# Patient Record
Sex: Female | Born: 1935 | Race: White | Hispanic: No
Health system: Southern US, Community
[De-identification: ages and names within clinical notes are randomized; demographics above are authoritative.]

## PROBLEM LIST (undated history)

## (undated) DIAGNOSIS — I4891 Unspecified atrial fibrillation: Secondary | ICD-10-CM

## (undated) DIAGNOSIS — R7309 Other abnormal glucose: Secondary | ICD-10-CM

## (undated) DIAGNOSIS — Q249 Congenital malformation of heart, unspecified: Secondary | ICD-10-CM

## (undated) DIAGNOSIS — I10 Essential (primary) hypertension: Secondary | ICD-10-CM

## (undated) DIAGNOSIS — Z9289 Personal history of other medical treatment: Secondary | ICD-10-CM

## (undated) DIAGNOSIS — C801 Malignant (primary) neoplasm, unspecified: Secondary | ICD-10-CM

## (undated) DIAGNOSIS — E871 Hypo-osmolality and hyponatremia: Secondary | ICD-10-CM

## (undated) DIAGNOSIS — M1990 Unspecified osteoarthritis, unspecified site: Secondary | ICD-10-CM

## (undated) DIAGNOSIS — D649 Anemia, unspecified: Secondary | ICD-10-CM

## (undated) DIAGNOSIS — H3530 Unspecified macular degeneration: Secondary | ICD-10-CM

## (undated) HISTORY — DX: Hypo-osmolality and hyponatremia: E87.1

## (undated) HISTORY — PX: FRACTURE SURGERY: SHX138

## (undated) HISTORY — DX: Malignant (primary) neoplasm, unspecified: C80.1

## (undated) HISTORY — DX: Other abnormal glucose: R73.09

## (undated) HISTORY — DX: Unspecified osteoarthritis, unspecified site: M19.90

## (undated) HISTORY — PX: APPENDECTOMY: SHX54

## (undated) HISTORY — PX: BLADDER TUMOR EXCISION: SHX238

## (undated) HISTORY — DX: Unspecified atrial fibrillation: I48.91

## (undated) HISTORY — DX: Essential (primary) hypertension: I10

## (undated) HISTORY — PX: TONSILLECTOMY: SUR1361

## (undated) HISTORY — PX: DILATION AND CURETTAGE OF UTERUS: SHX78

## (undated) HISTORY — PX: ABDOMINAL HYSTERECTOMY: SHX81

## (undated) HISTORY — PX: COLONOSCOPY: SHX174

## (undated) HISTORY — PX: CYSTOSCOPY: SUR368

## (undated) HISTORY — DX: Congenital malformation of heart, unspecified: Q24.9

---

## 1999-05-12 ENCOUNTER — Ambulatory Visit (HOSPITAL_COMMUNITY): Admission: RE | Admit: 1999-05-12 | Discharge: 1999-05-12 | Payer: Self-pay | Admitting: Internal Medicine

## 1999-05-12 ENCOUNTER — Encounter: Payer: Self-pay | Admitting: Internal Medicine

## 1999-05-19 ENCOUNTER — Encounter: Payer: Self-pay | Admitting: Internal Medicine

## 1999-05-19 ENCOUNTER — Ambulatory Visit (HOSPITAL_COMMUNITY): Admission: RE | Admit: 1999-05-19 | Discharge: 1999-05-19 | Payer: Self-pay | Admitting: Internal Medicine

## 2000-02-11 ENCOUNTER — Ambulatory Visit (HOSPITAL_COMMUNITY): Admission: RE | Admit: 2000-02-11 | Discharge: 2000-02-11 | Payer: Self-pay | Admitting: Orthopedic Surgery

## 2000-02-11 ENCOUNTER — Encounter: Payer: Self-pay | Admitting: Orthopedic Surgery

## 2002-11-21 ENCOUNTER — Encounter: Payer: Self-pay | Admitting: Internal Medicine

## 2002-11-21 ENCOUNTER — Encounter: Admission: RE | Admit: 2002-11-21 | Discharge: 2002-11-21 | Payer: Self-pay | Admitting: Internal Medicine

## 2004-09-03 ENCOUNTER — Ambulatory Visit: Payer: Self-pay | Admitting: Internal Medicine

## 2004-11-22 ENCOUNTER — Encounter: Admission: RE | Admit: 2004-11-22 | Discharge: 2004-11-22 | Payer: Self-pay | Admitting: Internal Medicine

## 2005-11-28 ENCOUNTER — Ambulatory Visit: Payer: Self-pay | Admitting: Internal Medicine

## 2006-02-15 ENCOUNTER — Ambulatory Visit: Payer: Self-pay | Admitting: Internal Medicine

## 2006-03-10 ENCOUNTER — Ambulatory Visit: Payer: Self-pay | Admitting: Internal Medicine

## 2006-07-04 ENCOUNTER — Ambulatory Visit: Payer: Self-pay | Admitting: Internal Medicine

## 2006-07-04 LAB — CONVERTED CEMR LAB
ALT: 19 units/L (ref 0–40)
Alkaline Phosphatase: 69 units/L (ref 39–117)
Microalb, Ur: 2.4 mg/dL — ABNORMAL HIGH (ref 0.0–1.9)
Total Bilirubin: 0.8 mg/dL (ref 0.3–1.2)
Total Protein: 6.6 g/dL (ref 6.0–8.3)

## 2007-01-26 ENCOUNTER — Ambulatory Visit: Payer: Self-pay | Admitting: Internal Medicine

## 2007-01-26 ENCOUNTER — Telehealth: Payer: Self-pay | Admitting: Internal Medicine

## 2007-01-29 ENCOUNTER — Encounter (INDEPENDENT_AMBULATORY_CARE_PROVIDER_SITE_OTHER): Payer: Self-pay | Admitting: *Deleted

## 2007-01-31 ENCOUNTER — Encounter: Admission: RE | Admit: 2007-01-31 | Discharge: 2007-01-31 | Payer: Self-pay | Admitting: Internal Medicine

## 2007-02-01 ENCOUNTER — Encounter (INDEPENDENT_AMBULATORY_CARE_PROVIDER_SITE_OTHER): Payer: Self-pay | Admitting: *Deleted

## 2007-02-08 ENCOUNTER — Ambulatory Visit: Payer: Self-pay | Admitting: Internal Medicine

## 2007-02-14 ENCOUNTER — Encounter (INDEPENDENT_AMBULATORY_CARE_PROVIDER_SITE_OTHER): Payer: Self-pay | Admitting: *Deleted

## 2007-02-15 ENCOUNTER — Ambulatory Visit: Payer: Self-pay | Admitting: Internal Medicine

## 2007-08-09 ENCOUNTER — Ambulatory Visit: Payer: Self-pay | Admitting: Internal Medicine

## 2007-08-09 ENCOUNTER — Inpatient Hospital Stay (HOSPITAL_COMMUNITY): Admission: EM | Admit: 2007-08-09 | Discharge: 2007-08-10 | Payer: Self-pay | Admitting: Emergency Medicine

## 2007-08-09 ENCOUNTER — Ambulatory Visit: Payer: Self-pay | Admitting: Cardiovascular Disease

## 2007-08-09 DIAGNOSIS — I472 Ventricular tachycardia, unspecified: Secondary | ICD-10-CM | POA: Insufficient documentation

## 2007-08-09 DIAGNOSIS — I10 Essential (primary) hypertension: Secondary | ICD-10-CM | POA: Insufficient documentation

## 2007-08-09 DIAGNOSIS — I480 Paroxysmal atrial fibrillation: Secondary | ICD-10-CM | POA: Insufficient documentation

## 2007-08-09 DIAGNOSIS — I4729 Other ventricular tachycardia: Secondary | ICD-10-CM | POA: Insufficient documentation

## 2007-08-28 ENCOUNTER — Ambulatory Visit: Payer: Self-pay | Admitting: Internal Medicine

## 2007-09-26 ENCOUNTER — Ambulatory Visit: Payer: Self-pay | Admitting: Cardiovascular Disease

## 2007-09-26 ENCOUNTER — Encounter: Payer: Self-pay | Admitting: Cardiovascular Disease

## 2007-09-26 ENCOUNTER — Ambulatory Visit: Payer: Self-pay

## 2007-11-05 ENCOUNTER — Encounter: Payer: Self-pay | Admitting: Internal Medicine

## 2008-01-21 ENCOUNTER — Ambulatory Visit: Payer: Self-pay | Admitting: Cardiovascular Disease

## 2008-05-27 ENCOUNTER — Encounter (INDEPENDENT_AMBULATORY_CARE_PROVIDER_SITE_OTHER): Payer: Self-pay | Admitting: *Deleted

## 2008-05-27 ENCOUNTER — Ambulatory Visit: Payer: Self-pay | Admitting: Internal Medicine

## 2008-05-27 DIAGNOSIS — IMO0002 Reserved for concepts with insufficient information to code with codable children: Secondary | ICD-10-CM | POA: Insufficient documentation

## 2008-06-06 ENCOUNTER — Encounter (INDEPENDENT_AMBULATORY_CARE_PROVIDER_SITE_OTHER): Payer: Self-pay | Admitting: *Deleted

## 2008-06-06 LAB — CONVERTED CEMR LAB
ALT: 25 units/L (ref 0–35)
AST: 22 units/L (ref 0–37)
Basophils Absolute: 0 10*3/uL (ref 0.0–0.1)
Bilirubin, Direct: 0.1 mg/dL (ref 0.0–0.3)
CO2: 31 meq/L (ref 19–32)
Calcium: 9.4 mg/dL (ref 8.4–10.5)
Chloride: 103 meq/L (ref 96–112)
Cholesterol: 200 mg/dL (ref 0–200)
GFR calc non Af Amer: 74 mL/min
HDL: 61.1 mg/dL (ref >39.0)
LDL Cholesterol: 122 mg/dL — ABNORMAL HIGH (ref 0–99)
Lymphocytes Relative: 22.9 % (ref 12.0–46.0)
MCHC: 34.1 g/dL (ref 30.0–36.0)
Neutrophils Relative %: 67.8 % (ref 43.0–77.0)
RDW: 12.7 % (ref 11.5–14.6)
Sodium: 141 meq/L (ref 135–145)
Total Bilirubin: 1 mg/dL (ref 0.3–1.2)
Triglycerides: 83 mg/dL (ref 0–149)
VLDL: 17 mg/dL (ref 0–40)
Vit D, 1,25-Dihydroxy: 51 (ref 30–89)

## 2008-06-10 ENCOUNTER — Encounter (INDEPENDENT_AMBULATORY_CARE_PROVIDER_SITE_OTHER): Payer: Self-pay | Admitting: *Deleted

## 2008-06-10 ENCOUNTER — Ambulatory Visit: Payer: Self-pay | Admitting: Internal Medicine

## 2008-06-10 LAB — CONVERTED CEMR LAB
OCCULT 2: NEGATIVE
OCCULT 3: NEGATIVE

## 2008-06-26 ENCOUNTER — Ambulatory Visit: Payer: Self-pay | Admitting: Internal Medicine

## 2008-06-26 DIAGNOSIS — R7303 Prediabetes: Secondary | ICD-10-CM

## 2008-06-26 DIAGNOSIS — E119 Type 2 diabetes mellitus without complications: Secondary | ICD-10-CM | POA: Insufficient documentation

## 2008-06-28 LAB — CONVERTED CEMR LAB: Hgb A1c MFr Bld: 6 % (ref 4.6–6.0)

## 2008-06-30 ENCOUNTER — Encounter (INDEPENDENT_AMBULATORY_CARE_PROVIDER_SITE_OTHER): Payer: Self-pay | Admitting: *Deleted

## 2008-09-03 ENCOUNTER — Ambulatory Visit: Payer: Self-pay | Admitting: Internal Medicine

## 2008-09-03 DIAGNOSIS — I7300 Raynaud's syndrome without gangrene: Secondary | ICD-10-CM | POA: Insufficient documentation

## 2008-09-09 ENCOUNTER — Telehealth (INDEPENDENT_AMBULATORY_CARE_PROVIDER_SITE_OTHER): Payer: Self-pay | Admitting: *Deleted

## 2008-09-10 ENCOUNTER — Ambulatory Visit: Payer: Self-pay | Admitting: Internal Medicine

## 2008-09-10 ENCOUNTER — Encounter (INDEPENDENT_AMBULATORY_CARE_PROVIDER_SITE_OTHER): Payer: Self-pay | Admitting: *Deleted

## 2008-09-10 LAB — CONVERTED CEMR LAB
Bilirubin Urine: NEGATIVE
Specific Gravity, Urine: <1.005
Urobilinogen, UA: 0.2
WBC Urine, dipstick: NEGATIVE

## 2008-09-11 ENCOUNTER — Encounter: Payer: Self-pay | Admitting: Internal Medicine

## 2008-09-17 ENCOUNTER — Telehealth (INDEPENDENT_AMBULATORY_CARE_PROVIDER_SITE_OTHER): Payer: Self-pay | Admitting: *Deleted

## 2008-09-23 ENCOUNTER — Encounter: Payer: Self-pay | Admitting: Internal Medicine

## 2008-10-01 ENCOUNTER — Encounter: Payer: Self-pay | Admitting: Internal Medicine

## 2008-10-06 ENCOUNTER — Encounter (INDEPENDENT_AMBULATORY_CARE_PROVIDER_SITE_OTHER): Payer: Self-pay | Admitting: Urology

## 2008-10-06 ENCOUNTER — Ambulatory Visit (HOSPITAL_BASED_OUTPATIENT_CLINIC_OR_DEPARTMENT_OTHER): Admission: RE | Admit: 2008-10-06 | Discharge: 2008-10-06 | Payer: Self-pay | Admitting: Urology

## 2008-10-14 ENCOUNTER — Encounter: Payer: Self-pay | Admitting: Internal Medicine

## 2008-11-13 ENCOUNTER — Encounter: Payer: Self-pay | Admitting: Internal Medicine

## 2009-01-13 ENCOUNTER — Encounter: Payer: Self-pay | Admitting: Internal Medicine

## 2009-01-23 ENCOUNTER — Telehealth (INDEPENDENT_AMBULATORY_CARE_PROVIDER_SITE_OTHER): Payer: Self-pay | Admitting: *Deleted

## 2009-01-23 ENCOUNTER — Encounter: Payer: Self-pay | Admitting: Internal Medicine

## 2009-01-23 DIAGNOSIS — Z780 Asymptomatic menopausal state: Secondary | ICD-10-CM | POA: Insufficient documentation

## 2009-02-02 ENCOUNTER — Encounter: Admission: RE | Admit: 2009-02-02 | Discharge: 2009-02-02 | Payer: Self-pay | Admitting: Internal Medicine

## 2009-02-02 ENCOUNTER — Encounter: Payer: Self-pay | Admitting: Internal Medicine

## 2009-02-04 ENCOUNTER — Encounter (INDEPENDENT_AMBULATORY_CARE_PROVIDER_SITE_OTHER): Payer: Self-pay | Admitting: *Deleted

## 2009-03-03 ENCOUNTER — Ambulatory Visit: Payer: Self-pay | Admitting: Internal Medicine

## 2009-03-08 LAB — CONVERTED CEMR LAB: Vit D, 25-Hydroxy: 34 ng/mL (ref 30–89)

## 2009-03-09 ENCOUNTER — Encounter (INDEPENDENT_AMBULATORY_CARE_PROVIDER_SITE_OTHER): Payer: Self-pay | Admitting: *Deleted

## 2009-03-10 ENCOUNTER — Ambulatory Visit: Payer: Self-pay | Admitting: Internal Medicine

## 2009-03-10 DIAGNOSIS — E559 Vitamin D deficiency, unspecified: Secondary | ICD-10-CM | POA: Insufficient documentation

## 2009-03-10 DIAGNOSIS — M810 Age-related osteoporosis without current pathological fracture: Secondary | ICD-10-CM | POA: Insufficient documentation

## 2009-03-10 DIAGNOSIS — Z8551 Personal history of malignant neoplasm of bladder: Secondary | ICD-10-CM | POA: Insufficient documentation

## 2009-04-21 ENCOUNTER — Encounter: Payer: Self-pay | Admitting: Internal Medicine

## 2009-07-20 ENCOUNTER — Encounter: Payer: Self-pay | Admitting: Internal Medicine

## 2009-10-20 ENCOUNTER — Encounter: Payer: Self-pay | Admitting: Internal Medicine

## 2009-12-03 ENCOUNTER — Encounter: Payer: Self-pay | Admitting: Internal Medicine

## 2010-01-25 ENCOUNTER — Encounter: Payer: Self-pay | Admitting: Internal Medicine

## 2010-02-05 ENCOUNTER — Encounter: Payer: Self-pay | Admitting: Internal Medicine

## 2010-02-05 ENCOUNTER — Ambulatory Visit (HOSPITAL_BASED_OUTPATIENT_CLINIC_OR_DEPARTMENT_OTHER): Admission: RE | Admit: 2010-02-05 | Discharge: 2010-02-05 | Payer: Self-pay | Admitting: Urology

## 2010-02-11 ENCOUNTER — Encounter: Payer: Self-pay | Admitting: Internal Medicine

## 2010-04-29 ENCOUNTER — Encounter: Payer: Self-pay | Admitting: Internal Medicine

## 2010-05-17 ENCOUNTER — Encounter: Payer: Self-pay | Admitting: Internal Medicine

## 2010-06-14 ENCOUNTER — Ambulatory Visit: Payer: Self-pay | Admitting: Internal Medicine

## 2010-06-14 DIAGNOSIS — H3530 Unspecified macular degeneration: Secondary | ICD-10-CM | POA: Insufficient documentation

## 2010-06-16 LAB — CONVERTED CEMR LAB
ALT: 21 units/L (ref 0–35)
AST: 21 units/L (ref 0–37)
BUN: 21 mg/dL (ref 6–23)
Basophils Relative: 0.4 % (ref 0.0–3.0)
Chloride: 104 meq/L (ref 96–112)
Direct LDL: 122.9 mg/dL
Eosinophils Relative: 2 % (ref 0.0–5.0)
GFR calc non Af Amer: 90.38 mL/min (ref >60)
HCT: 38.1 % (ref 36.0–46.0)
HDL: 72.9 mg/dL (ref >39.00)
Hemoglobin: 12.8 g/dL (ref 12.0–15.0)
Lymphs Abs: 1.3 10*3/uL (ref 0.7–4.0)
MCV: 97 fL (ref 78.0–100.0)
Monocytes Absolute: 0.4 10*3/uL (ref 0.1–1.0)
Monocytes Relative: 9.3 % (ref 3.0–12.0)
Neutro Abs: 2.9 10*3/uL (ref 1.4–7.7)
Potassium: 4.2 meq/L (ref 3.5–5.1)
RBC: 3.93 M/uL (ref 3.87–5.11)
Sodium: 143 meq/L (ref 135–145)
TSH: 1.94 microintl units/mL (ref 0.35–5.50)
Total Bilirubin: 0.5 mg/dL (ref 0.3–1.2)
Total CHOL/HDL Ratio: 3
Total Protein: 6.7 g/dL (ref 6.0–8.3)
VLDL: 12.2 mg/dL (ref 0.0–40.0)
WBC: 4.8 10*3/uL (ref 4.5–10.5)

## 2010-07-16 ENCOUNTER — Telehealth (INDEPENDENT_AMBULATORY_CARE_PROVIDER_SITE_OTHER): Payer: Self-pay | Admitting: *Deleted

## 2010-08-16 ENCOUNTER — Encounter: Payer: Self-pay | Admitting: Internal Medicine

## 2010-08-17 NOTE — Letter (Signed)
Summary: [facility]  [facility]   Imported By: Lanelle Bal 06/08/2010 09:48:16  _____________________________________________________________________  External Attachment:    Type:   Image     Comment:   External Document

## 2010-08-17 NOTE — Letter (Signed)
Summary: [facility]  [facility]   Imported By: Lanelle Bal 07/27/2009 09:51:49  _____________________________________________________________________  External Attachment:    Type:   Image     Comment:   External Document

## 2010-08-17 NOTE — Letter (Signed)
Summary: [facility]  [facility]   Imported By: Lanelle Bal 02/24/2010 08:04:43  _____________________________________________________________________  External Attachment:    Type:   Image     Comment:   External Document

## 2010-08-17 NOTE — Letter (Signed)
Summary: [facility]  [facility]   Imported By: Lanelle Bal 11/04/2009 10:09:57  _____________________________________________________________________  External Attachment:    Type:   Image     Comment:   External Document

## 2010-08-17 NOTE — Letter (Signed)
Summary: [facility]   Imported By: Lanelle Bal 05/11/2010 10:28:44  _____________________________________________________________________  External Attachment:    Type:   Image     Comment:   External Document

## 2010-08-17 NOTE — Assessment & Plan Note (Signed)
Summary: yearly check/cbs   Vital Signs:  Patient profile:   77 year old female Height:      64.5 inches Weight:      106 pounds BMI:     17.98 Temp:     97.7 degrees F oral Pulse rate:   64 / minute Resp:     14 per minute BP sitting:   140 / 72  (left arm) Cuff size:   regular  Vitals Entered By: Shonna Chock CMA (June 14, 2010 8:30 AM) CC: CPX with fasting labs , last EKG 01/2010 (prior to surgery)  Comments Patient states Flu and Shingles vaccine(s) up to date Patient will check her records for last pneumonia vaccine    CC:  CPX with fasting labs  and last EKG 01/2010 (prior to surgery) .  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 Referral Coordination: none requested ; [facility] referral discussed 12. Care Plan: see Instructions 13. Cognitive Assessment: Oriented X 3; memory & recall  good  ; "WORLD" spelled backwards; mood & affect normal. Hypertension Follow-Up : The patient denies lightheadedness, urinary frequency, headaches, and fatigue.  The patient denies the following associated symptoms: chest pain, chest pressure, exercise intolerance, dyspnea, palpitations, syncope, leg edema, and pedal edema.  Compliance with medications (by patient report) has been near 100%.  The patient reports that dietary compliance has been good.   Adjunctive measures currently used by the patient include  modified salt restriction.  BP not monitored @ home.   Preventive Screening-Counseling & Management  Alcohol-Tobacco     Alcohol drinks/day: 0     Smoking Status: never  Caffeine-Diet-Exercise     Caffeine use/day: 3 cups ;1 cup caffeinated /day  Hep-HIV-STD-Contraception     Dental Visit-last 6 months yes     Sun Exposure-Excessive: no  Safety-Violence-Falls     Seat Belt Use: yes     Smoke Detectors: yes      Blood Transfusions:  no.        Travel History:  never.    Current Medications (verified): 1)  Calcium 1200 With Vit D .... 1 By Mouth Two Times A Day 2)  Magnesium .Marland KitchenMarland Kitchen. 1 Daily 3)  Icaps  Caps (Multiple Vitamins-Minerals) .Marland Kitchen.. 1 By Mouth Two Times A Day 4)  Fish Oil 1 By Mouth Twice A Day 5)  Multi-Vitamin 1 Daily 6)  Metoprolol Tartrate 50 Mg  Tabs (Metoprolol Tartrate) .Marland Kitchen.. 1 Bid 7)  Amlodipine Besy-Benazepril Hcl 5-20 Mg Caps (Amlodipine Besy-Benazepril Hcl) .Marland Kitchen.. 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; MacularDegeneration  bilateral, wet in OS,Dr Hecke & Dr Luciana Axe; Bladder Cancer, low grade papillary , Dr Vernie Ammons, recurrence 2011 Osteopenia ( T score : -2.0),S/P Fosamax > 5 years Vitamin D deficiency  Past Surgical History: Appendectomy Colonoscopy negative   2005;Oophorectomy 1970s :cyst Hysterectomy & Oophorectomy for incidental  cyst  in 1970s( no PMH of  abnormal PAP smears) Tonsillectomy S/P TUR-BT for Transitiona Cell Bladder Cancer 09/2008; repeat surgery 01/2010, Dr Vernie Ammons  Family History: Father: MI in 35s Mother: CVA in 37s Siblings: bro: CABG @ 50; bro: DM  Social History: Never Smoked Alcohol use-no Regular exercise-yes Caffeine use/day:  3 cups ;1 cup caffeinated /day Dental Care w/in 6 mos.:  yes Sun Exposure-Excessive:   no Seat Belt Use:  yes Blood Transfusions:  no  Review of Systems  The patient denies anorexia, fever, weight loss, weight gain, hoarseness, prolonged cough, hemoptysis, abdominal pain, melena, hematochezia, severe indigestion/heartburn, hematuria, suspicious skin lesions, unusual weight change, abnormal bleeding, enlarged lymph nodes, and angioedema.         She questions whether ACE-I is causing "dry mouth"; no cough or angioedema reported  Physical Exam  General:  Thin, appears younger than age; alert,appropriate and cooperative throughout examination Head:  Normocephalic and atraumatic without obvious abnormalities.  Eyes:  No corneal or conjunctival inflammation noted. Perrla. Funduscopic exam benign, without hemorrhages, exudates or papilledema.  Ears:  External ear exam shows no significant lesions or deformities.  Otoscopic examination reveals  some wax on R Nose:  External nasal examination shows no deformity or inflammation. Nasal mucosa are pink and moist without lesions or exudates. Mouth:  Oral mucosa and oropharynx without lesions or exudates.  Teeth in good repair. Neck:  No deformities, masses, or tenderness noted. Lungs:  Normal respiratory effort, chest expands symmetrically. Lungs are clear to auscultation, no crackles or wheezes. Heart:  Normal rate and regular rhythm. S1  increased; no  gallop, murmur, click, rub . Three beat run of premature beats Abdomen:  Bowel sounds positive,abdomen soft and non-tender without masses, organomegaly or hernias noted. Aorta palpable , w/o AAA Genitalia:  S/P TAH & BSO Msk:  Lordosis lower thoracic / upper LS spine. Scoliosis thoracic spine  Pulses:  R and L carotid,radial,dorsalis pedis  pulses are full and equal bilaterally. Decreased PTP Extremities:  No clubbing, cyanosis, edema. DIP OA changes  noted with normal full range of motion of all joints.  Large Osteoma R dorsal  foot Neurologic:  alert & oriented X3 and DTRs symmetrical  and normal.   Skin:  Intact without suspicious lesions or rashes Cervical Nodes:  No lymphadenopathy noted Axillary Nodes:  No palpable lymphadenopathy Psych:  memory intact for recent and remote, normally interactive, and good eye contact.     Impression & Recommendations:  Problem # 1:  PREVENTIVE HEALTH CARE (ICD-V70.0)  Orders: [facility] -Subsequent Annual Wellness Visit [phone])  Problem # 2:  HYPERTENSION (ICD-401.9) Dry mouth ? due to ACE-I Her updated medication list for this problem includes:    Metoprolol Tartrate 50 Mg Tabs (Metoprolol tartrate) .Marland Kitchen... 1 bid    Amlodipine Besy-benazepril Hcl 5-20 Mg Caps (Amlodipine besy-benazepril hcl) .Marland Kitchen... 1 once daily in place of lotensin(benazepril)  Orders: EKG w/ Interpretation (93000) Venipuncture (62952) TLB-Lipid Panel (80061-LIPID) TLB-BMP (Basic Metabolic Panel-BMET) (80048-METABOL) TLB-Hepatic/Liver Function Pnl (80076-HEPATIC)  Problem # 3:  OSTEOPENIA (ICD-733.90)  Orders: Venipuncture (84132) T-Vitamin D (25-Hydroxy) (44010-27253) Specimen Handling (66440) TLB-BMP (Basic Metabolic Panel-BMET) (80048-METABOL) TLB-TSH (Thyroid Stimulating Hormone) (84443-TSH)  Problem # 4:  UNSPECIFIED VITAMIN D DEFICIENCY (ICD-268.9)  Orders: Venipuncture (34742) T-Vitamin D (25-Hydroxy) (59563-87564)  Problem # 5:  NEOPLASM, MALIGNANT, BLADDER, HX OF (ICD-V10.51)  as per Dr Vernie Ammons  Orders: Specimen Handling (33295) TLB-CBC Platelet -  w/Differential (85025-CBCD)  Problem # 6:  PAROXYSMAL VENTRICULAR TACHYCARDIA (ICD-427.1) PMH of Her updated medication list for this problem includes:    Metoprolol Tartrate 50 Mg Tabs (Metoprolol tartrate) .Marland Kitchen... 1 bid  Orders: Specimen Handling (54098) TLB-BMP (Basic Metabolic Panel-BMET) (80048-METABOL) TLB-TSH (Thyroid Stimulating Hormone) (84443-TSH)  Complete Medication List: 1)  Calcium 1200 With Vit D  .... 1 by mouth two times a day 2)  Magnesium  .Marland Kitchen.. 1 daily 3)  Icaps Caps  (Multiple vitamins-minerals) .Marland Kitchen.. 1 by mouth two times a day 4)  Fish Oil 1 By Mouth Twice A Day  5)  Multi-vitamin 1 Daily  6)  Metoprolol Tartrate 50 Mg Tabs (Metoprolol tartrate) .Marland Kitchen.. 1 bid 7)  Amlodipine Besy-benazepril Hcl 5-20 Mg Caps (Amlodipine besy-benazepril hcl) .Marland Kitchen.. 1 once daily in place of lotensin(benazepril)  Patient Instructions: 1)  If mouth symptoms persist or progress, Benazeparil can be changed to generic Losartan. Report any lip or tongue swelling. Consider Audiology referral. 2)  Check your Blood Pressure regularly. Your goal = AVERAGE < 135/85. 3)  Monthly self breast exams; schedule your mammogram @ least evry 2 years. Prescriptions: AMLODIPINE BESY-BENAZEPRIL HCL 5-20 MG CAPS (AMLODIPINE BESY-BENAZEPRIL HCL) 1 once daily in place of Lotensin(Benazepril)  #90 Capsule x 3   Entered and Authorized by:   Marga Melnick MD   Signed by:   Marga Melnick MD on 06/14/2010   Method used:   Print then Give to Patient   RxID:   [id number]    Orders Added: 1)  [facility] -Subsequent Annual Wellness Visit [G0439] 2)  EKG w/ Interpretation [93000] 3)  Venipuncture [36415] 4)  T-Vitamin D (25-Hydroxy) [65784-69629] 5)  Est. Patient Level III [52841] 6)  Specimen Handling [99000] 7)  TLB-Lipid Panel [80061-LIPID] 8)  TLB-BMP (Basic Metabolic Panel-BMET) [80048-METABOL] 9)  TLB-CBC Platelet - w/Differential [85025-CBCD] 10)  TLB-Hepatic/Liver Function Pnl [80076-HEPATIC] 11)  TLB-TSH (Thyroid Stimulating Hormone) [32440-NUU]   Immunization History:  Tetanus/Td Immunization History:    Tetanus/Td:  historical (07/19/2003)   Immunization History:  Tetanus/Td Immunization History:    Tetanus/Td:  Historical (07/19/2003)

## 2010-08-17 NOTE — Letter (Signed)
Summary: [facility]  [facility]   Imported By: Lanelle Bal 02/09/2010 09:15:48  _____________________________________________________________________  External Attachment:    Type:   Image     Comment:   External Document

## 2010-08-19 NOTE — Progress Notes (Signed)
Summary: Update on Pneumonia Vaccine  Phone Note Call from Patient Call back at Home Phone [phone]   Caller: Patient Summary of Call: Message left on voicemail with the date of pneumonia vaccine 03/12/2005, patient would like this noted on her chart./[facility] CMA  July 16, 2010 2:40 PM       Immunization History:  Pneumovax Immunization History:    Pneumovax:  pneumovax (03/12/2005)

## 2010-09-02 NOTE — Letter (Signed)
Summary: [facility]  [facility]   Imported By: Maryln Gottron 08/24/2010 10:12:35  _____________________________________________________________________  External Attachment:    Type:   Image     Comment:   External Document

## 2010-10-02 LAB — POCT I-STAT 4, (NA,K, GLUC, HGB,HCT)
Glucose, Bld: 123 mg/dL — ABNORMAL HIGH (ref 70–99)
HCT: 43 % (ref 36.0–46.0)
Hemoglobin: 14.6 g/dL (ref 12.0–15.0)
Potassium: 4.5 mEq/L (ref 3.5–5.1)
Sodium: 139 mEq/L (ref 135–145)

## 2010-10-28 LAB — POCT I-STAT, CHEM 8
BUN: 19 mg/dL (ref 6–23)
Calcium, Ion: 1.21 mmol/L (ref 1.12–1.32)
Chloride: 105 mEq/L (ref 96–112)
Creatinine, Ser: 0.8 mg/dL (ref 0.4–1.2)
Glucose, Bld: 126 mg/dL — ABNORMAL HIGH (ref 70–99)
HCT: 42 % (ref 36.0–46.0)
Hemoglobin: 14.3 g/dL (ref 12.0–15.0)
Potassium: 3.8 mEq/L (ref 3.5–5.1)
Sodium: 142 mEq/L (ref 135–145)
TCO2: 29 mmol/L (ref 0–100)

## 2010-11-30 ENCOUNTER — Other Ambulatory Visit: Payer: Self-pay | Admitting: Internal Medicine

## 2010-11-30 NOTE — Discharge Summary (Signed)
NAMESAMAYA, [name]                ACCOUNT NO.:  [id number]   MEDICAL RECORD NO.:  [id number]          PATIENT TYPE:  INP   LOCATION:  6524                         FACILITY:  [facility]   PHYSICIAN:  Peter C. Eden Emms, MD, FACCDATE OF BIRTH:  04-Jan-1930   DATE OF ADMISSION:  08/09/2007  DATE OF DISCHARGE:  08/10/2007                         DISCHARGE SUMMARY - REFERRING   BRIEF HISTORY:  [name] is a 77 year old female who presented to Dr.  Frederik Pear office concerned about her blood pressure, that she had become  recently confused in regards to which medications she was supposed to be  taking.  However, in the office she was complaining of a 2-hour history  of palpitations but denied any associated symptoms such as chest  discomfort, shortness of breath or near syncope.   PAST MEDICAL HISTORY:  Notable for:  1. Hypertension.  2. Macular degeneration.   After being evaluated in the office she was admitted to [facility] with atrial  fibrillation.  Dr. Eden Moreno was asked to consult.  However, upon arrival  to [facility] she was in normal sinus rhythm.   LABORATORY:  Admission H&H was 13.9 and 41.5, normal indices, platelets  310, WBC 6.9.  Sodium 142, potassium 3.7, BUN 16, creatinine 0.68,  glucose 109.  CK-MB and troponin were within normal limits.  TSH 3.333,  free T4 1.26, free T3 2.8.   HOSPITAL COURSE:  [name] was admitted to [facility], unfortunately  she was admitted to [facility] service not [facility].  This was rectified on the morning of January 23.  However, after  consulting and evaluating [name] on January 23 she remained in  sinus rhythm.  Given her TSH was within normal limits Dr. Eden Moreno spoke  to her son who happens to be a physician assistant and felt that the  patient could be discharged home.  These findings were discussed with  Dr. Felicity Coyer, and it was felt that Dr. Eden Moreno could discharge her home.   DISCHARGE DIAGNOSES:  1. Paroxysmal  atrial fibrillation with a spontaneous conversion to      normal sinus rhythm prior to reaching the hospital.  2. Hypertension.  3. History of osteoporosis.  4. Macular degeneration.   DISPOSITION:  The patient is discharged home on August 10, 2007.   Activity, wound care are not restricted or applicable.   1. Her Toprol was increased to 50 mg daily.  2. She was asked to continue her Lotensin 10 mg daily.  3. Magnesium 400 mg daily.  4. Calcium 1200 mg b.i.d.  5. Multivitamin with iron daily.  6. Eye vitamins as previously.  7. Fish oil daily.  8. Aspirin 81 mg daily.   She was asked to continue to maintain her blood pressure and pulse diary  and to bring all medicines and her diary to all appointments.  She will  have a followup with echocardiogram and an appointment with Dr. Eden Moreno  in approximately 8 weeks.  This appointment will be made for her prior  to her discharge when the office opens.  She will follow up with Dr.  Hopper and Dr. Elmer Picker as scheduled.   DISCHARGE TIME:  Twenty-five minutes.      Joellyn Rued, PA-C      Janet Pick. Eden Emms, MD, [facility]  Electronically Signed    EW/MEDQ  D:  08/10/2007  T:  08/10/2007  Job:  [id number]   cc:   Peyton Najjar, MD  Janet Pick Eden Emms, MD, [facility]  Delon Sacramento, M.D.

## 2010-11-30 NOTE — Assessment & Plan Note (Signed)
[facility]                            CARDIOLOGY OFFICE NOTE   NAME:FARABEEJasimine, [name]                       MRN:          [id number]  DATE:09/26/2007                            DOB:          Jul 18, 1930    [name] is seen today in followup.  She was a consult we saw in the  hospital briefly for an episode of PAF.  She has hypertension.  She has  been doing well.  She has kept a good blood pressure log at home.  Her  blood pressure is running kind of high.  [name] had increased her  beta-blocker to metoprolol 50 b.i.d. and her benazepril to 20 a day.  Her blood pressure readings are much improved over the last 4 weeks.  However, she still is running systolics of 130-140.  I told her we would  probably call in a prescription on [name] to [facility] for  benazepril 40 a day.  She has been trying the watch the salt in her  diet.  She is not having significant chest pain or dyspnea.  She has not  had any evidence of recurrence of PAF.  She is currently not on  Coumadin.   REVIEW OF SYSTEMS:  Otherwise negative.  She seems to worry a lot.  She  works out at [employer] and is doing extremely well for a patient of her age.  She had a 2D echocardiogram, which I personally reviewed.  Interestingly, her septal thickness was only 8 mm.  Her EF was 55-60%  with no significant valvular disease and just trivial MR and little  aortic sclerosis.   Meds: Bensipril 40mg , ASA, calcium Lopresser 50 bid   PHYSICAL EXAMINATION:  Current exam is remarkable for a blood pressure  of 148/88, pulse is 62 and regular, afebrile, respiratory rate 14.  GENERAL:  Affect appropriate.  HEENT:  Unremarkable.  NECK:  Carotids are without bruit, no lymphadenopathy, thyromegaly, or  JVP elevation.  LUNGS:  Clear with diaphragmatic motion.  No wheezing.  S1, S2 with an  S4 gallop.  Soft systolic murmur.  PMI normal.  ABDOMEN:  Benign.  Bowel sounds positive.  No  hepatosplenomegaly or  hepatojugular reflux.  MUSCULOSKELETAL:  Pulse intact, no edema.  NEURO:  Nonfocal.  SKIN:  Warm and dry.  No muscular weakness.   EKG does show evidence of LVH despite not seeing it on echo.   IMPRESSION:  1. Hypertension.  Increase benazepril to 40 a day, low-salt diet.      Follow with [name].  2. Paroxysmal atrial fibrillation but currently maintaining sinus      rhythm.  Continue aspirin, no indication for Coumadin.  3. Osteoporosis.  Continue physical activity and calcium replacement.   The patient will follow with [name] for her general medical needs.  She will call us if she has any evidence of recurrence of her A fib.  I  will see her back in 6 months.     [name], MD, [facility]  Electronically Signed    PCN/MedQ  DD: 09/26/2007  DT: 09/27/2007  Job #: [id number]

## 2010-11-30 NOTE — Consult Note (Signed)
[name], DUE                ACCOUNT NO.:  [id number]   MEDICAL RECORD NO.:  [id number]          PATIENT TYPE:  EMS   LOCATION:  MAJO                         FACILITY:  [facility]   PHYSICIAN:  [name], ANP DATE OF BIRTH:  1929-08-29   DATE OF CONSULTATION:  08/09/2007  DATE OF DISCHARGE:                                 CONSULTATION   Primary cardiologist previously seeing Carole Binning, M.D. [facility], but  now seeing [name], MD, [facility]   PRIMARY CARE PHYSICIAN:  Titus Dubin. Alwyn Ren, MD,FACP,FCCP.   PATIENT PROFILE:  A 77 year old Caucasian female with a prior history of  palpitations and documented history of paroxysmal nonsustained  ventricular tachycardia who presents with atrial fibrillation.   PROBLEM LIST:  1. Atrial fibrillation with rapid ventricular response.  2. Hypertension.  3. Reported history of paroxysmal nonsustained ventricular      tachycardia.  4. Osteoporosis.  5. Macular degeneration.   HISTORY OF PRESENT ILLNESS:  With prior history of palpitations and  reported history of paroxysmal nonsustained ventricular tachycardia  previously seen by Dr. Gerri Spore in the past.  She is very active at  home exercising on a regular basis without any significant limitations.  She was in her usual state of health today when she had acute onset of  tachy palpitations at approximately 11 a.m. with mild lightheadedness  which she describes as a visual disturbance.  She denied any chest pain  or shortness of breath.  Approximately two hours after onset of  symptoms, she presented to Dr. Frederik Pear office for a 1:15 visit and ECG  showed atrial fibrillation with RVR at a rate of 144 beats per minute.  She was then transported to [facility] for further evaluation.  Following arrival to the ER prior to any initiation of AV nodal blocking  agents, the patient spontaneously converted from atrial fibrillation to  sinus rhythm.  She is currently  asymptomatic.   Of note, the patient had previously been taking Toprol therapy, however,  was recently initiated on Lotensin for poorly controlled hypertension  and she therefore stopped her Toprol not realizing she was supposed to  be taking both.   ALLERGIES:  No known drug allergies.   MEDICATIONS:  1. Lotensin 10 mg daily.  2. Boniva 150 mg q.month.  3. Magnesium one daily.  4. Toprol XL 25 mg daily (not currently taking).  5. Calcium 1200 mg b.i.d.  6. PreserVision eye vitamin daily.   FAMILY HISTORY:  Mother died of a CVA.  Father died of an MI.  He has a  brother who has a history of CABG and diabetes.   SOCIAL HISTORY:  She lives in [place] by herself.  She is widowed.  She is retired.  She has one son who is a Advice worker.  She is  very active and exercises regularly.  She adheres to a low fat diet.   REVIEW OF SYSTEMS:  Positive for tachypalpitations in the setting of  atrial fibrillation which is now resolved.  She also had some mild  lightheadedness.  Otherwise all systems are reviewed and  negative.   PHYSICAL EXAMINATION:  VITAL SIGNS:  She is afebrile, heart rate 81,  respirations 9, blood pressure 173/82, pulse oximetry 99% on room air.  GENERAL:  Pleasant white female in no acute distress.  Awake, alert and  oriented x3.  NECK:  No bruits or JVD.  LUNGS:  Respirations regular unlabored.  Clear to auscultation.  HEART:  Regular S1 and S2.  No S3 and S4 or murmurs.  ABDOMEN:  Round, soft, nontender, and nondistended.  Bowel sounds  present x4.  EXTREMITIES:  Warm, dry, and pink.  No cyanosis, clubbing, or edema.  Dorsalis pedis and posterior tibial pulses are 2+ and equal bilaterally.   EKG; when she was in atrial fibrillation she was going at 144 beats per  minute and she did have some ST depression in V5 and V6.  Currently she  is in sinus rhythm, rate at 87 beats per minute.  No acute ST or T  changes.  She does have poor R wave progression with  LVH.   ASSESSMENT:  1. Atrial fibrillation with rapid ventricular response.  The patient      spontaneously converted.  She had previously been on Toprol therapy      and we will go ahead and resume this at 50 mg daily.  We will add      aspirin 325 mg daily.  Her [place] score is 2.  If down the road she      has recurrent atrial fibrillation, we will have a low threshold to      initiate Coumadin therapy.  I will check BMET, magnesium, as well      as thyroid functions.  Provided that she has no recurrent atrial      fibrillation and lab work is within normal limits, she can be      discharged home in the a.m.  We will plan to set her up for two-      dimensional echocardiogram as an outpatient.  2. Hypertension, poorly controlled.  Continue Lotensin, add beta      blocker, and titrate both as necessary.      [name], ANP     CB/MEDQ  D:  08/09/2007  T:  08/09/2007  Job:  [id number]

## 2010-11-30 NOTE — Op Note (Signed)
[name], [name]                ACCOUNT NO.:  [id number]   MEDICAL RECORD NO.:  [id number]          PATIENT TYPE:  AMB   LOCATION:  [facility]                         FACILITY:  [facility]   PHYSICIAN:  Mark C. Vernie Ammons, M.D.  DATE OF BIRTH:  1929/12/16   DATE OF PROCEDURE:  10/06/2008  DATE OF DISCHARGE:                               OPERATIVE REPORT   PREOPERATIVE DIAGNOSIS:  Bladder tumor.   POSTOPERATIVE DIAGNOSIS:  Bladder tumor. (Clinical stage Ta)   PROCEDURE:  Transurethral resection of bladder tumor. (~49mm.)   SURGEON:  Mark C. Vernie Ammons, M.D.   ANESTHESIA:  General.   BLOOD LOSS:  Zero.   DRAINS:  None.   SPECIMENS:  Bladder tumor was sent to pathology.   COMPLICATIONS:  None.   INDICATIONS:  The patient is a 77 year old female who had 2 episodes of  gross painless hematuria.  She underwent full workup which revealed  normal upper tracts and a cytology that did reveal some atypia.  Her CT  scan did reveal what appeared to be a filling defect in the bladder and  this was confirmed cystoscopically to be a bladder tumor located on the  right wall posterior to the ureteral orifice on the right-hand side.  I  have discussed transurethral resection with her in detail and gone over  the risks and complications.  She understands and has elected to  proceed.   DESCRIPTION OF OPERATION:  After informed consent, the patient was  brought to the major OR and placed on the table administered, general  anesthesia then moved to the dorsal lithotomy position.  Her genitalia  was sterilely prepped and draped and an official time-out was then  performed.   Initially the urethra was dilated gently with female sounds from 20 to  64 [place].  I then passed the 26 French resectoscope sheath with  Timberlake obturator into the bladder without difficulty.  The obturator  was removed and the resectoscope element with 12 degrees lens was  inserted.  I did a systematic search of the bladder and  noted the  ureteral orifices were of normal configuration and position.  The  bladder tumor that I previously identified was again noted to be near  the posterior wall on the right-hand side well away from the right  ureteral orifice.  No other tumor, stones or inflammatory lesions were  identified within the bladder.   I then resected the bladder tumor without difficulty and then fulgurated  the surrounding mucosa as well as the base where the bladder tumor had  been removed.  I then removed the resected portions of the bladder tumor  and these were sent to pathology.  The bladder was then fully drained,  the resectoscope was removed and the patient was awakened and taken to  recovery room in stable and satisfactory condition.  She had received  Cipro I.V. preoperatively and postoperatively was administered a B & O  suppository.   She will be given a prescription for 18 Vicodin tablets, 200 mg Pyridium  #24 and 250 mg Cipro b.i.d. for 3 days.  Written instructions were  given  to her upon discharge and she will follow-up with me in the office on  October 14, 2008.      Mark C. Vernie Ammons, M.D.  Electronically Signed     MCO/MEDQ  D:  10/06/2008  T:  10/06/2008  Job:  [id number]

## 2010-11-30 NOTE — Assessment & Plan Note (Signed)
[facility]                            CARDIOLOGY OFFICE NOTE   NAME:[name], [name]                       MRN:          [id number]  DATE:01/21/2008                            DOB:          1929/10/03    HISTORY:  [name] returns today for followup.  She has had  hypertension and PAF.  She is on aspirin.  She has not had any had TIAs  or CVAs.  She has had no recurrent palpitations.  She prefers to take  her metoprolol and benazepril b.i.d. and her blood pressure seems to be  running reasonably well at home.  She has a bit of a [place]  Syndrome.   REVIEW OF SYSTEMS:  Otherwise negative.   CURRENT MEDICATIONS:  1. Multivitamins.  2. Baby aspirin.  3. Magnesium.  4. Benazepril 20 b.i.d.  5. Metoprolol 50 b.i.d.  6. Boniva.  7. Fish oil.  8. Calcium.   PHYSICAL EXAMINATION:  GENERAL:  Remarkable for somewhat anxious white  female, in no distress.  VITAL SIGNS:  Blood pressure is 130/80, pulse 70 regular, respiratory  rate 14, and afebrile.  HEENT:  Unremarkable.  Carotids are normal without bruit.  No  lymphadenopathy, no thyromegaly, or JVP elevation.  LUNGS:  Clear.  Good diaphragmatic motion.  No wheezing.  S1 and S2 with  no S4 gallop.  PMI normal.  ABDOMEN:  Benign.  Bowel sounds positive, no AAA, no bruit, no  tenderness, no hepatosplenomegaly, and no hepatojugular reflux.  EXTREMITIES:  Distal pulses intact.  No edema.  NEURO:  Nonfocal.  SKIN:  Warm and dry.  MUSCULOSKELETAL:  No muscular weakness.   Echo from March 2009 showed no septal hypertrophy with a septal  thickness of 7-8 mm.   IMPRESSION:  1. Hypertension, currently well controlled.  Continue metoprolol and      benazepril.  2. History of paroxysmal atrial fibrillation, maintaining sinus      rhythm.  No indication for Coumadin.  Continue baby aspirin.  3. Osteoporosis.  Continue calcium supplementation.   The patient does not need to be seen here, but on a  yearly basis she  will follow with [name] for her general medical needs.     [name]. Eden Emms, MD, [facility]  Electronically Signed    PCN/MedQ  DD: 01/21/2008  DT: 01/22/2008  Job #: [id number]

## 2010-12-31 ENCOUNTER — Encounter: Payer: Self-pay | Admitting: Internal Medicine

## 2011-04-08 LAB — CK TOTAL AND CKMB (NOT AT ARMC)
CK, MB: 2.1
Relative Index: INVALID
Total CK: 69

## 2011-04-08 LAB — BASIC METABOLIC PANEL WITH GFR
BUN: 16
CO2: 28
Calcium: 9.4
Chloride: 107
Creatinine, Ser: 0.68
GFR calc non Af Amer: >60
Glucose, Bld: 109 — ABNORMAL HIGH
Potassium: 3.7
Sodium: 142

## 2011-04-08 LAB — CBC
HCT: 41.5
Hemoglobin: 13.9
MCHC: 33.4
MCV: 94.7
Platelets: 310
RBC: 4.39
RDW: 13.2
WBC: 6.9

## 2011-04-08 LAB — T3, FREE: T3, Free: 2.8 (ref 2.3–4.2)

## 2011-04-08 LAB — TROPONIN I

## 2011-04-08 LAB — T4, FREE: Free T4: 1.26

## 2011-04-08 LAB — DIFFERENTIAL
Basophils Absolute: 0.1
Basophils Relative: 1
Lymphocytes Relative: 22
Neutro Abs: 4.8
Neutrophils Relative %: 70

## 2011-05-25 ENCOUNTER — Other Ambulatory Visit: Payer: Self-pay | Admitting: Internal Medicine

## 2011-05-25 MED ORDER — METOPROLOL TARTRATE 50 MG PO TABS: ORAL_TABLET | ORAL | Status: DC

## 2011-05-25 NOTE — Telephone Encounter (Signed)
CPX due  

## 2011-08-23 ENCOUNTER — Other Ambulatory Visit: Payer: Self-pay | Admitting: Internal Medicine

## 2011-08-23 NOTE — Telephone Encounter (Signed)
When patient calls for appointment she needs to scPlease schedule fasting Labs : free Testosterone,lipids, PSA. (Codes: Low serum testosterone level, elevated triglycerides & , 995.20, potential adverse drug effect) Please take these instructions to that Lab appt.   heudle a CPX

## 2011-11-21 ENCOUNTER — Other Ambulatory Visit: Payer: Self-pay | Admitting: Internal Medicine

## 2011-12-15 ENCOUNTER — Ambulatory Visit (INDEPENDENT_AMBULATORY_CARE_PROVIDER_SITE_OTHER): Payer: Medicare Other | Admitting: Internal Medicine

## 2011-12-15 ENCOUNTER — Encounter: Payer: Self-pay | Admitting: Internal Medicine

## 2011-12-15 VITALS — BP 120/68 | HR 66 | Temp 98.2°F | Resp 12 | Ht 65.0 in | Wt 103.8 lb

## 2011-12-15 DIAGNOSIS — M899 Disorder of bone, unspecified: Secondary | ICD-10-CM

## 2011-12-15 DIAGNOSIS — E559 Vitamin D deficiency, unspecified: Secondary | ICD-10-CM

## 2011-12-15 DIAGNOSIS — M949 Disorder of cartilage, unspecified: Secondary | ICD-10-CM

## 2011-12-15 DIAGNOSIS — Z8551 Personal history of malignant neoplasm of bladder: Secondary | ICD-10-CM

## 2011-12-15 DIAGNOSIS — Z0000 Encounter for general adult medical examination without abnormal findings: Secondary | ICD-10-CM

## 2011-12-15 DIAGNOSIS — R7309 Other abnormal glucose: Secondary | ICD-10-CM

## 2011-12-15 DIAGNOSIS — I4891 Unspecified atrial fibrillation: Secondary | ICD-10-CM

## 2011-12-15 DIAGNOSIS — I10 Essential (primary) hypertension: Secondary | ICD-10-CM

## 2011-12-15 DIAGNOSIS — M545 Low back pain, unspecified: Secondary | ICD-10-CM

## 2011-12-15 MED ORDER — TRAMADOL HCL 50 MG PO TABS: ORAL_TABLET | ORAL | Status: DC

## 2011-12-15 MED ORDER — AMLODIPINE BESYLATE 5 MG PO TABS: 5.0000 mg | ORAL_TABLET | Freq: Every day | ORAL | Status: DC

## 2011-12-15 MED ORDER — METOPROLOL TARTRATE 50 MG PO TABS: 50.0000 mg | ORAL_TABLET | Freq: Two times a day (BID) | ORAL | Status: DC

## 2011-12-15 MED ORDER — BENAZEPRIL HCL 20 MG PO TABS: 20.0000 mg | ORAL_TABLET | Freq: Every day | ORAL | Status: DC

## 2011-12-15 NOTE — Patient Instructions (Signed)
Preventive Health Care: Exercise  30-45  minutes a day, 3-4 days a week. Walking is especially valuable in preventing Osteoporosis. Eat a low-fat diet with lots of fruits and vegetables, up to 7-9 servings per day.   Blood Pressure Goal  Ideally is an AVERAGE < 135/85. This AVERAGE should be calculated from @ least 5-7 BP readings taken @ different times of day on different days of week. You should not respond to isolated BP readings , but rather the AVERAGE for that week   Please try to go on My Chart within the next 24 hours to allow me to release the results directly to you.

## 2011-12-15 NOTE — Progress Notes (Signed)
Subjective:    Patient ID: [name], female    DOB: 24-Apr-1930, 78 y.o.   MRN: [id number]  HPI Medicare Wellness Visit:  The following psychosocial & medical history were reviewed as required by Medicare.   Social history: caffeine: 1/2 cup coffee per day , alcohol:  no ,  tobacco use : no  & exercise : walking on treadmill 2 miles, stationary bike and machine weights.   Home & personal  safety / fall risk: no, activities of daily living: no limitations , seatbelt use : yes , and smoke alarm employment : yes .  Power of Attorney/Living Will status : yes  Vision ( as recorded per Nurse) & Hearing  evaluation :  denies. Orientation : yes , memory & recall : yes, spelling or math testing: yes,and mood & affect : normal . Depression / anxiety: no Travel history : none , immunization status : up to date , transfusion history:  none, and preventive health surveillance ( colonoscopies, BMD , etc as per protocol/ [facility]): , Dental care:  Seen yearly . Chart reviewed &  Updated. Active issues reviewed & addressed.       Review of Systems HYPERTENSION  Disease Monitoring: Blood pressure range- 84 ( X 1)-139/44-65 over past 2 weeks  Chest pain, palpitations- no       Dyspnea- no Medications: Compliance- yes  Lightheadedness,Syncope- no    Edema- no   FASTING HYPERGLYCEMIA, PMH of:  Disease Monitoring: Blood Sugar ranges- does not check   Polyuria/phagia/dipsia- no       Visual problems- no  Medications: Compliance- n/a  Hypoglycemic symptoms- n/a    Abd pain, bowel changes- no   Muscle aches-4/10 burning low back when bending over, Tylenol not helpful, relieved with rest . This is related to repetitive activity. She has no associated numbness or tingling, weakness in the legs, or stool or urinary incontinence          Objective:   Physical Exam Gen.: Thin but healthy and well-nourished in appearance. Alert, appropriate and cooperative throughout exam. Appears younger than  stated age  Head: Normocephalic without obvious abnormalities  Eyes: No corneal or conjunctival inflammation noted. Pupils equal round reactive to light and accommodation.  Extraocular motion intact. Vision grossly decreased.Arcus Ears: External  ear exam reveals no significant lesions or deformities. Canals clear .TMs normal. Hearing is grossly decreased bilaterally. Nose: External nasal exam reveals no deformity or inflammation. Nasal mucosa are pink and moist. No lesions or exudates noted.  Mouth: Oral mucosa and oropharynx reveal no lesions or exudates. Teeth in good repair. Neck: No deformities, masses, or tenderness noted. Range of motion & Thyroid normal. Lungs: Normal respiratory effort; chest expands symmetrically. Lungs are clear to auscultation without rales, wheezes, or increased work of breathing. Heart: Normal rate and rhythm. Normal S1 ;accentuated  S2. No gallop, click, or rub. S4 w/o  murmur. Abdomen: Bowel sounds normal; abdomen soft and nontender. No masses, organomegaly or hernias noted. Genitalia: S/P TAH & BSO                                                                   Musculoskeletal/extremities: No deformity or scoliosis noted of  the thoracic or lumbar spine;but there is some asymmetry of the  posterior thoracic musculature suggesting occult scoliosis. There is minor subluxation of the lower thoracic spine . Straight leg raising is negative bilaterally. No clubbing, cyanosis, edema noted. Range of motion  normal .Tone & strength  normal.Joints :minor OA DIP changes. Nail health  good.She is able to lie flat and sit up without help  Vascular: Carotid, radial artery, dorsalis pedis and  posterior tibial pulses are full and equal. No bruits present. Neurologic: Alert and oriented x3. Deep tendon reflexes symmetrical and normal.          Skin: Intact without suspicious lesions or rashes. Lymph: No cervical, axillary lymphadenopathy present. Psych: Mood and affect are normal.  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 Clinically there is no suggestion this is related to her previous bladder cancer.  #4 EKG suggests increased voltage which is related to her body habitus. She does have a Q wave in V1 and V2; this is stable. Artifact is present with a dropped QRS on 2 occasions. The computer interpreted this and correctly as representing a 2 :1 AV block  Plan: see Orders

## 2011-12-16 LAB — CBC WITH DIFFERENTIAL/PLATELET
Basophils Absolute: 0 10*3/uL (ref 0.0–0.1)
HCT: 40.6 % (ref 36.0–46.0)
Hemoglobin: 13.5 g/dL (ref 12.0–15.0)
Lymphs Abs: 1.8 10*3/uL (ref 0.7–4.0)
Monocytes Relative: 9.7 % (ref 3.0–12.0)
Neutro Abs: 3.7 10*3/uL (ref 1.4–7.7)
RDW: 13 % (ref 11.5–14.6)

## 2011-12-16 LAB — VITAMIN D 25 HYDROXY (VIT D DEFICIENCY, FRACTURES): Vit D, 25-Hydroxy: 59 ng/mL (ref 30–89)

## 2011-12-16 LAB — BASIC METABOLIC PANEL
CO2: 27 mEq/L (ref 19–32)
Chloride: 96 mEq/L (ref 96–112)
GFR: 76.68 mL/min (ref >60.00)
Glucose, Bld: 102 mg/dL — ABNORMAL HIGH (ref 70–99)
Potassium: 5 mEq/L (ref 3.5–5.1)
Sodium: 132 mEq/L — ABNORMAL LOW (ref 135–145)

## 2011-12-16 LAB — TSH: TSH: 1.86 u[IU]/mL (ref 0.35–5.50)

## 2011-12-16 LAB — LIPID PANEL: HDL: 75.4 mg/dL (ref >39.00)

## 2011-12-16 LAB — HEPATIC FUNCTION PANEL
AST: 23 U/L (ref 0–37)
Albumin: 4.2 g/dL (ref 3.5–5.2)
Alkaline Phosphatase: 61 U/L (ref 39–117)
Total Protein: 7 g/dL (ref 6.0–8.3)

## 2012-01-20 ENCOUNTER — Telehealth: Payer: Self-pay | Admitting: Internal Medicine

## 2012-01-20 DIAGNOSIS — R7989 Other specified abnormal findings of blood chemistry: Secondary | ICD-10-CM

## 2012-01-20 DIAGNOSIS — IMO0001 Reserved for inherently not codable concepts without codable children: Secondary | ICD-10-CM

## 2012-01-20 NOTE — Telephone Encounter (Signed)
need lab order for BMET-also need to know if fasting or not, please advise and I can call patient back to set up. Per last labs 5.30.13 see below copied from chart  TO FOLLOW UP LAB APPOINTMENT in 4- 6 weeks for repeat BMET.This will guarantee correct labs are drawn, eliminating need for repeat blood sampling ( needle sticks ! ). Diagnoses /Codes: hyponatremia, azotemia

## 2012-01-20 NOTE — Telephone Encounter (Signed)
Done appt 7.11.13 @ 8am

## 2012-01-20 NOTE — Telephone Encounter (Signed)
Future order placed, patient to fast (Only water or black coffee)

## 2012-01-26 ENCOUNTER — Other Ambulatory Visit (INDEPENDENT_AMBULATORY_CARE_PROVIDER_SITE_OTHER): Payer: Medicare Other

## 2012-01-26 ENCOUNTER — Encounter: Payer: Self-pay | Admitting: Internal Medicine

## 2012-01-26 DIAGNOSIS — IMO0001 Reserved for inherently not codable concepts without codable children: Secondary | ICD-10-CM

## 2012-01-26 DIAGNOSIS — E871 Hypo-osmolality and hyponatremia: Secondary | ICD-10-CM

## 2012-01-26 DIAGNOSIS — Z789 Other specified health status: Secondary | ICD-10-CM

## 2012-01-26 DIAGNOSIS — R7989 Other specified abnormal findings of blood chemistry: Secondary | ICD-10-CM

## 2012-01-26 HISTORY — DX: Hypo-osmolality and hyponatremia: E87.1

## 2012-01-26 LAB — BASIC METABOLIC PANEL
Chloride: 96 mEq/L (ref 96–112)
Potassium: 4.4 mEq/L (ref 3.5–5.1)
Sodium: 131 mEq/L — ABNORMAL LOW (ref 135–145)

## 2012-01-26 NOTE — Progress Notes (Signed)
Labs only

## 2012-02-13 ENCOUNTER — Ambulatory Visit (INDEPENDENT_AMBULATORY_CARE_PROVIDER_SITE_OTHER): Payer: Medicare Other | Admitting: Internal Medicine

## 2012-02-13 ENCOUNTER — Encounter: Payer: Self-pay | Admitting: Internal Medicine

## 2012-02-13 VITALS — BP 120/78 | HR 70 | Wt 104.2 lb

## 2012-02-13 DIAGNOSIS — I472 Ventricular tachycardia: Secondary | ICD-10-CM

## 2012-02-13 DIAGNOSIS — E871 Hypo-osmolality and hyponatremia: Secondary | ICD-10-CM | POA: Insufficient documentation

## 2012-02-13 DIAGNOSIS — I10 Essential (primary) hypertension: Secondary | ICD-10-CM

## 2012-02-13 DIAGNOSIS — I4729 Other ventricular tachycardia: Secondary | ICD-10-CM

## 2012-02-13 NOTE — Progress Notes (Signed)
  Subjective:    Patient ID: [name], female    DOB: Oct 31, 1929, 78 y.o.   MRN: [id number]  HPI  Because of the sensation of weakness she decreased her benazepril by one half pill daily approximately 2 weeks ago. The symptoms persisted and she discontinued the ACE inhibitor altogether. Her blood pressure diary reveals blood pressures ranging from 92/ [phone] with these changes in medication. Her sodium has ranged from a high of 143 on 06/14/10 to the present value of 131. She is on no diuretics such as HCTZ. Her BUN is mildly elevated at 25 creatinine 0.7. Potassium is normal    Review of Systems She denies chest pain, palpitations, claudications, dyspnea, or edema. Although she feels weak she is able to increase her exercise without symptoms. She's had no associated abdominal pain, melena, rectal bleeding. On 12/15/11 CBC and differential were normal. TSH was therapeutic at 1.86. Fasting glucoses have ranged from a value of 102 on 12/15/11 to a high of 128 in November 2011. Her A1c was 5.8% 12/15/11. She denies polydipsia or polyphagia. 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     Objective:   Physical Exam Gen.: Thin but healthy and well-nourished in appearance. Alert, appropriate and cooperative throughout exam.   Eyes: No corneal or conjunctival inflammation noted.  Neck: No deformities, masses, or tenderness noted.  Thyroid small. Lungs: Normal respiratory effort; chest expands symmetrically. Lungs are clear to auscultation without rales, wheezes, or increased work of breathing. Heart: Normal rate and rhythm. Normal S1 and S2. No gallop, click, or rub. S4 with slurring at  LSB . Abdomen: Bowel sounds normal; abdomen soft and nontender. No masses, organomegaly or hernias noted.                                                                                  Musculoskeletal/extremities: asymmetry noted of  the thoracic musculature. No clubbing, cyanosis,  or deformity noted. Minor DIP OA joint changes. Nail health  Good.Trace pedal edema Vascular: Carotid, radial artery, dorsalis pedis and  posterior tibial pulses are full and equal. No bruits present. Neurologic: Alert and oriented x3. Deep tendon reflexes symmetrical and normal.          Skin: Intact without suspicious lesions or rashes. Lymph: No cervical, axillary lymphadenopathy present. Psych: Mood and affect are normal. Normally interactive                                                                                         Assessment & Plan:

## 2012-02-13 NOTE — Assessment & Plan Note (Signed)
Blood pressure is well controlled off ACE inhibitor. Blood pressure monitor will continue with periodic BMET

## 2012-02-13 NOTE — Assessment & Plan Note (Signed)
Dr Vernie Ammons is now performing cystoscopy at 9 month intervals. Followup appointment November/13

## 2012-02-13 NOTE — Patient Instructions (Addendum)
Blood Pressure Goal  Ideally is an AVERAGE < 135/85. This AVERAGE should be calculated from @ least 5-7 BP readings taken @ different times of day on different days of week. You should not respond to isolated BP readings , but rather the AVERAGE for that week.  Please  schedule fasting Labs in 8 weeks : BMET.

## 2012-02-13 NOTE — Assessment & Plan Note (Signed)
She is asymptomatic with no palpitations or tachycardia. Metoprolol will not be changed because of  history of paroxysmal ventricular tachycardia

## 2012-04-09 ENCOUNTER — Other Ambulatory Visit (INDEPENDENT_AMBULATORY_CARE_PROVIDER_SITE_OTHER): Payer: 59

## 2012-04-09 DIAGNOSIS — IMO0001 Reserved for inherently not codable concepts without codable children: Secondary | ICD-10-CM

## 2012-04-09 DIAGNOSIS — R7989 Other specified abnormal findings of blood chemistry: Secondary | ICD-10-CM

## 2012-04-09 DIAGNOSIS — Z789 Other specified health status: Secondary | ICD-10-CM

## 2012-04-09 NOTE — Progress Notes (Signed)
Labs only

## 2012-04-10 LAB — BASIC METABOLIC PANEL
CO2: 30 mEq/L (ref 19–32)
Chloride: 100 mEq/L (ref 96–112)
Glucose, Bld: 97 mg/dL (ref 70–99)
Potassium: 4.3 mEq/L (ref 3.5–5.1)
Sodium: 136 mEq/L (ref 135–145)

## 2012-05-17 ENCOUNTER — Other Ambulatory Visit: Payer: Self-pay

## 2012-05-17 NOTE — Telephone Encounter (Signed)
This medication needs to be taken twice a day because of its half-life or duration of activity. She should take 50 mg one half twice a day dispense 90 refill x1

## 2012-05-17 NOTE — Telephone Encounter (Signed)
Called pt no answer.   MW

## 2012-05-17 NOTE — Telephone Encounter (Signed)
Pt requesting refill, Pt states not taking 2 per day she has decreased med only taking 1 metoprolol per day. Pt verified pharmacy. Plz advise   MW

## 2012-05-18 ENCOUNTER — Telehealth: Payer: Self-pay | Admitting: Internal Medicine

## 2012-05-18 MED ORDER — METOPROLOL TARTRATE 50 MG PO TABS: 50.0000 mg | ORAL_TABLET | Freq: Two times a day (BID) | ORAL | Status: DC

## 2012-05-18 NOTE — Telephone Encounter (Signed)
Caller: Micha/Patient; Patient Name: [name]; PCP: Marga Melnick; Best Callback Phone Number: [phone] Pt states she received a message from Hilda Lias concerning her medication Metoprolol. She is not sure what the question is about. No notes seen in EPIC. Pt states [name] does have her taking less of the medication since her blood pressure has been good. She takes 1/2 a pill in the morning and 1/2 pill in the evening  of the 50 mg tablets. Pt will be home for a few hours at [phone], please call. Can leave a detailed message if you don't reach her.

## 2012-05-18 NOTE — Telephone Encounter (Signed)
Called pt back to advise:  This medication needs to be taken twice a day because of its half-life or duration of activity. She should take 50 mg one half twice a day dispense 90 refill x1  Pt verified pharmacy and Rx sent pt aware. Pt advised when taking meds her BP was lower but she will take it as Rx. I advised pt to call us back and advise if any changes in health, such as low BP etc. Pt stated understanding.   MW

## 2012-05-18 NOTE — Telephone Encounter (Signed)
I called patient and left message on voicemail informing her that Hilda Lias was contacting her to clarify med: Metoprolol. I have updated patient's medication list to reflect what she is currently taking 1/2 bid.

## 2012-06-14 DIAGNOSIS — R7309 Other abnormal glucose: Secondary | ICD-10-CM

## 2012-06-14 HISTORY — DX: Other abnormal glucose: R73.09

## 2012-06-27 ENCOUNTER — Other Ambulatory Visit: Payer: Self-pay | Admitting: Internal Medicine

## 2012-06-27 NOTE — Telephone Encounter (Signed)
RX was sent in on 12/15/2011 for a year supply, refills should still be on file. [facility] called [facility] [address], patient still with pending refills, pharmacy will release rx to be filled. I called patient and informed her rx is already on file at pharmacy and they will get rx ready for her, patient verbalized understanding.

## 2012-06-27 NOTE — Telephone Encounter (Signed)
pt called stated she needs refill for Amlodipine, she stated her son is a PA and gave her the last bottle but he can no longer get anymore--[facility] [address]  Cb# [phone]  Last wrt as AmLODIPine Besylate (Tab) 5 MG Take 1 tablet (5 mg total) by mouth daily. Last wrt 5.30.13 #90 wt/ 3-refills  Last ov 7.29.13 f/u Hypotension, last lab 9.23.13

## 2012-07-18 HISTORY — PX: CATARACT EXTRACTION, BILATERAL: SHX1313

## 2012-10-09 ENCOUNTER — Encounter: Payer: Self-pay | Admitting: Internal Medicine

## 2012-10-09 ENCOUNTER — Ambulatory Visit (INDEPENDENT_AMBULATORY_CARE_PROVIDER_SITE_OTHER): Payer: 59 | Admitting: Internal Medicine

## 2012-10-09 VITALS — BP 124/80 | HR 54 | Temp 97.5°F | Resp 12 | Ht 65.0 in | Wt 107.0 lb

## 2012-10-09 DIAGNOSIS — H25012 Cortical age-related cataract, left eye: Secondary | ICD-10-CM

## 2012-10-09 DIAGNOSIS — R7309 Other abnormal glucose: Secondary | ICD-10-CM

## 2012-10-09 DIAGNOSIS — I4891 Unspecified atrial fibrillation: Secondary | ICD-10-CM

## 2012-10-09 DIAGNOSIS — H25019 Cortical age-related cataract, unspecified eye: Secondary | ICD-10-CM

## 2012-10-09 DIAGNOSIS — I10 Essential (primary) hypertension: Secondary | ICD-10-CM

## 2012-10-09 NOTE — Patient Instructions (Addendum)
Please see preop clearance note ; you are medically cleared for surgery.

## 2012-10-09 NOTE — Progress Notes (Signed)
Subjective:    Patient ID: [name], female    DOB: [birth year], 79 y.o.   MRN: [id number]  HPI   She is scheduled for cataract surgery on the left eye 10/16/12 by Dr. Elmer Picker. She also has macular degeneration for which she's followed by Dr. Luciana Axe.    Review of Systems HYPERTENSION: Disease Monitoring: Blood pressure variable , 120/50s -140/70 Compliant with present antihypertensive regimen   PMH of FASTING HYPERGLYCEMIA with normal A1c : Disease Monitoring: Blood Sugar: no monitor  Medication Compliance: no diabetic meds Diet: low salt, low  fat  Exercise :  4 -5 X / week as walking & Y  No FH premature CAD / CVA   Past medical history/family history/social history were all reviewed and updated. PMH of PAF.Low grade bladder lesion monitored by Dr Vernie Ammons.  No significant headaches No chest pain, palpitations , claudications, PNDyspnea    No exertional dyspnea No lightheadedness or syncope   No edema No polyuria/phagia/dipsia. No limb numbness & tingling or weakness poorly healing skin lesion RLE present ?      No blurred , double or loss of vision No abd pain, bowel changes No significant myalgias , but some LBP intermittently post lifting or yardwork                                                                      Objective:   Physical Exam Gen.: Thin but healthy and well-nourished in appearance. Alert, appropriate and cooperative throughout exam. Appears MUCH  younger than stated age  Head: Normocephalic without obvious abnormalities. Eyes: No corneal or conjunctival inflammation noted. Arcus senilis Nose: External nasal exam reveals no deformity or inflammation. Nasal mucosa are pink and moist. No lesions or exudates noted.   Mouth: Oral mucosa and oropharynx reveal no lesions or exudates. Teeth in good repair. Neck: No deformities, masses, or tenderness noted. Range of motion decreased. Thyroid small & slightly irregular w/o nodules. Lungs: Normal  respiratory effort; chest expands symmetrically. Lungs are clear to auscultation without rales, wheezes, or increased work of breathing. Heart: Normal rate and rhythm. Normal S1 ; accentuated  S2. No gallop, click, or rub. No murmur. Abdomen: Bowel sounds normal; abdomen soft and nontender. No masses, organomegaly or hernias noted. Aorta palpable ; no AAA                                 Musculoskeletal/extremities:There is some asymmetry of the posterior thoracic musculature suggesting occult scoliosis.  No clubbing, cyanosis, edema, or significant extremity  deformity noted. Range of motion normal .Tone & strength  normal.Joints  reveal minor  DJD DIP changes. Nail health good. Able to lie down & sit up w/o help. Negative SLR bilaterally Vascular: Carotid, radial artery, dorsalis pedis and  posterior tibial pulses are full and equal. No bruits present. Neurologic: Alert and oriented x3. Deep tendon reflexes symmetrical and normal.feet.   Skin: Intact without suspicious lesions or rashes. Lymph: No cervical, axillary lymphadenopathy present. Psych: Mood and affect are normal. Normally interactive             Assessment & Plan:  #1 cataract; no contraindication to the planned surgery  #2 hypertension well-controlled.  Chemistries were normal September 2013.  #3 elevated glucose with no evidence of diabetes  #4 past history of paroxysmal atrial fibrillation; normal rhythm at this time  Plan:Copy of record for her to share with Dr. Elmer Picker

## 2012-12-21 ENCOUNTER — Other Ambulatory Visit: Payer: Self-pay | Admitting: Internal Medicine

## 2013-01-07 ENCOUNTER — Telehealth: Payer: Self-pay | Admitting: Internal Medicine

## 2013-01-07 DIAGNOSIS — N649 Disorder of breast, unspecified: Secondary | ICD-10-CM

## 2013-01-07 NOTE — Telephone Encounter (Signed)
Patient states she is due for her mammogram in July, but she felt something in her breast and would like to have it done earlier. She has made appt for next Thursday @ [facility] and needs an order sent to them before then.

## 2013-01-07 NOTE — Telephone Encounter (Signed)
OK ; Dx: new breast lesion on self exam

## 2013-01-07 NOTE — Telephone Encounter (Signed)
Pt states that lump is located on Right breast on bottom of breast, marble sized. Discuss with patient Referral placed

## 2013-01-23 ENCOUNTER — Encounter: Payer: Self-pay | Admitting: Internal Medicine

## 2013-02-11 ENCOUNTER — Other Ambulatory Visit: Payer: Self-pay | Admitting: Internal Medicine

## 2013-04-18 ENCOUNTER — Ambulatory Visit (INDEPENDENT_AMBULATORY_CARE_PROVIDER_SITE_OTHER): Payer: Medicare Other

## 2013-04-18 DIAGNOSIS — Z23 Encounter for immunization: Secondary | ICD-10-CM

## 2013-06-16 ENCOUNTER — Other Ambulatory Visit: Payer: Self-pay | Admitting: Internal Medicine

## 2013-06-17 NOTE — Telephone Encounter (Signed)
Amlodipine refilled per protocol. OV due

## 2013-07-12 ENCOUNTER — Telehealth: Payer: Self-pay

## 2013-07-12 NOTE — Telephone Encounter (Signed)
No answer no vm

## 2013-07-15 NOTE — Telephone Encounter (Signed)
Medication and allergies:  Reviewed and updated  90 day supply/mail order: na Local pharmacy: [facility] [address]   Immunizations due: Shingles?   A/P:   No changes to FH or personal hx; bilateral cataract 2014 MMG--12/2012--benign findings--next due 2015 CCS--aged out Dexa--07/2010 Flu vaccine--04/2013 Tdap--07/2003 PNA--02/2005  To Discuss with Provider: Not at this time

## 2013-07-16 ENCOUNTER — Ambulatory Visit (INDEPENDENT_AMBULATORY_CARE_PROVIDER_SITE_OTHER): Payer: Medicare Other | Admitting: Internal Medicine

## 2013-07-16 ENCOUNTER — Encounter: Payer: Self-pay | Admitting: Internal Medicine

## 2013-07-16 VITALS — BP 170/70 | HR 63 | Temp 97.8°F | Ht 64.0 in | Wt 106.4 lb

## 2013-07-16 DIAGNOSIS — I4949 Other premature depolarization: Secondary | ICD-10-CM

## 2013-07-16 DIAGNOSIS — Z0000 Encounter for general adult medical examination without abnormal findings: Secondary | ICD-10-CM

## 2013-07-16 DIAGNOSIS — M899 Disorder of bone, unspecified: Secondary | ICD-10-CM

## 2013-07-16 DIAGNOSIS — Z8551 Personal history of malignant neoplasm of bladder: Secondary | ICD-10-CM

## 2013-07-16 DIAGNOSIS — I10 Essential (primary) hypertension: Secondary | ICD-10-CM

## 2013-07-16 DIAGNOSIS — E559 Vitamin D deficiency, unspecified: Secondary | ICD-10-CM

## 2013-07-16 LAB — CBC WITH DIFFERENTIAL/PLATELET
Basophils Relative: 0.5 % (ref 0.0–3.0)
Eosinophils Absolute: 0.1 10*3/uL (ref 0.0–0.7)
HCT: 38.4 % (ref 36.0–46.0)
Lymphs Abs: 1.3 10*3/uL (ref 0.7–4.0)
MCHC: 33.1 g/dL (ref 30.0–36.0)
MCV: 94.6 fl (ref 78.0–100.0)
Monocytes Absolute: 0.7 10*3/uL (ref 0.1–1.0)
Neutrophils Relative %: 74.7 % (ref 43.0–77.0)
Platelets: 385 10*3/uL (ref 150.0–400.0)
RBC: 4.06 Mil/uL (ref 3.87–5.11)

## 2013-07-16 LAB — BASIC METABOLIC PANEL
BUN: 13 mg/dL (ref 6–23)
CO2: 28 mEq/L (ref 19–32)
Chloride: 99 mEq/L (ref 96–112)
Creatinine, Ser: 0.7 mg/dL (ref 0.4–1.2)

## 2013-07-16 MED ORDER — AMLODIPINE BESYLATE 5 MG PO TABS: ORAL_TABLET | ORAL | Status: DC

## 2013-07-16 NOTE — Patient Instructions (Signed)
Your next office appointment will be determined based upon review of your pending labs & BMD. Those instructions will be transmitted to you  by mail . To prevent palpitations or premature beats, avoid stimulants such as decongestants, diet pills, nicotine, or caffeine (coffee, tea, cola, or chocolate) to excess. Minimal Blood Pressure Goal= AVERAGE < 140/90;  Ideal is an AVERAGE < 135/85. This AVERAGE should be calculated from @ least 5-7 BP readings taken @ different times of day on different days of week. You should not respond to isolated BP readings , but rather the AVERAGE for that week .Please bring your  blood pressure cuff to office visits to verify that it is reliable.It  can also be checked against the blood pressure device at the pharmacy. Finger or wrist cuffs are not dependable; an arm cuff is.

## 2013-07-16 NOTE — Progress Notes (Signed)
Pre visit review using our clinic review tool, if applicable. No additional management support is needed unless otherwise documented below in the visit note. 

## 2013-07-16 NOTE — Progress Notes (Signed)
Subjective:    Patient ID: [name], female    DOB: 1930-07-08, 79 y.o.   MRN: [id number]  HPI Medicare Wellness Visit: Psychosocial and medical history were reviewed as required by Medicare (history related to abuse, antisocial behavior , firearm risk). Social history: Caffeine: 1.5 cups caffeine daily , Alcohol:no  , Tobacco OVF:IEPPI Exercise:4-5 X/ week 60 minutes @[employer]  Personal safety/fall risk:no Limitations of activities of daily living:no Seatbelt/ smoke alarm use:yes Healthcare Power of Attorney/Living Will status: in place Ophthalmologic exam status:current Hearing evaluation status:not current Orientation: Oriented X3 Memory and recall: good Math testing: good Depression/anxiety assessment: no Foreign travel history:never Immunization status for influenza/pneumonia/ shingles /tetanus:UTD Transfusion history:no Preventive health care maintenance status: Colonoscopy/BMD/mammogram/Pap as per protocol/standard care: BMD needed Dental care:every 6 mos Chart reviewed and updated. Active issues reviewed and addressed as documented below.    Review of Systems Blood pressure range 125-131/60s  Compliant with anti hypertemsive medication. No lightheadedness or other adverse medication effect described.  Significant headaches, epistaxis, chest pain, palpitations, exertional dyspnea, claudication, paroxysmal nocturnal dyspnea, or edema absent.     Objective:   Physical Exam Gen.: Thin but healthy and well-nourished in appearance. Alert, appropriate and cooperative throughout exam.Appears much younger than stated age  Head: Normocephalic without obvious abnormalities  Eyes: No corneal or conjunctival inflammation noted. Pupils equal round reactive to light and accommodation. Extraocular motion intact.  Ears: External  ear exam reveals no significant lesions or deformities. Canals clear .TMs normal. Hearing is slightly decreased on R. Nose: External nasal exam reveals no  deformity or inflammation. Nasal mucosa are pink and moist. No lesions or exudates noted.   Mouth: Oral mucosa and oropharynx reveal no lesions or exudates. Teeth in good repair. Neck: No deformities, masses, or tenderness noted. Range of motion slightly decreased. Thyroid small. Lungs: Normal respiratory effort; chest expands symmetrically. Lungs are clear to auscultation without rales, wheezes, or increased work of breathing. Heart: Normal rate and rhythm. Split S1 accentuated S2. No gallop, click, or rub. Occasional premature beat.No murmur. Faint pulse @ 165 but loud @120 / 70 Abdomen: Bowel sounds normal; abdomen soft and nontender. No masses, organomegaly or hernias noted. Aorta palpable ; bruit present.No AAA Genitalia: S/P TAH & BSO                                  Musculoskeletal/extremities:   Accentuated curvature of lumbar spine. There is some asymmetry of the posterior thoracic musculature suggesting occult scoliosis. No clubbing, cyanosis, edema, or significant extremity  deformity noted. Range of motion normal .Tone & strength normal.Exotosis dorsum R foot. Hand joints  reveal minor flexion changes. Fingernail health good. Able to lie down & sit up w/o help. Negative SLR bilaterally Vascular: Carotid, radial artery, dorsalis pedis and  posterior tibial pulses are full and equal. Aortic bruit present. Neurologic: Alert and oriented x3. Deep tendon reflexes symmetrical and normal.        Skin: Intact without suspicious lesions or rashes. Lymph: No cervical, axillary lymphadenopathy present. Psych: Mood and affect are normal. Normally interactive  Assessment & Plan:  #1 Medicare Wellness Exam; criteria met ; data entered #2 Problem List/Diagnoses reviewed Plan:  Assessments made/ Orders entered  

## 2013-07-19 ENCOUNTER — Ambulatory Visit: Payer: Medicare Other

## 2013-07-19 ENCOUNTER — Encounter: Payer: Self-pay | Admitting: *Deleted

## 2013-07-19 DIAGNOSIS — R7309 Other abnormal glucose: Secondary | ICD-10-CM

## 2013-07-19 LAB — HEMOGLOBIN A1C: HEMOGLOBIN A1C: 6 % (ref 4.6–6.5)

## 2013-07-21 LAB — VITAMIN D 1,25 DIHYDROXY
Vitamin D 1, 25 (OH)2 Total: 69 pg/mL (ref 18–72)
Vitamin D2 1, 25 (OH)2: <8 pg/mL
Vitamin D3 1, 25 (OH)2: 69 pg/mL

## 2013-07-22 ENCOUNTER — Encounter: Payer: Self-pay | Admitting: *Deleted

## 2013-08-14 ENCOUNTER — Other Ambulatory Visit: Payer: Self-pay | Admitting: Internal Medicine

## 2013-08-15 ENCOUNTER — Encounter: Payer: Self-pay | Admitting: Internal Medicine

## 2013-08-15 ENCOUNTER — Ambulatory Visit
Admission: RE | Admit: 2013-08-15 | Discharge: 2013-08-15 | Disposition: A | Discharge status: Home / Self Care | Payer: Medicare Other | Source: Ambulatory Visit | Attending: Internal Medicine | Admitting: Internal Medicine

## 2013-08-15 DIAGNOSIS — M899 Disorder of bone, unspecified: Secondary | ICD-10-CM

## 2013-08-15 DIAGNOSIS — M949 Disorder of cartilage, unspecified: Principal | ICD-10-CM

## 2013-08-16 NOTE — Telephone Encounter (Signed)
Metoprolol refilled per protocol. JG//CMA 

## 2013-08-20 ENCOUNTER — Encounter: Payer: Self-pay | Admitting: *Deleted

## 2014-01-28 ENCOUNTER — Encounter: Payer: Self-pay | Admitting: Internal Medicine

## 2014-02-13 ENCOUNTER — Other Ambulatory Visit: Payer: Self-pay | Admitting: Internal Medicine

## 2014-02-14 ENCOUNTER — Encounter: Payer: Self-pay | Admitting: Internal Medicine

## 2014-03-27 ENCOUNTER — Encounter: Payer: Self-pay | Admitting: Internal Medicine

## 2014-03-27 ENCOUNTER — Ambulatory Visit (INDEPENDENT_AMBULATORY_CARE_PROVIDER_SITE_OTHER): Payer: Medicare Other | Admitting: Internal Medicine

## 2014-03-27 ENCOUNTER — Other Ambulatory Visit (INDEPENDENT_AMBULATORY_CARE_PROVIDER_SITE_OTHER): Payer: Medicare Other

## 2014-03-27 VITALS — BP 144/60 | HR 62 | Temp 97.8°F | Resp 14 | Ht 64.5 in | Wt 106.0 lb

## 2014-03-27 DIAGNOSIS — I10 Essential (primary) hypertension: Secondary | ICD-10-CM

## 2014-03-27 DIAGNOSIS — R7309 Other abnormal glucose: Secondary | ICD-10-CM

## 2014-03-27 DIAGNOSIS — I4891 Unspecified atrial fibrillation: Secondary | ICD-10-CM

## 2014-03-27 DIAGNOSIS — M810 Age-related osteoporosis without current pathological fracture: Secondary | ICD-10-CM

## 2014-03-27 DIAGNOSIS — I480 Paroxysmal atrial fibrillation: Secondary | ICD-10-CM

## 2014-03-27 DIAGNOSIS — Z8551 Personal history of malignant neoplasm of bladder: Secondary | ICD-10-CM

## 2014-03-27 DIAGNOSIS — Z23 Encounter for immunization: Secondary | ICD-10-CM

## 2014-03-27 DIAGNOSIS — Z0000 Encounter for general adult medical examination without abnormal findings: Secondary | ICD-10-CM

## 2014-03-27 DIAGNOSIS — E559 Vitamin D deficiency, unspecified: Secondary | ICD-10-CM

## 2014-03-27 LAB — CBC WITH DIFFERENTIAL/PLATELET
BASOS ABS: 0 10*3/uL (ref 0.0–0.1)
Basophils Relative: 0.5 % (ref 0.0–3.0)
EOS ABS: 0.1 10*3/uL (ref 0.0–0.7)
Eosinophils Relative: 2.1 % (ref 0.0–5.0)
HCT: 40.4 % (ref 36.0–46.0)
Hemoglobin: 13.4 g/dL (ref 12.0–15.0)
LYMPHS ABS: 1.3 10*3/uL (ref 0.7–4.0)
LYMPHS PCT: 24.5 % (ref 12.0–46.0)
MCHC: 33.3 g/dL (ref 30.0–36.0)
MCV: 95 fl (ref 78.0–100.0)
MONOS PCT: 8.4 % (ref 3.0–12.0)
Monocytes Absolute: 0.4 10*3/uL (ref 0.1–1.0)
NEUTROS ABS: 3.4 10*3/uL (ref 1.4–7.7)
NEUTROS PCT: 64.5 % (ref 43.0–77.0)
Platelets: 347 10*3/uL (ref 150.0–400.0)
RBC: 4.26 Mil/uL (ref 3.87–5.11)
RDW: 12.8 % (ref 11.5–15.5)
WBC: 5.2 10*3/uL (ref 4.0–10.5)

## 2014-03-27 LAB — BASIC METABOLIC PANEL
BUN: 18 mg/dL (ref 6–23)
CHLORIDE: 100 meq/L (ref 96–112)
CO2: 29 mEq/L (ref 19–32)
CREATININE: 0.8 mg/dL (ref 0.4–1.2)
Calcium: 9.2 mg/dL (ref 8.4–10.5)
GFR: 75.11 mL/min (ref >60.00)
Glucose, Bld: 116 mg/dL — ABNORMAL HIGH (ref 70–99)
Potassium: 4.9 mEq/L (ref 3.5–5.1)
Sodium: 135 mEq/L (ref 135–145)

## 2014-03-27 LAB — TSH: TSH: 2.15 u[IU]/mL (ref 0.35–4.50)

## 2014-03-27 LAB — HEMOGLOBIN A1C: HEMOGLOBIN A1C: 5.8 % (ref 4.6–6.5)

## 2014-03-27 NOTE — Progress Notes (Signed)
Subjective:    Patient ID: [name], female    DOB: 03-13-1930, 80 y.o.   MRN: [id number]  HPI  [employer]/Medicare Wellness Visit: Psychosocial and medical history were reviewed as required by Medicare (history related to abuse, antisocial behavior , firearm risk). Social history: Caffeine: 1-1.5 cups/ day & 1 glass tea , Alcohol: no , Tobacco XKG:YJEHU Exercise:4X/week as biking, weights, treadmill forup to 90 min Personal safety/fall risk:no Limitations of activities of daily living:no Seatbelt/ smoke alarm use:yes Healthcare Power of Attorney/Living Will status: UTD Ophthalmologic exam status:UTD Hearing evaluation status:not UTD Orientation: Oriented X3 Memory and recall: excellent Spelling  testing: good Depression/anxiety assessment: no Foreign travel history:never Immunization status for influenza/pneumonia/ shingles /tetanus: Flu today Transfusion history:no Preventive health care maintenance status: Colonoscopy/BMD/mammogram/Pap as per protocol/standard care:? Colonoscopy due (TBD) Dental care:every 6 mos Chart reviewed and updated. Active issues reviewed and addressed as documented below.  Blood pressure range / average : 99/47-133/65 Compliant with anti hypertemsive medication. No lightheadedness or other adverse medication effect described.  A heart healthy /low salt diet is followed.   Review of Systems   Significant headaches, epistaxis, chest pain, palpitations, exertional dyspnea, claudication, paroxysmal nocturnal dyspnea, or edema absent.       Objective:   Physical Exam  Gen.: Thin but healthy and well-nourished in appearance. Alert, appropriate and cooperative throughout exam. Appears younger than stated age  Head: Normocephalic without obvious abnormalities  Eyes: Arcus senilis.No corneal or conjunctival inflammation noted. Pupils slightly eccentrically located but equally round reactive to light . Extraocular motion intact.  Ears: External  ear exam  reveals no significant lesions or deformities. Wax bilaterally. Hearing is grossly decreased bilaterally. Nose: External nasal exam reveals no deformity or inflammation. Nasal mucosa are pink and moist. No lesions or exudates noted.   Mouth: Oral mucosa and oropharynx reveal no lesions or exudates. Teeth in good repair. Neck: No deformities, masses, or tenderness noted. Range of motion & Thyroid normal Lungs: Breath sounds slightly decreased.Normal respiratory effort; chest expands symmetrically. Lungs are clear to auscultation without rales, wheezes, or increased work of breathing. Heart: Slow rate and regular rhythm. Normal S1 and S2. No gallop, click, or rub. No murmur. Abdomen: Bowel sounds normal; abdomen soft and nontender. No masses, organomegaly or hernias noted.Aorta palpable ; no AAA Genitalia: as per Gyn                                  Musculoskeletal/extremities: There is some asymmetry of the posterior thoracic musculature suggesting occult scoliosis. No clubbing, cyanosis, edema, or significant extremity  deformity noted. Range of motion normal .Tone & strength normal. Hand joints normal except slight flexion 5th L finger..  Fingernail  health good. Able to lie down & sit up w/o help. Negative SLR bilaterally Vascular: Carotid, radial artery, dorsalis pedis and  posterior tibial pulses are equal. Slightly decreased PTP.No bruits present. Neurologic: Alert and oriented x3. Deep tendon reflexes symmetrical and normal.  Gait normal  including heel & toe walking . Rhomberg & finger to nose       Skin: Intact without suspicious lesions or rashes.Healing elliptical lacerations of shins. Lymph: No cervical, axillary lymphadenopathy present. Psych: Mood and affect are normal. Normally interactive  Assessment & Plan:  See Current Assessment & Plan in Problem List under specific DiagnosisThe labs will  be reviewed and risks and options assessed. Written recommendations will be provided by mail or directly through My Chart.Further evaluation or change in medical therapy will be directed by those results.

## 2014-03-27 NOTE — Assessment & Plan Note (Signed)
A1c

## 2014-03-27 NOTE — Assessment & Plan Note (Signed)
CBC

## 2014-03-27 NOTE — Patient Instructions (Signed)
Decrease Metoprolol to 50 mg 1/2 twice a day. Minimal Blood Pressure Goal= AVERAGE < 150/90;  Ideal is an AVERAGE < 135/85. This AVERAGE should be calculated from @ least 5-7 BP readings taken @ different times of day on different days of week. You should not respond to isolated BP readings , but rather the AVERAGE for that week .Please bring your  blood pressure cuff to office visits to verify that it is reliable.It  can also be checked against the blood pressure device at the pharmacy. Finger or wrist cuffs are not dependable; an arm cuff is.  Your next office appointment will be determined based upon review of your pending labs . Those instructions will be transmitted to you  by mail.

## 2014-03-27 NOTE — Assessment & Plan Note (Signed)
Blood pressure goals reviewed. BMET Decrease Beta blocker to 1/2 bid

## 2014-03-27 NOTE — Progress Notes (Signed)
Pre visit review using our clinic review tool, if applicable. No additional management support is needed unless otherwise documented below in the visit note. 

## 2014-05-02 ENCOUNTER — Encounter: Payer: Self-pay | Admitting: Internal Medicine

## 2014-07-16 ENCOUNTER — Encounter: Payer: Self-pay | Admitting: Internal Medicine

## 2014-07-16 ENCOUNTER — Other Ambulatory Visit: Payer: Self-pay

## 2014-07-16 ENCOUNTER — Ambulatory Visit (INDEPENDENT_AMBULATORY_CARE_PROVIDER_SITE_OTHER): Payer: Medicare Other | Admitting: Internal Medicine

## 2014-07-16 VITALS — BP 158/68 | HR 68 | Ht 64.0 in | Wt 108.1 lb

## 2014-07-16 DIAGNOSIS — Z1211 Encounter for screening for malignant neoplasm of colon: Secondary | ICD-10-CM

## 2014-07-16 MED ORDER — AMLODIPINE BESYLATE 5 MG PO TABS: ORAL_TABLET | ORAL | Status: DC

## 2014-07-16 NOTE — Patient Instructions (Signed)
Follow up with Dr Olevia Perches as needed.  CC: Dr Unice Cobble

## 2014-07-16 NOTE — Progress Notes (Signed)
[name] [birth year] [id number]  Note: This dictation was prepared with Dragon digital system. Any transcriptional errors that result from this procedure are unintentional.   History of Present Illness: This is an 80 year old white female here to discuss screening colonoscopy. She had a flexible sigmoidoscopy in 1998 and  colonoscopy in October 2005 which was a normal exam. She denies any abdominal pain, diarrhea or change in bowel habits. She works out 4 times a week and has a healthy diet. She is not interested in a recall colonoscopy. There is no family history of colon cancer.    Past Medical History  Diagnosis Date  . Hypertension   . Cancer     low grade papillary, most recent recurrence 2011,  Dr Karsten Ro  . Hyponatremia 01/26/12    Na+ 131  . Other abnormal glucose 06/14/12    Fasting blood glucose 128; A1c 5.8% on 12/15/11  . 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 Arthritis   . Atrial fibrillation     Past Surgical History  Procedure Laterality Date  . Appendectomy      1970s  . Abdominal hysterectomy      with oopherectomy for incidenctal cyst 1970s (no PMH of abnormal PAP)  . Cystoscopy       multiple since tumor excision;Dr Ottelin  . Bladder tumor excision  2011 & 2013    Dr Karsten Ro  . Cataract extraction, bilateral  2014    Dr Herbert Deaner  . Colonoscopy      negative; Dr Olevia Perches    No Known Allergies  Family history and social history have been reviewed.  Review of Systems: Negative for abdominal pain, weight loss or rectal bleeding  The remainder of the 10 point ROS is negative except as outlined in the H&P  Physical Exam: General Appearance thin, in no distress Eyes  Non icteric  HEENT  Non traumatic, normocephalic  Mouth No lesion, tongue papillated, no cheilosis Neck Supple without adenopathy, thyroid not enlarged, no carotid bruits, no JVD Lungs Clear to auscultation bilaterally COR Normal S1, normal S2, regular rhythm, no murmur,  quiet precordium Abdomen  Soft, scaphoid, nontender. Normoactive bowel sounds. No fullness. Liver edge at costal margin Rectal Brown Hemoccult negative stool Extremities  No pedal edema Skin No lesions Neurological Alert and oriented x 3 Psychological Normal mood and affect  Assessment and Plan:   Problem #41 An 80 year old white female is here today to discuss a recall colonoscopy. Her last exam was 10 years ago. Because of her age, she prefers not to continue colorectal screening. She is Hemoccult negative and has no specific risk factors. We will no longer plan recall colonoscopies. She will call us if there are any acute issues.    Delfin Edis 07/16/2014

## 2014-08-07 DIAGNOSIS — H3532 Exudative age-related macular degeneration: Secondary | ICD-10-CM | POA: Diagnosis not present

## 2014-08-07 DIAGNOSIS — H35051 Retinal neovascularization, unspecified, right eye: Secondary | ICD-10-CM | POA: Diagnosis not present

## 2014-09-17 ENCOUNTER — Other Ambulatory Visit: Payer: Self-pay | Admitting: Internal Medicine

## 2014-09-18 DIAGNOSIS — H3532 Exudative age-related macular degeneration: Secondary | ICD-10-CM | POA: Diagnosis not present

## 2014-09-18 DIAGNOSIS — H35052 Retinal neovascularization, unspecified, left eye: Secondary | ICD-10-CM | POA: Diagnosis not present

## 2014-10-02 DIAGNOSIS — H3532 Exudative age-related macular degeneration: Secondary | ICD-10-CM | POA: Diagnosis not present

## 2014-11-26 DIAGNOSIS — H3532 Exudative age-related macular degeneration: Secondary | ICD-10-CM | POA: Diagnosis not present

## 2014-11-26 DIAGNOSIS — H3531 Nonexudative age-related macular degeneration: Secondary | ICD-10-CM | POA: Diagnosis not present

## 2014-12-16 ENCOUNTER — Other Ambulatory Visit: Payer: Self-pay

## 2014-12-16 ENCOUNTER — Telehealth: Payer: Self-pay | Admitting: Internal Medicine

## 2014-12-16 MED ORDER — METOPROLOL TARTRATE 50 MG PO TABS: 50.0000 mg | ORAL_TABLET | Freq: Two times a day (BID) | ORAL | Status: DC

## 2014-12-16 NOTE — Telephone Encounter (Signed)
Metoprolol rx sent in with 30 tablets only---patient needs office visit before further refills

## 2014-12-16 NOTE — Telephone Encounter (Signed)
Patient is requesting a refill for metoprolol (LOPRESSOR) 25 MG tablet [[id number]. On her last appointment she was told to take half of her 50 mg tablets twice a day so she would only take 25mg  twice a day. Pharmacy is [facility] [address].

## 2014-12-29 DIAGNOSIS — Z8551 Personal history of malignant neoplasm of bladder: Secondary | ICD-10-CM | POA: Diagnosis not present

## 2015-01-13 ENCOUNTER — Other Ambulatory Visit (INDEPENDENT_AMBULATORY_CARE_PROVIDER_SITE_OTHER): Payer: Medicare Other

## 2015-01-13 ENCOUNTER — Encounter: Payer: Self-pay | Admitting: Internal Medicine

## 2015-01-13 ENCOUNTER — Ambulatory Visit (INDEPENDENT_AMBULATORY_CARE_PROVIDER_SITE_OTHER): Payer: Medicare Other | Admitting: Internal Medicine

## 2015-01-13 VITALS — BP 120/70 | HR 66 | Temp 98.0°F | Resp 16 | Wt 106.0 lb

## 2015-01-13 DIAGNOSIS — R739 Hyperglycemia, unspecified: Secondary | ICD-10-CM | POA: Diagnosis not present

## 2015-01-13 DIAGNOSIS — I480 Paroxysmal atrial fibrillation: Secondary | ICD-10-CM

## 2015-01-13 DIAGNOSIS — I10 Essential (primary) hypertension: Secondary | ICD-10-CM | POA: Diagnosis not present

## 2015-01-13 DIAGNOSIS — E559 Vitamin D deficiency, unspecified: Secondary | ICD-10-CM | POA: Diagnosis not present

## 2015-01-13 LAB — BASIC METABOLIC PANEL
BUN: 20 mg/dL (ref 6–23)
CALCIUM: 9.6 mg/dL (ref 8.4–10.5)
CO2: 29 mEq/L (ref 19–32)
CREATININE: 0.68 mg/dL (ref 0.40–1.20)
Chloride: 103 mEq/L (ref 96–112)
GFR: 87.83 mL/min (ref >60.00)
GLUCOSE: 120 mg/dL — AB (ref 70–99)
Potassium: 4.2 mEq/L (ref 3.5–5.1)
SODIUM: 138 meq/L (ref 135–145)

## 2015-01-13 LAB — HEMOGLOBIN A1C: Hgb A1c MFr Bld: 5.7 % (ref 4.6–6.5)

## 2015-01-13 LAB — TSH: TSH: 2.45 u[IU]/mL (ref 0.35–4.50)

## 2015-01-13 LAB — VITAMIN D 25 HYDROXY (VIT D DEFICIENCY, FRACTURES): VITD: 33.16 ng/mL (ref 30.00–100.00)

## 2015-01-13 MED ORDER — METOPROLOL TARTRATE 25 MG PO TABS: 25.0000 mg | ORAL_TABLET | Freq: Two times a day (BID) | ORAL | Status: DC

## 2015-01-13 NOTE — Assessment & Plan Note (Signed)
A1c

## 2015-01-13 NOTE — Assessment & Plan Note (Signed)
Vitamin D level 

## 2015-01-13 NOTE — Progress Notes (Signed)
   Subjective:    Patient ID: [name], female    DOB: 1930/04/17, 81 y.o.   MRN: [id number]  HPI The patient is here to assess status of active health conditions.  PMH, FH, & Social History reviewed & updated.   She has been compliant with her medications without adverse effects. She is on a heart healthy no added salt diet. She exercises 4 days a week as walking, biking, or using machines @ the gym. She has no associated cardio pulmonary symptoms.  Her son who is a PA monitors her blood pressure. It averages less than 130/60. She has been on metoprolol 50 mg one half twice a day rather than 50 mg twice a day.  She does have some pruritis over the upper back and was concerned about any  Abnormal "moles".  She has nocturia 1-2 times per night. She is followed annually by her Urologist because of her history of bladder neoplasm.    Review of Systems  Chest pain, palpitations, tachycardia, exertional dyspnea, paroxysmal nocturnal dyspnea, claudication or edema are absent. No unexplained weight loss, abdominal pain, significant dyspepsia, dysphagia, melena, rectal bleeding, or persistently small caliber stools. Dysuria, pyuria, hematuria, frequency, or polyuria are denied. Change in hair or nails denied. No bowel changes of constipation or diarrhea. No intolerance to heat or cold.     Objective:   Physical Exam Pertinent or positive findings include: Second heart sound is increased. She has excoriations over the upper back. There are no suspicious neoplasms. The aorta is palpable; no aneurysm is present. She has trace pedal edema. She has crepitus of the knees. There are minor DIP osteoarthritic changes.  General appearance : Thin but adequately nourished; in no distress.  Eyes: No conjunctival inflammation or scleral icterus is present.  Oral exam:  Lips and gums are healthy appearing.There is no oropharyngeal erythema or exudate noted. Dental hygiene is good.  Heart:  Normal  rate and regular rhythm. S1 is normal without gallop, murmur, click, rub or other extra sounds    Lungs:Chest clear to auscultation; no wheezes, rhonchi,rales ,or rubs present.No increased work of breathing.   Abdomen: bowel sounds normal, soft and non-tender without masses, organomegaly or hernias noted.  No guarding or rebound.   Vascular : all pulses equal ; no bruits present.  Skin:Warm & dry.  Intact without suspicious lesions or rashes ; no tenting or jaundice   Lymphatic: No lymphadenopathy is noted about the head, neck, axilla   Neuro: Strength, tone & DTRs normal.          Assessment & Plan:  See Current Assessment & Plan in Problem List under specific Diagnosis

## 2015-01-13 NOTE — Patient Instructions (Addendum)
Minimal Blood Pressure Goal= AVERAGE < 140/90;  Ideal is an AVERAGE < 135/85. This AVERAGE should be calculated from @ least 5-7 BP readings taken @ different times of day on different days of week. You should not respond to isolated BP readings , but rather the AVERAGE for that week .Please bring your  blood pressure cuff to office visits to verify that it is reliable.It  can also be checked against the blood pressure device at the pharmacy. Finger or wrist cuffs are not dependable; an arm cuff is. Your next office appointment will be determined based upon review of your pending labs.  Those written interpretation of the lab results and instructions will be transmitted to you by mail for your records.  Critical results will be called.   Followup as needed for any active or acute issue. Please report any significant change in your symptoms.  Use Eucerin or Aveeno Daily  Moisturizing Lotion  twice a day  for the dry skin. Bathe with moisturizing liquid soap , not bar soap.

## 2015-01-13 NOTE — Assessment & Plan Note (Signed)
Blood pressure goals reviewed. BMET 

## 2015-01-22 DIAGNOSIS — H3532 Exudative age-related macular degeneration: Secondary | ICD-10-CM | POA: Diagnosis not present

## 2015-01-22 DIAGNOSIS — H3531 Nonexudative age-related macular degeneration: Secondary | ICD-10-CM | POA: Diagnosis not present

## 2015-02-04 ENCOUNTER — Telehealth: Payer: Self-pay | Admitting: Internal Medicine

## 2015-02-04 DIAGNOSIS — N63 Unspecified lump in breast: Secondary | ICD-10-CM | POA: Diagnosis not present

## 2015-02-04 LAB — HM MAMMOGRAPHY

## 2015-02-04 NOTE — Telephone Encounter (Signed)
Tuesday from [facility] mammography stated she sent an order for a mammogram to be signed ,and haven't received it yet, patient has appt at 9:00 today [phone], please advise

## 2015-02-05 NOTE — Telephone Encounter (Signed)
Order was sent to [facility]

## 2015-02-06 ENCOUNTER — Encounter: Payer: Self-pay | Admitting: Internal Medicine

## 2015-02-12 ENCOUNTER — Encounter: Payer: Self-pay | Admitting: Internal Medicine

## 2015-02-20 DIAGNOSIS — L821 Other seborrheic keratosis: Secondary | ICD-10-CM | POA: Diagnosis not present

## 2015-02-20 DIAGNOSIS — S80862S Insect bite (nonvenomous), left lower leg, sequela: Secondary | ICD-10-CM | POA: Diagnosis not present

## 2015-03-05 DIAGNOSIS — H3532 Exudative age-related macular degeneration: Secondary | ICD-10-CM | POA: Diagnosis not present

## 2015-03-12 DIAGNOSIS — H3532 Exudative age-related macular degeneration: Secondary | ICD-10-CM | POA: Diagnosis not present

## 2015-04-03 ENCOUNTER — Telehealth: Payer: Self-pay | Admitting: *Deleted

## 2015-04-03 MED ORDER — AMLODIPINE BESYLATE 5 MG PO TABS: ORAL_TABLET | ORAL | Status: DC

## 2015-04-03 NOTE — Telephone Encounter (Signed)
Left msg on triage stating [facility] on her amlodipine for 3 days still haven't receive response. Called pt inform her per chart md hasn't receive electronic request. Pharmacy must send electronically for md to refill.  Will [facility]ill [facility] [address]...Johny Chess

## 2015-04-23 DIAGNOSIS — H353221 Exudative age-related macular degeneration, left eye, with active choroidal neovascularization: Secondary | ICD-10-CM | POA: Diagnosis not present

## 2015-04-28 ENCOUNTER — Ambulatory Visit (INDEPENDENT_AMBULATORY_CARE_PROVIDER_SITE_OTHER): Payer: Medicare Other

## 2015-04-28 DIAGNOSIS — Z23 Encounter for immunization: Secondary | ICD-10-CM

## 2015-06-16 DIAGNOSIS — H353221 Exudative age-related macular degeneration, left eye, with active choroidal neovascularization: Secondary | ICD-10-CM | POA: Diagnosis not present

## 2015-08-24 ENCOUNTER — Encounter: Payer: Self-pay | Admitting: Internal Medicine

## 2015-10-01 ENCOUNTER — Telehealth: Payer: Self-pay | Admitting: Internal Medicine

## 2015-10-01 MED ORDER — AMLODIPINE BESYLATE 5 MG PO TABS: ORAL_TABLET | ORAL | Status: DC

## 2015-10-01 NOTE — Telephone Encounter (Signed)
rx sent to pharmacy

## 2015-10-01 NOTE — Telephone Encounter (Signed)
Pt called request refill for amLODipine (NORVASC) 5 MG tablet to [facility]. Pt is out of this med and they are trying to contact us couple times. Please help, pt has an appt to see Dr. Quay Burow in May 2017

## 2015-10-02 NOTE — Telephone Encounter (Signed)
Notified pt MD [facility].../l;mb 

## 2015-12-16 ENCOUNTER — Encounter: Payer: Self-pay | Admitting: Internal Medicine

## 2015-12-16 ENCOUNTER — Other Ambulatory Visit (INDEPENDENT_AMBULATORY_CARE_PROVIDER_SITE_OTHER): Payer: Medicare Other

## 2015-12-16 ENCOUNTER — Ambulatory Visit (INDEPENDENT_AMBULATORY_CARE_PROVIDER_SITE_OTHER): Payer: Medicare Other | Admitting: Internal Medicine

## 2015-12-16 VITALS — BP 178/80 | HR 68 | Temp 97.8°F | Resp 16 | Wt 102.0 lb

## 2015-12-16 DIAGNOSIS — I4891 Unspecified atrial fibrillation: Secondary | ICD-10-CM

## 2015-12-16 DIAGNOSIS — I10 Essential (primary) hypertension: Secondary | ICD-10-CM

## 2015-12-16 DIAGNOSIS — Z0000 Encounter for general adult medical examination without abnormal findings: Secondary | ICD-10-CM

## 2015-12-16 DIAGNOSIS — Z8551 Personal history of malignant neoplasm of bladder: Secondary | ICD-10-CM | POA: Diagnosis not present

## 2015-12-16 DIAGNOSIS — M810 Age-related osteoporosis without current pathological fracture: Secondary | ICD-10-CM

## 2015-12-16 DIAGNOSIS — Z23 Encounter for immunization: Secondary | ICD-10-CM

## 2015-12-16 LAB — COMPREHENSIVE METABOLIC PANEL
ALK PHOS: 66 U/L (ref 39–117)
ALT: 15 U/L (ref 0–35)
AST: 18 U/L (ref 0–37)
Albumin: 4.1 g/dL (ref 3.5–5.2)
BILIRUBIN TOTAL: 0.4 mg/dL (ref 0.2–1.2)
BUN: 18 mg/dL (ref 6–23)
CALCIUM: 9.3 mg/dL (ref 8.4–10.5)
CO2: 30 meq/L (ref 19–32)
CREATININE: 0.65 mg/dL (ref 0.40–1.20)
Chloride: 102 mEq/L (ref 96–112)
GFR: 92.31 mL/min (ref >60.00)
Glucose, Bld: 112 mg/dL — ABNORMAL HIGH (ref 70–99)
Potassium: 4.1 mEq/L (ref 3.5–5.1)
Sodium: 136 mEq/L (ref 135–145)
TOTAL PROTEIN: 6.8 g/dL (ref 6.0–8.3)

## 2015-12-16 LAB — CBC WITH DIFFERENTIAL/PLATELET
BASOS ABS: 0 10*3/uL (ref 0.0–0.1)
Basophils Relative: 0.7 % (ref 0.0–3.0)
EOS ABS: 0.1 10*3/uL (ref 0.0–0.7)
Eosinophils Relative: 1.8 % (ref 0.0–5.0)
HEMATOCRIT: 40 % (ref 36.0–46.0)
Hemoglobin: 13.1 g/dL (ref 12.0–15.0)
LYMPHS ABS: 1.4 10*3/uL (ref 0.7–4.0)
LYMPHS PCT: 23.4 % (ref 12.0–46.0)
MCHC: 32.7 g/dL (ref 30.0–36.0)
MCV: 93.5 fl (ref 78.0–100.0)
MONOS PCT: 7.9 % (ref 3.0–12.0)
Monocytes Absolute: 0.5 10*3/uL (ref 0.1–1.0)
NEUTROS ABS: 4 10*3/uL (ref 1.4–7.7)
NEUTROS PCT: 66.2 % (ref 43.0–77.0)
PLATELETS: 330 10*3/uL (ref 150.0–400.0)
RBC: 4.28 Mil/uL (ref 3.87–5.11)
RDW: 13.8 % (ref 11.5–15.5)
WBC: 6 10*3/uL (ref 4.0–10.5)

## 2015-12-16 LAB — TSH: TSH: 2.12 u[IU]/mL (ref 0.35–4.50)

## 2015-12-16 MED ORDER — METOPROLOL TARTRATE 25 MG PO TABS: 25.0000 mg | ORAL_TABLET | Freq: Two times a day (BID) | ORAL | Status: DC

## 2015-12-16 MED ORDER — AMLODIPINE BESYLATE 5 MG PO TABS: ORAL_TABLET | ORAL | Status: DC

## 2015-12-16 NOTE — Patient Instructions (Addendum)
  [name] , Thank you for taking time to come for your Medicare Wellness Visit. I appreciate your ongoing commitment to your health goals. Please review the following plan we discussed and let me know if I can assist you in the future.   These are the goals we discussed: Goals    None      This is a list of the screening recommended for you and due dates:  Health Maintenance  Topic Date Due  . Pneumonia vaccines (2 of 2 - PCV13) today  . Tetanus Vaccine  07/18/2013  . Flu Shot  02/16/2016  . DEXA scan (bone density measurement)  Completed  . Shingles Vaccine  Completed    Test(s) ordered today. Your results will be released to [place] (or called to you) after review, usually within 72hours after test completion. If any changes need to be made, you will be notified at that same time.  All other Health Maintenance issues reviewed.   All recommended immunizations and age-appropriate screenings are up-to-date or discussed.  prevnar vaccine administered today.   Medications reviewed and updated.  No changes recommended at this time.  Your prescription(s) have been submitted to your pharmacy. Please take as directed and contact our office if you believe you are having problem(s) with the medication(s).  Please followup in one year

## 2015-12-16 NOTE — Assessment & Plan Note (Signed)
Follows with urology once a year No evidence of recurrence

## 2015-12-16 NOTE — Progress Notes (Signed)
Pre visit review using our clinic review tool, if applicable. No additional management support is needed unless otherwise documented below in the visit note. 

## 2015-12-16 NOTE — Assessment & Plan Note (Signed)
dexa up to date Calcium and vitamin d daily Continue regular exercise

## 2015-12-16 NOTE — Progress Notes (Signed)
Subjective:    Patient ID: [name], female    DOB: 03-29-1930, 82 y.o.   MRN: [id number]  HPI She is here to establish with a new pcp.  She is here for a physical and wellness exam.     I have personally reviewed and have noted 1.The patient's medical and social history 2.Their use of alcohol, tobacco or illicit drugs 3.Their current medications and supplements 4.The patient's functional ability including ADL's, fall risks, home safety risks and                 hearing or visual impairment. 5.Diet and physical activities 6.Evidence for depression or mood disorders 7.Care team reviewed and updated (available in snapshot)   Are there smokers in your home (other than you)? No  Risk Factors Exercise: [employer] - 4 days a week - treadmill, bike, weights Dietary issues discussed: healthy diet, sometimes cooks  Cardiac risk factors: advanced age, hypertension  Depression Screen  Have you felt down, depressed or hopeless? No  Have you felt little interest or pleasure in doing things?  No  Activities of Daily Living In your present state of health, do you have any difficulty performing the following activities?:  Driving? No Managing money?  No Feeding yourself? No Getting from bed to chair? No Climbing a flight of stairs? No Preparing food and eating?: No Bathing or showering? No Getting dressed: No Getting to/using the toilet? No Moving around from place to place: No In the past year have you fallen or had a near fall?: No   Are you sexually active?  No  Do you have more than one partner?  N/A  Hearing Difficulties: No Do you often ask people to speak up or repeat themselves? No Do you experience ringing or noises in your ears? yes Do you have difficulty understanding soft or whispered voices? yes Vision:              Any change in vision: no             Up to date with eye exam:  Up to date   Memory:  Do you feel that you have a problem with memory? No, recall only  Do you often misplace items? No  Do you feel safe at home?  Yes  Cognitive Testing  Alert, Orientated? Yes  Normal Appearance? Yes  Recall of three objects?  Yes  Can perform simple calculations? Yes  Displays appropriate judgment? Yes  Can read the correct time from a watch face? Yes   Advanced Directives have been discussed with the patient? Yes - in place  Medications and allergies reviewed with patient and updated if appropriate.  Patient Active Problem List   Diagnosis Date Noted  . Hyponatremia 02/13/2012  . MACULAR DEGENERATION, BILATERAL 06/14/2010  . Vitamin D deficiency 03/10/2009  . Osteoporosis, unspecified 03/10/2009  . NEOPLASM, MALIGNANT, BLADDER, HX OF 03/10/2009  . POSTMENOPAUSAL STATUS 01/23/2009  . 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  . Hyperglycemia 06/26/2008  . Essential hypertension 08/09/2007  . ATRIAL FIBRILLATION WITH RAPID VENTRICULAR RESPONSE 08/09/2007    Current Outpatient Prescriptions on File Prior to Visit  Medication Sig Dispense Refill  . amLODipine (NORVASC) 5 MG tablet TAKE 1 TABLET (5 MG TOTAL) BY MOUTH DAILY. 90 tablet 0  . aspirin 81 MG tablet Take 81 mg by mouth daily.    . Bilberry, Vaccinium myrtillus, (BILBERRY PO) Take by mouth daily.    . metoprolol tartrate (LOPRESSOR) 25 MG tablet Take 1  tablet (25 mg total) by mouth 2 (two) times daily. 180 tablet 3  . Multiple Vitamins-Minerals (VISION-VITE PRESERVE PO) Take 1 tablet by mouth daily.    . multivitamin-lutein (OCUVITE-LUTEIN) CAPS capsule Take 1 capsule by mouth daily.    . Omega-3 Fatty Acids (FISH OIL) 1000 MG CAPS Take 1 capsule by mouth daily.    Marland Kitchen CALCIUM CARBONATE PO Take 1,200 mg by mouth daily. Reported on 12/16/2015    . magnesium gluconate (MAGONATE) 500 MG tablet Take 500 mg by mouth daily. Reported on 12/16/2015     No current facility-administered medications on file prior to visit.    Past  Medical History  Diagnosis Date  . Hypertension   . Cancer     low grade papillary, most recent recurrence 2011,  Dr Karsten Ro  . Hyponatremia 01/26/12    Na+ 131  . Other abnormal glucose 06/14/12    Fasting blood glucose 128; A1c 5.8% on 12/15/11  . 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 Arthritis   . Atrial fibrillation     Past Surgical History  Procedure Laterality Date  . Appendectomy      1970s  . Abdominal hysterectomy      with oopherectomy for incidenctal cyst 1970s (no PMH of abnormal PAP)  . Cystoscopy       multiple since tumor excision;Dr Ottelin  . Bladder tumor excision  2011 & 2013    Dr Karsten Ro  . Cataract extraction, bilateral  2014    Dr Herbert Deaner  . Colonoscopy      negative; Dr Olevia Perches    Social History   Social History  . Marital Status: Widowed    Spouse Name: N/A  . Number of Children: 2  . Years of Education: N/A   Occupational History  . Retired    Social History Main Topics  . Smoking status: Never Smoker   . Smokeless tobacco: Never Used  . Alcohol Use: No  . Drug Use: No  . Sexual Activity: No   Other Topics Concern  . Not on file   Social History Narrative    Family History  Problem Relation Age of Onset  . Stroke Mother 37  . Heart attack Father 5  . Coronary artery disease Brother     S/P CBAG ? @ 60  . Diabetes Brother   . Kidney disease Neg Hx   . Cancer Neg Hx   . Colon cancer Neg Hx   . Colon polyps Neg Hx     Review of Systems  Constitutional: Negative for fever, appetite change, fatigue and unexpected weight change.  HENT: Positive for tinnitus. Negative for hearing loss.   Eyes: Negative for visual disturbance.  Respiratory: Negative for cough, shortness of breath and wheezing.   Cardiovascular: Negative for chest pain, palpitations and leg swelling.  Gastrointestinal: Negative for nausea, abdominal pain, diarrhea, constipation and blood in stool.       No gerd  Genitourinary: Negative for  dysuria and hematuria.  Musculoskeletal: Positive for back pain (occasion). Negative for myalgias.  Skin: Negative for color change and rash.  Neurological: Negative for dizziness, light-headedness and headaches.  Psychiatric/Behavioral: Negative for dysphoric mood. The patient is not nervous/anxious.        Objective:   Filed Vitals:   12/16/15 1020  BP: 178/80  Pulse: 68  Temp: 97.8 F (36.6 C)  Resp: 16   Filed Weights   12/16/15 1020  Weight: 102 lb (46.267 kg)   Body mass  index is 17.5 kg/(m^2).   Physical Exam Constitutional: She appears well-developed and well-nourished. No distress.  HENT:  Head: Normocephalic and atraumatic.  Right Ear: External ear normal. Normal ear canal and TM Left Ear: External ear normal.  Normal ear canal and TM Mouth/Throat: Oropharynx is clear and moist.  Eyes: Conjunctivae and EOM are normal.  Neck: Neck supple. No tracheal deviation present. No thyromegaly present.  No carotid bruit  Cardiovascular: Normal rate, regular rhythm and normal heart sounds.   No murmur heard.  No edema. Pulmonary/Chest: Effort normal and breath sounds normal. No respiratory distress. She has no wheezes. She has no rales.  Breast: deferred to Gyn Abdominal: Soft. She exhibits no distension. There is no tenderness.  Lymphadenopathy: She has no cervical adenopathy.  Skin: Skin is warm and dry. She is not diaphoretic.  Psychiatric: She has a normal mood and affect. Her behavior is normal.         Assessment & Plan:   Wellness Exam: Immunizations  prevnar today, will call insurance co re: tdap Colonoscopy - not needed at her age 11  Up to date  dexa   Up to date Eye exam  Up to date  Hearing loss - none Memory concerns/difficulties - recall only -- normal for age, no concerns Independent of ADLs -- fully independent and very active   Patient received copy of preventative screening tests/immunizations recommended for the next 5-10  years.   Physical exam: Screening blood work ordered Immunizations  prevnar today, will check into tetanus with insurance company Colonoscopy - not need  Mammogram  Up to date  Dexa Up to date  Eye exams  Up to date  [facility] [employer] [facility], does all of the house and yard work Weight - BMI on low side, but stabel Skin  - sees derm annually Substance abuse - no evidence of substance abuse  See Problem List for Assessment and Plan of chronic medical problems.   Follow up annually

## 2015-12-16 NOTE — Assessment & Plan Note (Signed)
Has white coat htn Monitors BP at home and it is well controlled Current regimen effective and well tolerated Continue current medications at current doses

## 2015-12-17 ENCOUNTER — Other Ambulatory Visit (INDEPENDENT_AMBULATORY_CARE_PROVIDER_SITE_OTHER): Payer: Medicare Other

## 2015-12-17 DIAGNOSIS — R739 Hyperglycemia, unspecified: Secondary | ICD-10-CM | POA: Diagnosis not present

## 2015-12-17 LAB — HEMOGLOBIN A1C: Hgb A1c MFr Bld: 5.6 % (ref 4.6–6.5)

## 2016-01-04 ENCOUNTER — Other Ambulatory Visit: Payer: Self-pay | Admitting: Emergency Medicine

## 2016-01-04 DIAGNOSIS — I10 Essential (primary) hypertension: Secondary | ICD-10-CM

## 2016-01-04 MED ORDER — METOPROLOL TARTRATE 25 MG PO TABS: 25.0000 mg | ORAL_TABLET | Freq: Two times a day (BID) | ORAL | Status: DC

## 2016-02-08 LAB — HM MAMMOGRAPHY

## 2016-02-16 ENCOUNTER — Encounter: Payer: Self-pay | Admitting: Internal Medicine

## 2016-04-27 ENCOUNTER — Ambulatory Visit (INDEPENDENT_AMBULATORY_CARE_PROVIDER_SITE_OTHER): Payer: Medicare Other

## 2016-04-27 DIAGNOSIS — Z23 Encounter for immunization: Secondary | ICD-10-CM | POA: Diagnosis not present

## 2016-06-02 DIAGNOSIS — H35363 Drusen (degenerative) of macula, bilateral: Secondary | ICD-10-CM | POA: Diagnosis not present

## 2016-06-02 DIAGNOSIS — H353212 Exudative age-related macular degeneration, right eye, with inactive choroidal neovascularization: Secondary | ICD-10-CM | POA: Diagnosis not present

## 2016-06-02 DIAGNOSIS — H353222 Exudative age-related macular degeneration, left eye, with inactive choroidal neovascularization: Secondary | ICD-10-CM | POA: Diagnosis not present

## 2016-06-02 DIAGNOSIS — H353133 Nonexudative age-related macular degeneration, bilateral, advanced atrophic without subfoveal involvement: Secondary | ICD-10-CM | POA: Diagnosis not present

## 2016-10-12 DIAGNOSIS — L821 Other seborrheic keratosis: Secondary | ICD-10-CM | POA: Diagnosis not present

## 2016-10-12 DIAGNOSIS — D692 Other nonthrombocytopenic purpura: Secondary | ICD-10-CM | POA: Diagnosis not present

## 2016-10-12 DIAGNOSIS — L570 Actinic keratosis: Secondary | ICD-10-CM | POA: Diagnosis not present

## 2016-10-12 DIAGNOSIS — D2271 Melanocytic nevi of right lower limb, including hip: Secondary | ICD-10-CM | POA: Diagnosis not present

## 2016-10-12 DIAGNOSIS — L308 Other specified dermatitis: Secondary | ICD-10-CM | POA: Diagnosis not present

## 2016-10-12 DIAGNOSIS — D2261 Melanocytic nevi of right upper limb, including shoulder: Secondary | ICD-10-CM | POA: Diagnosis not present

## 2016-10-13 DIAGNOSIS — H353212 Exudative age-related macular degeneration, right eye, with inactive choroidal neovascularization: Secondary | ICD-10-CM | POA: Diagnosis not present

## 2016-10-13 DIAGNOSIS — H353222 Exudative age-related macular degeneration, left eye, with inactive choroidal neovascularization: Secondary | ICD-10-CM | POA: Diagnosis not present

## 2016-10-13 DIAGNOSIS — H353133 Nonexudative age-related macular degeneration, bilateral, advanced atrophic without subfoveal involvement: Secondary | ICD-10-CM | POA: Diagnosis not present

## 2016-10-13 DIAGNOSIS — H35363 Drusen (degenerative) of macula, bilateral: Secondary | ICD-10-CM | POA: Diagnosis not present

## 2016-10-26 DIAGNOSIS — L570 Actinic keratosis: Secondary | ICD-10-CM | POA: Diagnosis not present

## 2016-12-20 ENCOUNTER — Other Ambulatory Visit: Payer: Self-pay | Admitting: Internal Medicine

## 2017-01-09 ENCOUNTER — Other Ambulatory Visit: Payer: Self-pay | Admitting: Internal Medicine

## 2017-01-09 DIAGNOSIS — I10 Essential (primary) hypertension: Secondary | ICD-10-CM

## 2017-01-10 DIAGNOSIS — C670 Malignant neoplasm of trigone of bladder: Secondary | ICD-10-CM | POA: Diagnosis not present

## 2017-01-23 ENCOUNTER — Encounter: Payer: Self-pay | Admitting: Internal Medicine

## 2017-01-23 NOTE — Progress Notes (Signed)
Subjective:    Patient ID: [name], female    DOB: 08-May-1930, 83 y.o.   MRN: [id number]  HPI Here for a subsequent medicare wellness exam and an annual physical exam  I have personally reviewed and have noted 1.The patient's medical and social history 2.Their use of alcohol, tobacco or illicit drugs 3.Their current medications and supplements 4.The patient's functional ability including ADL's, fall risks, home safety risks and hearing or visual impairment. 5.Diet and physical activities 6.Evidence for depression or mood disorders 7.Care team reviewed - derm - Dr Ubaldo Glassing,  Urology- Karsten Ro, eye - Dr Zadie Rhine   Are there smokers in your home (other than you)? No  Risk Factors Exercise:  [employer] 4-5 days a week - walks, machines Dietary issues discussed: well balanced  Cardiac risk factors: advanced age, hypertension  Depression Screen  Have you felt down, depressed or hopeless? No  Have you felt little interest or pleasure in doing things?  No  Activities of Daily Living In your present state of health, do you have any difficulty performing the following activities?:  Driving? No Managing money?  No Feeding yourself? No Getting from bed to chair? No Climbing a flight of stairs? No Preparing food and eating?: No Bathing or showering? No Getting dressed: No Getting to/using the toilet? No Moving around from place to place: No In the past year have you fallen or had a near fall?: No   Are you sexually active?  No     Hearing Difficulties:   Do you often ask people to speak up or repeat themselves? No Do you experience ringing or noises in your ears? No Do you have difficulty understanding soft or whispered voices? yes Vision:              Any change in vision:  no             Up to date with eye exam:  yes Memory:  Do you feel that you have a problem with memory? No  Do you often misplace items?  No  Do you feel safe at home?  Yes  Cognitive Testing  Alert, Orientated? Yes  Normal Appearance? Yes  Recall of three objects?  Yes  Can perform simple calculations? Yes  Displays appropriate judgment? Yes  Can read the correct time from a watch face? Yes   Advanced Directives have been discussed with the patient? Yes - in place   Medications and allergies reviewed with patient and updated if appropriate.  Patient Active Problem List   Diagnosis Date Noted  . Macular degeneration (senile) of retina 06/14/2010  . Vitamin D deficiency 03/10/2009  . Osteoporosis 03/10/2009  . NEOPLASM, MALIGNANT, BLADDER, HX OF 03/10/2009  . 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  . Hyperglycemia 06/26/2008  . Essential hypertension 08/09/2007  . ATRIAL FIBRILLATION WITH RAPID VENTRICULAR RESPONSE 08/09/2007    Current Outpatient Prescriptions on File Prior to Visit  Medication Sig Dispense Refill  . amLODipine (NORVASC) 5 MG tablet Take 1 tablet (5 mg total) by mouth daily. --- Office visit needed for further refills 90 tablet 0  . aspirin 81 MG tablet Take 81 mg by mouth daily.    . Bilberry, Vaccinium myrtillus, (BILBERRY PO) Take by mouth daily.    Marland Kitchen CALCIUM CARBONATE PO Take 1,200 mg by mouth daily. Reported on 12/16/2015    . magnesium gluconate (MAGONATE) 500 MG tablet Take 500 mg by mouth daily. Reported on 12/16/2015    . metoprolol tartrate (LOPRESSOR) 25 MG  tablet Take 1 tablet (25 mg total) by mouth 2 (two) times daily. --- Office visit needed for further refills 180 tablet 0  . Misc Natural Products (LUTEIN 20 PO) Take 20 mg by mouth daily.    . Multiple Vitamins-Minerals (VISION-VITE PRESERVE PO) Take 1 tablet by mouth daily.    . multivitamin-lutein (OCUVITE-LUTEIN) CAPS capsule Take 1 capsule by mouth daily.    . Omega-3 Fatty Acids (FISH OIL) 1000 MG CAPS Take 1 capsule by mouth daily.    Marland Kitchen tretinoin (RETIN-A) 0.05 % cream APPLY ON THE SKIN DAILY  2   No current facility-administered  medications on file prior to visit.     Past Medical History:  Diagnosis Date  . Arthritis   . Atrial fibrillation ([place])   . Cancer [facility])    low grade papillary, most recent recurrence 2011,  Dr Karsten Ro  . 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 Hypertension   . Hyponatremia 01/26/12   Na+ 131  . Other abnormal glucose 06/14/12   Fasting blood glucose 128; A1c 5.8% on 12/15/11    Past Surgical History:  Procedure Laterality Date  . 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 APPENDECTOMY     1970s  . BLADDER TUMOR EXCISION  2011 & 2013   Dr Karsten Ro  . CATARACT EXTRACTION, BILATERAL  2014   Dr Herbert Deaner  . COLONOSCOPY     negative; Dr Olevia Perches  . CYSTOSCOPY      multiple since tumor excision;Dr Ottelin    Social History   Social History  . Marital status: Widowed    Spouse name: N/A  . Number of children: 2  . Years of education: N/A   Occupational History  . Retired    Social History Main Topics  . Smoking status: Never Smoker  . Smokeless tobacco: Never Used  . Alcohol use No  . Drug use: No  . Sexual activity: No   Other Topics Concern  . Not on file   Social History Narrative  . No narrative on file    Family History  Problem Relation Age of Onset  . Stroke Mother 45  . Heart attack Father 39  . Coronary artery disease Brother        S/P CBAG ? @ 60  . Diabetes Brother   . Kidney disease Neg Hx   . Cancer Neg Hx   . Colon cancer Neg Hx   . Colon polyps Neg Hx     Review of Systems  Constitutional: Negative for appetite change, chills, fatigue and fever.  HENT: Positive for hearing loss (mild). Negative for tinnitus.   Eyes: Negative for visual disturbance.  Respiratory: Negative for cough, shortness of breath and wheezing.   Cardiovascular: Negative for chest pain, palpitations and leg swelling.  Gastrointestinal: Negative for abdominal pain, blood in stool, constipation,  diarrhea and nausea.       No gerd  Genitourinary: Negative for dysuria and hematuria.  Musculoskeletal: Positive for back pain (occasional).  Skin: Negative for rash.  Neurological: Negative for light-headedness and headaches.  Psychiatric/Behavioral: Negative for dysphoric mood. The patient is not nervous/anxious.        Objective:   Vitals:   01/24/17 0905  BP: (!) 156/74  Pulse: 60  Resp: 16  Temp: 97.8 F (36.6 C)   Filed Weights   01/24/17 0905  Weight: 100 lb (45.4 kg)   Body mass index  is 17.16 kg/m.  Wt Readings from Last 3 Encounters:  01/24/17 100 lb (45.4 kg)  12/16/15 102 lb (46.3 kg)  01/13/15 106 lb (48.1 kg)     Physical Exam Constitutional: She appears well-developed and well-nourished. No distress.  HENT:  Head: Normocephalic and atraumatic.  Right Ear: External ear normal. Excessive cerumen in ear canal, TM not visualized Left Ear: External ear normal.  Normal ear canal and TM Mouth/Throat: Oropharynx is clear and moist.  Eyes: Conjunctivae and EOM are normal.  Neck: Neck supple. No tracheal deviation present. No thyromegaly present.  No carotid bruit  Cardiovascular: Normal rate, regular rhythm and normal heart sounds.   No murmur heard.  No edema. Pulmonary/Chest: Effort normal and breath sounds normal. No respiratory distress. She has no wheezes. She has no rales.  Breast: deferred  Abdominal: Soft. She exhibits no distension. There is no tenderness.  Lymphadenopathy: She has no cervical adenopathy.  Skin: Skin is warm and dry. She is not diaphoretic.  Psychiatric: She has a normal mood and affect. Her behavior is normal.         Assessment & Plan:   Wellness Exam: Immunizations   Td due -  Given today, eligible for shingrix (zostavax 2012) Colonoscopy -  No longer needs due to age 41 - up to date, but no longer needs due to age Dexa -  Last done 2015 - has OP - due now Eye exam   Up to date  Hearing loss   Mild - does not  want audiology eval  Memory concerns/difficulties   none Independent of ADLs  fully Stressed the importance of regular exercise   Patient received copy of preventative screening tests/immunizations recommended for the next 5-10 years.   Physical exam: Screening blood work Immunizations   Td due - given today, eligible for shingrix (zostavax 2012) Colonoscopy -  No longer needs due to age 56 - up to date, but no longer needs due to age Dexa -  Last done 2015 - has OP - due now Eye exams   Up to date  EKG - last done 2013 - no concerning symptoms - will hold off on EKG today Exercise  - regular Weight  - on low side - advised increasing protein, consider ensure of boost as a supplement Skin   No concerns, sees derm Substance abuse   none  See Problem List for Assessment and Plan of chronic medical problems.   FU annually

## 2017-01-24 ENCOUNTER — Encounter: Payer: Self-pay | Admitting: Internal Medicine

## 2017-01-24 ENCOUNTER — Ambulatory Visit (INDEPENDENT_AMBULATORY_CARE_PROVIDER_SITE_OTHER): Payer: Medicare Other | Admitting: Internal Medicine

## 2017-01-24 ENCOUNTER — Other Ambulatory Visit (INDEPENDENT_AMBULATORY_CARE_PROVIDER_SITE_OTHER): Payer: Medicare Other

## 2017-01-24 VITALS — BP 156/74 | HR 60 | Temp 97.8°F | Resp 16 | Wt 100.0 lb

## 2017-01-24 DIAGNOSIS — Z8551 Personal history of malignant neoplasm of bladder: Secondary | ICD-10-CM

## 2017-01-24 DIAGNOSIS — I4891 Unspecified atrial fibrillation: Secondary | ICD-10-CM

## 2017-01-24 DIAGNOSIS — R739 Hyperglycemia, unspecified: Secondary | ICD-10-CM

## 2017-01-24 DIAGNOSIS — I10 Essential (primary) hypertension: Secondary | ICD-10-CM

## 2017-01-24 DIAGNOSIS — H3530 Unspecified macular degeneration: Secondary | ICD-10-CM | POA: Diagnosis not present

## 2017-01-24 DIAGNOSIS — Z23 Encounter for immunization: Secondary | ICD-10-CM

## 2017-01-24 DIAGNOSIS — M810 Age-related osteoporosis without current pathological fracture: Secondary | ICD-10-CM

## 2017-01-24 DIAGNOSIS — E559 Vitamin D deficiency, unspecified: Secondary | ICD-10-CM

## 2017-01-24 DIAGNOSIS — Z0000 Encounter for general adult medical examination without abnormal findings: Secondary | ICD-10-CM

## 2017-01-24 LAB — COMPREHENSIVE METABOLIC PANEL
ALBUMIN: 4.1 g/dL (ref 3.5–5.2)
ALK PHOS: 70 U/L (ref 39–117)
ALT: 14 U/L (ref 0–35)
AST: 18 U/L (ref 0–37)
BUN: 12 mg/dL (ref 6–23)
CO2: 30 mEq/L (ref 19–32)
Calcium: 9.9 mg/dL (ref 8.4–10.5)
Chloride: 96 mEq/L (ref 96–112)
Creatinine, Ser: 0.77 mg/dL (ref 0.40–1.20)
GFR: 75.72 mL/min (ref >60.00)
Glucose, Bld: 119 mg/dL — ABNORMAL HIGH (ref 70–99)
POTASSIUM: 4.9 meq/L (ref 3.5–5.1)
SODIUM: 134 meq/L — AB (ref 135–145)
TOTAL PROTEIN: 7.1 g/dL (ref 6.0–8.3)
Total Bilirubin: 0.6 mg/dL (ref 0.2–1.2)

## 2017-01-24 LAB — LIPID PANEL
CHOLESTEROL: 209 mg/dL — AB (ref 0–200)
HDL: 80 mg/dL (ref >39.00)
LDL Cholesterol: 115 mg/dL — ABNORMAL HIGH (ref 0–99)
NonHDL: 129.06
Total CHOL/HDL Ratio: 3
Triglycerides: 68 mg/dL (ref 0.0–149.0)
VLDL: 13.6 mg/dL (ref 0.0–40.0)

## 2017-01-24 LAB — CBC WITH DIFFERENTIAL/PLATELET
Basophils Absolute: 0 10*3/uL (ref 0.0–0.1)
Basophils Relative: 1 % (ref 0.0–3.0)
EOS PCT: 2.2 % (ref 0.0–5.0)
Eosinophils Absolute: 0.1 10*3/uL (ref 0.0–0.7)
HCT: 41.4 % (ref 36.0–46.0)
HEMOGLOBIN: 13.9 g/dL (ref 12.0–15.0)
Lymphocytes Relative: 23.6 % (ref 12.0–46.0)
Lymphs Abs: 1.1 10*3/uL (ref 0.7–4.0)
MCHC: 33.5 g/dL (ref 30.0–36.0)
MCV: 94.4 fl (ref 78.0–100.0)
MONOS PCT: 11.1 % (ref 3.0–12.0)
Monocytes Absolute: 0.5 10*3/uL (ref 0.1–1.0)
Neutro Abs: 2.8 10*3/uL (ref 1.4–7.7)
Neutrophils Relative %: 62.1 % (ref 43.0–77.0)
Platelets: 309 10*3/uL (ref 150.0–400.0)
RBC: 4.38 Mil/uL (ref 3.87–5.11)
RDW: 13 % (ref 11.5–15.5)
WBC: 4.5 10*3/uL (ref 4.0–10.5)

## 2017-01-24 LAB — HEMOGLOBIN A1C: HEMOGLOBIN A1C: 5.8 % (ref 4.6–6.5)

## 2017-01-24 LAB — VITAMIN D 25 HYDROXY (VIT D DEFICIENCY, FRACTURES): VITD: 31.95 ng/mL (ref 30.00–100.00)

## 2017-01-24 LAB — TSH: TSH: 2.54 u[IU]/mL (ref 0.35–4.50)

## 2017-01-24 MED ORDER — ZOSTER VAC RECOMB ADJUVANTED 50 MCG/0.5ML IM SUSR: 0.5000 mL | Freq: Once | INTRAMUSCULAR | 1 refills | Status: AC

## 2017-01-24 NOTE — Assessment & Plan Note (Signed)
Check a1c 

## 2017-01-24 NOTE — Patient Instructions (Addendum)
[name] , Thank you for taking time to come for your Medicare Wellness Visit. I appreciate your ongoing commitment to your health goals. Please review the following plan we discussed and let me know if I can assist you in the future.   These are the goals we discussed: Goals    None      This is a list of the screening recommended for you and due dates:  Health Maintenance  Topic Date Due  . Tetanus Vaccine  Given today  . DEXA scan (bone density measurement)  08/16/2015  . Flu Shot  02/15/2017  . Pneumonia vaccines  Completed     Test(s) ordered today. Your results will be released to [place] (or called to you) after review, usually within 72hours after test completion. If any changes need to be made, you will be notified at that same time.  All other Health Maintenance issues reviewed.   All recommended immunizations and age-appropriate screenings are up-to-date or discussed.  Tetanus immunization administered today.   Medications reviewed and updated.   No changes recommended at this time.   Please followup in one year, sooner if needed   Health Maintenance, Female Adopting a healthy lifestyle and getting preventive care can go a long way to promote health and wellness. Talk with your health care provider about what schedule of regular examinations is right for you. This is a good chance for you to check in with your provider about disease prevention and staying healthy. In between checkups, there are plenty of things you can do on your own. Experts have done a lot of research about which lifestyle changes and preventive measures are most likely to keep you healthy. Ask your health care provider for more information. Weight and diet Eat a healthy diet  Be sure to include plenty of vegetables, fruits, low-fat dairy products, and lean protein.  Do not eat a lot of foods high in solid fats, added sugars, or salt.  Get regular exercise. This is one of the most important  things you can do for your health. ? Most adults should exercise for at least 150 minutes each week. The exercise should increase your heart rate and make you sweat (moderate-intensity exercise). ? Most adults should also do strengthening exercises at least twice a week. This is in addition to the moderate-intensity exercise.  Maintain a healthy weight  Body mass index (BMI) is a measurement that can be used to identify possible weight problems. It estimates body fat based on height and weight. Your health care provider can help determine your BMI and help you achieve or maintain a healthy weight.  For females 78 years of age and older: ? A BMI below 18.5 is considered underweight. ? A BMI of 18.5 to 24.9 is normal. ? A BMI of 25 to 29.9 is considered overweight. ? A BMI of 30 and above is considered obese.  Watch levels of cholesterol and blood lipids  You should start having your blood tested for lipids and cholesterol at 81 years of age, then have this test every 5 years.  You may need to have your cholesterol levels checked more often if: ? Your lipid or cholesterol levels are high. ? You are older than 81 years of age. ? You are at high risk for heart disease.  Cancer screening Lung Cancer  Lung cancer screening is recommended for adults 36-35 years old who are at high risk for lung cancer because of a history of smoking.  A  yearly low-dose CT scan of the lungs is recommended for people who: ? Currently smoke. ? Have quit within the past 15 years. ? Have at least a 30-pack-year history of smoking. A pack year is smoking an average of one pack of cigarettes a day for 1 year.  Yearly screening should continue until it has been 15 years since you quit.  Yearly screening should stop if you develop a health problem that would prevent you from having lung cancer treatment.  Breast Cancer  Practice breast self-awareness. This means understanding how your breasts normally appear  and feel.  It also means doing regular breast self-exams. Let your health care provider know about any changes, no matter how small.  If you are in your 20s or 30s, you should have a clinical breast exam (CBE) by a health care provider every 1-3 years as part of a regular health exam.  If you are 70 or older, have a CBE every year. Also consider having a breast X-ray (mammogram) every year.  If you have a family history of breast cancer, talk to your health care provider about genetic screening.  If you are at high risk for breast cancer, talk to your health care provider about having an MRI and a mammogram every year.  Breast cancer gene (BRCA) assessment is recommended for women who have family members with BRCA-related cancers. BRCA-related cancers include: ? Breast. ? Ovarian. ? Tubal. ? Peritoneal cancers.  Results of the assessment will determine the need for genetic counseling and BRCA1 and BRCA2 testing.  Cervical Cancer Your health care provider may recommend that you be screened regularly for cancer of the pelvic organs (ovaries, uterus, and vagina). This screening involves a pelvic examination, including checking for microscopic changes to the surface of your cervix (Pap test). You may be encouraged to have this screening done every 3 years, beginning at age 39.  For women ages 80-65, health care providers may recommend pelvic exams and Pap testing every 3 years, or they may recommend the Pap and pelvic exam, combined with testing for human papilloma virus (HPV), every 5 years. Some types of HPV increase your risk of cervical cancer. Testing for HPV may also be done on women of any age with unclear Pap test results.  Other health care providers may not recommend any screening for nonpregnant women who are considered low risk for pelvic cancer and who do not have symptoms. Ask your health care provider if a screening pelvic exam is right for you.  If you have had past treatment  for cervical cancer or a condition that could lead to cancer, you need Pap tests and screening for cancer for at least 20 years after your treatment. If Pap tests have been discontinued, your risk factors (such as having a new sexual partner) need to be reassessed to determine if screening should resume. Some women have medical problems that increase the chance of getting cervical cancer. In these cases, your health care provider may recommend more frequent screening and Pap tests.  Colorectal Cancer  This type of cancer can be detected and often prevented.  Routine colorectal cancer screening usually begins at 81 years of age and continues through 81 years of age.  Your health care provider may recommend screening at an earlier age if you have risk factors for colon cancer.  Your health care provider may also recommend using home test kits to check for hidden blood in the stool.  A small camera at the end of  a tube can be used to examine your colon directly (sigmoidoscopy or colonoscopy). This is done to check for the earliest forms of colorectal cancer.  Routine screening usually begins at age 61.  Direct examination of the colon should be repeated every 5-10 years through 81 years of age. However, you may need to be screened more often if early forms of precancerous polyps or small growths are found.  Skin Cancer  Check your skin from head to toe regularly.  Tell your health care provider about any new moles or changes in moles, especially if there is a change in a mole's shape or color.  Also tell your health care provider if you have a mole that is larger than the size of a pencil eraser.  Always use sunscreen. Apply sunscreen liberally and repeatedly throughout the day.  Protect yourself by wearing long sleeves, pants, a wide-brimmed hat, and sunglasses whenever you are outside.  Heart disease, diabetes, and high blood pressure  High blood pressure causes heart disease and  increases the risk of stroke. High blood pressure is more likely to develop in: ? People who have blood pressure in the high end of the normal range (130-139/85-89 mm Hg). ? People who are overweight or obese. ? People who are African American.  If you are 41-60 years of age, have your blood pressure checked every 3-5 years. If you are 1 years of age or older, have your blood pressure checked every year. You should have your blood pressure measured twice-once when you are at a hospital or clinic, and once when you are not at a hospital or clinic. Record the average of the two measurements. To check your blood pressure when you are not at a hospital or clinic, you can use: ? An automated blood pressure machine at a pharmacy. ? A home blood pressure monitor.  If you are between 40 years and 24 years old, ask your health care provider if you should take aspirin to prevent strokes.  Have regular diabetes screenings. This involves taking a blood sample to check your fasting blood sugar level. ? If you are at a normal weight and have a low risk for diabetes, have this test once every three years after 81 years of age. ? If you are overweight and have a high risk for diabetes, consider being tested at a younger age or more often. Preventing infection Hepatitis B  If you have a higher risk for hepatitis B, you should be screened for this virus. You are considered at high risk for hepatitis B if: ? You were born in a country where hepatitis B is common. Ask your health care provider which countries are considered high risk. ? Your parents were born in a high-risk country, and you have not been immunized against hepatitis B (hepatitis B vaccine). ? You have HIV or AIDS. ? You use needles to inject street drugs. ? You live with someone who has hepatitis B. ? You have had sex with someone who has hepatitis B. ? You get hemodialysis treatment. ? You take certain medicines for conditions, including  cancer, organ transplantation, and autoimmune conditions.  Hepatitis C  Blood testing is recommended for: ? Everyone born from [birth year] through 1963. ? Anyone with known risk factors for hepatitis C.  Sexually transmitted infections (STIs)  You should be screened for sexually transmitted infections (STIs) including gonorrhea and chlamydia if: ? You are sexually active and are younger than 81 years of age. ? You are older  than 81 years of age and your health care provider tells you that you are at risk for this type of infection. ? Your sexual activity has changed since you were last screened and you are at an increased risk for chlamydia or gonorrhea. Ask your health care provider if you are at risk.  If you do not have HIV, but are at risk, it may be recommended that you take a prescription medicine daily to prevent HIV infection. This is called pre-exposure prophylaxis (PrEP). You are considered at risk if: ? You are sexually active and do not regularly use condoms or know the HIV status of your partner(s). ? You take drugs by injection. ? You are sexually active with a partner who has HIV.  Talk with your health care provider about whether you are at high risk of being infected with HIV. If you choose to begin PrEP, you should first be tested for HIV. You should then be tested every 3 months for as long as you are taking PrEP. Pregnancy  If you are premenopausal and you may become pregnant, ask your health care provider about preconception counseling.  If you may become pregnant, take 400 to 800 micrograms (mcg) of folic acid every day.  If you want to prevent pregnancy, talk to your health care provider about birth control (contraception). Osteoporosis and menopause  Osteoporosis is a disease in which the bones lose minerals and strength with aging. This can result in serious bone fractures. Your risk for osteoporosis can be identified using a bone density scan.  If you are 19 years  of age or older, or if you are at risk for osteoporosis and fractures, ask your health care provider if you should be screened.  Ask your health care provider whether you should take a calcium or vitamin D supplement to lower your risk for osteoporosis.  Menopause may have certain physical symptoms and risks.  Hormone replacement therapy may reduce some of these symptoms and risks. Talk to your health care provider about whether hormone replacement therapy is right for you. Follow these instructions at home:  Schedule regular health, dental, and eye exams.  Stay current with your immunizations.  Do not use any tobacco products including cigarettes, chewing tobacco, or electronic cigarettes.  If you are pregnant, do not drink alcohol.  If you are breastfeeding, limit how much and how often you drink alcohol.  Limit alcohol intake to no more than 1 drink per day for nonpregnant women. One drink equals 12 ounces of beer, 5 ounces of wine, or 1 ounces of hard liquor.  Do not use street drugs.  Do not share needles.  Ask your health care provider for help if you need support or information about quitting drugs.  Tell your health care provider if you often feel depressed.  Tell your health care provider if you have ever been abused or do not feel safe at home. This information is not intended to replace advice given to you by your health care provider. Make sure you discuss any questions you have with your health care provider. Document Released: 01/17/2011 Document Revised: 12/10/2015 Document Reviewed: 04/07/2015 Elsevier Interactive Patient Education  [employer].

## 2017-01-24 NOTE — Assessment & Plan Note (Signed)
Well controlled at home Mild white coat htn Current regimen effective and well tolerated Continue current medications at current doses

## 2017-01-24 NOTE — Assessment & Plan Note (Signed)
dexa ordered Continue vitamin d and calcium Check vitamin d level Continue regular exercise

## 2017-01-24 NOTE — Assessment & Plan Note (Signed)
Check vitamin d level given OP

## 2017-01-24 NOTE — Assessment & Plan Note (Signed)
Up to date with Dr Karsten Ro

## 2017-01-24 NOTE — Assessment & Plan Note (Signed)
Stable Up to date with eye exams

## 2017-01-24 NOTE — Assessment & Plan Note (Signed)
In sinus on exam

## 2017-02-09 DIAGNOSIS — Z1231 Encounter for screening mammogram for malignant neoplasm of breast: Secondary | ICD-10-CM | POA: Diagnosis not present

## 2017-02-09 LAB — HM MAMMOGRAPHY

## 2017-02-28 ENCOUNTER — Encounter: Payer: Self-pay | Admitting: Internal Medicine

## 2017-03-23 ENCOUNTER — Ambulatory Visit (INDEPENDENT_AMBULATORY_CARE_PROVIDER_SITE_OTHER)
Admission: RE | Admit: 2017-03-23 | Discharge: 2017-03-23 | Disposition: A | Discharge status: Home / Self Care | Payer: Medicare Other | Source: Ambulatory Visit | Attending: Internal Medicine | Admitting: Internal Medicine

## 2017-03-23 DIAGNOSIS — M810 Age-related osteoporosis without current pathological fracture: Secondary | ICD-10-CM

## 2017-03-27 ENCOUNTER — Other Ambulatory Visit: Payer: Self-pay | Admitting: Internal Medicine

## 2017-04-08 ENCOUNTER — Other Ambulatory Visit: Payer: Self-pay | Admitting: Internal Medicine

## 2017-04-08 DIAGNOSIS — I10 Essential (primary) hypertension: Secondary | ICD-10-CM

## 2017-04-11 ENCOUNTER — Ambulatory Visit (INDEPENDENT_AMBULATORY_CARE_PROVIDER_SITE_OTHER): Payer: Medicare Other | Admitting: Internal Medicine

## 2017-04-11 ENCOUNTER — Encounter: Payer: Self-pay | Admitting: Internal Medicine

## 2017-04-11 VITALS — BP 162/64 | HR 68 | Temp 97.9°F | Resp 16 | Wt 98.8 lb

## 2017-04-11 DIAGNOSIS — M810 Age-related osteoporosis without current pathological fracture: Secondary | ICD-10-CM | POA: Diagnosis not present

## 2017-04-11 NOTE — Progress Notes (Signed)
Subjective:    Patient ID: [name], female    DOB: 1930-01-29, 83 y.o.   MRN: [id number]  HPI The patient is here for follow up on her bone density, which shows osteoporosis.  She is here with her son who is a IM PA, now retired.    Dexa 04/02/17: Results:  Lumbar spine L1-L4 Femoral neck (FN) 33% distal radius  T-score -3.6 RFN: -2.9 LFN: -2.4 n/a  Change in BMD from previous DXA test (%) n/a n/a n/a  (*) statistically significant  Assessment: Patient has OSTEOPOROSIS according to the [facility] classification for osteoporosis (see below). Fracture risk: high Comments: the technical quality of the study is good  Scoliosis and degenerative change may falsely elevate spine score Evaluation for secondary causes should be considered if clinically indicated.  Recommend optimizing calcium (1200 mg/day) and vitamin D (800 IU/day).   Her mother had osteoporosis.  She has been on fosamax in the past for 2 years.  She stopped it after having femur pain.  She is currently taking calcium and vitamin D daily.  Her vitamin d level was 32 in July 2018.  She is exercising - 4-5 times days a week - walks on treadmill, bikes, weights.    Medications and allergies reviewed with patient and updated if appropriate.  Patient Active Problem List   Diagnosis Date Noted  . Macular degeneration (senile) of retina 06/14/2010  . Vitamin D deficiency 03/10/2009  . Osteoporosis 03/10/2009  . NEOPLASM, MALIGNANT, BLADDER, HX OF 03/10/2009  . 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  . Hyperglycemia 06/26/2008  . Essential hypertension 08/09/2007  . ATRIAL FIBRILLATION WITH RAPID VENTRICULAR RESPONSE 08/09/2007    Current Outpatient Prescriptions on File Prior to Visit  Medication Sig Dispense Refill  . amLODipine (NORVASC) 5 MG tablet Take 1 tablet (5 mg total) by mouth daily. 90 tablet 1  . aspirin 81 MG tablet Take 81 mg by mouth daily.    . Bilberry, Vaccinium myrtillus, (BILBERRY PO) Take by mouth daily.     Marland Kitchen CALCIUM CARBONATE PO Take 1,200 mg by mouth daily. Reported on 12/16/2015    . magnesium gluconate (MAGONATE) 500 MG tablet Take 500 mg by mouth daily. Reported on 12/16/2015    . metoprolol tartrate (LOPRESSOR) 25 MG tablet Take 1 tablet (25 mg total) by mouth 2 (two) times daily. 180 tablet 1  . Misc Natural Products (LUTEIN 20 PO) Take 20 mg by mouth daily.    . Multiple Vitamins-Minerals (VISION-VITE PRESERVE PO) Take 1 tablet by mouth daily.    . multivitamin-lutein (OCUVITE-LUTEIN) CAPS capsule Take 1 capsule by mouth daily.    . Omega-3 Fatty Acids (FISH OIL) 1000 MG CAPS Take 1 capsule by mouth daily.    Marland Kitchen tretinoin (RETIN-A) 0.05 % cream APPLY ON THE SKIN DAILY  2   No current facility-administered medications on file prior to visit.     Past Medical History:  Diagnosis Date  . Arthritis   . Atrial fibrillation ([place])   . Cancer [facility])    low grade papillary, most recent recurrence 2011,  Dr Karsten Ro  . 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 Hypertension   . Hyponatremia 01/26/12   Na+ 131  . Other abnormal glucose 06/14/12   Fasting blood glucose 128; A1c 5.8% on 12/15/11    Past Surgical History:  Procedure Laterality Date  . 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 APPENDECTOMY  1970s  . BLADDER TUMOR EXCISION  2011 & 2013   Dr Karsten Ro  . CATARACT EXTRACTION, BILATERAL  2014   Dr Herbert Deaner  . COLONOSCOPY     negative; Dr Olevia Perches  . CYSTOSCOPY      multiple since tumor excision;Dr Ottelin    Social History   Social History  . Marital status: Widowed    Spouse name: N/A  . Number of children: 2  . Years of education: N/A   Occupational History  . Retired    Social History Main Topics  . Smoking status: Never Smoker  . Smokeless tobacco: Never Used  . Alcohol use No  . Drug use: No  . Sexual activity: No   Other Topics Concern  . Not on file   Social History Narrative  . No  narrative on file    Family History  Problem Relation Age of Onset  . Stroke Mother 85  . Heart attack Father 4  . Coronary artery disease Brother        S/P CBAG ? @ 60  . Diabetes Brother   . Kidney disease Neg Hx   . Cancer Neg Hx   . Colon cancer Neg Hx   . Colon polyps Neg Hx     Review of Systems     Objective:   Vitals:   04/11/17 1550  BP: (!) 162/64  Pulse: 68  Resp: 16  Temp: 97.9 F (36.6 C)  SpO2: 98%   Wt Readings from Last 3 Encounters:  04/11/17 98 lb 12 oz (44.8 kg)  01/24/17 100 lb (45.4 kg)  12/16/15 102 lb (46.3 kg)   Body mass index is 16.95 kg/m.   Physical Exam        Assessment & Plan:    25 minutes were spent face-to-face with the patient, over 50% of which was spent counseling regarding her diagnosis of osteoporosis, supplementation, exercise and prescription medications including pros/cons, side effects of each medication    See Problem List for Assessment and Plan of chronic medical problems.

## 2017-04-11 NOTE — Patient Instructions (Addendum)
Think about taking an osteoporosis medication.     Osteoporosis Osteoporosis is the thinning and loss of density in the bones. Osteoporosis makes the bones more brittle, fragile, and likely to break (fracture). Over time, osteoporosis can cause the bones to become so weak that they fracture after a simple fall. The bones most likely to fracture are the bones in the hip, wrist, and spine. What are the causes? The exact cause is not known. What increases the risk? Anyone can develop osteoporosis. You may be at greater risk if you have a family history of the condition or have poor nutrition. You may also have a higher risk if you are:  Female.  36 years old or older.  A smoker.  Not physically active.  White or Asian.  Slender.  What are the signs or symptoms? A fracture might be the first sign of the disease, especially if it results from a fall or injury that would not usually cause a bone to break. Other signs and symptoms include:  Low back and neck pain.  Stooped posture.  Height loss.  How is this diagnosed? To make a diagnosis, your health care provider may:  Take a medical history.  Perform a physical exam.  Order tests, such as: ? A bone mineral density test. ? A dual-energy X-ray absorptiometry test.  How is this treated? The goal of osteoporosis treatment is to strengthen your bones to reduce your risk of a fracture. Treatment may involve:  Making lifestyle changes, such as: ? Eating a diet rich in calcium. ? Doing weight-bearing and muscle-strengthening exercises. ? Stopping tobacco use. ? Limiting alcohol intake.  Taking medicine to slow the process of bone loss or to increase bone density.  Monitoring your levels of calcium and vitamin D.  Follow these instructions at home:  Include calcium and vitamin D in your diet. Calcium is important for bone health, and vitamin D helps the body absorb calcium.  Perform weight-bearing and  muscle-strengthening exercises as directed by your health care provider.  Do not use any tobacco products, including cigarettes, chewing tobacco, and electronic cigarettes. If you need help quitting, ask your health care provider.  Limit your alcohol intake.  Take medicines only as directed by your health care provider.  Keep all follow-up visits as directed by your health care provider. This is important.  Take precautions at home to lower your risk of falling, such as: ? Keeping rooms well lit and clutter free. ? Installing safety rails on stairs. ? Using rubber mats in the bathroom and other areas that are often wet or slippery. Get help right away if: You fall or injure yourself. This information is not intended to replace advice given to you by your health care provider. Make sure you discuss any questions you have with your health care provider. Document Released: 04/13/2005 Document Revised: 12/07/2015 Document Reviewed: 12/12/2013 Elsevier Interactive Patient Education  2017 [employer].

## 2017-04-11 NOTE — Assessment & Plan Note (Signed)
Information given regarding osteoporosis.  Discussed calcium and vitamin D supplementation, importance of exercise, avoiding falls (not climbing up ladders or going up into the attic) Discussed risk of breaking something even without falling Discussed medications, including fosamax, prolia, reclast and forteo - discussed side effects vs benefits If getting significant dental work advised to discuss with dentist She will think about the option and let me know if she would like to start a medication Recheck dexa in 1-2 years depending on if she starts a medication

## 2017-04-13 DIAGNOSIS — H353212 Exudative age-related macular degeneration, right eye, with inactive choroidal neovascularization: Secondary | ICD-10-CM | POA: Diagnosis not present

## 2017-04-13 DIAGNOSIS — H04123 Dry eye syndrome of bilateral lacrimal glands: Secondary | ICD-10-CM | POA: Diagnosis not present

## 2017-04-13 DIAGNOSIS — H353133 Nonexudative age-related macular degeneration, bilateral, advanced atrophic without subfoveal involvement: Secondary | ICD-10-CM | POA: Diagnosis not present

## 2017-04-13 DIAGNOSIS — H353222 Exudative age-related macular degeneration, left eye, with inactive choroidal neovascularization: Secondary | ICD-10-CM | POA: Diagnosis not present

## 2017-04-13 DIAGNOSIS — H1851 Endothelial corneal dystrophy: Secondary | ICD-10-CM | POA: Diagnosis not present

## 2017-07-04 DIAGNOSIS — C670 Malignant neoplasm of trigone of bladder: Secondary | ICD-10-CM | POA: Diagnosis not present

## 2017-09-13 ENCOUNTER — Other Ambulatory Visit: Payer: Self-pay | Admitting: Emergency Medicine

## 2017-09-13 MED ORDER — AMLODIPINE BESYLATE 5 MG PO TABS: 5.0000 mg | ORAL_TABLET | Freq: Every day | ORAL | 0 refills | Status: DC

## 2017-10-02 ENCOUNTER — Other Ambulatory Visit: Payer: Self-pay | Admitting: Internal Medicine

## 2017-10-02 DIAGNOSIS — I10 Essential (primary) hypertension: Secondary | ICD-10-CM

## 2017-10-12 ENCOUNTER — Other Ambulatory Visit (INDEPENDENT_AMBULATORY_CARE_PROVIDER_SITE_OTHER): Payer: Medicare Other

## 2017-10-12 ENCOUNTER — Telehealth: Payer: Self-pay | Admitting: Family Medicine

## 2017-10-12 ENCOUNTER — Encounter: Payer: Self-pay | Admitting: Family Medicine

## 2017-10-12 ENCOUNTER — Ambulatory Visit: Payer: Medicare Other | Admitting: Family Medicine

## 2017-10-12 VITALS — BP 138/82 | HR 63 | Temp 97.8°F | Ht 64.0 in | Wt 102.0 lb

## 2017-10-12 DIAGNOSIS — R300 Dysuria: Secondary | ICD-10-CM | POA: Diagnosis not present

## 2017-10-12 LAB — URINALYSIS, ROUTINE W REFLEX MICROSCOPIC
Bilirubin Urine: NEGATIVE
Ketones, ur: NEGATIVE
NITRITE: NEGATIVE
SPECIFIC GRAVITY, URINE: 1.015 (ref 1.000–1.030)
Total Protein, Urine: NEGATIVE
URINE GLUCOSE: NEGATIVE
Urobilinogen, UA: 0.2 (ref 0.0–1.0)
pH: 7 (ref 5.0–8.0)

## 2017-10-12 MED ORDER — SULFAMETHOXAZOLE-TRIMETHOPRIM 800-160 MG PO TABS: 1.0000 | ORAL_TABLET | Freq: Two times a day (BID) | ORAL | 0 refills | Status: DC

## 2017-10-12 NOTE — Patient Instructions (Signed)
We will call you with the results from today.  

## 2017-10-12 NOTE — Assessment & Plan Note (Signed)
Possible for UTI. No prior urine culture  - UA and urine culture - will call with results and treatment if needed.

## 2017-10-12 NOTE — Progress Notes (Signed)
[name] - 84 y.o. female MRN [id number]  Date of birth: Dec 01, 1929  SUBJECTIVE:  Including CC & ROS.  Chief Complaint  Patient presents with  . Dysuria    [name] is a 84 y.o. female that is presenting with dysuria. Symptpoms began yesteday, admits to burning when she is urinating. Denies fevers or chills. Denies abdominal pain. She has not taken anything for her pain. She has been increasing her water intake.      Review of Systems  Constitutional: Negative for fever.  Genitourinary: Positive for dysuria. Negative for flank pain.    HISTORY: Past Medical, Surgical, Social, and Family History Reviewed & Updated per EMR.   Pertinent Historical Findings include:  Past Medical History:  Diagnosis Date  . Arthritis   . Atrial fibrillation ([place])   . Cancer [facility])    low grade papillary, most recent recurrence 2011,  Dr Karsten Ro  . 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 Hypertension   . Hyponatremia 01/26/12   Na+ 131  . Other abnormal glucose 06/14/12   Fasting blood glucose 128; A1c 5.8% on 12/15/11    Past Surgical History:  Procedure Laterality Date  . 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 APPENDECTOMY     1970s  . BLADDER TUMOR EXCISION  2011 & 2013   Dr Karsten Ro  . CATARACT EXTRACTION, BILATERAL  2014   Dr Herbert Deaner  . COLONOSCOPY     negative; Dr Olevia Perches  . CYSTOSCOPY      multiple since tumor excision;Dr Ottelin    No Known Allergies  Family History  Problem Relation Age of Onset  . Stroke Mother 53  . Heart attack Father 78  . Coronary artery disease Brother        S/P CBAG ? @ 60  . Diabetes Brother   . Kidney disease Neg Hx   . Cancer Neg Hx   . Colon cancer Neg Hx   . Colon polyps Neg Hx      Social History   Socioeconomic History  . Marital status: Widowed    Spouse name: Not on file  . Number of children: 2  . Years of education: Not on file  . Highest  education level: Not on file  Occupational History  . Occupation: Retired  Scientific laboratory technician  . Financial resource strain: Not on file  . Food insecurity:    Worry: Not on file    Inability: Not on file  . Transportation needs:    Medical: Not on file    Non-medical: Not on file  Tobacco Use  . Smoking status: Never Smoker  . Smokeless tobacco: Never Used  Substance and Sexual Activity  . Alcohol use: No    Alcohol/week: 0.0 oz  . Drug use: No  . Sexual activity: Never  Lifestyle  . Physical activity:    Days per week: Not on file    Minutes per session: Not on file  . Stress: Not on file  Relationships  . Social connections:    Talks on phone: Not on file    Gets together: Not on file    Attends religious service: Not on file    Active member of club or organization: Not on file    Attends meetings of clubs or organizations: Not on file    Relationship status: Not on file  . Intimate partner violence:    Fear of  current or ex partner: Not on file    Emotionally abused: Not on file    Physically abused: Not on file    Forced sexual activity: Not on file  Other Topics Concern  . Not on file  Social History Narrative  . Not on file     PHYSICAL EXAM:  VS: BP 138/82 (BP Location: Left Arm, Patient Position: Sitting, Cuff Size: Normal)   Pulse 63   Temp 97.8 F (36.6 C) (Oral)   Ht 5\' 4"  (1.626 m)   Wt 102 lb (46.3 kg)   SpO2 99%   BMI 17.51 kg/m  Physical Exam Gen: NAD, alert, cooperative with exam, well-appearing ENT: normal lips, normal nasal mucosa,  Eye: normal EOM, normal conjunctiva and lids CV:  no edema, +2 pedal pulses   Resp: no accessory muscle use, non-labored,  GI: no masses or tenderness, no hernia, no suprapubic tenderness.   Skin: no rashes, no areas of induration  Neuro: normal tone, normal sensation to touch Psych:  normal insight, alert and oriented MSK: normal gait, no CVA tenderness      ASSESSMENT & PLAN:   Dysuria Possible for  UTI. No prior urine culture  - UA and urine culture - will call with results and treatment if needed.

## 2017-10-12 NOTE — Telephone Encounter (Signed)
Informed of her results. Will send in emperic ABX.   Rosemarie Ax, MD [facility] 10/12/2017, 8:32 PM

## 2017-10-14 LAB — URINE CULTURE
MICRO NUMBER:: 90388085
SPECIMEN QUALITY:: ADEQUATE

## 2017-10-31 DIAGNOSIS — H1851 Endothelial corneal dystrophy: Secondary | ICD-10-CM | POA: Diagnosis not present

## 2017-10-31 DIAGNOSIS — H353133 Nonexudative age-related macular degeneration, bilateral, advanced atrophic without subfoveal involvement: Secondary | ICD-10-CM | POA: Diagnosis not present

## 2017-10-31 DIAGNOSIS — H353222 Exudative age-related macular degeneration, left eye, with inactive choroidal neovascularization: Secondary | ICD-10-CM | POA: Diagnosis not present

## 2017-10-31 DIAGNOSIS — H353212 Exudative age-related macular degeneration, right eye, with inactive choroidal neovascularization: Secondary | ICD-10-CM | POA: Diagnosis not present

## 2017-11-09 ENCOUNTER — Other Ambulatory Visit: Payer: Self-pay | Admitting: Family Medicine

## 2017-11-09 DIAGNOSIS — R300 Dysuria: Secondary | ICD-10-CM

## 2017-11-09 NOTE — Telephone Encounter (Signed)
Copied from [place] [id number]. Topic: Quick Communication - Rx Refill/Question >> Nov 09, 2017 10:41 AM Pricilla Handler wrote: Medication: Sulfamethoxazole-trimethoprim (BACTRIM DS,SEPTRA DS) 800-160 MG tablet Has the patient contacted their pharmacy? Yes.   (Agent: If no, request that the patient contact the pharmacy for the refill.) Preferred Pharmacy (with phone number or street name): [id number] [address], [place] [phone] (Phone)  [phone] (Fax)  Agent: Please be advised that RX refills may take up to 3 business days. We ask that you follow-up with your pharmacy.

## 2017-11-09 NOTE — Telephone Encounter (Signed)
Spoke with patient about her symptoms. Will place UA and urine culture.   Rosemarie Ax, MD [facility] 11/09/2017, 5:17 PM

## 2017-11-09 NOTE — Telephone Encounter (Signed)
Copied from [place] [id number]. Topic: Quick Communication - Rx Refill/Question >> Nov 09, 2017 10:41 AM Pricilla Handler wrote: Medication: Sulfamethoxazole-trimethoprim (BACTRIM DS,SEPTRA DS) 800-160 MG tablet Has the patient contacted their pharmacy? Yes.   (Agent: If no, request that the patient contact the pharmacy for the refill.) Preferred Pharmacy (with phone number or street name): [id number] [address], [place] [phone] (Phone)  [phone] (Fax)  Agent: Please be advised that RX refills may take up to 3 business days. We ask that you follow-up with your pharmacy.

## 2017-11-09 NOTE — Telephone Encounter (Signed)
Please advise. Pt saw you on 10/12/17

## 2017-11-13 ENCOUNTER — Other Ambulatory Visit (INDEPENDENT_AMBULATORY_CARE_PROVIDER_SITE_OTHER): Payer: Medicare Other

## 2017-11-13 DIAGNOSIS — R300 Dysuria: Secondary | ICD-10-CM

## 2017-11-13 LAB — URINALYSIS, ROUTINE W REFLEX MICROSCOPIC
Bilirubin Urine: NEGATIVE
Hgb urine dipstick: NEGATIVE
Ketones, ur: NEGATIVE
Nitrite: NEGATIVE
RBC / HPF: NONE SEEN (ref 0–?)
TOTAL PROTEIN, URINE-UPE24: NEGATIVE
Urine Glucose: NEGATIVE
Urobilinogen, UA: 0.2 (ref 0.0–1.0)
pH: 7.5 (ref 5.0–8.0)

## 2017-11-14 LAB — URINE CULTURE
MICRO NUMBER:: 90518839
Result:: NO GROWTH
SPECIMEN QUALITY:: ADEQUATE

## 2017-12-05 ENCOUNTER — Other Ambulatory Visit: Payer: Self-pay | Admitting: Internal Medicine

## 2017-12-25 ENCOUNTER — Other Ambulatory Visit: Payer: Self-pay | Admitting: Internal Medicine

## 2017-12-25 DIAGNOSIS — I10 Essential (primary) hypertension: Secondary | ICD-10-CM

## 2017-12-26 DIAGNOSIS — Z8551 Personal history of malignant neoplasm of bladder: Secondary | ICD-10-CM | POA: Diagnosis not present

## 2018-01-14 ENCOUNTER — Other Ambulatory Visit: Payer: Self-pay

## 2018-01-14 ENCOUNTER — Encounter (HOSPITAL_COMMUNITY): Payer: Self-pay | Admitting: Emergency Medicine

## 2018-01-14 ENCOUNTER — Emergency Department (HOSPITAL_COMMUNITY): Payer: Medicare Other

## 2018-01-14 ENCOUNTER — Inpatient Hospital Stay (HOSPITAL_COMMUNITY)
Admission: EM | Admit: 2018-01-14 | Discharge: 2018-01-17 | DRG: 481 | Disposition: A | Discharge status: Home Health | Payer: Medicare Other | Attending: Family Medicine | Admitting: Family Medicine

## 2018-01-14 DIAGNOSIS — H3530 Unspecified macular degeneration: Secondary | ICD-10-CM | POA: Diagnosis present

## 2018-01-14 DIAGNOSIS — W1830XA Fall on same level, unspecified, initial encounter: Secondary | ICD-10-CM | POA: Diagnosis present

## 2018-01-14 DIAGNOSIS — I4891 Unspecified atrial fibrillation: Secondary | ICD-10-CM | POA: Diagnosis not present

## 2018-01-14 DIAGNOSIS — Z7982 Long term (current) use of aspirin: Secondary | ICD-10-CM | POA: Diagnosis not present

## 2018-01-14 DIAGNOSIS — Y92009 Unspecified place in unspecified non-institutional (private) residence as the place of occurrence of the external cause: Secondary | ICD-10-CM

## 2018-01-14 DIAGNOSIS — S7291XA Unspecified fracture of right femur, initial encounter for closed fracture: Secondary | ICD-10-CM | POA: Diagnosis present

## 2018-01-14 DIAGNOSIS — Z79899 Other long term (current) drug therapy: Secondary | ICD-10-CM

## 2018-01-14 DIAGNOSIS — S72141A Displaced intertrochanteric fracture of right femur, initial encounter for closed fracture: Secondary | ICD-10-CM | POA: Diagnosis not present

## 2018-01-14 DIAGNOSIS — E559 Vitamin D deficiency, unspecified: Secondary | ICD-10-CM | POA: Diagnosis not present

## 2018-01-14 DIAGNOSIS — D62 Acute posthemorrhagic anemia: Secondary | ICD-10-CM | POA: Diagnosis not present

## 2018-01-14 DIAGNOSIS — I10 Essential (primary) hypertension: Secondary | ICD-10-CM | POA: Diagnosis not present

## 2018-01-14 DIAGNOSIS — W19XXXA Unspecified fall, initial encounter: Secondary | ICD-10-CM | POA: Diagnosis not present

## 2018-01-14 DIAGNOSIS — M8580 Other specified disorders of bone density and structure, unspecified site: Secondary | ICD-10-CM | POA: Diagnosis not present

## 2018-01-14 DIAGNOSIS — M79651 Pain in right thigh: Secondary | ICD-10-CM | POA: Diagnosis not present

## 2018-01-14 DIAGNOSIS — Z8551 Personal history of malignant neoplasm of bladder: Secondary | ICD-10-CM | POA: Diagnosis not present

## 2018-01-14 DIAGNOSIS — Z419 Encounter for procedure for purposes other than remedying health state, unspecified: Secondary | ICD-10-CM

## 2018-01-14 DIAGNOSIS — S299XXA Unspecified injury of thorax, initial encounter: Secondary | ICD-10-CM | POA: Diagnosis not present

## 2018-01-14 DIAGNOSIS — S50311A Abrasion of right elbow, initial encounter: Secondary | ICD-10-CM | POA: Diagnosis present

## 2018-01-14 DIAGNOSIS — S72009A Fracture of unspecified part of neck of unspecified femur, initial encounter for closed fracture: Secondary | ICD-10-CM | POA: Diagnosis present

## 2018-01-14 DIAGNOSIS — I480 Paroxysmal atrial fibrillation: Secondary | ICD-10-CM | POA: Diagnosis not present

## 2018-01-14 DIAGNOSIS — S728X1A Other fracture of right femur, initial encounter for closed fracture: Secondary | ICD-10-CM | POA: Diagnosis not present

## 2018-01-14 DIAGNOSIS — I959 Hypotension, unspecified: Secondary | ICD-10-CM | POA: Diagnosis not present

## 2018-01-14 DIAGNOSIS — R269 Unspecified abnormalities of gait and mobility: Secondary | ICD-10-CM | POA: Diagnosis not present

## 2018-01-14 HISTORY — DX: Anemia, unspecified: D64.9

## 2018-01-14 HISTORY — DX: Unspecified macular degeneration: H35.30

## 2018-01-14 HISTORY — DX: Personal history of other medical treatment: Z92.89

## 2018-01-14 LAB — CBC WITH DIFFERENTIAL/PLATELET
BASOS ABS: 0 10*3/uL (ref 0.0–0.1)
BASOS PCT: 0 %
EOS ABS: 0.1 10*3/uL (ref 0.0–0.7)
Eosinophils Relative: 1 %
HEMATOCRIT: 38 % (ref 36.0–46.0)
Hemoglobin: 12.5 g/dL (ref 12.0–15.0)
Lymphocytes Relative: 10 %
Lymphs Abs: 0.9 10*3/uL (ref 0.7–4.0)
MCH: 32.2 pg (ref 26.0–34.0)
MCHC: 32.9 g/dL (ref 30.0–36.0)
MCV: 97.9 fL (ref 78.0–100.0)
MONOS PCT: 7 %
Monocytes Absolute: 0.7 10*3/uL (ref 0.1–1.0)
NEUTROS ABS: 7.5 10*3/uL (ref 1.7–7.7)
Neutrophils Relative %: 82 %
Platelets: 285 10*3/uL (ref 150–400)
RBC: 3.88 MIL/uL (ref 3.87–5.11)
RDW: 12.8 % (ref 11.5–15.5)
WBC: 9.1 10*3/uL (ref 4.0–10.5)

## 2018-01-14 LAB — BASIC METABOLIC PANEL
Anion gap: 8 (ref 5–15)
BUN: 19 mg/dL (ref 8–23)
CALCIUM: 9.6 mg/dL (ref 8.9–10.3)
CO2: 28 mmol/L (ref 22–32)
CREATININE: 0.72 mg/dL (ref 0.44–1.00)
Chloride: 102 mmol/L (ref 98–111)
GFR calc Af Amer: >60 mL/min (ref >60)
Glucose, Bld: 142 mg/dL — ABNORMAL HIGH (ref 70–99)
Potassium: 4.2 mmol/L (ref 3.5–5.1)
Sodium: 138 mmol/L (ref 135–145)

## 2018-01-14 LAB — URINALYSIS, ROUTINE W REFLEX MICROSCOPIC
Bilirubin Urine: NEGATIVE
Glucose, UA: NEGATIVE mg/dL
HGB URINE DIPSTICK: NEGATIVE
Ketones, ur: NEGATIVE mg/dL
Leukocytes, UA: NEGATIVE
Nitrite: NEGATIVE
PH: 8 (ref 5.0–8.0)
Protein, ur: NEGATIVE mg/dL
SPECIFIC GRAVITY, URINE: 1.008 (ref 1.005–1.030)

## 2018-01-14 LAB — PROTIME-INR
INR: 0.96
Prothrombin Time: 12.7 seconds (ref 11.4–15.2)

## 2018-01-14 LAB — TYPE AND SCREEN
ABO/RH(D): A POS
Antibody Screen: NEGATIVE

## 2018-01-14 LAB — ABO/RH: ABO/RH(D): A POS

## 2018-01-14 LAB — SURGICAL PCR SCREEN
MRSA, PCR: NEGATIVE
Staphylococcus aureus: POSITIVE — AB

## 2018-01-14 MED ORDER — AMLODIPINE BESYLATE 5 MG PO TABS
5.0000 mg | ORAL_TABLET | Freq: Every day | ORAL | Status: DC
  Administered 2018-01-16 – 2018-01-17 (×2): 5 mg via ORAL
  Filled 2018-01-14 (×2): qty 1

## 2018-01-14 MED ORDER — METOPROLOL TARTRATE 25 MG PO TABS
25.0000 mg | ORAL_TABLET | Freq: Two times a day (BID) | ORAL | Status: DC
  Administered 2018-01-14 – 2018-01-17 (×5): 25 mg via ORAL
  Filled 2018-01-14 (×5): qty 1

## 2018-01-14 MED ORDER — ACETAMINOPHEN 325 MG PO TABS
650.0000 mg | ORAL_TABLET | Freq: Four times a day (QID) | ORAL | Status: DC | PRN
  Administered 2018-01-14 (×2): 650 mg via ORAL
  Filled 2018-01-14 (×2): qty 2

## 2018-01-14 MED ORDER — ENOXAPARIN SODIUM 40 MG/0.4ML ~~LOC~~ SOLN
40.0000 mg | SUBCUTANEOUS | Status: DC
  Administered 2018-01-14: 40 mg via SUBCUTANEOUS
  Filled 2018-01-14: qty 0.4

## 2018-01-14 MED ORDER — ONDANSETRON HCL 4 MG/2ML IJ SOLN
4.0000 mg | Freq: Four times a day (QID) | INTRAMUSCULAR | Status: DC | PRN
  Administered 2018-01-15: 4 mg via INTRAVENOUS

## 2018-01-14 MED ORDER — FENTANYL CITRATE (PF) 100 MCG/2ML IJ SOLN
50.0000 ug | INTRAMUSCULAR | Status: DC | PRN
  Administered 2018-01-14: 50 ug via INTRAVENOUS
  Filled 2018-01-14: qty 2

## 2018-01-14 MED ORDER — MORPHINE SULFATE (PF) 2 MG/ML IV SOLN
2.0000 mg | INTRAVENOUS | Status: DC | PRN
  Administered 2018-01-14 – 2018-01-15 (×2): 2 mg via INTRAVENOUS
  Filled 2018-01-14 (×2): qty 1

## 2018-01-14 MED ORDER — ACETAMINOPHEN 650 MG RE SUPP: 650.0000 mg | Freq: Four times a day (QID) | RECTAL | Status: DC | PRN

## 2018-01-14 MED ORDER — SODIUM CHLORIDE 0.9 % IV SOLN
INTRAVENOUS | Status: DC
  Administered 2018-01-14: 11:00:00 via INTRAVENOUS

## 2018-01-14 MED ORDER — ONDANSETRON HCL 4 MG/2ML IJ SOLN
4.0000 mg | Freq: Once | INTRAMUSCULAR | Status: AC
  Administered 2018-01-14: 4 mg via INTRAVENOUS
  Filled 2018-01-14: qty 2

## 2018-01-14 MED ORDER — ONDANSETRON HCL 4 MG PO TABS: 4.0000 mg | ORAL_TABLET | Freq: Four times a day (QID) | ORAL | Status: DC | PRN

## 2018-01-14 NOTE — ED Notes (Signed)
Purewick placed for patient comfort.

## 2018-01-14 NOTE — ED Triage Notes (Signed)
Pt from home by [facility]. Pt was wearing heels and fell onto concrete. NO head neck or back pain. No LOC, no blood thinners. Pulse motor sensation in right foot, no bruising or redness. Pain only when she moves to right thigh. AO x4.

## 2018-01-14 NOTE — ED Notes (Signed)
Pt. returned from XR. 

## 2018-01-14 NOTE — ED Notes (Signed)
Bed: QD82 Expected date: 01/14/18 Expected time: 10:41 AM Means of arrival: Ambulance Comments: Fall

## 2018-01-14 NOTE — ED Provider Notes (Signed)
[facility] DEPT Provider Note   CSN: [id number] Arrival date & time: 01/14/18  1050     History   Chief Complaint Chief Complaint  Patient presents with  . Fall  . Leg Pain    HPI [name] is a 84 y.o. female.  Pt presents to the ED today with right sided hip pain.  The pt was leaving her house to go to church when her heel got caught in the mat.  The pt fell on her right side.  She denies any other pain.  She is not on blood thinners.  She did not hit her head or have loc.     Past Medical History:  Diagnosis Date  . Arthritis   . Atrial fibrillation ([place])   . Cancer [facility])    low grade papillary, most recent recurrence 2011,  Dr Karsten Ro  . 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 Hypertension   . Hyponatremia 01/26/12   Na+ 131  . Other abnormal glucose 06/14/12   Fasting blood glucose 128; A1c 5.8% on 12/15/11    Patient Active Problem List   Diagnosis Date Noted  . Femur fracture, right ([place]) 01/14/2018  . Dysuria 10/12/2017  . Macular degeneration (senile) of retina 06/14/2010  . Vitamin D deficiency 03/10/2009  . Osteoporosis 03/10/2009  . NEOPLASM, MALIGNANT, BLADDER, HX OF 03/10/2009  . 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  . Hyperglycemia 06/26/2008  . Essential hypertension 08/09/2007  . ATRIAL FIBRILLATION WITH RAPID VENTRICULAR RESPONSE 08/09/2007    Past Surgical History:  Procedure Laterality Date  . 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 APPENDECTOMY     1970s  . BLADDER TUMOR EXCISION  2011 & 2013   Dr Karsten Ro  . CATARACT EXTRACTION, BILATERAL  2014   Dr Herbert Deaner  . COLONOSCOPY     negative; Dr Olevia Perches  . CYSTOSCOPY      multiple since tumor excision;Dr Ottelin     OB History   None      Home Medications    Prior to Admission medications   Medication Sig Start Date End Date Taking? Authorizing Provider  amLODipine (NORVASC) 5  MG tablet Take 1 tablet (5 mg total) by mouth daily. -- Office visit needed for further refills 12/05/17  Yes Burns, Claudina Lick, MD  aspirin 81 MG tablet Take 81 mg by mouth daily.   Yes [provider]  Bilberry, Vaccinium myrtillus, (BILBERRY PO) Take by mouth daily.   Yes [provider]  Calcium Carbonate 500 MG CHEW Chew 1,000 mg by mouth daily.   Yes [provider]  magnesium gluconate (MAGONATE) 500 MG tablet Take 500 mg by mouth daily. Reported on 12/16/2015   Yes [provider]  metoprolol tartrate (LOPRESSOR) 25 MG tablet Take 1 tablet (25 mg total) by mouth 2 (two) times daily. -- Office visit needed for further refills 12/25/17  Yes Burns, Claudina Lick, MD  Misc Natural Products (LUTEIN 20 PO) Take 20 mg by mouth daily.   Yes [provider]  Multiple Vitamins-Minerals (VISION-VITE PRESERVE PO) Take 1 tablet by mouth daily.   Yes [provider]  multivitamin-lutein (OCUVITE-LUTEIN) CAPS capsule Take 1 capsule by mouth daily.   Yes [provider]  Omega-3 Fatty Acids (FISH OIL) 1000 MG CAPS Take 1 capsule by mouth daily.   Yes [provider]  tretinoin (RETIN-A) 0.05 % cream Apply 1 application topically  at bedtime.   Yes [provider]  sulfamethoxazole-trimethoprim (BACTRIM DS,SEPTRA DS) 800-160 MG tablet Take 1 tablet by mouth 2 (two) times daily. Patient not taking: Reported on 01/14/2018 10/12/17   Rosemarie Ax, MD    Family History Family History  Problem Relation Age of Onset  . Stroke Mother 53  . Heart attack Father 89  . Coronary artery disease Brother        S/P CBAG ? @ 60  . Diabetes Brother   . Kidney disease Neg Hx   . Cancer Neg Hx   . Colon cancer Neg Hx   . Colon polyps Neg Hx     Social History Social History   Tobacco Use  . Smoking status: Never Smoker  . Smokeless tobacco: Never Used  Substance Use Topics  . Alcohol use: No    Alcohol/week: 0.0 oz  . Drug use: No      Allergies   Patient has no known allergies.   Review of Systems Review of Systems  Musculoskeletal:       Right hip pain  All other systems reviewed and are negative.    Physical Exam Updated Vital Signs BP (!) 144/67   Pulse 67   Temp 97.6 F (36.4 C) (Oral)   Resp (!) 22   Ht 5\' 4"  (1.626 m)   Wt 45.4 kg (100 lb)   SpO2 100%   BMI 17.16 kg/m   Physical Exam  Constitutional: She is oriented to person, place, and time. She appears well-developed and well-nourished.  HENT:  Head: Normocephalic and atraumatic.  Right Ear: External ear normal.  Left Ear: External ear normal.  Nose: Nose normal.  Mouth/Throat: Oropharynx is clear and moist.  Eyes: Pupils are equal, round, and reactive to light. Conjunctivae and EOM are normal.  Neck: Normal range of motion. Neck supple.  Cardiovascular: Normal rate, regular rhythm, normal heart sounds and intact distal pulses.  Pulmonary/Chest: Effort normal and breath sounds normal.  Abdominal: Soft. Bowel sounds are normal.  Musculoskeletal:       Right hip: She exhibits decreased range of motion and tenderness.  Neurological: She is alert and oriented to person, place, and time.  Skin: Skin is warm. Capillary refill takes less than 2 seconds.  Psychiatric: She has a normal mood and affect. Her behavior is normal. Judgment and thought content normal.  Nursing note and vitals reviewed.    ED Treatments / Results  Labs (all labs ordered are listed, but only abnormal results are displayed) Labs Reviewed  BASIC METABOLIC PANEL - Abnormal; Notable for the following components:      Result Value   Glucose, Bld 142 (*)    All other components within normal limits  CBC WITH DIFFERENTIAL/PLATELET  PROTIME-INR  URINALYSIS, ROUTINE W REFLEX MICROSCOPIC  TYPE AND SCREEN  ABO/RH    EKG EKG Interpretation  Date/Time:  Sunday January 14 2018 11:07:09 EDT Ventricular Rate:  57 PR Interval:    QRS Duration: 95 QT  Interval:  436 QTC Calculation: 425 R Axis:   -38 Text Interpretation:  Sinus rhythm Ventricular premature complex LVH with secondary repolarization abnormality Anterior infarct, old No significant change since last tracing Confirmed by Isla Pence [phone]) on 01/14/2018 11:31:21 AM   Radiology Dg Chest 2 View  Result Date: 01/14/2018 CLINICAL DATA:  Fall. EXAM: CHEST - 2 VIEW COMPARISON:  October 06, 2008 FINDINGS: No pneumothorax. The heart, hila, mediastinum, lungs, and pleura are normal. No other acute abnormalities. IMPRESSION: No acute  abnormalities. Electronically Signed   By: Dorise Bullion III M.D   On: 01/14/2018 13:02   Dg Pelvis 1-2 Views  Result Date: 01/14/2018 CLINICAL DATA:  Pt was wearing heels and fell onto concrete. NO head neck or back pain. No LOC, no blood thinners. Pulse motor sensation in right foot, no bruising or redness. Pain only when she moves to right thigh. Pt HX: non smoker, HTN EXAM: PELVIS - 1-2 VIEW COMPARISON:  CT 09/26/2008 FINDINGS: Mildly comminuted intratrochanteric fracture of the right femur, with fragments distracted at least 1.4 cm. No dislocation. Bony pelvis appears intact. Left hip unremarkable. Bilateral pelvic phleboliths. Degenerative disc disease in the lower lumbar spine. IMPRESSION: 1. Distracted right intertrochanteric femur fracture. Electronically Signed   By: Lucrezia Europe M.D.   On: 01/14/2018 13:05   Dg Femur, Min 2 Views Right  Result Date: 01/14/2018 CLINICAL DATA:  Fall onto concrete. EXAM: RIGHT FEMUR 2 VIEWS COMPARISON:  None. FINDINGS: Acute intertrochanteric right femur fracture with mild varus angulation. The hip is located. Osteopenia. IMPRESSION: Acute intertrochanteric right femur fracture. Electronically Signed   By: Monte Fantasia M.D.   On: 01/14/2018 13:03    Procedures Procedures (including critical care time)  Medications Ordered in ED Medications  0.9 %  sodium chloride infusion ( Intravenous New Bag/Given 01/14/18  1124)  fentaNYL (SUBLIMAZE) injection 50 mcg (50 mcg Intravenous Given 01/14/18 1314)  ondansetron (ZOFRAN) injection 4 mg (4 mg Intravenous Given 01/14/18 1314)     Initial Impression / Assessment and Plan / ED Course  I have reviewed the triage vital signs and the nursing notes.  Pertinent labs & imaging results that were available during my care of the patient were reviewed by me and considered in my medical decision making (see chart for details).    She is a patient of Dr. Mayer Camel ([place]) and requests that group.  Pt d/w PA on call for [facility].  He spoke with Dr. Latanya Maudlin who can operate on her at 7:30 tomorrow at [facility].  They request medicine admission.  Pt d/w Dr. Quincy Simmonds (triad) who will [facility] to [facility].    Pt's family updated and questions answered.  Final Clinical Impressions(s) / ED Diagnoses   Final diagnoses:  Fall, initial encounter  Closed displaced intertrochanteric fracture of right femur, initial encounter [facility])    ED Discharge Orders    None       Isla Pence, MD 01/14/18 1346

## 2018-01-14 NOTE — H&P (View-Only) (Signed)
Janet Nakayama, MD  Chauncey Cruel, PA-C  Loni Dolly, PA-C                                  [facility]                [address] [place], [place]  [address]   ORTHOPAEDIC CONSULTATION  NATARSHA HURWITZ            MRN:  [id number] DOB/SEX:  1930/02/26/female     CHIEF COMPLAINT:  Painful right hip  HISTORY: MALEAH Moreno a 84 y.o. female with right hip fracture.  Lives independently in her own home and caught foot on doormat today falling onto right side.  Injured right elbow and hip.  No LOC or antecedant CP. Retired from work at [employer].  Active at [facility] [employer] [facility] and in her garden.  [facility]   PAST MEDICAL HISTORY: Patient Active Problem List   Diagnosis Date Noted  . Femur fracture, right ([place]) 01/14/2018  . Hip fracture ([place]) 01/14/2018  . Dysuria 10/12/2017  . Macular degeneration (senile) of retina 06/14/2010  . Vitamin D deficiency 03/10/2009  . Osteoporosis 03/10/2009  . NEOPLASM, MALIGNANT, BLADDER, HX OF 03/10/2009  . 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  . Hyperglycemia 06/26/2008  . Essential hypertension 08/09/2007  . ATRIAL FIBRILLATION WITH RAPID VENTRICULAR RESPONSE 08/09/2007   Past Medical History:  Diagnosis Date  . Arthritis   . Atrial fibrillation ([place])   . Cancer [facility])    low grade papillary, most recent recurrence 2011,  Dr Karsten Ro  . 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 Hypertension   . Hyponatremia 01/26/12   Na+ 131  . Other abnormal glucose 06/14/12   Fasting blood glucose 128; A1c 5.8% on 12/15/11   Past Surgical History:  Procedure Laterality Date  . 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 APPENDECTOMY     1970s  . BLADDER TUMOR EXCISION  2011 & 2013   Dr Karsten Ro  . CATARACT EXTRACTION, BILATERAL  2014   Dr Herbert Deaner  . COLONOSCOPY     negative; Dr Olevia Perches  . CYSTOSCOPY      multiple since tumor excision;Dr Ottelin     MEDICATIONS:   Current  Facility-Administered Medications:  .  acetaminophen (TYLENOL) tablet 650 mg, 650 mg, Oral, Q6H PRN, 650 mg at 01/14/18 1800 **OR** acetaminophen (TYLENOL) suppository 650 mg, 650 mg, Rectal, Q6H PRN, Patrecia Pour, Christean Grief, MD .  Derrill Memo ON 01/15/2018] amLODipine (NORVASC) tablet 5 mg, 5 mg, Oral, Daily, Patrecia Pour, Edwin, MD .  enoxaparin (LOVENOX) injection 40 mg, 40 mg, Subcutaneous, Q24H, Patrecia Pour, Christean Grief, MD, 40 mg at 01/14/18 2057 .  metoprolol tartrate (LOPRESSOR) tablet 25 mg, 25 mg, Oral, BID, Patrecia Pour, Christean Grief, MD, 25 mg at 01/14/18 2058 .  morphine 2 MG/ML injection 2 mg, 2 mg, Intravenous, Q4H PRN, Patrecia Pour, Edwin, MD .  ondansetron [facility]) tablet 4 mg, 4 mg, Oral, Q6H PRN **OR** ondansetron (ZOFRAN) injection 4 mg, 4 mg, Intravenous, Q6H PRN, Patrecia Pour, Christean Grief, MD  ALLERGIES:  No Known Allergies  REVIEW OF SYSTEMS: REVIEWED IN DETAIL IN CHART  FAMILY HISTORY:   Family History  Problem Relation Age of Onset  . Stroke Mother 11  . Heart attack Father 63  . Coronary artery disease Brother        S/P CBAG ? @  12  . Diabetes Brother   . Kidney disease Neg Hx   . Cancer Neg Hx   . Colon cancer Neg Hx   . Colon polyps Neg Hx     SOCIAL HISTORY:   Social History   Tobacco Use  . Smoking status: Never Smoker  . Smokeless tobacco: Never Used  Substance Use Topics  . Alcohol use: No    Alcohol/week: 0.0 oz     EXAMINATION: Vital signs in last 24 hours: Temp:  [97.6 F (36.4 C)-98.2 F (36.8 C)] 98.2 F (36.8 C) (06/30 2000) Pulse Rate:  [58-78] 78 (06/30 2000) Resp:  [14-22] 14 (06/30 2000) BP: (123-162)/(48-145) 123/48 (06/30 2000) SpO2:  [96 %-100 %] 100 % (06/30 1530) Weight:  [45.4 kg (100 lb)] 45.4 kg (100 lb) (06/30 1110)  BP (!) 123/48 (BP Location: Left Arm)   Pulse 78   Temp 98.2 F (36.8 C) (Oral)   Resp 14   Ht 5\' 4"  (1.626 m)   Wt 45.4 kg (100 lb)   SpO2 100%   BMI 17.16 kg/m   General Appearance:    Alert, cooperative, no distress,  appears stated age  Head:    Normocephalic, without obvious abnormality, atraumatic  Eyes:    PERRL, conjunctiva/corneas clear, EOM's intact, fundi    benign, both eyes  Ears:    Normal TM's and external ear canals, both ears  Nose:   Nares normal, septum midline, mucosa normal, no drainage    or sinus tenderness  Throat:   Lips, mucosa, and tongue normal; teeth and gums normal  Neck:   Supple, symmetrical, trachea midline, no adenopathy;    thyroid:  no enlargement/tenderness/nodules; no carotid   bruit or JVD  Back:     Symmetric, no curvature, ROM normal, no CVA tenderness  Lungs:     Clear to auscultation bilaterally, respirations unlabored  Chest Wall:    No tenderness or deformity   Heart:    Irregular rate and rhythm, S1 and S2 normal, no murmur, rub   or gallop  Breast Exam:    No tenderness, masses, or nipple abnormality  Abdomen:     Soft, non-tender, bowel sounds active all four quadrants,    no masses, no organomegaly  Genitalia:    Rectal:    Extremities:   Extremities normal, atraumatic, no cyanosis or edema  Pulses:   2+ and symmetric all extremities  Skin:   Skin color, texture, turgor normal, no rashes or lesions  Lymph nodes:   Cervical, supraclavicular, and axillary nodes normal  Neurologic:   CNII-XII intact, normal strength, sensation and reflexes    throughout    Musculoskeletal Exam:   Right leg SER and painful. Right elbow abrasion   DIAGNOSTIC STUDIES: Recent laboratory studies: Recent Labs    01/14/18 1123  WBC 9.1  HGB 12.5  HCT 38.0  PLT 285   Recent Labs    01/14/18 1123  NA 138  K 4.2  CL 102  CO2 28  BUN 19  CREATININE 0.72  GLUCOSE 142*  CALCIUM 9.6   Lab Results  Component Value Date   INR 0.96 01/14/2018     Recent Radiographic Studies :  Dg Chest 2 View  Result Date: 01/14/2018 CLINICAL DATA:  Fall. EXAM: CHEST - 2 VIEW COMPARISON:  October 06, 2008 FINDINGS: No pneumothorax. The heart, hila, mediastinum, lungs, and pleura  are normal. No other acute abnormalities. IMPRESSION: No acute abnormalities. Electronically Signed   By: Dorise Bullion III  M.D   On: 01/14/2018 13:02   Dg Pelvis 1-2 Views  Result Date: 01/14/2018 CLINICAL DATA:  Pt was wearing heels and fell onto concrete. NO head neck or back pain. No LOC, no blood thinners. Pulse motor sensation in right foot, no bruising or redness. Pain only when she moves to right thigh. Pt HX: non smoker, HTN EXAM: PELVIS - 1-2 VIEW COMPARISON:  CT 09/26/2008 FINDINGS: Mildly comminuted intratrochanteric fracture of the right femur, with fragments distracted at least 1.4 cm. No dislocation. Bony pelvis appears intact. Left hip unremarkable. Bilateral pelvic phleboliths. Degenerative disc disease in the lower lumbar spine. IMPRESSION: 1. Distracted right intertrochanteric femur fracture. Electronically Signed   By: Lucrezia Europe M.D.   On: 01/14/2018 13:05   Dg Femur, Min 2 Views Right  Result Date: 01/14/2018 CLINICAL DATA:  Fall onto concrete. EXAM: RIGHT FEMUR 2 VIEWS COMPARISON:  None. FINDINGS: Acute intertrochanteric right femur fracture with mild varus angulation. The hip is located. Osteopenia. IMPRESSION: Acute intertrochanteric right femur fracture. Electronically Signed   By: Monte Fantasia M.D.   On: 01/14/2018 13:03    ASSESSMENT:  Right hip intertrochanteric fracture   PLAN:  ORIF in AM with trochanteric nail.  Reviewed risks of anesthesia, infection, nonunion.  Will need to be PWB for awhile most likely and may need SNF placement or help at home.  Son is a PA and also has daughter nearby.  Donnah Levert G 01/14/2018, 9:02 PM

## 2018-01-14 NOTE — H&P (Signed)
History and Physical    [name] WEX:[id number] DOB: May 08, 1930  DOA: 01/14/2018 PCP: Binnie Rail, MD  Patient coming from: Home   Chief Complaint: Fall   HPI: [name] is a 84 y.o. female with medical history significant of hypertension, A. fib not on anticoagulation presented to the emergency department after mechanical fall.  Patient was leaving her house to go to chair and her heel got caught up in her doormat.  Patient fell on her right side.  She denies hitting her head, losing consciousness or having weakness.  Patient subsequently developed pain on her right hip and was unable to move her right lower extremity.  ED Course: Lab work-up unremarkable, Xray shows acute intra-trochanteric right femoral fracture. EDP consulted orthopedic surgery and recommended to admit to [facility] for surgery in AM.   Review of Systems:   All systems reviewed and negative except the ones mentioned on HPI   Past Medical History:  Diagnosis Date  . Arthritis   . Atrial fibrillation ([place])   . Cancer [facility])    low grade papillary, most recent recurrence 2011,  Dr Karsten Ro  . 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 Hypertension   . Hyponatremia 01/26/12   Na+ 131  . Other abnormal glucose 06/14/12   Fasting blood glucose 128; A1c 5.8% on 12/15/11    Past Surgical History:  Procedure Laterality Date  . 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 APPENDECTOMY     1970s  . BLADDER TUMOR EXCISION  2011 & 2013   Dr Karsten Ro  . CATARACT EXTRACTION, BILATERAL  2014   Dr Herbert Deaner  . COLONOSCOPY     negative; Dr Olevia Perches  . CYSTOSCOPY      multiple since tumor excision;Dr Ottelin     reports that she has never smoked. She has never used smokeless tobacco. She reports that she does not drink alcohol or use drugs.  No Known Allergies  Family History  Problem Relation Age of Onset  . Stroke Mother 42  . Heart  attack Father 7  . Coronary artery disease Brother        S/P CBAG ? @ 60  . Diabetes Brother   . Kidney disease Neg Hx   . Cancer Neg Hx   . Colon cancer Neg Hx   . Colon polyps Neg Hx    Family history reviewed and not pertinent   Prior to Admission medications   Medication Sig Start Date End Date Taking? Authorizing Provider  amLODipine (NORVASC) 5 MG tablet Take 1 tablet (5 mg total) by mouth daily. -- Office visit needed for further refills 12/05/17  Yes Burns, Claudina Lick, MD  aspirin 81 MG tablet Take 81 mg by mouth daily.   Yes [provider]  Bilberry, Vaccinium myrtillus, (BILBERRY PO) Take by mouth daily.   Yes [provider]  Calcium Carbonate 500 MG CHEW Chew 1,000 mg by mouth daily.   Yes [provider]  magnesium gluconate (MAGONATE) 500 MG tablet Take 500 mg by mouth daily. Reported on 12/16/2015   Yes [provider]  metoprolol tartrate (LOPRESSOR) 25 MG tablet Take 1 tablet (25 mg total) by mouth 2 (two) times daily. -- Office visit needed for further refills 12/25/17  Yes Burns, Claudina Lick, MD  Misc Natural Products (LUTEIN 20 PO) Take 20 mg by mouth daily.   Yes [provider]  Multiple Vitamins-Minerals (VISION-VITE PRESERVE PO) Take 1 tablet by mouth daily.   Yes [provider]  multivitamin-lutein (OCUVITE-LUTEIN) CAPS capsule Take 1 capsule by mouth daily.   Yes [provider]  Omega-3 Fatty Acids (FISH OIL) 1000 MG CAPS Take 1 capsule by mouth daily.   Yes [provider]  tretinoin (RETIN-A) 0.05 % cream Apply 1 application topically at bedtime.   Yes [provider]    Physical Exam: Vitals:   01/14/18 1315 01/14/18 1330 01/14/18 1345 01/14/18 1400  BP: (!) 148/63 (!) 144/67 (!) 162/145 (!) 130/56  Pulse: (!) 59 67 71 63  Resp: 18 (!) 22 (!) 21 18  Temp:      TempSrc:      SpO2: 100% 100% 100% 99%  Weight:      Height:         Constitutional: NAD Eyes: PERRL, lids and  conjunctivae normal ENMT: Mucous membranes are moist. Posterior pharynx clear of any exudate or lesions. Neck: normal, supple, no masses, no thyromegaly Respiratory: clear to auscultation bilaterally, no wheezing, no crackles. Normal respiratory effort. Cardiovascular: RRR, no murmurs. No extremity edema. 2+ pedal pulses  Abdomen: no tenderness, no masses palpated. No hepatosplenomegaly. Bowel sounds positive.  Musculoskeletal: Internally rotated, pain with ROM  Skin: no rashes, lesions, ulcers. No induration Neurologic: CN 2-12 grossly intact.  Psychiatric: Alert and oriented x 3. Normal mood.   Labs on Admission: I have personally reviewed following labs and imaging studies  CBC: Recent Labs  Lab 01/14/18 1123  WBC 9.1  NEUTROABS 7.5  HGB 12.5  HCT 38.0  MCV 97.9  PLT 798   Basic Metabolic Panel: Recent Labs  Lab 01/14/18 1123  NA 138  K 4.2  CL 102  CO2 28  GLUCOSE 142*  BUN 19  CREATININE 0.72  CALCIUM 9.6   GFR: Estimated Creatinine Clearance: 36.2 mL/min (by C-G formula based on SCr of 0.72 mg/dL). Liver Function Tests: No results for input(s): AST, ALT, ALKPHOS, BILITOT, PROT, ALBUMIN in the last 168 hours. No results for input(s): LIPASE, AMYLASE in the last 168 hours. No results for input(s): AMMONIA in the last 168 hours. Coagulation Profile: Recent Labs  Lab 01/14/18 1123  INR 0.96   Cardiac Enzymes: No results for input(s): CKTOTAL, CKMB, CKMBINDEX, TROPONINI in the last 168 hours. BNP (last 3 results) No results for input(s): PROBNP in the last 8760 hours. HbA1C: No results for input(s): HGBA1C in the last 72 hours. CBG: No results for input(s): GLUCAP in the last 168 hours. Lipid Profile: No results for input(s): CHOL, HDL, LDLCALC, TRIG, CHOLHDL, LDLDIRECT in the last 72 hours. Thyroid Function Tests: No results for input(s): TSH, T4TOTAL, FREET4, T3FREE, THYROIDAB in the last 72 hours. Anemia Panel: No results for input(s): VITAMINB12,  FOLATE, FERRITIN, TIBC, IRON, RETICCTPCT in the last 72 hours. Urine analysis:    Component Value Date/Time   COLORURINE YELLOW 11/13/2017 1102   APPEARANCEUR Sl Cloudy (A) 11/13/2017 1102   LABSPEC <=1.005 (A) 11/13/2017 1102   PHURINE 7.5 11/13/2017 1102   GLUCOSEU NEGATIVE 11/13/2017 1102   HGBUR NEGATIVE 11/13/2017 1102   HGBUR negative 09/10/2008 0752   BILIRUBINUR NEGATIVE 11/13/2017 1102   KETONESUR NEGATIVE 11/13/2017 1102   UROBILINOGEN 0.2 11/13/2017 1102   NITRITE NEGATIVE 11/13/2017 1102   LEUKOCYTESUR SMALL (A) 11/13/2017 1102   Sepsis Labs: !!!!!!!!!!!!!!!!!!!!!!!!!!!!!!!!!!!!!!!!!!!! @LABRCNTIP (procalcitonin:4,lacticidven:4) )No results found for this or any previous visit (from the past 240 hour(s)).   Radiological Exams on Admission: Dg Chest 2  View  Result Date: 01/14/2018 CLINICAL DATA:  Fall. EXAM: CHEST - 2 VIEW COMPARISON:  October 06, 2008 FINDINGS: No pneumothorax. The heart, hila, mediastinum, lungs, and pleura are normal. No other acute abnormalities. IMPRESSION: No acute abnormalities. Electronically Signed   By: Dorise Bullion III M.D   On: 01/14/2018 13:02   Dg Pelvis 1-2 Views  Result Date: 01/14/2018 CLINICAL DATA:  Pt was wearing heels and fell onto concrete. NO head neck or back pain. No LOC, no blood thinners. Pulse motor sensation in right foot, no bruising or redness. Pain only when she moves to right thigh. Pt HX: non smoker, HTN EXAM: PELVIS - 1-2 VIEW COMPARISON:  CT 09/26/2008 FINDINGS: Mildly comminuted intratrochanteric fracture of the right femur, with fragments distracted at least 1.4 cm. No dislocation. Bony pelvis appears intact. Left hip unremarkable. Bilateral pelvic phleboliths. Degenerative disc disease in the lower lumbar spine. IMPRESSION: 1. Distracted right intertrochanteric femur fracture. Electronically Signed   By: Lucrezia Europe M.D.   On: 01/14/2018 13:05   Dg Femur, Min 2 Views Right  Result Date: 01/14/2018 CLINICAL DATA:  Fall  onto concrete. EXAM: RIGHT FEMUR 2 VIEWS COMPARISON:  None. FINDINGS: Acute intertrochanteric right femur fracture with mild varus angulation. The hip is located. Osteopenia. IMPRESSION: Acute intertrochanteric right femur fracture. Electronically Signed   By: Monte Fantasia M.D.   On: 01/14/2018 13:03    EKG: Independently reviewed.  Assessment/Plan: Right intertrochanteric femoral fracture Admit to MedSurg, pain management.  Orthopedic surgery consulted, planning for ORIF in a.m.  Hypertension BP stable, continue metoprolol and Norvasc  A. fib Rate well controlled, patient not on anticoagulation at home.  DVT prophylaxis: Lovenox Code Status: Full code Family Communication: Daughter at bedside Disposition Plan: Anticipate discharge to previous home environment.  Consults called: EDP consulted given for orthopedics Admission status: Inpatient/MedSurg at [facility] MD [facility] Pager: Text Page via [email or web address]  [phone]  If 7PM-7AM, please contact night-coverage [email or web address] Password [email or web address]  01/14/2018, 2:08 PM

## 2018-01-14 NOTE — ED Notes (Addendum)
[facility] called to transport pt to [facility]. [facility] reports pt is 5th in line

## 2018-01-14 NOTE — Progress Notes (Signed)
Patient arrives via [facility] from [facility] at this time.

## 2018-01-14 NOTE — Consult Note (Signed)
Melrose Nakayama, MD  Chauncey Cruel, PA-C  Loni Dolly, PA-C                                  [facility]                [address], [place], [place]  [address]   ORTHOPAEDIC CONSULTATION  [name]            MRN:  [id number] DOB/SEX:  Apr 29, 1930/female     CHIEF COMPLAINT:  Painful right hip  HISTORY: [name] a 84 y.o. female with right hip fracture.  Lives independently in her own home and caught foot on doormat today falling onto right side.  Injured right elbow and hip.  No LOC or antecedant CP. Retired from work at [employer].  Active at [facility] and in her garden.  [facility]   PAST MEDICAL HISTORY: Patient Active Problem List   Diagnosis Date Noted  . Femur fracture, right ([place]) 01/14/2018  . Hip fracture ([place]) 01/14/2018  . Dysuria 10/12/2017  . Macular degeneration (senile) of retina 06/14/2010  . Vitamin D deficiency 03/10/2009  . Osteoporosis 03/10/2009  . NEOPLASM, MALIGNANT, BLADDER, HX OF 03/10/2009  . 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  . Hyperglycemia 06/26/2008  . Essential hypertension 08/09/2007  . ATRIAL FIBRILLATION WITH RAPID VENTRICULAR RESPONSE 08/09/2007   Past Medical History:  Diagnosis Date  . Arthritis   . Atrial fibrillation ([place])   . Cancer [facility])    low grade papillary, most recent recurrence 2011,  Dr Karsten Ro  . 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 Hypertension   . Hyponatremia 01/26/12   Na+ 131  . Other abnormal glucose 06/14/12   Fasting blood glucose 128; A1c 5.8% on 12/15/11   Past Surgical History:  Procedure Laterality Date  . 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 APPENDECTOMY     1970s  . BLADDER TUMOR EXCISION  2011 & 2013   Dr Karsten Ro  . CATARACT EXTRACTION, BILATERAL  2014   Dr Herbert Deaner  . COLONOSCOPY     negative; Dr Olevia Perches  . CYSTOSCOPY      multiple since tumor excision;Dr Ottelin     MEDICATIONS:   Current  Facility-Administered Medications:  .  acetaminophen (TYLENOL) tablet 650 mg, 650 mg, Oral, Q6H PRN, 650 mg at 01/14/18 1800 **OR** acetaminophen (TYLENOL) suppository 650 mg, 650 mg, Rectal, Q6H PRN, Patrecia Pour, Christean Grief, MD .  Derrill Memo ON 01/15/2018] amLODipine (NORVASC) tablet 5 mg, 5 mg, Oral, Daily, Patrecia Pour, Edwin, MD .  enoxaparin (LOVENOX) injection 40 mg, 40 mg, Subcutaneous, Q24H, Patrecia Pour, Christean Grief, MD, 40 mg at 01/14/18 2057 .  metoprolol tartrate (LOPRESSOR) tablet 25 mg, 25 mg, Oral, BID, Patrecia Pour, Christean Grief, MD, 25 mg at 01/14/18 2058 .  morphine 2 MG/ML injection 2 mg, 2 mg, Intravenous, Q4H PRN, Patrecia Pour, Edwin, MD .  ondansetron [facility]) tablet 4 mg, 4 mg, Oral, Q6H PRN **OR** ondansetron (ZOFRAN) injection 4 mg, 4 mg, Intravenous, Q6H PRN, Patrecia Pour, Christean Grief, MD  ALLERGIES:  No Known Allergies  REVIEW OF SYSTEMS: REVIEWED IN DETAIL IN CHART  FAMILY HISTORY:   Family History  Problem Relation Age of Onset  . Stroke Mother 68  . Heart attack Father 5  . Coronary artery disease Brother        S/P CBAG ? @  30  . Diabetes Brother   . Kidney disease Neg Hx   . Cancer Neg Hx   . Colon cancer Neg Hx   . Colon polyps Neg Hx     SOCIAL HISTORY:   Social History   Tobacco Use  . Smoking status: Never Smoker  . Smokeless tobacco: Never Used  Substance Use Topics  . Alcohol use: No    Alcohol/week: 0.0 oz     EXAMINATION: Vital signs in last 24 hours: Temp:  [97.6 F (36.4 C)-98.2 F (36.8 C)] 98.2 F (36.8 C) (06/30 2000) Pulse Rate:  [58-78] 78 (06/30 2000) Resp:  [14-22] 14 (06/30 2000) BP: (123-162)/(48-145) 123/48 (06/30 2000) SpO2:  [96 %-100 %] 100 % (06/30 1530) Weight:  [45.4 kg (100 lb)] 45.4 kg (100 lb) (06/30 1110)  BP (!) 123/48 (BP Location: Left Arm)   Pulse 78   Temp 98.2 F (36.8 C) (Oral)   Resp 14   Ht 5\' 4"  (1.626 m)   Wt 45.4 kg (100 lb)   SpO2 100%   BMI 17.16 kg/m   General Appearance:    Alert, cooperative, no distress,  appears stated age  Head:    Normocephalic, without obvious abnormality, atraumatic  Eyes:    PERRL, conjunctiva/corneas clear, EOM's intact, fundi    benign, both eyes  Ears:    Normal TM's and external ear canals, both ears  Nose:   Nares normal, septum midline, mucosa normal, no drainage    or sinus tenderness  Throat:   Lips, mucosa, and tongue normal; teeth and gums normal  Neck:   Supple, symmetrical, trachea midline, no adenopathy;    thyroid:  no enlargement/tenderness/nodules; no carotid   bruit or JVD  Back:     Symmetric, no curvature, ROM normal, no CVA tenderness  Lungs:     Clear to auscultation bilaterally, respirations unlabored  Chest Wall:    No tenderness or deformity   Heart:    Irregular rate and rhythm, S1 and S2 normal, no murmur, rub   or gallop  Breast Exam:    No tenderness, masses, or nipple abnormality  Abdomen:     Soft, non-tender, bowel sounds active all four quadrants,    no masses, no organomegaly  Genitalia:    Rectal:    Extremities:   Extremities normal, atraumatic, no cyanosis or edema  Pulses:   2+ and symmetric all extremities  Skin:   Skin color, texture, turgor normal, no rashes or lesions  Lymph nodes:   Cervical, supraclavicular, and axillary nodes normal  Neurologic:   CNII-XII intact, normal strength, sensation and reflexes    throughout    Musculoskeletal Exam:   Right leg SER and painful. Right elbow abrasion   DIAGNOSTIC STUDIES: Recent laboratory studies: Recent Labs    01/14/18 1123  WBC 9.1  HGB 12.5  HCT 38.0  PLT 285   Recent Labs    01/14/18 1123  NA 138  K 4.2  CL 102  CO2 28  BUN 19  CREATININE 0.72  GLUCOSE 142*  CALCIUM 9.6   Lab Results  Component Value Date   INR 0.96 01/14/2018     Recent Radiographic Studies :  Dg Chest 2 View  Result Date: 01/14/2018 CLINICAL DATA:  Fall. EXAM: CHEST - 2 VIEW COMPARISON:  October 06, 2008 FINDINGS: No pneumothorax. The heart, hila, mediastinum, lungs, and pleura  are normal. No other acute abnormalities. IMPRESSION: No acute abnormalities. Electronically Signed   By: Dorise Bullion III  M.D   On: 01/14/2018 13:02   Dg Pelvis 1-2 Views  Result Date: 01/14/2018 CLINICAL DATA:  Pt was wearing heels and fell onto concrete. NO head neck or back pain. No LOC, no blood thinners. Pulse motor sensation in right foot, no bruising or redness. Pain only when she moves to right thigh. Pt HX: non smoker, HTN EXAM: PELVIS - 1-2 VIEW COMPARISON:  CT 09/26/2008 FINDINGS: Mildly comminuted intratrochanteric fracture of the right femur, with fragments distracted at least 1.4 cm. No dislocation. Bony pelvis appears intact. Left hip unremarkable. Bilateral pelvic phleboliths. Degenerative disc disease in the lower lumbar spine. IMPRESSION: 1. Distracted right intertrochanteric femur fracture. Electronically Signed   By: Lucrezia Europe M.D.   On: 01/14/2018 13:05   Dg Femur, Min 2 Views Right  Result Date: 01/14/2018 CLINICAL DATA:  Fall onto concrete. EXAM: RIGHT FEMUR 2 VIEWS COMPARISON:  None. FINDINGS: Acute intertrochanteric right femur fracture with mild varus angulation. The hip is located. Osteopenia. IMPRESSION: Acute intertrochanteric right femur fracture. Electronically Signed   By: Monte Fantasia M.D.   On: 01/14/2018 13:03    ASSESSMENT:  Right hip intertrochanteric fracture   PLAN:  ORIF in AM with trochanteric nail.  Reviewed risks of anesthesia, infection, nonunion.  Will need to be PWB for awhile most likely and may need SNF placement or help at home.  Son is a PA and also has daughter nearby.  Cordarro Spinnato G 01/14/2018, 9:02 PM

## 2018-01-14 NOTE — Plan of Care (Signed)
  Problem: Education: Goal: Knowledge of General Education information will improve Outcome: Progressing   

## 2018-01-15 ENCOUNTER — Encounter (HOSPITAL_COMMUNITY): Payer: Self-pay | Admitting: Certified Registered Nurse Anesthetist

## 2018-01-15 ENCOUNTER — Inpatient Hospital Stay (HOSPITAL_COMMUNITY): Payer: Medicare Other | Admitting: Certified Registered Nurse Anesthetist

## 2018-01-15 ENCOUNTER — Inpatient Hospital Stay (HOSPITAL_COMMUNITY): Payer: Medicare Other

## 2018-01-15 ENCOUNTER — Encounter (HOSPITAL_COMMUNITY)
Admission: EM | Disposition: A | Discharge status: Home Health | Payer: Self-pay | Source: Home / Self Care | Attending: Internal Medicine

## 2018-01-15 DIAGNOSIS — S728X1A Other fracture of right femur, initial encounter for closed fracture: Secondary | ICD-10-CM

## 2018-01-15 DIAGNOSIS — Z9289 Personal history of other medical treatment: Secondary | ICD-10-CM

## 2018-01-15 HISTORY — DX: Personal history of other medical treatment: Z92.89

## 2018-01-15 HISTORY — PX: INTRAMEDULLARY (IM) NAIL INTERTROCHANTERIC: SHX5875

## 2018-01-15 LAB — BASIC METABOLIC PANEL
Anion gap: 9 (ref 5–15)
BUN: 16 mg/dL (ref 8–23)
CHLORIDE: 99 mmol/L (ref 98–111)
CO2: 27 mmol/L (ref 22–32)
Calcium: 8.7 mg/dL — ABNORMAL LOW (ref 8.9–10.3)
Creatinine, Ser: 0.73 mg/dL (ref 0.44–1.00)
GFR calc Af Amer: >60 mL/min (ref >60)
GFR calc non Af Amer: >60 mL/min (ref >60)
Glucose, Bld: 136 mg/dL — ABNORMAL HIGH (ref 70–99)
Potassium: 4.3 mmol/L (ref 3.5–5.1)
Sodium: 135 mmol/L (ref 135–145)

## 2018-01-15 LAB — CBC
HEMATOCRIT: 30 % — AB (ref 36.0–46.0)
HEMOGLOBIN: 9.5 g/dL — AB (ref 12.0–15.0)
MCH: 31.1 pg (ref 26.0–34.0)
MCHC: 31.7 g/dL (ref 30.0–36.0)
MCV: 98.4 fL (ref 78.0–100.0)
Platelets: 235 10*3/uL (ref 150–400)
RBC: 3.05 MIL/uL — AB (ref 3.87–5.11)
RDW: 12.2 % (ref 11.5–15.5)
WBC: 8.8 10*3/uL (ref 4.0–10.5)

## 2018-01-15 LAB — PROTIME-INR
INR: 1.06
Prothrombin Time: 13.7 seconds (ref 11.4–15.2)

## 2018-01-15 LAB — ABO/RH: ABO/RH(D): A POS

## 2018-01-15 LAB — GLUCOSE, CAPILLARY: GLUCOSE-CAPILLARY: 201 mg/dL — AB (ref 70–99)

## 2018-01-15 SURGERY — FIXATION, FRACTURE, INTERTROCHANTERIC, WITH INTRAMEDULLARY ROD
Anesthesia: Spinal | Site: Hip | Laterality: Right

## 2018-01-15 MED ORDER — LACTATED RINGERS IV SOLN
INTRAVENOUS | Status: DC | PRN
  Administered 2018-01-15: 07:00:00 via INTRAVENOUS

## 2018-01-15 MED ORDER — PHENYLEPHRINE 40 MCG/ML (10ML) SYRINGE FOR IV PUSH (FOR BLOOD PRESSURE SUPPORT)
PREFILLED_SYRINGE | INTRAVENOUS | Status: AC
  Filled 2018-01-15: qty 10

## 2018-01-15 MED ORDER — MORPHINE SULFATE (PF) 2 MG/ML IV SOLN: 0.5000 mg | INTRAVENOUS | Status: DC | PRN

## 2018-01-15 MED ORDER — ONDANSETRON HCL 4 MG PO TABS: 4.0000 mg | ORAL_TABLET | Freq: Four times a day (QID) | ORAL | Status: DC | PRN

## 2018-01-15 MED ORDER — 0.9 % SODIUM CHLORIDE (POUR BTL) OPTIME
TOPICAL | Status: DC | PRN
  Administered 2018-01-15: 1000 mL

## 2018-01-15 MED ORDER — HYDROCODONE-ACETAMINOPHEN 5-325 MG PO TABS
1.0000 | ORAL_TABLET | ORAL | Status: DC | PRN
  Administered 2018-01-15 – 2018-01-16 (×2): 2 via ORAL
  Administered 2018-01-16: 1 via ORAL
  Administered 2018-01-17: 2 via ORAL
  Filled 2018-01-15: qty 2
  Filled 2018-01-15: qty 1
  Filled 2018-01-15 (×2): qty 2

## 2018-01-15 MED ORDER — PHENYLEPHRINE HCL 10 MG/ML IJ SOLN
INTRAVENOUS | Status: DC | PRN
  Administered 2018-01-15: 25 ug/min via INTRAVENOUS

## 2018-01-15 MED ORDER — CEFAZOLIN SODIUM-DEXTROSE 2-3 GM-%(50ML) IV SOLR
INTRAVENOUS | Status: DC | PRN
  Administered 2018-01-15: 2 g via INTRAVENOUS

## 2018-01-15 MED ORDER — MEPERIDINE HCL 50 MG/ML IJ SOLN: 6.2500 mg | INTRAMUSCULAR | Status: DC | PRN

## 2018-01-15 MED ORDER — METOCLOPRAMIDE HCL 5 MG/ML IJ SOLN: 5.0000 mg | Freq: Three times a day (TID) | INTRAMUSCULAR | Status: DC | PRN

## 2018-01-15 MED ORDER — ONDANSETRON HCL 4 MG/2ML IJ SOLN
INTRAMUSCULAR | Status: AC
  Filled 2018-01-15: qty 2

## 2018-01-15 MED ORDER — METOCLOPRAMIDE HCL 5 MG/ML IJ SOLN: 10.0000 mg | Freq: Once | INTRAMUSCULAR | Status: DC | PRN

## 2018-01-15 MED ORDER — PROPOFOL 10 MG/ML IV BOLUS
INTRAVENOUS | Status: AC
  Filled 2018-01-15: qty 20

## 2018-01-15 MED ORDER — ACETAMINOPHEN 325 MG PO TABS: 325.0000 mg | ORAL_TABLET | Freq: Four times a day (QID) | ORAL | Status: DC | PRN

## 2018-01-15 MED ORDER — PHENOL 1.4 % MT LIQD: 1.0000 | OROMUCOSAL | Status: DC | PRN

## 2018-01-15 MED ORDER — DOCUSATE SODIUM 100 MG PO CAPS
100.0000 mg | ORAL_CAPSULE | Freq: Two times a day (BID) | ORAL | Status: DC
  Administered 2018-01-15 – 2018-01-17 (×4): 100 mg via ORAL
  Filled 2018-01-15 (×4): qty 1

## 2018-01-15 MED ORDER — BUPIVACAINE IN DEXTROSE 0.75-8.25 % IT SOLN
INTRATHECAL | Status: DC | PRN
  Administered 2018-01-15: 1.5 mL via INTRATHECAL

## 2018-01-15 MED ORDER — FENTANYL CITRATE (PF) 100 MCG/2ML IJ SOLN: 25.0000 ug | INTRAMUSCULAR | Status: DC | PRN

## 2018-01-15 MED ORDER — METOCLOPRAMIDE HCL 5 MG PO TABS: 5.0000 mg | ORAL_TABLET | Freq: Three times a day (TID) | ORAL | Status: DC | PRN

## 2018-01-15 MED ORDER — FENTANYL CITRATE (PF) 250 MCG/5ML IJ SOLN
INTRAMUSCULAR | Status: AC
  Filled 2018-01-15: qty 5

## 2018-01-15 MED ORDER — LIDOCAINE HCL (CARDIAC) PF 100 MG/5ML IV SOSY
PREFILLED_SYRINGE | INTRAVENOUS | Status: DC | PRN
  Administered 2018-01-15: 50 mg via INTRAVENOUS

## 2018-01-15 MED ORDER — PROPOFOL 500 MG/50ML IV EMUL
INTRAVENOUS | Status: DC | PRN
  Administered 2018-01-15: 50 ug/kg/min via INTRAVENOUS

## 2018-01-15 MED ORDER — ONDANSETRON HCL 4 MG/2ML IJ SOLN: 4.0000 mg | Freq: Four times a day (QID) | INTRAMUSCULAR | Status: DC | PRN

## 2018-01-15 MED ORDER — ASPIRIN EC 325 MG PO TBEC
325.0000 mg | DELAYED_RELEASE_TABLET | Freq: Two times a day (BID) | ORAL | Status: DC
  Administered 2018-01-15 – 2018-01-17 (×4): 325 mg via ORAL
  Filled 2018-01-15 (×4): qty 1

## 2018-01-15 MED ORDER — HYDROCODONE-ACETAMINOPHEN 7.5-325 MG PO TABS
1.0000 | ORAL_TABLET | ORAL | Status: DC | PRN
  Administered 2018-01-15: 1 via ORAL
  Filled 2018-01-15: qty 1

## 2018-01-15 MED ORDER — MENTHOL 3 MG MT LOZG: 1.0000 | LOZENGE | OROMUCOSAL | Status: DC | PRN

## 2018-01-15 MED ORDER — ACETAMINOPHEN 500 MG PO TABS
500.0000 mg | ORAL_TABLET | Freq: Four times a day (QID) | ORAL | Status: AC
  Administered 2018-01-15 – 2018-01-16 (×2): 500 mg via ORAL
  Filled 2018-01-15 (×3): qty 1

## 2018-01-15 MED ORDER — LIDOCAINE 2% (20 MG/ML) 5 ML SYRINGE
INTRAMUSCULAR | Status: AC
  Filled 2018-01-15: qty 5

## 2018-01-15 SURGICAL SUPPLY — 52 items
BIT DRILL 4.3MMS DISTAL GRDTED (BIT) IMPLANT
CLSR STERI-STRIP ANTIMIC 1/2X4 (GAUZE/BANDAGES/DRESSINGS) ×1 IMPLANT
CORTICAL BONE SCR 5.0MM X 48MM (Screw) ×2 IMPLANT
COVER BACK TABLE 80X110 HD (DRAPES) ×2 IMPLANT
COVER PERINEAL POST (MISCELLANEOUS) ×2 IMPLANT
COVER SURGICAL LIGHT HANDLE (MISCELLANEOUS) ×1 IMPLANT
DRAPE HALF SHEET 40X57 (DRAPES) ×2 IMPLANT
DRAPE STERI IOBAN 125X83 (DRAPES) ×2 IMPLANT
DRAPE U-SHAPE 47X51 STRL (DRAPES) ×2 IMPLANT
DRILL 4.3MMS DISTAL GRADUATED (BIT) ×2
DRSG ADAPTIC 3X8 NADH LF (GAUZE/BANDAGES/DRESSINGS) ×2 IMPLANT
DRSG MEPILEX BORDER 4X12 (GAUZE/BANDAGES/DRESSINGS) ×1 IMPLANT
DRSG MEPILEX BORDER 4X4 (GAUZE/BANDAGES/DRESSINGS) ×2 IMPLANT
DRSG MEPILEX BORDER 4X8 (GAUZE/BANDAGES/DRESSINGS) ×2 IMPLANT
DURAPREP 26ML APPLICATOR (WOUND CARE) ×2 IMPLANT
ELECT CAUTERY BLADE 6.4 (BLADE) ×2 IMPLANT
ELECT REM PT RETURN 9FT ADLT (ELECTROSURGICAL) ×2
ELECTRODE REM PT RTRN 9FT ADLT (ELECTROSURGICAL) ×1 IMPLANT
EVACUATOR 1/8 PVC DRAIN (DRAIN) IMPLANT
GLOVE BIO SURGEON STRL SZ8 (GLOVE) ×8 IMPLANT
GLOVE BIOGEL PI IND STRL 8 (GLOVE) ×1 IMPLANT
GLOVE BIOGEL PI INDICATOR 8 (GLOVE) ×1
GOWN STRL REUS W/ TWL LRG LVL3 (GOWN DISPOSABLE) ×2 IMPLANT
GOWN STRL REUS W/ TWL XL LVL3 (GOWN DISPOSABLE) ×3 IMPLANT
GOWN STRL REUS W/TWL 2XL LVL3 (GOWN DISPOSABLE) ×2 IMPLANT
GOWN STRL REUS W/TWL LRG LVL3 (GOWN DISPOSABLE) ×4
GOWN STRL REUS W/TWL XL LVL3 (GOWN DISPOSABLE) ×6
GUIDEPIN 3.2X17.5 THRD DISP (PIN) ×1 IMPLANT
GUIDEWIRE BALL NOSE 80CM (WIRE) ×1 IMPLANT
HFN RH 130 DEG 11MM X 360MM (Orthopedic Implant) ×1 IMPLANT
KIT BASIN OR (CUSTOM PROCEDURE TRAY) ×2 IMPLANT
KIT TURNOVER KIT B (KITS) ×2 IMPLANT
LINER BOOT UNIVERSAL DISP (MISCELLANEOUS) ×2 IMPLANT
MANIFOLD NEPTUNE II (INSTRUMENTS) ×2 IMPLANT
NS IRRIG 1000ML POUR BTL (IV SOLUTION) ×2 IMPLANT
PACK GENERAL/GYN (CUSTOM PROCEDURE TRAY) ×2 IMPLANT
PAD ARMBOARD 7.5X6 YLW CONV (MISCELLANEOUS) ×4 IMPLANT
SCREW CORTICL BON 5.0MM X 48MM (Screw) IMPLANT
SCREW LAG HIP NAIL 10.5X95 (Screw) ×1 IMPLANT
SCREWDRIVER DIST 3.5 281001019 (MISCELLANEOUS) ×1 IMPLANT
STAPLER VISISTAT 35W (STAPLE) ×2 IMPLANT
SUT VIC AB 0 CT1 27 (SUTURE) ×4
SUT VIC AB 0 CT1 27XBRD ANBCTR (SUTURE) ×2 IMPLANT
SUT VIC AB 1 CTB1 27 (SUTURE) ×4 IMPLANT
SUT VIC AB 2-0 CT1 27 (SUTURE) ×4
SUT VIC AB 2-0 CT1 36 (SUTURE) ×2 IMPLANT
SUT VIC AB 2-0 CT1 TAPERPNT 27 (SUTURE) ×2 IMPLANT
SUT VIC AB 3-0 SH 27 (SUTURE) ×2
SUT VIC AB 3-0 SH 27XBRD (SUTURE) IMPLANT
TOWEL OR 17X24 6PK STRL BLUE (TOWEL DISPOSABLE) ×2 IMPLANT
TOWEL OR 17X26 10 PK STRL BLUE (TOWEL DISPOSABLE) ×2 IMPLANT
WATER STERILE IRR 1000ML POUR (IV SOLUTION) ×2 IMPLANT

## 2018-01-15 NOTE — Anesthesia Postprocedure Evaluation (Signed)
Anesthesia Post Note  Patient: [name]  Procedure(s) Performed: INTRAMEDULLARY (IM) NAIL INTERTROCHANTRIC (Right Hip)     Patient location during evaluation: PACU Anesthesia Type: Spinal Level of consciousness: awake and alert Pain management: pain level controlled Vital Signs Assessment: post-procedure vital signs reviewed and stable Respiratory status: spontaneous breathing and respiratory function stable Cardiovascular status: blood pressure returned to baseline and stable Postop Assessment: no headache, no backache, spinal receding and no apparent nausea or vomiting Anesthetic complications: no    Last Vitals:  Vitals:   01/15/18 1019 01/15/18 1033  BP: (!) 109/48 (!) 126/54  Pulse: 66 68  Resp: 20 16  Temp:  36.4 C  SpO2: 98% 99%    Last Pain:  Vitals:   01/15/18 1100  TempSrc:   PainSc: 0-No pain                 Montez Hageman

## 2018-01-15 NOTE — Interval H&P Note (Signed)
History and Physical Interval Note:  01/15/2018 7:12 AM  [name]  has presented today for surgery, with the diagnosis of Right intertrochanteric fracture  The various methods of treatment have been discussed with the patient and family. After consideration of risks, benefits and other options for treatment, the patient has consented to  Procedure(s): INTRAMEDULLARY (IM) NAIL INTERTROCHANTRIC (Right) as a surgical intervention .  The patient's history has been reviewed, patient examined, no change in status, stable for surgery.  I have reviewed the patient's chart and labs.  Questions were answered to the patient's satisfaction.     Tawan Corkern G

## 2018-01-15 NOTE — Transfer of Care (Signed)
Immediate Anesthesia Transfer of Care Note  Patient: [name]  Procedure(s) Performed: INTRAMEDULLARY (IM) NAIL INTERTROCHANTRIC (Right Hip)  Patient Location: PACU  Anesthesia Type:spinal MAC   Level of Consciousness: awake, alert , oriented and patient cooperative  Airway & Oxygen Therapy: Patient Spontanous Breathing  Post-op Assessment: Report given to RN and Post -op Vital signs reviewed and stable  Post vital signs: Reviewed and stable  Last Vitals:  Vitals Value Taken Time  BP 87/38 01/15/2018  9:38 AM  Temp    Pulse 69 01/15/2018  9:39 AM  Resp 19 01/15/2018  9:39 AM  SpO2 100 % 01/15/2018  9:39 AM  Vitals shown include unvalidated device data.  Last Pain:  Vitals:   01/15/18 0414  TempSrc: Oral  PainSc:       Patients Stated Pain Goal: 2 (78/67/67 2094)  Complications: No apparent anesthesia complications

## 2018-01-15 NOTE — Anesthesia Procedure Notes (Signed)
Procedure Name: MAC Date/Time: 01/15/2018 7:38 AM Performed by: Oletta Lamas, CRNA Pre-anesthesia Checklist: Patient identified, Emergency Drugs available, Suction available, Patient being monitored and Timeout performed Patient Re-evaluated:Patient Re-evaluated prior to induction Oxygen Delivery Method: Simple face mask

## 2018-01-15 NOTE — Progress Notes (Signed)
Pt transported via bed to OR for surgery with Dr. Norm Salt; Pre-op checklist completed, pt voided via [place] @ 0615.

## 2018-01-15 NOTE — Op Note (Signed)
[name] [id number] 01/15/2018   PRE-OP DIAGNOSIS: right hip intertroch fracture  POST-OP DIAGNOSIS: same  PROCEDURE: right hip ORIF with troch nail  ANESTHESIA: spinal and sedation  Zaydyn Havey G   Dictation #:  [id number]

## 2018-01-15 NOTE — Op Note (Signed)
[name], TIEGS MEDICAL RECORD [id number] ACCOUNT [id number] DATE OF BIRTH:April 12, 1930 FACILITY: [facility] LOCATION: [facility] PHYSICIAN:Hartleigh Edmonston [name], MD  OPERATIVE REPORT  DATE OF PROCEDURE:  01/15/2018  PREOPERATIVE DIAGNOSIS:  Right hip intertrochanteric fracture.  POSTOPERATIVE DIAGNOSIS:  Right hip intertrochanteric fracture.  PROCEDURE PERFORMED:  Right hip open reduction internal fixation with trochanteric nail.  ANESTHESIA:  Spinal and sedation.  SURGEON:  Melrose Nakayama, MD  ASSISTANT:  Loni Dolly, PA-C  INDICATIONS:  The patient is an 84 year old woman who lives independently.  She fell at home yesterday and suffered a very comminuted intertrochanteric fracture of the right hip.  With an inability to stand and walk, she was taken to the emergency room.   She was admitted to the medicine service and cleared for surgery and set up for today's operation.  Informed operative consent was obtained after discussion of possible complications including reaction to anesthesia, infection, DVT, nonunion, and death.  SUMMARY OF FINDINGS:  Under spinal anesthesia and some sedation through some small incisions, we performed an ORIF of the right hip with an 11 x 36 Affixus [employer] nail.  We locked this both proximally and distally.  I used fluoroscopy throughout the case  to make appropriate intraoperative decisions and read all of these views myself.  Loni Dolly assisted throughout and was invaluable to the completion of the case, and he helped maintain reduction and pass instruments while I performed the procedure.   She seemed to have fairly good bone quality, and we achieved a stable reduction.  DESCRIPTION OF PROCEDURE:  The patient was taken to the operating suite where spinal anesthetic was applied without difficulty.  She was positioned supine on the fracture table.  With some traction and slight internal rotation, we managed to reduce the  fracture fairly well.  She was then  prepped and draped in normal sterile fashion.  After the administration of IV Kefzol and appropriate timeout, we made a small incision just proximal to the greater trochanter.  I placed a guidewire into the appropriate  position.  It seemed to be intramedullary all the way down the leg.  We over reamed with the starter reamer.  We tried to keep our reduction with some pressure on the lateral cortex.  I reamed to a 13 quite easily, and then placed an 11 x 36 Affixus  nail in appropriate position.  We placed a proximal locking screw up into the femoral head.  It seemed to be fairly central on 2 views fluoroscopically.  She had excellent bone quality.  We were able to compress and then lock the nail proximally.  We  then went down the leg, and using fluoroscopic guidance, placed a single locking screw distally to control rotation and shortening.  Again, she had good cortical bone down here.  All wounds were irrigated, followed by reapproximation of the deep tissues  with Vicryl and skin with a subcuticular stitch.  Adaptic was applied followed by dry gauze and tape.  ESTIMATED BLOOD LOSS:  None.  FLUIDS:  Obtain from anesthesia records.  DISPOSITION:  The patient was taken to recovery room in stable condition.  She was to be admitted back to the medicine service for appropriate care.  We will have her remain partial weightbearing for a month or so.  LN/NUANCE  D:01/15/2018 T:01/15/2018 JOB:[id number]

## 2018-01-15 NOTE — Progress Notes (Signed)
PROGRESS NOTE    [name]  QQV:[id number] DOB: 1929-11-21 DOA: 01/14/2018 PCP: Binnie Rail, MD     Brief Narrative:  [name] is a 84 y.o. female with medical history significant of hypertension, A. fib not on anticoagulation presented to the emergency department after mechanical fall.  Patient was leaving her house to go to chair and her heel got caught up in her doormat.  Patient fell on her right side.  She denies hitting her head, losing consciousness or having weakness.  Patient subsequently developed pain on her right hip and was unable to move her right lower extremity. Xray showed acute intra-trochanteric right femoral fracture. EDP consulted orthopedic surgery and recommended to admit to [facility] hospital for surgery.   New events last 24 hours / Subjective: Underwent ORIF this morning without acute complication. Currently pain well controlled with medications. No new complaints.   Assessment & Plan:   Active Problems:   Femur fracture, right (HCC)   Hip fracture (HCC)   Right intertrochanteric femoral fracture S/p ORIF PT OT eval  Pain control   Hypertension BP stable, continue metoprolol and Norvasc  Paroxysmal A. fib Rate well controlled, patient not on anticoagulation at home.     DVT prophylaxis: Lovenox Code Status: Full Family Communication: At bedside Disposition Plan: Pending post-surgical improvement, PT OT    Consultants:   Orthopedic surgery   Procedures:   ORIF with trochanteric nail 7/1 Dr. Rhona Raider   Antimicrobials:  Anti-infectives (From admission, onward)   None        Objective: Vitals:   01/15/18 1005 01/15/18 1019 01/15/18 1033 01/15/18 1405  BP: (!) 117/42 (!) 109/48 (!) 126/54 (!) 105/40  Pulse: 64 66 68 78  Resp: 15 20 16 16   Temp: (!) 97.3 F (36.3 C)  97.6 F (36.4 C) 98.4 F (36.9 C)  TempSrc:   Oral Oral  SpO2: 98% 98% 99% 97%  Weight:      Height:        Intake/Output Summary (Last 24 hours) at  01/15/2018 1418 Last data filed at 01/15/2018 1020 Gross per 24 hour  Intake 1050 ml  Output 50 ml  Net 1000 ml   Filed Weights   01/14/18 1110  Weight: 45.4 kg (100 lb)    Examination:  General exam: Appears calm and comfortable  Respiratory system: Clear to auscultation. Respiratory effort normal. Cardiovascular system: S1 & S2 heard, RRR. No JVD, murmurs, rubs, gallops or clicks. No pedal edema. Gastrointestinal system: Abdomen is nondistended, soft and nontender. No organomegaly or masses felt. Normal bowel sounds heard. Central nervous system: Alert and oriented. No focal neurological deficits. Extremities: Symmetric in appearance . Skin: No rashes, lesions or ulcers Psychiatry: Judgement and insight appear normal. Mood & affect appropriate.   Data Reviewed: I have personally reviewed following labs and imaging studies  CBC: Recent Labs  Lab 01/14/18 1123 01/15/18 0420  WBC 9.1 8.8  NEUTROABS 7.5  --   HGB 12.5 9.5*  HCT 38.0 30.0*  MCV 97.9 98.4  PLT 285 332   Basic Metabolic Panel: Recent Labs  Lab 01/14/18 1123 01/15/18 0420  NA 138 135  K 4.2 4.3  CL 102 99  CO2 28 27  GLUCOSE 142* 136*  BUN 19 16  CREATININE 0.72 0.73  CALCIUM 9.6 8.7*   GFR: Estimated Creatinine Clearance: 36.2 mL/min (by C-G formula based on SCr of 0.73 mg/dL). Liver Function Tests: No results for input(s): AST, ALT, ALKPHOS, BILITOT, PROT, ALBUMIN in  the last 168 hours. No results for input(s): LIPASE, AMYLASE in the last 168 hours. No results for input(s): AMMONIA in the last 168 hours. Coagulation Profile: Recent Labs  Lab 01/14/18 1123 01/15/18 0420  INR 0.96 1.06   Cardiac Enzymes: No results for input(s): CKTOTAL, CKMB, CKMBINDEX, TROPONINI in the last 168 hours. BNP (last 3 results) No results for input(s): PROBNP in the last 8760 hours. HbA1C: No results for input(s): HGBA1C in the last 72 hours. CBG: No results for input(s): GLUCAP in the last 168 hours. Lipid  Profile: No results for input(s): CHOL, HDL, LDLCALC, TRIG, CHOLHDL, LDLDIRECT in the last 72 hours. Thyroid Function Tests: No results for input(s): TSH, T4TOTAL, FREET4, T3FREE, THYROIDAB in the last 72 hours. Anemia Panel: No results for input(s): VITAMINB12, FOLATE, FERRITIN, TIBC, IRON, RETICCTPCT in the last 72 hours. Sepsis Labs: No results for input(s): PROCALCITON, LATICACIDVEN in the last 168 hours.  Recent Results (from the past 240 hour(s))  Surgical pcr screen     Status: Abnormal   Collection Time: 01/14/18  6:19 PM  Result Value Ref Range Status   MRSA, PCR NEGATIVE NEGATIVE Final   Staphylococcus aureus POSITIVE (A) NEGATIVE Final    Comment: (NOTE) The Xpert SA Assay (FDA approved for NASAL specimens in patients 44 years of age and older), is one component of a comprehensive surveillance program. It is not intended to diagnose infection nor to guide or monitor treatment. Performed at [facility], [place] [address] [place],  [address]        Radiology Studies: Dg Chest 2 View  Result Date: 01/14/2018 CLINICAL DATA:  Fall. EXAM: CHEST - 2 VIEW COMPARISON:  October 06, 2008 FINDINGS: No pneumothorax. The heart, hila, mediastinum, lungs, and pleura are normal. No other acute abnormalities. IMPRESSION: No acute abnormalities. Electronically Signed   By: Dorise Bullion III M.D   On: 01/14/2018 13:02   Dg Pelvis 1-2 Views  Result Date: 01/14/2018 CLINICAL DATA:  Pt was wearing heels and fell onto concrete. NO head neck or back pain. No LOC, no blood thinners. Pulse motor sensation in right foot, no bruising or redness. Pain only when she moves to right thigh. Pt HX: non smoker, HTN EXAM: PELVIS - 1-2 VIEW COMPARISON:  CT 09/26/2008 FINDINGS: Mildly comminuted intratrochanteric fracture of the right femur, with fragments distracted at least 1.4 cm. No dislocation. Bony pelvis appears intact. Left hip unremarkable. Bilateral pelvic phleboliths. Degenerative disc  disease in the lower lumbar spine. IMPRESSION: 1. Distracted right intertrochanteric femur fracture. Electronically Signed   By: Lucrezia Europe M.D.   On: 01/14/2018 13:05   Dg C-arm 1-60 Min  Result Date: 01/15/2018 CLINICAL DATA:  Right intratrochanteric fracture EXAM: RIGHT FEMUR 2 VIEWS; DG C-ARM 61-120 MIN COMPARISON:  01/14/2018 FLUOROSCOPY TIME:  Radiation Exposure Index (as provided by the fluoroscopic device): Not available If the device does not provide the exposure index: Fluoroscopy Time:  2 minutes 28 seconds Number of Acquired Images:  For FINDINGS: Medullary rod is noted within the right femur. Fixation screws noted traversing the femoral neck. The fracture fragments are in near anatomic alignment. IMPRESSION: ORIF of proximal right femoral fracture. Electronically Signed   By: Inez Catalina M.D.   On: 01/15/2018 09:30   Dg Femur, Min 2 Views Right  Result Date: 01/15/2018 CLINICAL DATA:  Right intratrochanteric fracture EXAM: RIGHT FEMUR 2 VIEWS; DG C-ARM 61-120 MIN COMPARISON:  01/14/2018 FLUOROSCOPY TIME:  Radiation Exposure Index (as provided by the fluoroscopic device): Not  available If the device does not provide the exposure index: Fluoroscopy Time:  2 minutes 28 seconds Number of Acquired Images:  For FINDINGS: Medullary rod is noted within the right femur. Fixation screws noted traversing the femoral neck. The fracture fragments are in near anatomic alignment. IMPRESSION: ORIF of proximal right femoral fracture. Electronically Signed   By: Inez Catalina M.D.   On: 01/15/2018 09:30   Dg Femur, Min 2 Views Right  Result Date: 01/14/2018 CLINICAL DATA:  Fall onto concrete. EXAM: RIGHT FEMUR 2 VIEWS COMPARISON:  None. FINDINGS: Acute intertrochanteric right femur fracture with mild varus angulation. The hip is located. Osteopenia. IMPRESSION: Acute intertrochanteric right femur fracture. Electronically Signed   By: Monte Fantasia M.D.   On: 01/14/2018 13:03      Scheduled Meds: .  acetaminophen  500 mg Oral Q6H  . amLODipine  5 mg Oral Daily  . aspirin EC  325 mg Oral BID AC & HS  . docusate sodium  100 mg Oral BID  . metoprolol tartrate  25 mg Oral BID   Continuous Infusions:   LOS: 1 day    Time spent: 25 minutes   Janet Phi, DO [facility] [email or web address] Password [email or web address] 01/15/2018, 2:18 PM

## 2018-01-15 NOTE — Anesthesia Preprocedure Evaluation (Signed)
Anesthesia Evaluation  Patient identified by MRN, date of birth, ID band Patient awake    Reviewed: Allergy & Precautions, NPO status , Patient's Chart, lab work & pertinent test results  Airway Mallampati: II  TM Distance: >3 FB Neck ROM: Full    Dental no notable dental hx.    Pulmonary neg pulmonary ROS,    Pulmonary exam normal breath sounds clear to auscultation       Cardiovascular hypertension, Pt. on medications Normal cardiovascular exam+ dysrhythmias Atrial Fibrillation  Rhythm:Regular Rate:Normal     Neuro/Psych negative neurological ROS  negative psych ROS   GI/Hepatic negative GI ROS, Neg liver ROS,   Endo/Other  negative endocrine ROS  Renal/GU negative Renal ROS  negative genitourinary   Musculoskeletal negative musculoskeletal ROS (+)   Abdominal   Peds negative pediatric ROS (+)  Hematology negative hematology ROS (+)   Anesthesia Other Findings   Reproductive/Obstetrics negative OB ROS                             Anesthesia Physical Anesthesia Plan  ASA: II  Anesthesia Plan: Spinal   Post-op Pain Management:    Induction: Intravenous  PONV Risk Score and Plan:   Airway Management Planned: Simple Face Mask  Additional Equipment:   Intra-op Plan:   Post-operative Plan:   Informed Consent: I have reviewed the patients History and Physical, chart, labs and discussed the procedure including the risks, benefits and alternatives for the proposed anesthesia with the patient or authorized representative who has indicated his/her understanding and acceptance.   Dental advisory given  Plan Discussed with: CRNA  Anesthesia Plan Comments:         Anesthesia Quick Evaluation

## 2018-01-15 NOTE — Anesthesia Procedure Notes (Signed)
Spinal  Patient location during procedure: OR Staffing Anesthesiologist: Alyxandria Wentz, MD Performed: anesthesiologist  Preanesthetic Checklist Completed: patient identified, site marked, surgical consent, pre-op evaluation, timeout performed, IV checked, risks and benefits discussed and monitors and equipment checked Spinal Block Patient position: sitting Prep: Betadine Patient monitoring: heart rate, continuous pulse ox and blood pressure Approach: right paramedian Location: L3-4 Injection technique: single-shot Needle Needle type: Spinocan  Needle gauge: 22 G Needle length: 9 cm Additional Notes Expiration date of kit checked and confirmed. Patient tolerated procedure well, without complications.       

## 2018-01-16 ENCOUNTER — Encounter (HOSPITAL_COMMUNITY): Payer: Self-pay | Admitting: Orthopaedic Surgery

## 2018-01-16 LAB — BASIC METABOLIC PANEL
Anion gap: 3 — ABNORMAL LOW (ref 5–15)
BUN: 24 mg/dL — AB (ref 8–23)
CALCIUM: 8 mg/dL — AB (ref 8.9–10.3)
CO2: 30 mmol/L (ref 22–32)
CREATININE: 0.86 mg/dL (ref 0.44–1.00)
Chloride: 100 mmol/L (ref 98–111)
GFR, EST NON AFRICAN AMERICAN: 59 mL/min — AB (ref >60)
Glucose, Bld: 141 mg/dL — ABNORMAL HIGH (ref 70–99)
Potassium: 4.4 mmol/L (ref 3.5–5.1)
SODIUM: 133 mmol/L — AB (ref 135–145)

## 2018-01-16 LAB — CBC
HCT: 21.8 % — ABNORMAL LOW (ref 36.0–46.0)
HEMOGLOBIN: 7 g/dL — AB (ref 12.0–15.0)
MCH: 32 pg (ref 26.0–34.0)
MCHC: 32.1 g/dL (ref 30.0–36.0)
MCV: 99.5 fL (ref 78.0–100.0)
Platelets: 157 10*3/uL (ref 150–400)
RBC: 2.19 MIL/uL — ABNORMAL LOW (ref 3.87–5.11)
RDW: 12.3 % (ref 11.5–15.5)
WBC: 6.5 10*3/uL (ref 4.0–10.5)

## 2018-01-16 LAB — PREPARE RBC (CROSSMATCH)

## 2018-01-16 LAB — HEMOGLOBIN AND HEMATOCRIT, BLOOD
HEMATOCRIT: 22.5 % — AB (ref 36.0–46.0)
HEMOGLOBIN: 7 g/dL — AB (ref 12.0–15.0)

## 2018-01-16 MED ORDER — SODIUM CHLORIDE 0.9% IV SOLUTION: Freq: Once | INTRAVENOUS | Status: DC

## 2018-01-16 NOTE — Progress Notes (Signed)
1st unit of PRBCs started as ordered.  Patient was given verbal instructions on signs and symptoms to notify nurse in regards to the blood transion.

## 2018-01-16 NOTE — Progress Notes (Signed)
Subjective: 1 Day Post-Op Procedure(s) (LRB): INTRAMEDULLARY (IM) NAIL INTERTROCHANTRIC (Right)   Patient sitting up in bedside chair. She is currently receiving a blood transfusion. She feels well and has been up walking well with PT.  Activity level:  Partial WB right let Diet tolerance:  ok Voiding:  ok Patient reports pain as mild.    Objective: Vital signs in last 24 hours: Temp:  [97.8 F (36.6 C)-98.6 F (37 C)] 98.6 F (37 C) (07/02 1215) Pulse Rate:  [52-87] 70 (07/02 1215) Resp:  [14-18] 18 (07/02 1215) BP: (95-142)/(42-76) 103/44 (07/02 1215) SpO2:  [95 %-100 %] 100 % (07/02 1215)  Labs: Recent Labs    01/14/18 1123 01/15/18 0420 01/16/18 0353 01/16/18 0746  HGB 12.5 9.5* 7.0* 7.0*   Recent Labs    01/15/18 0420 01/16/18 0353 01/16/18 0746  WBC 8.8 6.5  --   RBC 3.05* 2.19*  --   HCT 30.0* 21.8* 22.5*  PLT 235 157  --    Recent Labs    01/15/18 0420 01/16/18 0353  NA 135 133*  K 4.3 4.4  CL 99 100  CO2 27 30  BUN 16 24*  CREATININE 0.73 0.86  GLUCOSE 136* 141*  CALCIUM 8.7* 8.0*   Recent Labs    01/14/18 1123 01/15/18 0420  INR 0.96 1.06    Physical Exam:  Neurologically intact ABD soft Neurovascular intact Sensation intact distally Intact pulses distally Dorsiflexion/Plantar flexion intact Incision: dressing C/D/I and no drainage No cellulitis present Compartment soft  Assessment/Plan:  1 Day Post-Op Procedure(s) (LRB): INTRAMEDULLARY (IM) NAIL INTERTROCHANTRIC (Right) Advance diet Up with therapy  We greatly appreciate medical management. Discharge will be based off of PT and medicine recommendations. Once she gets her blood and passes PT she is good to go from out standpoint. Continue on ASA 325mg  BID x 4 weeks for DVT prevention. Follow up in office 2 weeks post op. Continue partial weightbearing right leg.  Carlea Badour, Larwance Sachs 01/16/2018, 3:01 PM

## 2018-01-16 NOTE — Evaluation (Signed)
Physical Therapy Evaluation Patient Details Name: [name] MRN: [id number] DOB: Oct 19, 1929 Today's Date: 01/16/2018   History of Present Illness  [name] is a 84 y.o. female with medical history significant of hypertension, A. fib not on anticoagulation presented to the emergency department after mechanical fall. Sustained R hip fx and underwent IM nail by Dr Rhona Raider.   Clinical Impression  Pt admitted with above diagnosis. Pt currently with functional limitations due to the deficits listed below (see PT Problem List). Pt mobilizing well, ambulated 66' with RW and min A.  Pt will benefit from skilled PT to increase their independence and safety with mobility to allow discharge to the venue listed below.       Follow Up Recommendations SNF;Supervision for mobility/OOB, pt working on getting help in her home, if she is able to find someone to stay with her at night and be with her intermittently for meals and bathing/ dressing, could go home with HHPT.    Equipment Recommendations  Rolling walker with 5" wheels    Recommendations for Other Services OT consult     Precautions / Restrictions Precautions Precautions: Fall Restrictions Weight Bearing Restrictions: Yes RLE Weight Bearing: Partial weight bearing RLE Partial Weight Bearing Percentage or Pounds: no percentage specified, left sticky note for MD and kept pt 25% for today      Mobility  Bed Mobility               General bed mobility comments: pt received in chair  Transfers Overall transfer level: Needs assistance Equipment used: Rolling walker (2 wheeled) Transfers: Sit to/from Stand Sit to Stand: Min guard         General transfer comment: vc's for hand placement, min-guard for safety  Ambulation/Gait Ambulation/Gait assistance: Min assist Gait Distance (Feet): 70 Feet Assistive device: Rolling walker (2 wheeled) Gait Pattern/deviations: Step-to pattern;Decreased weight shift to  right;Antalgic Gait velocity: decreased Gait velocity interpretation: <1.31 ft/sec, indicative of household ambulator General Gait Details: vc's for sequencing, pt with slow, antalgic gait  Stairs            Wheelchair Mobility    Modified Rankin (Stroke Patients Only)       Balance Overall balance assessment: Needs assistance;Mild deficits observed, not formally tested Sitting-balance support: No upper extremity supported Sitting balance-Leahy Scale: Good     Standing balance support: Single extremity supported Standing balance-Leahy Scale: Fair                               Pertinent Vitals/Pain Pain Assessment: Faces Faces Pain Scale: Hurts little more Pain Location: R hip Pain Descriptors / Indicators: Aching;Operative site guarding Pain Intervention(s): Limited activity within patient's tolerance;Monitored during session    [name] expects to be discharged to:: Private residence Living Arrangements: Alone Available Help at Discharge: Family;Available PRN/intermittently Type of Home: House Home Access: Stairs to enter Entrance Stairs-Rails: None Entrance Stairs-Number of Steps: 1 Home Layout: One level Home Equipment: None Additional Comments: pt's family may be able to stay with her but unsure at this time. Has a granddaughter that will be home for the summer in a week.     Prior Function Level of Independence: Independent         Comments: works out at [employer] daily, very active     Journalist, newspaper        Extremity/Trunk Assessment   Upper Extremity Assessment Upper Extremity Assessment: Overall [facility]  for tasks assessed    Lower Extremity Assessment Lower Extremity Assessment: RLE deficits/detail RLE Deficits / Details: hip flex 2+/5, knee ext 3/5, knee flex 3/5 RLE Sensation: WNL RLE Coordination: WNL    Cervical / Trunk Assessment Cervical / Trunk Assessment: Normal  Communication   Communication: No  difficulties  Cognition Arousal/Alertness: Awake/alert Behavior During Therapy: WFL for tasks assessed/performed Overall Cognitive Status: Within Functional Limits for tasks assessed                                        General Comments      Exercises Total Joint Exercises Ankle Circles/Pumps: AROM;Both;10 reps;Seated Quad Sets: AROM;Both;10 reps;Seated Gluteal Sets: AROM;Both;10 reps;Seated Heel Slides: AAROM;Right;10 reps;Seated Hip ABduction/ADduction: AAROM;Right;10 reps;Seated Straight Leg Raises: AAROM;Right;Seated;5 reps Long Arc Quad: AROM;Right;10 reps;Seated   Assessment/Plan    PT Assessment Patient needs continued PT services  PT Problem List Decreased strength;Decreased range of motion;Decreased activity tolerance;Decreased balance;Decreased mobility;Decreased knowledge of use of DME;Pain;Decreased knowledge of precautions       PT Treatment Interventions DME instruction;Gait training;Stair training;Functional mobility training;Therapeutic activities;Therapeutic exercise;Balance training;Patient/family education    PT Goals (Current goals can be found in the Care Plan section)  Acute Rehab PT Goals Patient Stated Goal: return home PT Goal Formulation: With patient Time For Goal Achievement: 01/30/18 Potential to Achieve Goals: Good    Frequency Min 4X/week   Barriers to discharge Decreased caregiver support lives alone    Co-evaluation               AM-PAC PT "6 Clicks" Daily Activity  Outcome Measure Difficulty turning over in bed (including adjusting bedclothes, sheets and blankets)?: A Little Difficulty moving from lying on back to sitting on the side of the bed? : A Little Difficulty sitting down on and standing up from a chair with arms (e.g., wheelchair, bedside commode, etc,.)?: A Little Help needed moving to and from a bed to chair (including a wheelchair)?: A Little Help needed walking in hospital room?: A Little Help  needed climbing 3-5 steps with a railing? : A Lot 6 Click Score: 17    End of Session Equipment Utilized During Treatment: Gait belt Activity Tolerance: Patient tolerated treatment well Patient left: in chair;with call bell/phone within reach;with family/visitor present Nurse Communication: Mobility status PT Visit Diagnosis: Unsteadiness on feet (R26.81);Difficulty in walking, not elsewhere classified (R26.2);Pain;History of falling (Z91.81) Pain - Right/Left: Right Pain - part of body: Hip    Time: 1040-1110 PT Time Calculation (min) (ACUTE ONLY): 30 min   Charges:   PT Evaluation $PT Eval Moderate Complexity: 1 Mod PT Treatments $Gait Training: 8-22 mins   PT G Codes:        [place]  [place] 01/16/2018, 1:46 PM

## 2018-01-16 NOTE — Progress Notes (Signed)
PROGRESS NOTE    [name]  ELF:[id number] DOB: 1930/01/24 DOA: 01/14/2018 PCP: Binnie Rail, MD     Brief Narrative:  [name] is a 84 y.o. female with medical history significant of hypertension, A. fib not on anticoagulation presented to the emergency department after mechanical fall.  Patient was leaving her house to go to chair and her heel got caught up in her doormat.  Patient fell on her right side.  She denies hitting her head, losing consciousness or having weakness.  Patient subsequently developed pain on her right hip and was unable to move her right lower extremity. Xray showed acute intra-trochanteric right femoral fracture. EDP consulted orthopedic surgery and recommended to admit to [facility] for surgery. She underwent ORIF 7/1.   New events last 24 hours / Subjective: Family with many complaints regarding food tray being late, SCDs on bed etc. Patient denies any dizziness, lightheadedness, worsening pain, CP, SOB this morning. She is sitting in bed.   Assessment & Plan:   Active Problems:   Femur fracture, right (HCC)   Hip fracture (HCC)   Right intertrochanteric femoral fracture S/p ORIF PT OT eval  Pain control   Acute blood loss anemia Baseline Hgb 12-13, post-operatively 7.0. No obvious blood loss source found, post-op leg is soft to touch. She has no complaints of any symptoms from anemia. Will transfuse 1u pRBC. If continues to drop or she becomes symptomatic, will investigate further with CT   Hypertension BP stable, continue metoprolol and Norvasc  Paroxysmal A. fib Rate well controlled, patient not on anticoagulation at home.   DVT prophylaxis: Lovenox Code Status: Full Family Communication: Son at bedside Disposition Plan: Pending PT OT eval    Consultants:   Orthopedic surgery   Procedures:   ORIF with trochanteric nail 7/1 Dr. Rhona Raider   Antimicrobials:  Anti-infectives (From admission, onward)   None        Objective: Vitals:   01/16/18 1042 01/16/18 1130 01/16/18 1200 01/16/18 1215  BP: (!) 142/76 (!) 95/51 95/71 (!) 103/44  Pulse: (!) 52 73 73 70  Resp:  18 18 18   Temp:  98.5 F (36.9 C) 98.5 F (36.9 C) 98.6 F (37 C)  TempSrc:  Oral Oral Oral  SpO2:  100%  100%  Weight:      Height:        Intake/Output Summary (Last 24 hours) at 01/16/2018 1251 Last data filed at 01/16/2018 1200 Gross per 24 hour  Intake 795 ml  Output -  Net 795 ml   Filed Weights   01/14/18 1110  Weight: 45.4 kg (100 lb)    Examination: General exam: Appears calm and comfortable  Respiratory system: Clear to auscultation. Respiratory effort normal. Cardiovascular system: S1 & S2 heard, RRR. No JVD, murmurs, rubs, gallops or clicks. No pedal edema. Gastrointestinal system: Abdomen is nondistended, soft and nontender. No organomegaly or masses felt. Normal bowel sounds heard. Central nervous system: Alert and oriented. No focal neurological deficits. Extremities: Right hip incision site is clean with dry dressing in place, leg is not firm or painful to touch, no significant bruising found  Skin: No rashes, lesions or ulcers Psychiatry: Judgement and insight appear normal. Mood & affect appropriate.    Data Reviewed: I have personally reviewed following labs and imaging studies  CBC: Recent Labs  Lab 01/14/18 1123 01/15/18 0420 01/16/18 0353 01/16/18 0746  WBC 9.1 8.8 6.5  --   NEUTROABS 7.5  --   --   --  HGB 12.5 9.5* 7.0* 7.0*  HCT 38.0 30.0* 21.8* 22.5*  MCV 97.9 98.4 99.5  --   PLT 285 235 157  --    Basic Metabolic Panel: Recent Labs  Lab 01/14/18 1123 01/15/18 0420 01/16/18 0353  NA 138 135 133*  K 4.2 4.3 4.4  CL 102 99 100  CO2 28 27 30   GLUCOSE 142* 136* 141*  BUN 19 16 24*  CREATININE 0.72 0.73 0.86  CALCIUM 9.6 8.7* 8.0*   GFR: Estimated Creatinine Clearance: 33.7 mL/min (by C-G formula based on SCr of 0.86 mg/dL). Liver Function Tests: No results for  input(s): AST, ALT, ALKPHOS, BILITOT, PROT, ALBUMIN in the last 168 hours. No results for input(s): LIPASE, AMYLASE in the last 168 hours. No results for input(s): AMMONIA in the last 168 hours. Coagulation Profile: Recent Labs  Lab 01/14/18 1123 01/15/18 0420  INR 0.96 1.06   Cardiac Enzymes: No results for input(s): CKTOTAL, CKMB, CKMBINDEX, TROPONINI in the last 168 hours. BNP (last 3 results) No results for input(s): PROBNP in the last 8760 hours. HbA1C: No results for input(s): HGBA1C in the last 72 hours. CBG: Recent Labs  Lab 01/15/18 1116  GLUCAP 201*   Lipid Profile: No results for input(s): CHOL, HDL, LDLCALC, TRIG, CHOLHDL, LDLDIRECT in the last 72 hours. Thyroid Function Tests: No results for input(s): TSH, T4TOTAL, FREET4, T3FREE, THYROIDAB in the last 72 hours. Anemia Panel: No results for input(s): VITAMINB12, FOLATE, FERRITIN, TIBC, IRON, RETICCTPCT in the last 72 hours. Sepsis Labs: No results for input(s): PROCALCITON, LATICACIDVEN in the last 168 hours.  Recent Results (from the past 240 hour(s))  Surgical pcr screen     Status: Abnormal   Collection Time: 01/14/18  6:19 PM  Result Value Ref Range Status   MRSA, PCR NEGATIVE NEGATIVE Final   Staphylococcus aureus POSITIVE (A) NEGATIVE Final    Comment: (NOTE) The Xpert SA Assay (FDA approved for NASAL specimens in patients 20 years of age and older), is one component of a comprehensive surveillance program. It is not intended to diagnose infection nor to guide or monitor treatment. Performed at [facility], [place] [address] [place], [place] [address]        Radiology Studies: Dg C-arm 1-60 Min  Result Date: 01/15/2018 CLINICAL DATA:  Right intratrochanteric fracture EXAM: RIGHT FEMUR 2 VIEWS; DG C-ARM 61-120 MIN COMPARISON:  01/14/2018 FLUOROSCOPY TIME:  Radiation Exposure Index (as provided by the fluoroscopic device): Not available If the device does not provide the exposure index:  Fluoroscopy Time:  2 minutes 28 seconds Number of Acquired Images:  For FINDINGS: Medullary rod is noted within the right femur. Fixation screws noted traversing the femoral neck. The fracture fragments are in near anatomic alignment. IMPRESSION: ORIF of proximal right femoral fracture. Electronically Signed   By: Inez Catalina M.D.   On: 01/15/2018 09:30   Dg Femur, Min 2 Views Right  Result Date: 01/15/2018 CLINICAL DATA:  Right intratrochanteric fracture EXAM: RIGHT FEMUR 2 VIEWS; DG C-ARM 61-120 MIN COMPARISON:  01/14/2018 FLUOROSCOPY TIME:  Radiation Exposure Index (as provided by the fluoroscopic device): Not available If the device does not provide the exposure index: Fluoroscopy Time:  2 minutes 28 seconds Number of Acquired Images:  For FINDINGS: Medullary rod is noted within the right femur. Fixation screws noted traversing the femoral neck. The fracture fragments are in near anatomic alignment. IMPRESSION: ORIF of proximal right femoral fracture. Electronically Signed   By: Inez Catalina M.D.   On:  01/15/2018 09:30      Scheduled Meds: . sodium chloride   Intravenous Once  . amLODipine  5 mg Oral Daily  . aspirin EC  325 mg Oral BID AC & HS  . docusate sodium  100 mg Oral BID  . metoprolol tartrate  25 mg Oral BID   Continuous Infusions:   LOS: 2 days    Time spent: 25 minutes   Dessa Phi, DO [facility] [email or web address] Password [facility] 01/16/2018, 12:51 PM

## 2018-01-16 NOTE — NC FL2 (Signed)
[place] LEVEL OF CARE SCREENING TOOL     IDENTIFICATION  Patient Name: [name] Birthdate: April 18, 1930 Sex: female Admission Date (Current Location): 01/14/2018  [facility] and [place] Number:  Herbalist and Address:  [facility], [place] [address], [place], [place] [address]      Provider Number: [id number]  Attending Physician Name and Address:  Dessa Phi, DO  Relative Name and Phone Number:  Stavroula Rohde, son, [phone]    Current Level of Care: Hospital Recommended Level of Care: [place] Prior Approval Number:    Date Approved/Denied:   PASRR Number: [id number]  Discharge Plan: SNF    Current Diagnoses: Patient Active Problem List   Diagnosis Date Noted  . Femur fracture, right ([place]) 01/14/2018  . Hip fracture ([place]) 01/14/2018  . Dysuria 10/12/2017  . Macular degeneration (senile) of retina 06/14/2010  . Vitamin D deficiency 03/10/2009  . Osteoporosis 03/10/2009  . NEOPLASM, MALIGNANT, BLADDER, HX OF 03/10/2009  . 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  . Hyperglycemia 06/26/2008  . Essential hypertension 08/09/2007  . ATRIAL FIBRILLATION WITH RAPID VENTRICULAR RESPONSE 08/09/2007    Orientation RESPIRATION BLADDER Height & Weight     Self, Time, Situation, Place  Normal Continent, External catheter Weight: 100 lb (45.4 kg) Height:  5\' 4"  (162.6 cm)  BEHAVIORAL SYMPTOMS/MOOD NEUROLOGICAL BOWEL NUTRITION STATUS      Continent Diet(please see DC summary)  AMBULATORY STATUS COMMUNICATION OF NEEDS Skin   Limited Assist Verbally Surgical wounds(closed incision R hip)                       Personal Care Assistance Level of Assistance  Bathing, Feeding, Dressing Bathing Assistance: Limited assistance Feeding assistance: Independent Dressing Assistance: Limited assistance     Functional Limitations Info  Sight, Hearing, Speech Sight Info: Adequate Hearing Info: Adequate Speech  Info: Adequate    SPECIAL CARE FACTORS FREQUENCY  PT (By licensed PT)     PT Frequency: 5x/week              Contractures Contractures Info: Not present    Additional Factors Info  Code Status, Allergies Code Status Info: Full Allergies Info: No Known Allergies           Current Medications (01/16/2018):  This is the current hospital active medication list Current Facility-Administered Medications  Medication Dose Route Frequency Provider Last Rate Last Dose  . 0.9 %  sodium chloride infusion (Manually program via Guardrails IV Fluids)   Intravenous Once Dessa Phi, DO      . acetaminophen (TYLENOL) tablet 325-650 mg  325-650 mg Oral Q6H PRN Loni Dolly, PA-C      . amLODipine (NORVASC) tablet 5 mg  5 mg Oral Daily Loni Dolly, PA-C   5 mg at 01/16/18 1042  . aspirin EC tablet 325 mg  325 mg Oral BID AC & HS Loni Dolly, PA-C   325 mg at 01/16/18 0800  . docusate sodium (COLACE) capsule 100 mg  100 mg Oral BID Loni Dolly, PA-C   100 mg at 01/16/18 1043  . HYDROcodone-acetaminophen (NORCO) 7.5-325 MG per tablet 1-2 tablet  1-2 tablet Oral Q4H PRN Loni Dolly, PA-C   1 tablet at 01/15/18 1218  . HYDROcodone-acetaminophen (NORCO/VICODIN) 5-325 MG per tablet 1-2 tablet  1-2 tablet Oral Q4H PRN Loni Dolly, PA-C   1 tablet at 01/16/18 0018  . menthol-cetylpyridinium (CEPACOL) lozenge 3 mg  1 lozenge Oral PRN Loni Dolly, PA-C  Or  . phenol (CHLORASEPTIC) mouth spray 1 spray  1 spray Mouth/Throat PRN Loni Dolly, PA-C      . metoCLOPramide (REGLAN) tablet 5 mg  5 mg Oral Q8H PRN Loni Dolly, PA-C       Or  . metoCLOPramide (REGLAN) injection 5 mg  5 mg Intravenous Q8H PRN Loni Dolly, PA-C      . metoprolol tartrate (LOPRESSOR) tablet 25 mg  25 mg Oral BID Loni Dolly, PA-C   25 mg at 01/16/18 1042  . morphine 2 MG/ML injection 0.5-1 mg  0.5-1 mg Intravenous Q2H PRN Loni Dolly, PA-C      . ondansetron [facility]) tablet 4 mg  4 mg Oral Q6H PRN Loni Dolly, PA-C        Or  . ondansetron [facility]) injection 4 mg  4 mg Intravenous Q6H PRN Loni Dolly, PA-C   4 mg at 01/15/18 6754     Discharge Medications: Please see discharge summary for a list of discharge medications.  Relevant Imaging Results:  Relevant Lab Results:   Additional Information SSN: [id number]  Estanislado Emms, LCSW

## 2018-01-17 DIAGNOSIS — S7291XA Unspecified fracture of right femur, initial encounter for closed fracture: Secondary | ICD-10-CM

## 2018-01-17 LAB — CBC
HCT: 28.2 % — ABNORMAL LOW (ref 36.0–46.0)
Hemoglobin: 9.2 g/dL — ABNORMAL LOW (ref 12.0–15.0)
MCH: 31.8 pg (ref 26.0–34.0)
MCHC: 32.6 g/dL (ref 30.0–36.0)
MCV: 97.6 fL (ref 78.0–100.0)
PLATELETS: 172 10*3/uL (ref 150–400)
RBC: 2.89 MIL/uL — AB (ref 3.87–5.11)
RDW: 13.2 % (ref 11.5–15.5)
WBC: 7.3 10*3/uL (ref 4.0–10.5)

## 2018-01-17 LAB — BASIC METABOLIC PANEL
Anion gap: 5 (ref 5–15)
BUN: 13 mg/dL (ref 8–23)
CALCIUM: 8 mg/dL — AB (ref 8.9–10.3)
CO2: 28 mmol/L (ref 22–32)
Chloride: 101 mmol/L (ref 98–111)
Creatinine, Ser: 0.66 mg/dL (ref 0.44–1.00)
GFR calc non Af Amer: >60 mL/min (ref >60)
Glucose, Bld: 125 mg/dL — ABNORMAL HIGH (ref 70–99)
Potassium: 4.3 mmol/L (ref 3.5–5.1)
SODIUM: 134 mmol/L — AB (ref 135–145)

## 2018-01-17 LAB — BPAM RBC
BLOOD PRODUCT EXPIRATION DATE: 201907262359
ISSUE DATE / TIME: 201907021153
Unit Type and Rh: 6200

## 2018-01-17 LAB — TYPE AND SCREEN
ABO/RH(D): A POS
Antibody Screen: NEGATIVE
Unit division: 0

## 2018-01-17 MED ORDER — METOPROLOL TARTRATE 25 MG PO TABS: 25.0000 mg | ORAL_TABLET | Freq: Two times a day (BID) | ORAL | 1 refills | Status: DC

## 2018-01-17 MED ORDER — SENNOSIDES-DOCUSATE SODIUM 8.6-50 MG PO TABS: 2.0000 | ORAL_TABLET | Freq: Every day | ORAL | 1 refills | Status: DC

## 2018-01-17 MED ORDER — ASPIRIN 325 MG PO TBEC: 325.0000 mg | DELAYED_RELEASE_TABLET | Freq: Two times a day (BID) | ORAL | 0 refills | Status: DC

## 2018-01-17 MED ORDER — ONDANSETRON 4 MG PO TBDP: 4.0000 mg | ORAL_TABLET | Freq: Three times a day (TID) | ORAL | 0 refills | Status: DC | PRN

## 2018-01-17 MED ORDER — HYDROCODONE-ACETAMINOPHEN 5-325 MG PO TABS: 1.0000 | ORAL_TABLET | Freq: Four times a day (QID) | ORAL | 0 refills | Status: DC | PRN

## 2018-01-17 MED ORDER — PANTOPRAZOLE SODIUM 40 MG PO TBEC: 40.0000 mg | DELAYED_RELEASE_TABLET | Freq: Every day | ORAL | 2 refills | Status: DC

## 2018-01-17 MED ORDER — AMLODIPINE BESYLATE 5 MG PO TABS: ORAL_TABLET | ORAL | 1 refills | Status: DC

## 2018-01-17 NOTE — Care Management Important Message (Signed)
Important Message  Patient Details  Name: [name] MRN: [id number] Date of Birth: 1930/01/19   Medicare Important Message Given:  Yes    [name] 01/17/2018, 3:02 PM

## 2018-01-17 NOTE — Progress Notes (Signed)
Occupational Therapy Evaluation Patient Details Name: [name] MRN: [id number] DOB: Feb 10, 1930 Today's Date: 01/17/2018    History of Present Illness [name] is a 84 y.o. female with medical history significant of hypertension, A. fib not on anticoagulation presented to the emergency department after mechanical fall. Sustained R hip fx and underwent IM nail by [name].  PMH: afib with RVR, hypertension, macular degeneration.     Clinical Impression   PTA patient reports independent with ADL, IADL, mobility, even attending gym daily.  Patient currently requires supervision for UB ADL, min assist for LB ADL, toileting and toilet transfers with supervision, and grooming standing with supervision. Required reinforcement of PWB to R LE throughout session. She is currently limited by decreased activity tolerance, impaired balance, and PWB R LE.  She plans to initially complete basin baths, but agreeable to recommendations of having HHOT assist with shower transfers.  She will benefit from continued OT services while admitted and after DC in order to maximize independence for participation in ADL and IADL. She reports her son plans on staying with her to provide 24/7 supervision.  Recommend follow up with HHOT and use of 3:1 bedside commode over commode and as shower chair.  Will continue to follow while admitted.    Follow Up Recommendations  Home health OT;Supervision/Assistance - 24 hour    Equipment Recommendations  3 in 1 bedside commode    Recommendations for Other Services       Precautions / Restrictions Precautions Precautions: Fall Restrictions Weight Bearing Restrictions: Yes RLE Weight Bearing: Partial weight bearing RLE Partial Weight Bearing Percentage or Pounds: no percentage specified, left sticky note for MD and kept pt 25% for today      Mobility Bed Mobility Overal bed mobility: Needs Assistance Bed Mobility: Supine to Sit;Sit to Supine     Supine to  sit: Supervision     General bed mobility comments: seated in recliner upon entry  Transfers Overall transfer level: Needs assistance Equipment used: Rolling walker (2 wheeled) Transfers: Sit to/from Stand Sit to Stand: Supervision         General transfer comment: vc's for hand placement, no physical assistance needed to stand.      Balance Overall balance assessment: Needs assistance Sitting-balance support: No upper extremity supported Sitting balance-Leahy Scale: Good     Standing balance support: No upper extremity supported;During functional activity Standing balance-Leahy Scale: Fair                             ADL either performed or assessed with clinical judgement   ADL Overall ADL's : Needs assistance/impaired     Grooming: Supervision/safety;Standing(RW) Grooming Details (indicate cue type and reason): foward leaning on elbows for support washing hands Upper Body Bathing: Supervision/ safety;Sitting   Lower Body Bathing: Minimal assistance;Sit to/from stand;Cueing for compensatory techniques Lower Body Bathing Details (indicate cue type and reason): educated on having assistance with bathing, completing seated for safety; patient reports plan for basin baths initally  Upper Body Dressing : Supervision/safety;Sitting   Lower Body Dressing: Min guard;Sit to/from stand;Cueing for compensatory techniques Lower Body Dressing Details (indicate cue type and reason): min guard in standing for safety and balance with 0-1 hand support Toilet Transfer: Supervision/safety;Ambulation;BSC;RW Toilet Transfer Details (indicate cue type and reason): edu on placement of 3:1 over commode at home, cueing for hand placement and safety  Toileting- Clothing Manipulation and Hygiene: Supervision/safety;Sit to/from stand Electrical engineer  Details (indicate cue type and reason): for safety and balance Tub/ Shower Transfer: Walk-in shower;Minimal  assistance;Cueing for safety;Cueing for sequencing;Ambulation;Rolling walker Tub/Shower Transfer Details (indicate cue type and reason): simulated, poor clearance over threshold with maintaining PWB status; recommended follow up with HHOT  Functional mobility during ADLs: Supervision/safety;Min guard;Rolling walker(minguard initally fading to SPV )       Vision   Vision Assessment?: No apparent visual deficits     Perception     Praxis      Pertinent Vitals/Pain Pain Assessment: Faces Pain Score: 4  Faces Pain Scale: Hurts little more Pain Location: R hip Pain Descriptors / Indicators: Aching;Operative site guarding Pain Intervention(s): Limited activity within patient's tolerance;Monitored during session     Hand Dominance     Extremity/Trunk Assessment Upper Extremity Assessment Upper Extremity Assessment: Overall WFL for tasks assessed   Lower Extremity Assessment Lower Extremity Assessment: Defer to PT evaluation RLE Deficits / Details: s/p L IM nail R hip       Communication Communication Communication: No difficulties   Cognition Arousal/Alertness: Awake/alert Behavior During Therapy: WFL for tasks assessed/performed Overall Cognitive Status: Within Functional Limits for tasks assessed                                     General Comments          Shoulder Instructions      Home Living Family/patient expects to be discharged to:: Private residence Living Arrangements: Alone Available Help at Discharge: Family;Available 24 hours/day(son plans to stay with patient) Type of Home: House Home Access: Stairs to enter [employer] of Steps: 1 Entrance Stairs-Rails: None Home Layout: One level     Bathroom Shower/Tub: Occupational psychologist: Standard     Home Equipment: None   Additional Comments: patient reports son plans to stay with her, then possibility of granddaughter assisting as well      Prior  Functioning/Environment Level of Independence: Independent        Comments: very active, completes ADL, IADL and mobility independently        OT Problem List: Decreased activity tolerance;Impaired balance (sitting and/or standing);Decreased safety awareness;Pain;Decreased knowledge of precautions      OT Treatment/Interventions: Self-care/ADL training;Therapeutic exercise;Energy conservation;DME and/or AE instruction;Therapeutic activities;Patient/family education;Balance training    OT Goals(Current goals can be found in the care plan section) Acute Rehab OT Goals Patient Stated Goal: return home OT Goal Formulation: With patient Time For Goal Achievement: 01/31/18 Potential to Achieve Goals: Good  OT Frequency: Min 2X/week   Barriers to D/C:            Co-evaluation              AM-PAC PT "6 Clicks" Daily Activity     Outcome Measure Help from another person eating meals?: None Help from another person taking care of personal grooming?: A Little Help from another person toileting, which includes using toliet, bedpan, or urinal?: A Little Help from another person bathing (including washing, rinsing, drying)?: A Little Help from another person to put on and taking off regular upper body clothing?: None Help from another person to put on and taking off regular lower body clothing?: A Little 6 Click Score: 20   End of Session Equipment Utilized During Treatment: Gait belt;Rolling walker  Activity Tolerance: Patient tolerated treatment well Patient left: in chair;with call bell/phone within reach  OT Visit Diagnosis: Other  abnormalities of gait and mobility (R26.89);Pain Pain - Right/Left: Right Pain - part of body: Hip                Time: 1340-1402 OT Time Calculation (min): 22 min Charges:  OT General Charges $OT Visit: 1 Visit OT Evaluation $OT Eval Moderate Complexity: 1 Mod G-Codes:     Delight Stare, OTR/L  Pager [phone]   Delight Stare 01/17/2018, 3:15 PM

## 2018-01-17 NOTE — Discharge Instructions (Signed)
1)Avoid ibuprofen/Advil/Aleve/Motrin/Goody Powders/Naproxen/BC powders/Meloxicam/Diclofenac/Indomethacin and other Nonsteroidal anti-inflammatory medications as these will make you more likely to bleed and can cause stomach ulcers, can also cause Kidney problems.   2) take medications as prescribed  3)Activity limitation/restrictions as advised by orthopedic surgeon  4) the orthopedic surgeon as advised that you take aspirin 325 mg food 1 tab twice a day for the next 4 weeks to reduce the risk for blood clots in the legs  5)You have Paroxysmal Atrial Fibrillation as discussed this puts you at higher risk for stroke, Talk to your regular doctor about possibly going on a blood thinner to reduce your risk for stroke after you have completed 4 weeks of aspirin twice a day as advised by the orthopedic surgeon .  No need for additional blood thinners at this time until You complete the aspirin 325 twice a day for 4 weeks advised by the orthopedic surgeon

## 2018-01-17 NOTE — Progress Notes (Addendum)
Physical Therapy Treatment Patient Details Name: [name] MRN: [id number] DOB: Feb 10, 1930 Today's Date: 01/17/2018    History of Present Illness [name] is a 84 y.o. female with medical history significant of hypertension, A. fib not on anticoagulation presented to the emergency department after mechanical fall. Sustained R hip fx and underwent IM nail by Dr Rhona Raider.  PMH: afib with RVR, hypertension, macular degeneration.      PT Comments    Pt performed gait and exercises during session this am.  She is progressing quite well and her son reports that he can stay with her for a week after d/c as he is retired.  Pt will continue to benefit from skilled PT in a home setting.  Will plan for stair training next session.     Follow Up Recommendations  Home health PT;Supervision - Intermittent     Equipment Recommendations  Rolling walker with 5" wheels;3in1 (PT)    Recommendations for Other Services       Precautions / Restrictions Precautions Precautions: Fall Restrictions Weight Bearing Restrictions: Yes RLE Weight Bearing: Partial weight bearing RLE Partial Weight Bearing Percentage or Pounds: no percentage specified, left sticky note for MD and kept pt 25% for today    Mobility  Bed Mobility Overal bed mobility: Needs Assistance Bed Mobility: Supine to Sit;Sit to Supine     Supine to sit: Supervision     General bed mobility comments: Pt required cues for sequencing but no physical assistance needed.    Transfers Overall transfer level: Needs assistance Equipment used: Rolling walker (2 wheeled) Transfers: Sit to/from Stand Sit to Stand: Supervision         General transfer comment: vc's for hand placement, no physical assistance needed to stand.    Ambulation/Gait Ambulation/Gait assistance: Min assist Gait Distance (Feet): 120 Feet Assistive device: Rolling walker (2 wheeled) Gait Pattern/deviations: Step-to pattern;Decreased weight shift to  right;Antalgic;Trunk flexed Gait velocity: decreased   General Gait Details: VCs for upper trunk control and forward gaze, cues for UE use to maintain PWB.  Pt tolerated well.     Stairs             Wheelchair Mobility    Modified Rankin (Stroke Patients Only)       Balance Overall balance assessment: Needs assistance;Mild deficits observed, not formally tested Sitting-balance support: No upper extremity supported Sitting balance-Leahy Scale: Good     Standing balance support: Single extremity supported Standing balance-Leahy Scale: Fair                              Cognition Arousal/Alertness: Awake/alert Behavior During Therapy: WFL for tasks assessed/performed Overall Cognitive Status: Within Functional Limits for tasks assessed                                        Exercises Total Joint Exercises Ankle Circles/Pumps: AROM;Both;10 reps;Seated Quad Sets: AROM;10 reps;Right;Supine Short Arc Quad: AROM;Right;10 reps;Supine Heel Slides: AROM;Right;10 reps;Supine Hip ABduction/ADduction: AAROM;Right;10 reps;Supine    General Comments        Pertinent Vitals/Pain Pain Assessment: 0-10 Pain Score: 4  Pain Location: R hip Pain Descriptors / Indicators: Aching;Operative site guarding Pain Intervention(s): Monitored during session;Repositioned    Home Living                      Prior  Function            PT Goals (current goals can now be found in the care plan section) Acute Rehab PT Goals Patient Stated Goal: return home Potential to Achieve Goals: Good Progress towards PT goals: Progressing toward goals    Frequency    Min 4X/week      PT Plan Discharge plan needs to be updated    Co-evaluation              AM-PAC PT "6 Clicks" Daily Activity  Outcome Measure  Difficulty turning over in bed (including adjusting bedclothes, sheets and blankets)?: A Little Difficulty moving from lying on back to  sitting on the side of the bed? : A Little Difficulty sitting down on and standing up from a chair with arms (e.g., wheelchair, bedside commode, etc,.)?: A Little Help needed moving to and from a bed to chair (including a wheelchair)?: A Little Help needed walking in hospital room?: A Little Help needed climbing 3-5 steps with a railing? : A Lot 6 Click Score: 17    End of Session Equipment Utilized During Treatment: Gait belt Activity Tolerance: Patient tolerated treatment well Patient left: in chair;with call bell/phone within reach;with family/visitor present Nurse Communication: Mobility status PT Visit Diagnosis: Unsteadiness on feet (R26.81);Difficulty in walking, not elsewhere classified (R26.2);Pain;History of falling (Z91.81) Pain - Right/Left: Right Pain - part of body: Hip     Time: 8206-0156 PT Time Calculation (min) (ACUTE ONLY): 33 min  Charges:  $Gait Training: 8-22 mins $Therapeutic Exercise: 8-22 mins                    G Codes:       Governor Rooks, PTA pager [phone]    Cristela Blue 01/17/2018, 12:11 PM

## 2018-01-17 NOTE — Progress Notes (Signed)
Subjective: 2 Days Post-Op Procedure(s) (LRB): INTRAMEDULLARY (IM) NAIL INTERTROCHANTRIC (Right)   Patient doing well. Sitting up in bed eating breakfast.  Activity level:  50% partial weightbearing right leg Diet tolerance:  ok Voiding:  ok Patient reports pain as mild.    Objective: Vital signs in last 24 hours: Temp:  [98.4 F (36.9 C)-99.2 F (37.3 C)] 98.4 F (36.9 C) (07/03 0440) Pulse Rate:  [52-88] 72 (07/03 0440) Resp:  [14-18] 16 (07/03 0440) BP: (95-142)/(44-76) 122/55 (07/03 0440) SpO2:  [96 %-100 %] 96 % (07/03 0440)  Labs: Recent Labs    01/14/18 1123 01/15/18 0420 01/16/18 0353 01/16/18 0746 01/17/18 0529  HGB 12.5 9.5* 7.0* 7.0* 9.2*   Recent Labs    01/16/18 0353 01/16/18 0746 01/17/18 0529  WBC 6.5  --  7.3  RBC 2.19*  --  2.89*  HCT 21.8* 22.5* 28.2*  PLT 157  --  172   Recent Labs    01/16/18 0353 01/17/18 0529  NA 133* 134*  K 4.4 4.3  CL 100 101  CO2 30 28  BUN 24* 13  CREATININE 0.86 0.66  GLUCOSE 141* 125*  CALCIUM 8.0* 8.0*   Recent Labs    01/14/18 1123 01/15/18 0420  INR 0.96 1.06    Physical Exam:  Neurologically intact ABD soft Neurovascular intact Sensation intact distally Intact pulses distally Dorsiflexion/Plantar flexion intact Incision: dressing C/D/I and no drainage No cellulitis present Compartment soft  Assessment/Plan:  2 Days Post-Op Procedure(s) (LRB): INTRAMEDULLARY (IM) NAIL INTERTROCHANTRIC (Right) Advance diet Up with therapy Discharge to SNF likely based on medicine and PT recommendations. From an orthopaedic standpoint she is good to go today if cleared by Medicine and PT.  We greatly appreciate medical management. Follow up in office 2 weeks post op.  [name], [name] 01/17/2018, 7:37 AM

## 2018-01-17 NOTE — Progress Notes (Signed)
Written discharge and verbal instructions were given to the patient and her son.  They both verbalize understanding. Patient has a follow up plan with [facility].  Patient will be discharged to home with her son.

## 2018-01-17 NOTE — Progress Notes (Signed)
Dr Maurene Capes was sent a text regarding the patient deciding to take a blood thinner as suggested this morning per MD.

## 2018-01-17 NOTE — Discharge Summary (Signed)
[name], is a 84 y.o. female  DOB 1930/07/04  MRN [id number].  Admission date:  01/14/2018  Admitting Physician  Doreatha Lew, MD  Discharge Date:  01/17/2018   Primary MD  Binnie Rail, MD  Recommendations for primary care physician for things to follow:   1)Avoid ibuprofen/Advil/Aleve/Motrin/Goody Powders/Naproxen/BC powders/Meloxicam/Diclofenac/Indomethacin and other Nonsteroidal anti-inflammatory medications as these will make you more likely to bleed and can cause stomach ulcers, can also cause Kidney problems.   2)Take medications as prescribed  3)Activity limitation/restrictions as advised by orthopedic surgeon  4) the orthopedic surgeon as advised that you take aspirin 325 mg food 1 tab twice a day for the next 4 weeks to reduce the risk for blood clots in the legs  5)You have Paroxysmal Atrial Fibrillation as discussed this puts you at higher risk for stroke, Talk to your regular doctor about possibly going on a blood thinner to reduce your risk for stroke after you have completed 4 weeks of aspirin twice a day as advised by the orthopedic surgeon .  No need for additional blood thinners at this time until You complete the aspirin 325 twice a day for 4 weeks advised by the orthopedic surgeon   Admission Diagnosis  Fall, initial encounter [W19.XXXA] Closed displaced intertrochanteric fracture of right femur, initial encounter ([place]) [S72.141A] Femur fracture, right ([place]) [S72.91XA]   Discharge Diagnosis  Fall, initial encounter [W19.XXXA] Closed displaced intertrochanteric fracture of right femur, initial encounter ([place]) [S72.141A] Femur fracture, right ([place]) [S72.91XA]    Active Problems:   Femur fracture, right ([place])   Hip fracture ([place])      Past Medical History:  Diagnosis Date  . Anemia   . Arthritis    "back" (01/16/2018)  . Atrial fibrillation ([place])   . Cancer [facility])      low grade papillary, most recent recurrence 2011,  Dr Karsten Ro  . 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 History of blood transfusion 01/15/2018   S/P OR  . Hypertension   . Hyponatremia 01/26/12   Na+ 131  . Macular degeneration, bilateral    "were wet; dry now" (01/16/2018)  . Other abnormal glucose 06/14/12   Fasting blood glucose 128; A1c 5.8% on 12/15/11    Past Surgical History:  Procedure Laterality Date  . ABDOMINAL HYSTERECTOMY  1970s   with oopherectomy for incidenctal cyst (no PMH of abnormal PAP)  . APPENDECTOMY     1970s  . BLADDER TUMOR EXCISION  2011 & 2013   Dr Karsten Ro  . CATARACT EXTRACTION, BILATERAL  2014   Dr Herbert Deaner  . COLONOSCOPY     negative; Dr Olevia Perches  . CYSTOSCOPY      multiple since tumor excision;Dr Ottelin  . DILATION AND CURETTAGE OF UTERUS    . FRACTURE SURGERY    . INTRAMEDULLARY (IM) NAIL INTERTROCHANTERIC Right 01/15/2018   Procedure: INTRAMEDULLARY (IM) NAIL INTERTROCHANTRIC;  Surgeon: Melrose Nakayama, MD;  Location: [facility];  Service: Orthopedics;  Laterality: Right;  . TONSILLECTOMY  HPI  from the history and physical done on the day of admission:    Chief Complaint: Fall   HPI: [name] is a 84 y.o. female with medical history significant of hypertension, A. fib not on anticoagulation presented to the emergency department after mechanical fall.  Patient was leaving her house to go to chair and her heel got caught up in her doormat.  Patient fell on her right side.  She denies hitting her head, losing consciousness or having weakness.  Patient subsequently developed pain on her right hip and was unable to move her right lower extremity.  ED Course: Lab work-up unremarkable, Xray shows acute intra-trochanteric right femoral fracture. EDP consulted orthopedic surgery and recommended to admit to [facility] for surgery in AM.     Hospital Course:    Brief Narrative:  [name] is a 84 y.o.femalewith  medical history significant ofhypertension, A. fib not on anticoagulation presented to the emergency department after mechanical fall. Patient was leaving her house to go to chair and herheel got caught up in her doormat. Patient fell on her right side.She denies hitting her head, losing consciousness or having weakness. Patient subsequently developed pain on her right hip and was unable to move her right lower extremity. Xray showed acute intra-trochanteric right femoral fracture. EDP consulted orthopedic surgery and recommended to admit to [facility] hospital for surgery. She underwent ORIF 7/1.   Plan:- Right intertrochanteric femoral fracture- S/p ORIF on 01/15/18, physical therapy evaluation appreciated, patient and son declined transfer to skilled nursing facility for rehab, they have elected to go home with home health services.  Patient understands risk of falls.  Orthopedic surgeon advised to use aspirin 325 mg 1 p.o. twice daily for DVT prophylaxis for 4 weeks  Acute blood loss anemia- Baseline Hgb 12-13, post-operatively 7.0.,  Suspected secondary to right hip surgery, hemoglobin is up to 9.2 posttransfusion of 1 unit of packed cells, post-op leg is soft to touch.    Hypertension- BP stable, continue metoprolol and Norvasc  Paroxysmal A. Fib- Rate well controlled on metoprolol, patient not on anticoagulation at home.  CHA2DS2- VASc score is 4 (HTN, Female gender and age > 4), She will Talk  To her PCP about possibly going on a blood thinner to reduce risk for stroke after she has completed 4 weeks of aspirin twice a day as advised by the orthopedic surgeon .  No need for additional blood thinners at this time until she completes the aspirin 325 twice a day for 4 weeks advised by the orthopedic surgeon   Code Status: Full Family Communication: Son at bedside Disposition Plan:  declines SNF, transfer home with [facility]   Consultants:   Orthopedic surgery   Procedures:   ORIF with  trochanteric nail 7/1 Dr. Rhona Raider    Discharge Condition: stable  Follow UP  Follow-up Information    Melrose Nakayama, MD. Schedule an appointment as soon as possible for a visit in 2 weeks.   Specialty:  Orthopedic Surgery Contact information: [place] [address] [place], [facility] Follow up.   Specialty:  [place] Why:   A representative from [facility] will contact you to arrange start date and time for your therapy.   Contact information: [place] [address] [phone]           Diet and Activity recommendation:  As advised  Discharge Instructions  Discharge Instructions    Call MD for:  difficulty breathing, headache or visual disturbances   Complete by:  As directed    Call MD for:  persistant dizziness or light-headedness   Complete by:  As directed    Call MD for:  persistant nausea and vomiting   Complete by:  As directed    Call MD for:  redness, tenderness, or signs of infection (pain, swelling, redness, odor or green/yellow discharge around incision site)   Complete by:  As directed    Call MD for:  severe uncontrolled pain   Complete by:  As directed    Call MD for:  temperature >100.4   Complete by:  As directed    Diet - low sodium heart healthy   Complete by:  As directed    Discharge instructions   Complete by:  As directed    1)Avoid ibuprofen/Advil/Aleve/Motrin/Goody Powders/Naproxen/BC powders/Meloxicam/Diclofenac/Indomethacin and other Nonsteroidal anti-inflammatory medications as these will make you more likely to bleed and can cause stomach ulcers, can also cause Kidney problems.   2) take medications as prescribed  3)Activity limitation/restrictions as advised by orthopedic surgeon  4) the orthopedic surgeon as advised that you take aspirin 325 mg food 1 tab twice a day for the next 4 weeks to reduce the risk for blood clots in the  legs  5)You have Paroxysmal Atrial Fibrillation as discussed this puts you at higher risk for stroke, Talk to your regular doctor about possibly going on a blood thinner to reduce your risk for stroke after you have completed 4 weeks of aspirin twice a day as advised by the orthopedic surgeon .  No need for additional blood thinners at this time until You complete the aspirin 325 twice a day for 4 weeks advised by the orthopedic surgeon   Increase activity slowly   Complete by:  As directed    Activity restrictions/limitations as advised by orthopedic surgeon   Partial weight bearing   Complete by:  As directed    % Body Weight:  50%   Laterality:  right   Extremity:  Lower        Discharge Medications     Allergies as of 01/17/2018   No Known Allergies     Medication List    STOP taking these medications   aspirin 81 MG tablet Replaced by:  aspirin 325 MG EC tablet     TAKE these medications   amLODipine 5 MG tablet Commonly known as:  NORVASC 1 tablet daily for blood pressure What changed:    how much to take  how to take this  when to take this  additional instructions   aspirin 325 MG EC tablet Take 1 tablet (325 mg total) by mouth 2 (two) times daily at 8 am and 10 pm. Replaces:  aspirin 81 MG tablet   BILBERRY PO Take by mouth daily.   Calcium Carbonate 500 MG Chew Chew 1,000 mg by mouth daily.   Fish Oil 1000 MG Caps Take 1 capsule by mouth daily.   HYDROcodone-acetaminophen 5-325 MG tablet Commonly known as:  NORCO/VICODIN Take 1-2 tablets by mouth every 6 (six) hours as needed for moderate pain or severe pain.   LUTEIN 20 PO Take 20 mg by mouth daily.   magnesium gluconate 500 MG tablet Commonly known as:  MAGONATE Take 500 mg by mouth daily. Reported on 12/16/2015   metoprolol tartrate 25 MG tablet Commonly known as:  LOPRESSOR Take 1 tablet (25 mg total) by  mouth 2 (two) times daily. What changed:  additional instructions    multivitamin-lutein Caps capsule Take 1 capsule by mouth daily.   VISION-VITE PRESERVE PO Take 1 tablet by mouth daily.   ondansetron 4 MG disintegrating tablet Commonly known as:  ZOFRAN ODT Take 1 tablet (4 mg total) by mouth every 8 (eight) hours as needed for nausea or vomiting.   pantoprazole 40 MG tablet Commonly known as:  PROTONIX Take 1 tablet (40 mg total) by mouth daily.   senna-docusate 8.6-50 MG tablet Commonly known as:  Senokot-S Take 2 tablets by mouth at bedtime.   tretinoin 0.05 % cream Commonly known as:  RETIN-A Apply 1 application topically at bedtime.            Durable Medical Equipment  (From admission, onward)        Start     Ordered   01/17/18 1316  For home use only DME Walker rolling  Once    Question:  Patient needs a walker to treat with the following condition  Answer:  Hip fracture ([place])   01/17/18 1317   01/17/18 1315  For home use only DME 3 n 1  Once     01/17/18 1317       Discharge Care Instructions  (From admission, onward)        Start     Ordered   01/17/18 0000  Partial weight bearing    Question Answer Comment  % Body Weight 50%   Laterality right   Extremity Lower      01/17/18 0740      Major procedures and Radiology Reports - PLEASE review detailed and final reports for all details, in brief -    Dg Chest 2 View  Result Date: 01/14/2018 CLINICAL DATA:  Fall. EXAM: CHEST - 2 VIEW COMPARISON:  October 06, 2008 FINDINGS: No pneumothorax. The heart, hila, mediastinum, lungs, and pleura are normal. No other acute abnormalities. IMPRESSION: No acute abnormalities. Electronically Signed   By: Dorise Bullion III M.D   On: 01/14/2018 13:02   Dg Pelvis 1-2 Views  Result Date: 01/14/2018 CLINICAL DATA:  Pt was wearing heels and fell onto concrete. NO head neck or back pain. No LOC, no blood thinners. Pulse motor sensation in right foot, no bruising or redness. Pain only when she moves to right thigh. Pt HX: non  smoker, HTN EXAM: PELVIS - 1-2 VIEW COMPARISON:  CT 09/26/2008 FINDINGS: Mildly comminuted intratrochanteric fracture of the right femur, with fragments distracted at least 1.4 cm. No dislocation. Bony pelvis appears intact. Left hip unremarkable. Bilateral pelvic phleboliths. Degenerative disc disease in the lower lumbar spine. IMPRESSION: 1. Distracted right intertrochanteric femur fracture. Electronically Signed   By: Lucrezia Europe M.D.   On: 01/14/2018 13:05   Dg C-arm 1-60 Min  Result Date: 01/15/2018 CLINICAL DATA:  Right intratrochanteric fracture EXAM: RIGHT FEMUR 2 VIEWS; DG C-ARM 61-120 MIN COMPARISON:  01/14/2018 FLUOROSCOPY TIME:  Radiation Exposure Index (as provided by the fluoroscopic device): Not available If the device does not provide the exposure index: Fluoroscopy Time:  2 minutes 28 seconds Number of Acquired Images:  For FINDINGS: Medullary rod is noted within the right femur. Fixation screws noted traversing the femoral neck. The fracture fragments are in near anatomic alignment. IMPRESSION: ORIF of proximal right femoral fracture. Electronically Signed   By: Inez Catalina M.D.   On: 01/15/2018 09:30   Dg Femur, Min 2 Views Right  Result Date: 01/15/2018 CLINICAL DATA:  Right  intratrochanteric fracture EXAM: RIGHT FEMUR 2 VIEWS; DG C-ARM 61-120 MIN COMPARISON:  01/14/2018 FLUOROSCOPY TIME:  Radiation Exposure Index (as provided by the fluoroscopic device): Not available If the device does not provide the exposure index: Fluoroscopy Time:  2 minutes 28 seconds Number of Acquired Images:  For FINDINGS: Medullary rod is noted within the right femur. Fixation screws noted traversing the femoral neck. The fracture fragments are in near anatomic alignment. IMPRESSION: ORIF of proximal right femoral fracture. Electronically Signed   By: Inez Catalina M.D.   On: 01/15/2018 09:30   Dg Femur, Min 2 Views Right  Result Date: 01/14/2018 CLINICAL DATA:  Fall onto concrete. EXAM: RIGHT FEMUR 2 VIEWS  COMPARISON:  None. FINDINGS: Acute intertrochanteric right femur fracture with mild varus angulation. The hip is located. Osteopenia. IMPRESSION: Acute intertrochanteric right femur fracture. Electronically Signed   By: Monte Fantasia M.D.   On: 01/14/2018 13:03    Micro Results    Recent Results (from the past 240 hour(s))  Surgical pcr screen     Status: Abnormal   Collection Time: 01/14/18  6:19 PM  Result Value Ref Range Status   MRSA, PCR NEGATIVE NEGATIVE Final   Staphylococcus aureus POSITIVE (A) NEGATIVE Final    Comment: (NOTE) The Xpert SA Assay (FDA approved for NASAL specimens in patients 67 years of age and older), is one component of a comprehensive surveillance program. It is not intended to diagnose infection nor to guide or monitor treatment. Performed at [facility], [place] [address], [place], [place] [address]        Today   Subjective    Elysha Daw today has no new complaints, eager to go home,  RN and son at bedside, questions          Patient has been seen and examined prior to discharge   Objective   Blood pressure (!) 112/49, pulse 71, temperature 98.7 F (37.1 C), resp. rate 18, height 5\' 4"  (1.626 m), weight 45.4 kg (100 lb), SpO2 99 %.   Intake/Output Summary (Last 24 hours) at 01/17/2018 1519 Last data filed at 01/17/2018 1420 Gross per 24 hour  Intake 1023 ml  Output -  Net 1023 ml    Exam Gen:- Awake Alert,  In no apparent distress  HEENT:- [place].AT, No sclera icterus Neck-Supple Neck,No JVD,.  Lungs-  CTAB , good air movement CV- S1, S2 normal, H/o A. fib patient appears to be regular Abd-  +ve B.Sounds, Abd Soft, No tenderness,    Extremity/Skin:- No  edema,   right postop wound clean dry and intact no evidence of significant ecchymosis or bleeding Psych-affect is appropriate, oriented x3 Neuro-no new focal deficits, no tremors   Data Review   CBC w Diff:  Lab Results  Component Value Date   WBC 7.3 01/17/2018   HGB 9.2  (L) 01/17/2018   HCT 28.2 (L) 01/17/2018   PLT 172 01/17/2018   LYMPHOPCT 10 01/14/2018   MONOPCT 7 01/14/2018   EOSPCT 1 01/14/2018   BASOPCT 0 01/14/2018    CMP:  Lab Results  Component Value Date   NA 134 (L) 01/17/2018   K 4.3 01/17/2018   CL 101 01/17/2018   CO2 28 01/17/2018   BUN 13 01/17/2018   CREATININE 0.66 01/17/2018   PROT 7.1 01/24/2017   ALBUMIN 4.1 01/24/2017   BILITOT 0.6 01/24/2017   ALKPHOS 70 01/24/2017   AST 18 01/24/2017   ALT 14 01/24/2017  .   Total Discharge  time is about 33 minutes  Roxan Hockey M.D on 01/17/2018 at 3:19 PM  [facility]   Office  [phone]  Voice Recognition Viviann Spare dictation system was used to create this note, attempts have been made to correct errors. Please contact the author with questions and/or clarifications.

## 2018-01-17 NOTE — Care Management Note (Signed)
Case Management Note  Patient Details  Name: [name] MRN: [id number] Date of Birth: Feb 22, 1930  Subjective/Objective:   84 yr old female s/p IM nailing of right hip fracture sustained after a fall.                 Action/Plan: Case manager spoke with patient and her son, Harrie Jeans concerning discharge plan and DME. Choice for Home Health agency was offered, Harrie Jeans says his preference is [facility]. Case manager called referral to Neoma Laming, [place] Liaison. Harrie Jeans will be checking in on his mom when she goes home. Case manager also provided him with private duty list and [employer].  Expected Discharge Date:    01/17/18              Expected Discharge Plan:  [place]  In-House Referral:  NA  Discharge planning Services  CM Consult  Post Acute Care Choice:  Durable Medical Equipment, Home Health Choice offered to:  Adult Children, Patient  DME Arranged:  3-N-1, [place] rolling DME Agency:  [facility] Arranged:  [facility] [place] Agency:  [facility]  Status of Service:  Completed, signed off  If discussed at [place] of Stay Meetings, dates discussed:    Additional Comments:  Ninfa Meeker, RN 01/17/2018, 2:01 PM

## 2018-01-18 DIAGNOSIS — I480 Paroxysmal atrial fibrillation: Secondary | ICD-10-CM | POA: Diagnosis not present

## 2018-01-18 DIAGNOSIS — M810 Age-related osteoporosis without current pathological fracture: Secondary | ICD-10-CM | POA: Diagnosis not present

## 2018-01-18 DIAGNOSIS — Z96641 Presence of right artificial hip joint: Secondary | ICD-10-CM | POA: Diagnosis not present

## 2018-01-18 DIAGNOSIS — M8580 Other specified disorders of bone density and structure, unspecified site: Secondary | ICD-10-CM | POA: Diagnosis not present

## 2018-01-18 DIAGNOSIS — Z8551 Personal history of malignant neoplasm of bladder: Secondary | ICD-10-CM | POA: Diagnosis not present

## 2018-01-18 DIAGNOSIS — Q249 Congenital malformation of heart, unspecified: Secondary | ICD-10-CM | POA: Diagnosis not present

## 2018-01-18 DIAGNOSIS — I10 Essential (primary) hypertension: Secondary | ICD-10-CM | POA: Diagnosis not present

## 2018-01-18 DIAGNOSIS — D62 Acute posthemorrhagic anemia: Secondary | ICD-10-CM | POA: Diagnosis not present

## 2018-01-18 DIAGNOSIS — Z7982 Long term (current) use of aspirin: Secondary | ICD-10-CM | POA: Diagnosis not present

## 2018-01-18 DIAGNOSIS — M5136 Other intervertebral disc degeneration, lumbar region: Secondary | ICD-10-CM | POA: Diagnosis not present

## 2018-01-18 DIAGNOSIS — S72141D Displaced intertrochanteric fracture of right femur, subsequent encounter for closed fracture with routine healing: Secondary | ICD-10-CM | POA: Diagnosis not present

## 2018-01-18 DIAGNOSIS — M479 Spondylosis, unspecified: Secondary | ICD-10-CM | POA: Diagnosis not present

## 2018-01-18 DIAGNOSIS — W010XXD Fall on same level from slipping, tripping and stumbling without subsequent striking against object, subsequent encounter: Secondary | ICD-10-CM | POA: Diagnosis not present

## 2018-01-19 ENCOUNTER — Telehealth: Payer: Self-pay

## 2018-01-19 NOTE — Telephone Encounter (Signed)
Pt on TCM list. Admitted on 01/14/2018 and dc'ed on 01/17/2018 after a fall that resulted in a break. Pt has a ntertrochanteric fracture of right femur. Pt to follow up with ortho.

## 2018-01-23 DIAGNOSIS — I480 Paroxysmal atrial fibrillation: Secondary | ICD-10-CM | POA: Diagnosis not present

## 2018-01-23 DIAGNOSIS — S72141D Displaced intertrochanteric fracture of right femur, subsequent encounter for closed fracture with routine healing: Secondary | ICD-10-CM | POA: Diagnosis not present

## 2018-01-23 DIAGNOSIS — M8580 Other specified disorders of bone density and structure, unspecified site: Secondary | ICD-10-CM | POA: Diagnosis not present

## 2018-01-23 DIAGNOSIS — Q249 Congenital malformation of heart, unspecified: Secondary | ICD-10-CM | POA: Diagnosis not present

## 2018-01-23 DIAGNOSIS — Z96641 Presence of right artificial hip joint: Secondary | ICD-10-CM | POA: Diagnosis not present

## 2018-01-23 DIAGNOSIS — Z7982 Long term (current) use of aspirin: Secondary | ICD-10-CM | POA: Diagnosis not present

## 2018-01-23 DIAGNOSIS — M5136 Other intervertebral disc degeneration, lumbar region: Secondary | ICD-10-CM | POA: Diagnosis not present

## 2018-01-23 DIAGNOSIS — D62 Acute posthemorrhagic anemia: Secondary | ICD-10-CM | POA: Diagnosis not present

## 2018-01-23 DIAGNOSIS — M810 Age-related osteoporosis without current pathological fracture: Secondary | ICD-10-CM | POA: Diagnosis not present

## 2018-01-23 DIAGNOSIS — Z8551 Personal history of malignant neoplasm of bladder: Secondary | ICD-10-CM | POA: Diagnosis not present

## 2018-01-23 DIAGNOSIS — W010XXD Fall on same level from slipping, tripping and stumbling without subsequent striking against object, subsequent encounter: Secondary | ICD-10-CM | POA: Diagnosis not present

## 2018-01-23 DIAGNOSIS — M479 Spondylosis, unspecified: Secondary | ICD-10-CM | POA: Diagnosis not present

## 2018-01-23 DIAGNOSIS — I10 Essential (primary) hypertension: Secondary | ICD-10-CM | POA: Diagnosis not present

## 2018-01-24 DIAGNOSIS — I10 Essential (primary) hypertension: Secondary | ICD-10-CM | POA: Diagnosis not present

## 2018-01-24 DIAGNOSIS — M479 Spondylosis, unspecified: Secondary | ICD-10-CM | POA: Diagnosis not present

## 2018-01-24 DIAGNOSIS — D62 Acute posthemorrhagic anemia: Secondary | ICD-10-CM | POA: Diagnosis not present

## 2018-01-24 DIAGNOSIS — M810 Age-related osteoporosis without current pathological fracture: Secondary | ICD-10-CM | POA: Diagnosis not present

## 2018-01-24 DIAGNOSIS — M5136 Other intervertebral disc degeneration, lumbar region: Secondary | ICD-10-CM | POA: Diagnosis not present

## 2018-01-24 DIAGNOSIS — Z7982 Long term (current) use of aspirin: Secondary | ICD-10-CM | POA: Diagnosis not present

## 2018-01-24 DIAGNOSIS — Q249 Congenital malformation of heart, unspecified: Secondary | ICD-10-CM | POA: Diagnosis not present

## 2018-01-24 DIAGNOSIS — W010XXD Fall on same level from slipping, tripping and stumbling without subsequent striking against object, subsequent encounter: Secondary | ICD-10-CM | POA: Diagnosis not present

## 2018-01-24 DIAGNOSIS — Z96641 Presence of right artificial hip joint: Secondary | ICD-10-CM | POA: Diagnosis not present

## 2018-01-24 DIAGNOSIS — Z8551 Personal history of malignant neoplasm of bladder: Secondary | ICD-10-CM | POA: Diagnosis not present

## 2018-01-24 DIAGNOSIS — M8580 Other specified disorders of bone density and structure, unspecified site: Secondary | ICD-10-CM | POA: Diagnosis not present

## 2018-01-24 DIAGNOSIS — I480 Paroxysmal atrial fibrillation: Secondary | ICD-10-CM | POA: Diagnosis not present

## 2018-01-24 DIAGNOSIS — S72141D Displaced intertrochanteric fracture of right femur, subsequent encounter for closed fracture with routine healing: Secondary | ICD-10-CM | POA: Diagnosis not present

## 2018-01-25 ENCOUNTER — Ambulatory Visit: Payer: Medicare Other

## 2018-01-26 DIAGNOSIS — W010XXD Fall on same level from slipping, tripping and stumbling without subsequent striking against object, subsequent encounter: Secondary | ICD-10-CM | POA: Diagnosis not present

## 2018-01-26 DIAGNOSIS — M810 Age-related osteoporosis without current pathological fracture: Secondary | ICD-10-CM | POA: Diagnosis not present

## 2018-01-26 DIAGNOSIS — M479 Spondylosis, unspecified: Secondary | ICD-10-CM | POA: Diagnosis not present

## 2018-01-26 DIAGNOSIS — S72141D Displaced intertrochanteric fracture of right femur, subsequent encounter for closed fracture with routine healing: Secondary | ICD-10-CM | POA: Diagnosis not present

## 2018-01-26 DIAGNOSIS — I10 Essential (primary) hypertension: Secondary | ICD-10-CM | POA: Diagnosis not present

## 2018-01-26 DIAGNOSIS — M8580 Other specified disorders of bone density and structure, unspecified site: Secondary | ICD-10-CM | POA: Diagnosis not present

## 2018-01-26 DIAGNOSIS — Z96641 Presence of right artificial hip joint: Secondary | ICD-10-CM | POA: Diagnosis not present

## 2018-01-26 DIAGNOSIS — M5136 Other intervertebral disc degeneration, lumbar region: Secondary | ICD-10-CM | POA: Diagnosis not present

## 2018-01-26 DIAGNOSIS — D62 Acute posthemorrhagic anemia: Secondary | ICD-10-CM | POA: Diagnosis not present

## 2018-01-26 DIAGNOSIS — Z7982 Long term (current) use of aspirin: Secondary | ICD-10-CM | POA: Diagnosis not present

## 2018-01-26 DIAGNOSIS — Z8551 Personal history of malignant neoplasm of bladder: Secondary | ICD-10-CM | POA: Diagnosis not present

## 2018-01-26 DIAGNOSIS — Q249 Congenital malformation of heart, unspecified: Secondary | ICD-10-CM | POA: Diagnosis not present

## 2018-01-26 DIAGNOSIS — I480 Paroxysmal atrial fibrillation: Secondary | ICD-10-CM | POA: Diagnosis not present

## 2018-01-29 DIAGNOSIS — Q249 Congenital malformation of heart, unspecified: Secondary | ICD-10-CM | POA: Diagnosis not present

## 2018-01-29 DIAGNOSIS — Z96641 Presence of right artificial hip joint: Secondary | ICD-10-CM | POA: Diagnosis not present

## 2018-01-29 DIAGNOSIS — W010XXD Fall on same level from slipping, tripping and stumbling without subsequent striking against object, subsequent encounter: Secondary | ICD-10-CM | POA: Diagnosis not present

## 2018-01-29 DIAGNOSIS — Z8551 Personal history of malignant neoplasm of bladder: Secondary | ICD-10-CM | POA: Diagnosis not present

## 2018-01-29 DIAGNOSIS — D62 Acute posthemorrhagic anemia: Secondary | ICD-10-CM | POA: Diagnosis not present

## 2018-01-29 DIAGNOSIS — M479 Spondylosis, unspecified: Secondary | ICD-10-CM | POA: Diagnosis not present

## 2018-01-29 DIAGNOSIS — I10 Essential (primary) hypertension: Secondary | ICD-10-CM | POA: Diagnosis not present

## 2018-01-29 DIAGNOSIS — I480 Paroxysmal atrial fibrillation: Secondary | ICD-10-CM | POA: Diagnosis not present

## 2018-01-29 DIAGNOSIS — M5136 Other intervertebral disc degeneration, lumbar region: Secondary | ICD-10-CM | POA: Diagnosis not present

## 2018-01-29 DIAGNOSIS — S72141D Displaced intertrochanteric fracture of right femur, subsequent encounter for closed fracture with routine healing: Secondary | ICD-10-CM | POA: Diagnosis not present

## 2018-01-29 DIAGNOSIS — M810 Age-related osteoporosis without current pathological fracture: Secondary | ICD-10-CM | POA: Diagnosis not present

## 2018-01-29 DIAGNOSIS — M8580 Other specified disorders of bone density and structure, unspecified site: Secondary | ICD-10-CM | POA: Diagnosis not present

## 2018-01-29 DIAGNOSIS — Z7982 Long term (current) use of aspirin: Secondary | ICD-10-CM | POA: Diagnosis not present

## 2018-01-30 DIAGNOSIS — S72141A Displaced intertrochanteric fracture of right femur, initial encounter for closed fracture: Secondary | ICD-10-CM | POA: Diagnosis not present

## 2018-01-31 DIAGNOSIS — Z8551 Personal history of malignant neoplasm of bladder: Secondary | ICD-10-CM | POA: Diagnosis not present

## 2018-01-31 DIAGNOSIS — M479 Spondylosis, unspecified: Secondary | ICD-10-CM | POA: Diagnosis not present

## 2018-01-31 DIAGNOSIS — Q249 Congenital malformation of heart, unspecified: Secondary | ICD-10-CM | POA: Diagnosis not present

## 2018-01-31 DIAGNOSIS — M5136 Other intervertebral disc degeneration, lumbar region: Secondary | ICD-10-CM | POA: Diagnosis not present

## 2018-01-31 DIAGNOSIS — Z96641 Presence of right artificial hip joint: Secondary | ICD-10-CM | POA: Diagnosis not present

## 2018-01-31 DIAGNOSIS — Z7982 Long term (current) use of aspirin: Secondary | ICD-10-CM | POA: Diagnosis not present

## 2018-01-31 DIAGNOSIS — D62 Acute posthemorrhagic anemia: Secondary | ICD-10-CM | POA: Diagnosis not present

## 2018-01-31 DIAGNOSIS — I480 Paroxysmal atrial fibrillation: Secondary | ICD-10-CM | POA: Diagnosis not present

## 2018-01-31 DIAGNOSIS — W010XXD Fall on same level from slipping, tripping and stumbling without subsequent striking against object, subsequent encounter: Secondary | ICD-10-CM | POA: Diagnosis not present

## 2018-01-31 DIAGNOSIS — I10 Essential (primary) hypertension: Secondary | ICD-10-CM | POA: Diagnosis not present

## 2018-01-31 DIAGNOSIS — M8580 Other specified disorders of bone density and structure, unspecified site: Secondary | ICD-10-CM | POA: Diagnosis not present

## 2018-01-31 DIAGNOSIS — M810 Age-related osteoporosis without current pathological fracture: Secondary | ICD-10-CM | POA: Diagnosis not present

## 2018-01-31 DIAGNOSIS — S72141D Displaced intertrochanteric fracture of right femur, subsequent encounter for closed fracture with routine healing: Secondary | ICD-10-CM | POA: Diagnosis not present

## 2018-02-02 DIAGNOSIS — I480 Paroxysmal atrial fibrillation: Secondary | ICD-10-CM | POA: Diagnosis not present

## 2018-02-02 DIAGNOSIS — M5136 Other intervertebral disc degeneration, lumbar region: Secondary | ICD-10-CM | POA: Diagnosis not present

## 2018-02-02 DIAGNOSIS — I10 Essential (primary) hypertension: Secondary | ICD-10-CM | POA: Diagnosis not present

## 2018-02-02 DIAGNOSIS — Z7982 Long term (current) use of aspirin: Secondary | ICD-10-CM | POA: Diagnosis not present

## 2018-02-02 DIAGNOSIS — M810 Age-related osteoporosis without current pathological fracture: Secondary | ICD-10-CM | POA: Diagnosis not present

## 2018-02-02 DIAGNOSIS — S72141D Displaced intertrochanteric fracture of right femur, subsequent encounter for closed fracture with routine healing: Secondary | ICD-10-CM | POA: Diagnosis not present

## 2018-02-02 DIAGNOSIS — M479 Spondylosis, unspecified: Secondary | ICD-10-CM | POA: Diagnosis not present

## 2018-02-02 DIAGNOSIS — Z8551 Personal history of malignant neoplasm of bladder: Secondary | ICD-10-CM | POA: Diagnosis not present

## 2018-02-02 DIAGNOSIS — D62 Acute posthemorrhagic anemia: Secondary | ICD-10-CM | POA: Diagnosis not present

## 2018-02-02 DIAGNOSIS — M8580 Other specified disorders of bone density and structure, unspecified site: Secondary | ICD-10-CM | POA: Diagnosis not present

## 2018-02-02 DIAGNOSIS — Q249 Congenital malformation of heart, unspecified: Secondary | ICD-10-CM | POA: Diagnosis not present

## 2018-02-02 DIAGNOSIS — W010XXD Fall on same level from slipping, tripping and stumbling without subsequent striking against object, subsequent encounter: Secondary | ICD-10-CM | POA: Diagnosis not present

## 2018-02-02 DIAGNOSIS — Z96641 Presence of right artificial hip joint: Secondary | ICD-10-CM | POA: Diagnosis not present

## 2018-02-05 DIAGNOSIS — Z8551 Personal history of malignant neoplasm of bladder: Secondary | ICD-10-CM | POA: Diagnosis not present

## 2018-02-05 DIAGNOSIS — Q249 Congenital malformation of heart, unspecified: Secondary | ICD-10-CM | POA: Diagnosis not present

## 2018-02-05 DIAGNOSIS — S72141D Displaced intertrochanteric fracture of right femur, subsequent encounter for closed fracture with routine healing: Secondary | ICD-10-CM | POA: Diagnosis not present

## 2018-02-05 DIAGNOSIS — Z96641 Presence of right artificial hip joint: Secondary | ICD-10-CM | POA: Diagnosis not present

## 2018-02-05 DIAGNOSIS — D62 Acute posthemorrhagic anemia: Secondary | ICD-10-CM | POA: Diagnosis not present

## 2018-02-05 DIAGNOSIS — M479 Spondylosis, unspecified: Secondary | ICD-10-CM | POA: Diagnosis not present

## 2018-02-05 DIAGNOSIS — Z7982 Long term (current) use of aspirin: Secondary | ICD-10-CM | POA: Diagnosis not present

## 2018-02-05 DIAGNOSIS — I480 Paroxysmal atrial fibrillation: Secondary | ICD-10-CM | POA: Diagnosis not present

## 2018-02-05 DIAGNOSIS — I10 Essential (primary) hypertension: Secondary | ICD-10-CM | POA: Diagnosis not present

## 2018-02-05 DIAGNOSIS — M5136 Other intervertebral disc degeneration, lumbar region: Secondary | ICD-10-CM | POA: Diagnosis not present

## 2018-02-05 DIAGNOSIS — W010XXD Fall on same level from slipping, tripping and stumbling without subsequent striking against object, subsequent encounter: Secondary | ICD-10-CM | POA: Diagnosis not present

## 2018-02-05 DIAGNOSIS — M8580 Other specified disorders of bone density and structure, unspecified site: Secondary | ICD-10-CM | POA: Diagnosis not present

## 2018-02-05 DIAGNOSIS — M810 Age-related osteoporosis without current pathological fracture: Secondary | ICD-10-CM | POA: Diagnosis not present

## 2018-02-06 DIAGNOSIS — M479 Spondylosis, unspecified: Secondary | ICD-10-CM | POA: Diagnosis not present

## 2018-02-06 DIAGNOSIS — S72141D Displaced intertrochanteric fracture of right femur, subsequent encounter for closed fracture with routine healing: Secondary | ICD-10-CM | POA: Diagnosis not present

## 2018-02-06 DIAGNOSIS — D62 Acute posthemorrhagic anemia: Secondary | ICD-10-CM | POA: Diagnosis not present

## 2018-02-06 DIAGNOSIS — I10 Essential (primary) hypertension: Secondary | ICD-10-CM | POA: Diagnosis not present

## 2018-02-06 DIAGNOSIS — M810 Age-related osteoporosis without current pathological fracture: Secondary | ICD-10-CM | POA: Diagnosis not present

## 2018-02-06 DIAGNOSIS — Q249 Congenital malformation of heart, unspecified: Secondary | ICD-10-CM | POA: Diagnosis not present

## 2018-02-06 DIAGNOSIS — I480 Paroxysmal atrial fibrillation: Secondary | ICD-10-CM | POA: Diagnosis not present

## 2018-02-06 DIAGNOSIS — M8580 Other specified disorders of bone density and structure, unspecified site: Secondary | ICD-10-CM | POA: Diagnosis not present

## 2018-02-06 DIAGNOSIS — M5136 Other intervertebral disc degeneration, lumbar region: Secondary | ICD-10-CM | POA: Diagnosis not present

## 2018-02-06 DIAGNOSIS — W010XXD Fall on same level from slipping, tripping and stumbling without subsequent striking against object, subsequent encounter: Secondary | ICD-10-CM | POA: Diagnosis not present

## 2018-02-06 DIAGNOSIS — Z8551 Personal history of malignant neoplasm of bladder: Secondary | ICD-10-CM | POA: Diagnosis not present

## 2018-02-06 DIAGNOSIS — Z96641 Presence of right artificial hip joint: Secondary | ICD-10-CM | POA: Diagnosis not present

## 2018-02-06 DIAGNOSIS — Z7982 Long term (current) use of aspirin: Secondary | ICD-10-CM | POA: Diagnosis not present

## 2018-02-07 DIAGNOSIS — Z8551 Personal history of malignant neoplasm of bladder: Secondary | ICD-10-CM | POA: Diagnosis not present

## 2018-02-07 DIAGNOSIS — Q249 Congenital malformation of heart, unspecified: Secondary | ICD-10-CM | POA: Diagnosis not present

## 2018-02-07 DIAGNOSIS — M810 Age-related osteoporosis without current pathological fracture: Secondary | ICD-10-CM | POA: Diagnosis not present

## 2018-02-07 DIAGNOSIS — W010XXD Fall on same level from slipping, tripping and stumbling without subsequent striking against object, subsequent encounter: Secondary | ICD-10-CM | POA: Diagnosis not present

## 2018-02-07 DIAGNOSIS — M479 Spondylosis, unspecified: Secondary | ICD-10-CM | POA: Diagnosis not present

## 2018-02-07 DIAGNOSIS — I10 Essential (primary) hypertension: Secondary | ICD-10-CM | POA: Diagnosis not present

## 2018-02-07 DIAGNOSIS — I480 Paroxysmal atrial fibrillation: Secondary | ICD-10-CM | POA: Diagnosis not present

## 2018-02-07 DIAGNOSIS — S72141D Displaced intertrochanteric fracture of right femur, subsequent encounter for closed fracture with routine healing: Secondary | ICD-10-CM | POA: Diagnosis not present

## 2018-02-07 DIAGNOSIS — M5136 Other intervertebral disc degeneration, lumbar region: Secondary | ICD-10-CM | POA: Diagnosis not present

## 2018-02-07 DIAGNOSIS — M8580 Other specified disorders of bone density and structure, unspecified site: Secondary | ICD-10-CM | POA: Diagnosis not present

## 2018-02-07 DIAGNOSIS — Z7982 Long term (current) use of aspirin: Secondary | ICD-10-CM | POA: Diagnosis not present

## 2018-02-07 DIAGNOSIS — Z96641 Presence of right artificial hip joint: Secondary | ICD-10-CM | POA: Diagnosis not present

## 2018-02-07 DIAGNOSIS — D62 Acute posthemorrhagic anemia: Secondary | ICD-10-CM | POA: Diagnosis not present

## 2018-02-09 DIAGNOSIS — M545 Low back pain: Secondary | ICD-10-CM | POA: Diagnosis not present

## 2018-02-09 DIAGNOSIS — R6889 Other general symptoms and signs: Secondary | ICD-10-CM | POA: Diagnosis not present

## 2018-02-09 DIAGNOSIS — S32000S Wedge compression fracture of unspecified lumbar vertebra, sequela: Secondary | ICD-10-CM | POA: Diagnosis not present

## 2018-02-14 DIAGNOSIS — M545 Low back pain: Secondary | ICD-10-CM | POA: Diagnosis not present

## 2018-02-15 DIAGNOSIS — I480 Paroxysmal atrial fibrillation: Secondary | ICD-10-CM | POA: Diagnosis not present

## 2018-02-15 DIAGNOSIS — M479 Spondylosis, unspecified: Secondary | ICD-10-CM | POA: Diagnosis not present

## 2018-02-15 DIAGNOSIS — Q249 Congenital malformation of heart, unspecified: Secondary | ICD-10-CM | POA: Diagnosis not present

## 2018-02-15 DIAGNOSIS — S72141D Displaced intertrochanteric fracture of right femur, subsequent encounter for closed fracture with routine healing: Secondary | ICD-10-CM | POA: Diagnosis not present

## 2018-02-15 DIAGNOSIS — M5136 Other intervertebral disc degeneration, lumbar region: Secondary | ICD-10-CM | POA: Diagnosis not present

## 2018-02-15 DIAGNOSIS — Z96641 Presence of right artificial hip joint: Secondary | ICD-10-CM | POA: Diagnosis not present

## 2018-02-15 DIAGNOSIS — Z7982 Long term (current) use of aspirin: Secondary | ICD-10-CM | POA: Diagnosis not present

## 2018-02-15 DIAGNOSIS — M810 Age-related osteoporosis without current pathological fracture: Secondary | ICD-10-CM | POA: Diagnosis not present

## 2018-02-15 DIAGNOSIS — Z8551 Personal history of malignant neoplasm of bladder: Secondary | ICD-10-CM | POA: Diagnosis not present

## 2018-02-15 DIAGNOSIS — W010XXD Fall on same level from slipping, tripping and stumbling without subsequent striking against object, subsequent encounter: Secondary | ICD-10-CM | POA: Diagnosis not present

## 2018-02-15 DIAGNOSIS — I10 Essential (primary) hypertension: Secondary | ICD-10-CM | POA: Diagnosis not present

## 2018-02-15 DIAGNOSIS — M8580 Other specified disorders of bone density and structure, unspecified site: Secondary | ICD-10-CM | POA: Diagnosis not present

## 2018-02-15 DIAGNOSIS — D62 Acute posthemorrhagic anemia: Secondary | ICD-10-CM | POA: Diagnosis not present

## 2018-02-19 DIAGNOSIS — M545 Low back pain: Secondary | ICD-10-CM | POA: Diagnosis not present

## 2018-02-20 DIAGNOSIS — M8580 Other specified disorders of bone density and structure, unspecified site: Secondary | ICD-10-CM | POA: Diagnosis not present

## 2018-02-20 DIAGNOSIS — S72141D Displaced intertrochanteric fracture of right femur, subsequent encounter for closed fracture with routine healing: Secondary | ICD-10-CM | POA: Diagnosis not present

## 2018-02-20 DIAGNOSIS — I480 Paroxysmal atrial fibrillation: Secondary | ICD-10-CM | POA: Diagnosis not present

## 2018-02-20 DIAGNOSIS — M479 Spondylosis, unspecified: Secondary | ICD-10-CM | POA: Diagnosis not present

## 2018-02-20 DIAGNOSIS — D62 Acute posthemorrhagic anemia: Secondary | ICD-10-CM | POA: Diagnosis not present

## 2018-02-20 DIAGNOSIS — W010XXD Fall on same level from slipping, tripping and stumbling without subsequent striking against object, subsequent encounter: Secondary | ICD-10-CM | POA: Diagnosis not present

## 2018-02-20 DIAGNOSIS — M810 Age-related osteoporosis without current pathological fracture: Secondary | ICD-10-CM | POA: Diagnosis not present

## 2018-02-20 DIAGNOSIS — Z7982 Long term (current) use of aspirin: Secondary | ICD-10-CM | POA: Diagnosis not present

## 2018-02-20 DIAGNOSIS — Z96641 Presence of right artificial hip joint: Secondary | ICD-10-CM | POA: Diagnosis not present

## 2018-02-20 DIAGNOSIS — I10 Essential (primary) hypertension: Secondary | ICD-10-CM | POA: Diagnosis not present

## 2018-02-20 DIAGNOSIS — M5136 Other intervertebral disc degeneration, lumbar region: Secondary | ICD-10-CM | POA: Diagnosis not present

## 2018-02-20 DIAGNOSIS — Q249 Congenital malformation of heart, unspecified: Secondary | ICD-10-CM | POA: Diagnosis not present

## 2018-02-20 DIAGNOSIS — Z8551 Personal history of malignant neoplasm of bladder: Secondary | ICD-10-CM | POA: Diagnosis not present

## 2018-02-26 DIAGNOSIS — M545 Low back pain: Secondary | ICD-10-CM | POA: Diagnosis not present

## 2018-02-27 ENCOUNTER — Other Ambulatory Visit: Payer: Self-pay | Admitting: Orthopedic Surgery

## 2018-03-06 ENCOUNTER — Encounter (HOSPITAL_COMMUNITY): Payer: Self-pay

## 2018-03-08 ENCOUNTER — Ambulatory Visit (HOSPITAL_COMMUNITY): Payer: Medicare Other

## 2018-03-08 ENCOUNTER — Ambulatory Visit (HOSPITAL_COMMUNITY)
Admission: RE | Admit: 2018-03-08 | Discharge: 2018-03-08 | Disposition: A | Discharge status: Home / Self Care | Payer: Medicare Other | Source: Ambulatory Visit | Attending: Orthopedic Surgery | Admitting: Orthopedic Surgery

## 2018-03-08 ENCOUNTER — Ambulatory Visit (HOSPITAL_COMMUNITY): Payer: Medicare Other | Admitting: Certified Registered"

## 2018-03-08 ENCOUNTER — Encounter (HOSPITAL_COMMUNITY)
Admission: RE | Disposition: A | Discharge status: Home / Self Care | Payer: Self-pay | Source: Ambulatory Visit | Attending: Orthopedic Surgery

## 2018-03-08 DIAGNOSIS — Z7982 Long term (current) use of aspirin: Secondary | ICD-10-CM | POA: Diagnosis not present

## 2018-03-08 DIAGNOSIS — Z419 Encounter for procedure for purposes other than remedying health state, unspecified: Secondary | ICD-10-CM

## 2018-03-08 DIAGNOSIS — M479 Spondylosis, unspecified: Secondary | ICD-10-CM | POA: Diagnosis not present

## 2018-03-08 DIAGNOSIS — Z8249 Family history of ischemic heart disease and other diseases of the circulatory system: Secondary | ICD-10-CM | POA: Diagnosis not present

## 2018-03-08 DIAGNOSIS — S22000G Wedge compression fracture of unspecified thoracic vertebra, subsequent encounter for fracture with delayed healing: Secondary | ICD-10-CM

## 2018-03-08 DIAGNOSIS — I10 Essential (primary) hypertension: Secondary | ICD-10-CM | POA: Diagnosis not present

## 2018-03-08 DIAGNOSIS — Z79899 Other long term (current) drug therapy: Secondary | ICD-10-CM | POA: Insufficient documentation

## 2018-03-08 DIAGNOSIS — I4891 Unspecified atrial fibrillation: Secondary | ICD-10-CM | POA: Diagnosis not present

## 2018-03-08 DIAGNOSIS — M4854XA Collapsed vertebra, not elsewhere classified, thoracic region, initial encounter for fracture: Secondary | ICD-10-CM | POA: Insufficient documentation

## 2018-03-08 DIAGNOSIS — Z859 Personal history of malignant neoplasm, unspecified: Secondary | ICD-10-CM | POA: Insufficient documentation

## 2018-03-08 DIAGNOSIS — Z981 Arthrodesis status: Secondary | ICD-10-CM | POA: Diagnosis not present

## 2018-03-08 DIAGNOSIS — E559 Vitamin D deficiency, unspecified: Secondary | ICD-10-CM | POA: Diagnosis not present

## 2018-03-08 HISTORY — PX: KYPHOPLASTY: SHX5884

## 2018-03-08 LAB — CBC WITH DIFFERENTIAL/PLATELET
ABS IMMATURE GRANULOCYTES: 0 10*3/uL (ref 0.0–0.1)
Basophils Absolute: 0 10*3/uL (ref 0.0–0.1)
Basophils Relative: 1 %
Eosinophils Absolute: 0.1 10*3/uL (ref 0.0–0.7)
Eosinophils Relative: 3 %
HEMATOCRIT: 42.5 % (ref 36.0–46.0)
HEMOGLOBIN: 13.1 g/dL (ref 12.0–15.0)
Immature Granulocytes: 0 %
LYMPHS ABS: 1.2 10*3/uL (ref 0.7–4.0)
LYMPHS PCT: 23 %
MCH: 30.5 pg (ref 26.0–34.0)
MCHC: 30.8 g/dL (ref 30.0–36.0)
MCV: 99.1 fL (ref 78.0–100.0)
MONOS PCT: 11 %
Monocytes Absolute: 0.6 10*3/uL (ref 0.1–1.0)
NEUTROS ABS: 3.1 10*3/uL (ref 1.7–7.7)
Neutrophils Relative %: 62 %
Platelets: 284 10*3/uL (ref 150–400)
RBC: 4.29 MIL/uL (ref 3.87–5.11)
RDW: 13.4 % (ref 11.5–15.5)
WBC: 5 10*3/uL (ref 4.0–10.5)

## 2018-03-08 LAB — COMPREHENSIVE METABOLIC PANEL
ALK PHOS: 149 U/L — AB (ref 38–126)
ALT: 13 U/L (ref 0–44)
AST: 19 U/L (ref 15–41)
Albumin: 3.7 g/dL (ref 3.5–5.0)
Anion gap: 9 (ref 5–15)
BILIRUBIN TOTAL: 0.7 mg/dL (ref 0.3–1.2)
BUN: 13 mg/dL (ref 8–23)
CO2: 30 mmol/L (ref 22–32)
CREATININE: 0.67 mg/dL (ref 0.44–1.00)
Calcium: 9.3 mg/dL (ref 8.9–10.3)
Chloride: 101 mmol/L (ref 98–111)
GFR calc Af Amer: >60 mL/min (ref >60)
Glucose, Bld: 124 mg/dL — ABNORMAL HIGH (ref 70–99)
Potassium: 3 mmol/L — ABNORMAL LOW (ref 3.5–5.1)
Sodium: 140 mmol/L (ref 135–145)
Total Protein: 7 g/dL (ref 6.5–8.1)

## 2018-03-08 LAB — URINALYSIS, ROUTINE W REFLEX MICROSCOPIC
Bilirubin Urine: NEGATIVE
Glucose, UA: NEGATIVE mg/dL
Hgb urine dipstick: NEGATIVE
Ketones, ur: NEGATIVE mg/dL
LEUKOCYTES UA: NEGATIVE
Nitrite: NEGATIVE
PROTEIN: NEGATIVE mg/dL
Specific Gravity, Urine: 1.009 (ref 1.005–1.030)
pH: 8 (ref 5.0–8.0)

## 2018-03-08 LAB — SURGICAL PCR SCREEN
MRSA, PCR: NEGATIVE
Staphylococcus aureus: POSITIVE — AB

## 2018-03-08 LAB — TYPE AND SCREEN
ABO/RH(D): A POS
Antibody Screen: NEGATIVE

## 2018-03-08 LAB — PROTIME-INR
INR: 0.94
PROTHROMBIN TIME: 12.5 s (ref 11.4–15.2)

## 2018-03-08 LAB — APTT: aPTT: 33 seconds (ref 24–36)

## 2018-03-08 SURGERY — KYPHOPLASTY
Anesthesia: General

## 2018-03-08 MED ORDER — LIDOCAINE 2% (20 MG/ML) 5 ML SYRINGE
INTRAMUSCULAR | Status: DC | PRN
  Administered 2018-03-08: 50 mg via INTRAVENOUS

## 2018-03-08 MED ORDER — PROPOFOL 10 MG/ML IV BOLUS
INTRAVENOUS | Status: AC
  Filled 2018-03-08: qty 20

## 2018-03-08 MED ORDER — CEFAZOLIN SODIUM-DEXTROSE 2-4 GM/100ML-% IV SOLN
2.0000 g | INTRAVENOUS | Status: AC
  Administered 2018-03-08: 2 g via INTRAVENOUS

## 2018-03-08 MED ORDER — 0.9 % SODIUM CHLORIDE (POUR BTL) OPTIME
TOPICAL | Status: DC | PRN
  Administered 2018-03-08: 1000 mL

## 2018-03-08 MED ORDER — LACTATED RINGERS IV SOLN
INTRAVENOUS | Status: DC
  Administered 2018-03-08 (×2): via INTRAVENOUS

## 2018-03-08 MED ORDER — BACITRACIN ZINC 500 UNIT/GM EX OINT
TOPICAL_OINTMENT | CUTANEOUS | Status: AC
  Filled 2018-03-08: qty 28.35

## 2018-03-08 MED ORDER — ONDANSETRON HCL 4 MG/2ML IJ SOLN
INTRAMUSCULAR | Status: DC | PRN
  Administered 2018-03-08: 4 mg via INTRAVENOUS

## 2018-03-08 MED ORDER — OXYCODONE HCL 5 MG/5ML PO SOLN: 5.0000 mg | Freq: Once | ORAL | Status: DC | PRN

## 2018-03-08 MED ORDER — ONDANSETRON HCL 4 MG/2ML IJ SOLN
INTRAMUSCULAR | Status: AC
  Filled 2018-03-08: qty 2

## 2018-03-08 MED ORDER — SUGAMMADEX SODIUM 200 MG/2ML IV SOLN
INTRAVENOUS | Status: DC | PRN
  Administered 2018-03-08: 120 mg via INTRAVENOUS

## 2018-03-08 MED ORDER — ROCURONIUM BROMIDE 50 MG/5ML IV SOSY
PREFILLED_SYRINGE | INTRAVENOUS | Status: DC | PRN
  Administered 2018-03-08: 40 mg via INTRAVENOUS

## 2018-03-08 MED ORDER — PHENYLEPHRINE 40 MCG/ML (10ML) SYRINGE FOR IV PUSH (FOR BLOOD PRESSURE SUPPORT)
PREFILLED_SYRINGE | INTRAVENOUS | Status: DC | PRN
  Administered 2018-03-08: 80 ug via INTRAVENOUS
  Administered 2018-03-08: 120 ug via INTRAVENOUS

## 2018-03-08 MED ORDER — DEXAMETHASONE SODIUM PHOSPHATE 10 MG/ML IJ SOLN
INTRAMUSCULAR | Status: AC
  Filled 2018-03-08: qty 1

## 2018-03-08 MED ORDER — DEXAMETHASONE SODIUM PHOSPHATE 10 MG/ML IJ SOLN
INTRAMUSCULAR | Status: DC | PRN
  Administered 2018-03-08: 10 mg via INTRAVENOUS

## 2018-03-08 MED ORDER — HYDROMORPHONE HCL 1 MG/ML IJ SOLN: 0.2500 mg | INTRAMUSCULAR | Status: DC | PRN

## 2018-03-08 MED ORDER — OXYCODONE HCL 5 MG PO TABS: 5.0000 mg | ORAL_TABLET | Freq: Once | ORAL | Status: DC | PRN

## 2018-03-08 MED ORDER — PHENYLEPHRINE 40 MCG/ML (10ML) SYRINGE FOR IV PUSH (FOR BLOOD PRESSURE SUPPORT)
PREFILLED_SYRINGE | INTRAVENOUS | Status: AC
  Filled 2018-03-08: qty 10

## 2018-03-08 MED ORDER — IOPAMIDOL (ISOVUE-300) INJECTION 61%
INTRAVENOUS | Status: AC
  Filled 2018-03-08: qty 50

## 2018-03-08 MED ORDER — FENTANYL CITRATE (PF) 250 MCG/5ML IJ SOLN
INTRAMUSCULAR | Status: AC
  Filled 2018-03-08: qty 5

## 2018-03-08 MED ORDER — ROCURONIUM BROMIDE 50 MG/5ML IV SOSY
PREFILLED_SYRINGE | INTRAVENOUS | Status: AC
  Filled 2018-03-08: qty 5

## 2018-03-08 MED ORDER — POVIDONE-IODINE 7.5 % EX SOLN
Freq: Once | CUTANEOUS | Status: DC
  Filled 2018-03-08: qty 118

## 2018-03-08 MED ORDER — LABETALOL HCL 5 MG/ML IV SOLN
INTRAVENOUS | Status: DC | PRN
  Administered 2018-03-08: 5 mg via INTRAVENOUS

## 2018-03-08 MED ORDER — BUPIVACAINE-EPINEPHRINE (PF) 0.25% -1:200000 IJ SOLN
INTRAMUSCULAR | Status: AC
  Filled 2018-03-08: qty 30

## 2018-03-08 MED ORDER — PROMETHAZINE HCL 25 MG/ML IJ SOLN: 6.2500 mg | INTRAMUSCULAR | Status: DC | PRN

## 2018-03-08 MED ORDER — IOPAMIDOL (ISOVUE-300) INJECTION 61%
INTRAVENOUS | Status: DC | PRN
  Administered 2018-03-08: 10 mL

## 2018-03-08 MED ORDER — CEFAZOLIN SODIUM 1 G IJ SOLR
INTRAMUSCULAR | Status: AC
  Filled 2018-03-08: qty 20

## 2018-03-08 MED ORDER — LIDOCAINE 2% (20 MG/ML) 5 ML SYRINGE
INTRAMUSCULAR | Status: AC
  Filled 2018-03-08: qty 5

## 2018-03-08 MED ORDER — FENTANYL CITRATE (PF) 250 MCG/5ML IJ SOLN
INTRAMUSCULAR | Status: DC | PRN
  Administered 2018-03-08: 25 ug via INTRAVENOUS
  Administered 2018-03-08: 50 ug via INTRAVENOUS

## 2018-03-08 MED ORDER — PROPOFOL 10 MG/ML IV BOLUS
INTRAVENOUS | Status: DC | PRN
  Administered 2018-03-08: 80 mg via INTRAVENOUS

## 2018-03-08 SURGICAL SUPPLY — 46 items
BLADE SURG 15 STRL LF DISP TIS (BLADE) ×1 IMPLANT
BLADE SURG 15 STRL SS (BLADE) ×2
CEMENT BONE KYPHX HV R (Orthopedic Implant) ×1 IMPLANT
CEMENT KYPHON C01A KIT/MIXER (Cement) ×1 IMPLANT
CLSR STERI-STRIP ANTIMIC 1/2X4 (GAUZE/BANDAGES/DRESSINGS) ×1 IMPLANT
COVER MAYO STAND STRL (DRAPES) ×2 IMPLANT
COVER SURGICAL LIGHT HANDLE (MISCELLANEOUS) ×2 IMPLANT
CURETTE EXPRESS SZ2 7MM (INSTRUMENTS) IMPLANT
CURRETTE EXPRESS SZ2 7MM (INSTRUMENTS)
DRAPE C-ARM 42X72 X-RAY (DRAPES) ×3 IMPLANT
DRAPE HALF SHEET 40X57 (DRAPES) IMPLANT
DRAPE INCISE IOBAN 66X45 STRL (DRAPES) ×2 IMPLANT
DRAPE LAPAROTOMY T 102X78X121 (DRAPES) ×2 IMPLANT
DRAPE SURG 17X23 STRL (DRAPES) ×8 IMPLANT
DURAPREP 26ML APPLICATOR (WOUND CARE) ×2 IMPLANT
GAUZE 4X4 16PLY RFD (DISPOSABLE) ×2 IMPLANT
GAUZE SPONGE 4X4 12PLY STRL LF (GAUZE/BANDAGES/DRESSINGS) ×1 IMPLANT
GLOVE BIO SURGEON STRL SZ7 (GLOVE) ×2 IMPLANT
GLOVE BIO SURGEON STRL SZ8 (GLOVE) ×2 IMPLANT
GLOVE BIOGEL PI IND STRL 7.0 (GLOVE) ×1 IMPLANT
GLOVE BIOGEL PI IND STRL 8 (GLOVE) ×1 IMPLANT
GLOVE BIOGEL PI INDICATOR 7.0 (GLOVE) ×1
GLOVE BIOGEL PI INDICATOR 8 (GLOVE) ×1
GOWN STRL REUS W/ TWL LRG LVL3 (GOWN DISPOSABLE) ×2 IMPLANT
GOWN STRL REUS W/ TWL XL LVL3 (GOWN DISPOSABLE) ×1 IMPLANT
GOWN STRL REUS W/TWL LRG LVL3 (GOWN DISPOSABLE) ×4
GOWN STRL REUS W/TWL XL LVL3 (GOWN DISPOSABLE) ×2
KIT BASIN OR (CUSTOM PROCEDURE TRAY) ×2 IMPLANT
KIT TURNOVER KIT B (KITS) ×2 IMPLANT
NDL HYPO 25X1 1.5 SAFETY (NEEDLE) IMPLANT
NDL SPNL 18GX3.5 QUINCKE PK (NEEDLE) ×2 IMPLANT
NEEDLE 22X1 1/2 (OR ONLY) (NEEDLE) IMPLANT
NEEDLE HYPO 25X1 1.5 SAFETY (NEEDLE) IMPLANT
NEEDLE SPNL 18GX3.5 QUINCKE PK (NEEDLE) ×4 IMPLANT
NS IRRIG 1000ML POUR BTL (IV SOLUTION) ×2 IMPLANT
PACK UNIVERSAL I (CUSTOM PROCEDURE TRAY) ×1 IMPLANT
PAD ARMBOARD 7.5X6 YLW CONV (MISCELLANEOUS) ×4 IMPLANT
POSITIONER HEAD PRONE TRACH (MISCELLANEOUS) ×2 IMPLANT
SUT MNCRL AB 4-0 PS2 18 (SUTURE) ×2 IMPLANT
SYR BULB IRRIGATION 50ML (SYRINGE) ×2 IMPLANT
SYR CONTROL 10ML LL (SYRINGE) ×2 IMPLANT
TAPE CLOTH SURG 4X10 WHT LF (GAUZE/BANDAGES/DRESSINGS) ×1 IMPLANT
TOWEL OR 17X24 6PK STRL BLUE (TOWEL DISPOSABLE) ×2 IMPLANT
TOWEL OR 17X26 10 PK STRL BLUE (TOWEL DISPOSABLE) ×2 IMPLANT
TRAY KYPHOPAK 15/3 ONESTEP 1ST (MISCELLANEOUS) ×1 IMPLANT
TRAY KYPHOPAK 20/3 ONESTEP 1ST (MISCELLANEOUS) IMPLANT

## 2018-03-08 NOTE — Anesthesia Preprocedure Evaluation (Signed)
Anesthesia Evaluation  Patient identified by MRN, date of birth, ID band Patient awake    Reviewed: Allergy & Precautions, NPO status , Patient's Chart, lab work & pertinent test results  Airway Mallampati: II  TM Distance: >3 FB Neck ROM: Full    Dental no notable dental hx.    Pulmonary neg pulmonary ROS,    Pulmonary exam normal breath sounds clear to auscultation       Cardiovascular hypertension, Pt. on medications Normal cardiovascular exam+ dysrhythmias Atrial Fibrillation  Rhythm:Regular Rate:Normal     Neuro/Psych negative neurological ROS  negative psych ROS   GI/Hepatic negative GI ROS, Neg liver ROS,   Endo/Other  negative endocrine ROS  Renal/GU negative Renal ROS  negative genitourinary   Musculoskeletal negative musculoskeletal ROS (+)   Abdominal   Peds negative pediatric ROS (+)  Hematology negative hematology ROS (+)   Anesthesia Other Findings   Reproductive/Obstetrics negative OB ROS                             Anesthesia Physical  Anesthesia Plan  ASA: II  Anesthesia Plan: General   Post-op Pain Management:    Induction: Intravenous  PONV Risk Score and Plan: 3 and Ondansetron, Dexamethasone, Midazolam and Treatment may vary due to age or medical condition  Airway Management Planned: Oral ETT  Additional Equipment:   Intra-op Plan:   Post-operative Plan: Extubation in OR  Informed Consent: I have reviewed the patients History and Physical, chart, labs and discussed the procedure including the risks, benefits and alternatives for the proposed anesthesia with the patient or authorized representative who has indicated his/her understanding and acceptance.   Dental advisory given  Plan Discussed with: CRNA  Anesthesia Plan Comments:         Anesthesia Quick Evaluation

## 2018-03-08 NOTE — Anesthesia Procedure Notes (Signed)
Procedure Name: Intubation Date/Time: 03/08/2018 12:14 PM Performed by: Raenette Rover, CRNA Pre-anesthesia Checklist: Patient identified, Emergency Drugs available, Suction available and Patient being monitored Patient Re-evaluated:Patient Re-evaluated prior to induction Oxygen Delivery Method: Circle system utilized Preoxygenation: Pre-oxygenation with 100% oxygen Induction Type: IV induction Ventilation: Mask ventilation without difficulty Laryngoscope Size: Mac and 3 Grade View: Grade I Tube type: Oral Tube size: 7.0 mm Number of attempts: 1 Airway Equipment and Method: Stylet Placement Confirmation: ETT inserted through vocal cords under direct vision,  positive ETCO2,  CO2 detector and breath sounds checked- equal and bilateral Secured at: 21 cm Tube secured with: Tape Dental Injury: Teeth and Oropharynx as per pre-operative assessment

## 2018-03-08 NOTE — Discharge Instructions (Signed)

## 2018-03-08 NOTE — H&P (Signed)
PREOPERATIVE H&P  Chief Complaint: Mid back pain  HPI: [name] is a 84 y.o. female who presents with ongoing pain in the mid back  MRI reveals a compression fracture at T12  Patient has failed multiple forms of conservative care and continues to have pain (see office notes for additional details regarding the patient's full course of treatment)  Past Medical History:  Diagnosis Date  . Anemia   . Arthritis    "back" (01/16/2018)  . Atrial fibrillation ([place])   . Cancer [facility])    low grade papillary, most recent recurrence 2011,  Dr Karsten Ro  . 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 History of blood transfusion 01/15/2018   S/P OR  . Hypertension   . Hyponatremia 01/26/12   Na+ 131  . Macular degeneration, bilateral    "were wet; dry now" (01/16/2018)  . Other abnormal glucose 06/14/12   Fasting blood glucose 128; A1c 5.8% on 12/15/11   Past Surgical History:  Procedure Laterality Date  . ABDOMINAL HYSTERECTOMY  1970s   with oopherectomy for incidenctal cyst (no PMH of abnormal PAP)  . APPENDECTOMY     1970s  . BLADDER TUMOR EXCISION  2011 & 2013   Dr Karsten Ro  . CATARACT EXTRACTION, BILATERAL  2014   Dr Herbert Deaner  . COLONOSCOPY     negative; Dr Olevia Perches  . CYSTOSCOPY      multiple since tumor excision;Dr Ottelin  . DILATION AND CURETTAGE OF UTERUS    . FRACTURE SURGERY    . INTRAMEDULLARY (IM) NAIL INTERTROCHANTERIC Right 01/15/2018   Procedure: INTRAMEDULLARY (IM) NAIL INTERTROCHANTRIC;  Surgeon: Melrose Nakayama, MD;  Location: [place];  Service: Orthopedics;  Laterality: Right;  . TONSILLECTOMY     Social History   Socioeconomic History  . Marital status: Widowed    Spouse name: Not on file  . Number of children: 2  . Years of education: Not on file  . Highest education level: Not on file  Occupational History  . Occupation: Retired  Scientific laboratory technician  . Financial resource strain: Not on file  . Food insecurity:    Worry: Not on file   Inability: Not on file  . Transportation needs:    Medical: Not on file    Non-medical: Not on file  Tobacco Use  . Smoking status: Never Smoker  . Smokeless tobacco: Never Used  Substance and Sexual Activity  . Alcohol use: Never    Alcohol/week: 0.0 standard drinks    Frequency: Never  . Drug use: Never  . Sexual activity: Not Currently  Lifestyle  . Physical activity:    Days per week: Not on file    Minutes per session: Not on file  . Stress: Not on file  Relationships  . Social connections:    Talks on phone: Not on file    Gets together: Not on file    Attends religious service: Not on file    Active member of club or organization: Not on file    Attends meetings of clubs or organizations: Not on file    Relationship status: Not on file  Other Topics Concern  . Not on file  Social History Narrative  . Not on file   Family History  Problem Relation Age of Onset  . Stroke Mother 26  . Heart attack Father 36  . Coronary artery disease Brother        S/P CBAG ? @ 60  . Diabetes Brother   .  Kidney disease Neg Hx   . Cancer Neg Hx   . Colon cancer Neg Hx   . Colon polyps Neg Hx    No Known Allergies Prior to Admission medications   Medication Sig Start Date End Date Taking? Authorizing Provider  acetaminophen (TYLENOL) 500 MG tablet Take 500 mg by mouth every 6 (six) hours as needed for moderate pain.   Yes [provider]  amLODipine (NORVASC) 5 MG tablet 1 tablet daily for blood pressure 01/17/18  Yes Emokpae, Courage, MD  aspirin EC 325 MG EC tablet Take 1 tablet (325 mg total) by mouth 2 (two) times daily at 8 am and 10 pm. Patient taking differently: Take 81 mg by mouth 2 (two) times daily at 8 am and 10 pm.  01/17/18  Yes Nida, Mitzi Hansen, PA-C  Bilberry, Vaccinium myrtillus, (BILBERRY PO) Take 1 capsule by mouth daily.    Yes [provider]  calcium carbonate (OS-CAL) 600 MG TABS tablet Take 600 mg by mouth 2 (two) times daily.    Yes [provider]  Ergocalciferol (VITAMIN D2) 2000 units TABS Take 2,000 Units by mouth daily.   Yes [provider]  HYDROcodone-acetaminophen (NORCO/VICODIN) 5-325 MG tablet Take 1-2 tablets by mouth every 6 (six) hours as needed for moderate pain or severe pain. Patient taking differently: Take 1 tablet by mouth every 6 (six) hours as needed for moderate pain or severe pain.  01/17/18  Yes Loni Dolly, PA-C  metoprolol tartrate (LOPRESSOR) 25 MG tablet Take 1 tablet (25 mg total) by mouth 2 (two) times daily. 01/17/18  Yes Emokpae, Courage, MD  multivitamin-lutein (OCUVITE-LUTEIN) CAPS capsule Take 1 capsule by mouth daily.   Yes [provider]  Omega-3 Fatty Acids (FISH OIL) 1000 MG CAPS Take 100 mg by mouth daily.    Yes [provider]  ondansetron (ZOFRAN ODT) 4 MG disintegrating tablet Take 1 tablet (4 mg total) by mouth every 8 (eight) hours as needed for nausea or vomiting. 01/17/18  Yes Emokpae, Courage, MD  tiZANidine (ZANAFLEX) 2 MG tablet Take 2 mg by mouth every 8 (eight) hours as needed for muscle spasms. 02/06/18  Yes [provider]  tretinoin (RETIN-A) 0.05 % cream Apply 1 application topically at bedtime.   Yes [provider]  pantoprazole (PROTONIX) 40 MG tablet Take 1 tablet (40 mg total) by mouth daily. Patient not taking: Reported on 03/02/2018 01/17/18 01/17/19  Roxan Hockey, MD  senna-docusate (SENOKOT-S) 8.6-50 MG tablet Take 2 tablets by mouth at bedtime. Patient not taking: Reported on 03/02/2018 01/17/18 01/17/19  Roxan Hockey, MD     All other systems have been reviewed and were otherwise negative with the exception of those mentioned in the HPI and as above.  Physical Exam: There were no vitals filed for this visit.  There is no height or weight on file to calculate BMI.  General: Alert, no acute distress Cardiovascular: No pedal edema Respiratory: No cyanosis, no use of accessory musculature Skin: No lesions in the area  of chief complaint Neurologic: Sensation intact distally Psychiatric: Patient is competent for consent with normal mood and affect Lymphatic: No axillary or cervical lymphadenopathy  MUSCULOSKELETAL: + TTP at T12  Assessment/Plan: BACK PAIN Plan for Procedure(s): THORACIC 12 KYPHOPLASTY   Sinclair Ship, MD 03/08/2018 6:41 AM

## 2018-03-08 NOTE — Anesthesia Postprocedure Evaluation (Signed)
Anesthesia Post Note  Patient: [name]  Procedure(s) Performed: THORACIC 12 KYPHOPLASTY (N/A )     Patient location during evaluation: PACU Anesthesia Type: General Level of consciousness: awake and alert Pain management: pain level controlled Vital Signs Assessment: post-procedure vital signs reviewed and stable Respiratory status: spontaneous breathing, nonlabored ventilation and respiratory function stable Cardiovascular status: blood pressure returned to baseline and stable Postop Assessment: no apparent nausea or vomiting Anesthetic complications: no    Last Vitals:  Vitals:   03/08/18 1351 03/08/18 1406  BP:  (!) 169/91  Pulse: 96 95  Resp: 10 16  Temp:  (!) 36.3 C  SpO2: 97% 99%    Last Pain:  Vitals:   03/08/18 1406  TempSrc:   PainSc: 2                  Lynda Rainwater

## 2018-03-08 NOTE — Op Note (Signed)
NAME:  Channon Brougher          MEDICAL RECORD NO.:  [id number]  PHYSICIAN:  Phylliss Bob, MD           DATE OF BIRTH: 17-Jul-1930  DATE OF PROCEDURE:  03/08/2018                              OPERATIVE REPORT   PREOPERATIVE DIAGNOSIS:  T12 compression fracture.  POSTOPERATIVE DIAGNOSIS:  T12 compression fracture.  PROCEDURE:  T12 kyphoplasty.  SURGEON:  Phylliss Bob, MD.  ASSISTANTPricilla Holm, PA-C.  ANESTHESIA:  General endotracheal anesthesia.  COMPLICATIONS:  None.  DISPOSITION:  Stable.  ESTIMATED BLOOD LOSS:  Minimal.  INDICATIONS FOR SURGERY:  Briefly, Ms. Hinsley is a very pleasant 84- year-old female, who did have an onset of pain in her low back.   Her pain was rather severe.  The patient's imaging studies did clearly reveal a T12 compression fracture. We did attempt a trial of nonoperative treatment, but the patient continued to feel rather debilitated as a result of her ongoing pain.  Given her ongoing pain and dysfunction, we did discuss proceeding with the procedure noted above.  I did fully discuss the procedure with the patient, and she did wish to proceed.  OPERATIVE DETAILS:  On 03/08/2018, the patient was brought to surgery and general endotracheal anesthesia was administered.  The patient was placed prone on a well-padded flat Jackson bed with gel rolls placed under the patient's chest and hips.  Antibiotics were given.  AP and lateral fluoroscopy was brought into the field.  The T12 pedicles were marked out in the usual fashion.  After a time-out procedure was performed, I did advance Jamshidis across the T12 pedicles on the right and left sides.  I then drilled through the Jamshidis.  I then inserted kyphoplasty balloons and I was able to inflate the balloons with approximately 5cc of contrast.  Partial restoration of the superior endplate was noted.  At this point, after the cement was mixed, a total of approximately 5cc of cement was injected,  half on the right, and half on the left.  Excellent interdigitation of cement into the superior  endplate fracture fragment was identified. There was no abnormal extravasation of cement identified.  The cement was then allowed to [facility], after which point the Jamshidis were removed.  The wound  was then irrigated and closed using 4-0  Monocryl.  Bacitracin and a sterile dressing were applied.  The patient was then awoken from general endotracheal anesthesia and transferred to Recovery in stable condition.     Phylliss Bob, MD

## 2018-03-08 NOTE — Transfer of Care (Signed)
Immediate Anesthesia Transfer of Care Note  Patient: [name]  Procedure(s) Performed: THORACIC 12 KYPHOPLASTY (N/A )  Patient Location: PACU  Anesthesia Type:General  Level of Consciousness: drowsy and patient cooperative  Airway & Oxygen Therapy: Patient Spontanous Breathing and Patient connected to face mask oxygen  Post-op Assessment: Report given to RN and Post -op Vital signs reviewed and stable  Post vital signs: Reviewed and stable  Last Vitals:  Vitals Value Taken Time  BP 177/74 03/08/2018  1:41 PM  Temp    Pulse 66 03/08/2018  1:43 PM  Resp 16 03/08/2018  1:43 PM  SpO2 100 % 03/08/2018  1:43 PM  Vitals shown include unvalidated device data.  Last Pain:  Vitals:   03/08/18 0803  TempSrc: Oral         Complications: No apparent anesthesia complications

## 2018-03-09 ENCOUNTER — Encounter (HOSPITAL_COMMUNITY): Payer: Self-pay | Admitting: Orthopedic Surgery

## 2018-03-13 DIAGNOSIS — S72141D Displaced intertrochanteric fracture of right femur, subsequent encounter for closed fracture with routine healing: Secondary | ICD-10-CM | POA: Diagnosis not present

## 2018-03-13 DIAGNOSIS — Z96641 Presence of right artificial hip joint: Secondary | ICD-10-CM | POA: Diagnosis not present

## 2018-03-13 DIAGNOSIS — M810 Age-related osteoporosis without current pathological fracture: Secondary | ICD-10-CM | POA: Diagnosis not present

## 2018-03-13 DIAGNOSIS — M5136 Other intervertebral disc degeneration, lumbar region: Secondary | ICD-10-CM | POA: Diagnosis not present

## 2018-03-13 DIAGNOSIS — Z8551 Personal history of malignant neoplasm of bladder: Secondary | ICD-10-CM | POA: Diagnosis not present

## 2018-03-13 DIAGNOSIS — Q249 Congenital malformation of heart, unspecified: Secondary | ICD-10-CM | POA: Diagnosis not present

## 2018-03-13 DIAGNOSIS — M4850XS Collapsed vertebra, not elsewhere classified, site unspecified, sequela of fracture: Secondary | ICD-10-CM | POA: Diagnosis not present

## 2018-03-13 DIAGNOSIS — W010XXD Fall on same level from slipping, tripping and stumbling without subsequent striking against object, subsequent encounter: Secondary | ICD-10-CM | POA: Diagnosis not present

## 2018-03-13 DIAGNOSIS — M8580 Other specified disorders of bone density and structure, unspecified site: Secondary | ICD-10-CM | POA: Diagnosis not present

## 2018-03-13 DIAGNOSIS — I480 Paroxysmal atrial fibrillation: Secondary | ICD-10-CM | POA: Diagnosis not present

## 2018-03-13 DIAGNOSIS — D62 Acute posthemorrhagic anemia: Secondary | ICD-10-CM | POA: Diagnosis not present

## 2018-03-13 DIAGNOSIS — I10 Essential (primary) hypertension: Secondary | ICD-10-CM | POA: Diagnosis not present

## 2018-03-13 DIAGNOSIS — M479 Spondylosis, unspecified: Secondary | ICD-10-CM | POA: Diagnosis not present

## 2018-03-13 DIAGNOSIS — Z7982 Long term (current) use of aspirin: Secondary | ICD-10-CM | POA: Diagnosis not present

## 2018-03-15 DIAGNOSIS — I10 Essential (primary) hypertension: Secondary | ICD-10-CM | POA: Diagnosis not present

## 2018-03-15 DIAGNOSIS — M479 Spondylosis, unspecified: Secondary | ICD-10-CM | POA: Diagnosis not present

## 2018-03-15 DIAGNOSIS — M810 Age-related osteoporosis without current pathological fracture: Secondary | ICD-10-CM | POA: Diagnosis not present

## 2018-03-15 DIAGNOSIS — Z96641 Presence of right artificial hip joint: Secondary | ICD-10-CM | POA: Diagnosis not present

## 2018-03-15 DIAGNOSIS — M8580 Other specified disorders of bone density and structure, unspecified site: Secondary | ICD-10-CM | POA: Diagnosis not present

## 2018-03-15 DIAGNOSIS — M5136 Other intervertebral disc degeneration, lumbar region: Secondary | ICD-10-CM | POA: Diagnosis not present

## 2018-03-15 DIAGNOSIS — Z8551 Personal history of malignant neoplasm of bladder: Secondary | ICD-10-CM | POA: Diagnosis not present

## 2018-03-15 DIAGNOSIS — D62 Acute posthemorrhagic anemia: Secondary | ICD-10-CM | POA: Diagnosis not present

## 2018-03-15 DIAGNOSIS — S72141D Displaced intertrochanteric fracture of right femur, subsequent encounter for closed fracture with routine healing: Secondary | ICD-10-CM | POA: Diagnosis not present

## 2018-03-15 DIAGNOSIS — Q249 Congenital malformation of heart, unspecified: Secondary | ICD-10-CM | POA: Diagnosis not present

## 2018-03-15 DIAGNOSIS — Z7982 Long term (current) use of aspirin: Secondary | ICD-10-CM | POA: Diagnosis not present

## 2018-03-15 DIAGNOSIS — I480 Paroxysmal atrial fibrillation: Secondary | ICD-10-CM | POA: Diagnosis not present

## 2018-03-15 DIAGNOSIS — W010XXD Fall on same level from slipping, tripping and stumbling without subsequent striking against object, subsequent encounter: Secondary | ICD-10-CM | POA: Diagnosis not present

## 2018-03-20 DIAGNOSIS — S72141D Displaced intertrochanteric fracture of right femur, subsequent encounter for closed fracture with routine healing: Secondary | ICD-10-CM | POA: Diagnosis not present

## 2018-03-20 DIAGNOSIS — I10 Essential (primary) hypertension: Secondary | ICD-10-CM | POA: Diagnosis not present

## 2018-03-20 DIAGNOSIS — Z96641 Presence of right artificial hip joint: Secondary | ICD-10-CM | POA: Diagnosis not present

## 2018-03-20 DIAGNOSIS — Z7982 Long term (current) use of aspirin: Secondary | ICD-10-CM | POA: Diagnosis not present

## 2018-03-20 DIAGNOSIS — Q249 Congenital malformation of heart, unspecified: Secondary | ICD-10-CM | POA: Diagnosis not present

## 2018-03-20 DIAGNOSIS — M810 Age-related osteoporosis without current pathological fracture: Secondary | ICD-10-CM | POA: Diagnosis not present

## 2018-03-20 DIAGNOSIS — M5136 Other intervertebral disc degeneration, lumbar region: Secondary | ICD-10-CM | POA: Diagnosis not present

## 2018-03-20 DIAGNOSIS — M479 Spondylosis, unspecified: Secondary | ICD-10-CM | POA: Diagnosis not present

## 2018-03-20 DIAGNOSIS — D62 Acute posthemorrhagic anemia: Secondary | ICD-10-CM | POA: Diagnosis not present

## 2018-03-20 DIAGNOSIS — Z8551 Personal history of malignant neoplasm of bladder: Secondary | ICD-10-CM | POA: Diagnosis not present

## 2018-03-20 DIAGNOSIS — I480 Paroxysmal atrial fibrillation: Secondary | ICD-10-CM | POA: Diagnosis not present

## 2018-03-20 DIAGNOSIS — M8580 Other specified disorders of bone density and structure, unspecified site: Secondary | ICD-10-CM | POA: Diagnosis not present

## 2018-03-20 DIAGNOSIS — W010XXD Fall on same level from slipping, tripping and stumbling without subsequent striking against object, subsequent encounter: Secondary | ICD-10-CM | POA: Diagnosis not present

## 2018-03-21 DIAGNOSIS — M4696 Unspecified inflammatory spondylopathy, lumbar region: Secondary | ICD-10-CM | POA: Diagnosis not present

## 2018-03-22 DIAGNOSIS — M810 Age-related osteoporosis without current pathological fracture: Secondary | ICD-10-CM | POA: Diagnosis not present

## 2018-03-22 DIAGNOSIS — M4850XS Collapsed vertebra, not elsewhere classified, site unspecified, sequela of fracture: Secondary | ICD-10-CM | POA: Diagnosis not present

## 2018-03-22 DIAGNOSIS — I480 Paroxysmal atrial fibrillation: Secondary | ICD-10-CM | POA: Diagnosis not present

## 2018-03-22 DIAGNOSIS — Q249 Congenital malformation of heart, unspecified: Secondary | ICD-10-CM | POA: Diagnosis not present

## 2018-03-22 DIAGNOSIS — Z8551 Personal history of malignant neoplasm of bladder: Secondary | ICD-10-CM | POA: Diagnosis not present

## 2018-03-22 DIAGNOSIS — Z96641 Presence of right artificial hip joint: Secondary | ICD-10-CM | POA: Diagnosis not present

## 2018-03-22 DIAGNOSIS — W010XXD Fall on same level from slipping, tripping and stumbling without subsequent striking against object, subsequent encounter: Secondary | ICD-10-CM | POA: Diagnosis not present

## 2018-03-22 DIAGNOSIS — D62 Acute posthemorrhagic anemia: Secondary | ICD-10-CM | POA: Diagnosis not present

## 2018-03-22 DIAGNOSIS — M8580 Other specified disorders of bone density and structure, unspecified site: Secondary | ICD-10-CM | POA: Diagnosis not present

## 2018-03-22 DIAGNOSIS — M5136 Other intervertebral disc degeneration, lumbar region: Secondary | ICD-10-CM | POA: Diagnosis not present

## 2018-03-22 DIAGNOSIS — S72141D Displaced intertrochanteric fracture of right femur, subsequent encounter for closed fracture with routine healing: Secondary | ICD-10-CM | POA: Diagnosis not present

## 2018-03-22 DIAGNOSIS — Z7982 Long term (current) use of aspirin: Secondary | ICD-10-CM | POA: Diagnosis not present

## 2018-03-22 DIAGNOSIS — I10 Essential (primary) hypertension: Secondary | ICD-10-CM | POA: Diagnosis not present

## 2018-03-22 DIAGNOSIS — M479 Spondylosis, unspecified: Secondary | ICD-10-CM | POA: Diagnosis not present

## 2018-03-26 ENCOUNTER — Ambulatory Visit: Payer: Medicare Other | Admitting: Internal Medicine

## 2018-03-27 DIAGNOSIS — M8580 Other specified disorders of bone density and structure, unspecified site: Secondary | ICD-10-CM | POA: Diagnosis not present

## 2018-03-27 DIAGNOSIS — W010XXD Fall on same level from slipping, tripping and stumbling without subsequent striking against object, subsequent encounter: Secondary | ICD-10-CM | POA: Diagnosis not present

## 2018-03-27 DIAGNOSIS — Q249 Congenital malformation of heart, unspecified: Secondary | ICD-10-CM | POA: Diagnosis not present

## 2018-03-27 DIAGNOSIS — M4850XS Collapsed vertebra, not elsewhere classified, site unspecified, sequela of fracture: Secondary | ICD-10-CM | POA: Diagnosis not present

## 2018-03-27 DIAGNOSIS — M810 Age-related osteoporosis without current pathological fracture: Secondary | ICD-10-CM | POA: Diagnosis not present

## 2018-03-27 DIAGNOSIS — M47816 Spondylosis without myelopathy or radiculopathy, lumbar region: Secondary | ICD-10-CM | POA: Diagnosis not present

## 2018-03-27 DIAGNOSIS — I10 Essential (primary) hypertension: Secondary | ICD-10-CM | POA: Diagnosis not present

## 2018-03-27 DIAGNOSIS — D62 Acute posthemorrhagic anemia: Secondary | ICD-10-CM | POA: Diagnosis not present

## 2018-03-27 DIAGNOSIS — I480 Paroxysmal atrial fibrillation: Secondary | ICD-10-CM | POA: Diagnosis not present

## 2018-03-27 DIAGNOSIS — Z8551 Personal history of malignant neoplasm of bladder: Secondary | ICD-10-CM | POA: Diagnosis not present

## 2018-03-27 DIAGNOSIS — Z96641 Presence of right artificial hip joint: Secondary | ICD-10-CM | POA: Diagnosis not present

## 2018-03-27 DIAGNOSIS — S72141D Displaced intertrochanteric fracture of right femur, subsequent encounter for closed fracture with routine healing: Secondary | ICD-10-CM | POA: Diagnosis not present

## 2018-03-27 DIAGNOSIS — Z7982 Long term (current) use of aspirin: Secondary | ICD-10-CM | POA: Diagnosis not present

## 2018-03-27 DIAGNOSIS — M5136 Other intervertebral disc degeneration, lumbar region: Secondary | ICD-10-CM | POA: Diagnosis not present

## 2018-03-27 DIAGNOSIS — M479 Spondylosis, unspecified: Secondary | ICD-10-CM | POA: Diagnosis not present

## 2018-03-29 ENCOUNTER — Encounter (HOSPITAL_COMMUNITY): Payer: Self-pay | Admitting: Emergency Medicine

## 2018-03-29 ENCOUNTER — Emergency Department (HOSPITAL_COMMUNITY): Payer: Medicare Other

## 2018-03-29 ENCOUNTER — Telehealth (HOSPITAL_COMMUNITY): Payer: Self-pay | Admitting: *Deleted

## 2018-03-29 ENCOUNTER — Emergency Department (HOSPITAL_COMMUNITY)
Admission: EM | Admit: 2018-03-29 | Discharge: 2018-03-29 | Disposition: A | Discharge status: Home / Self Care | Payer: Medicare Other | Attending: Emergency Medicine | Admitting: Emergency Medicine

## 2018-03-29 DIAGNOSIS — I480 Paroxysmal atrial fibrillation: Secondary | ICD-10-CM | POA: Diagnosis not present

## 2018-03-29 DIAGNOSIS — Z85850 Personal history of malignant neoplasm of thyroid: Secondary | ICD-10-CM | POA: Insufficient documentation

## 2018-03-29 DIAGNOSIS — R002 Palpitations: Secondary | ICD-10-CM

## 2018-03-29 DIAGNOSIS — Z96641 Presence of right artificial hip joint: Secondary | ICD-10-CM | POA: Insufficient documentation

## 2018-03-29 DIAGNOSIS — D090 Carcinoma in situ of bladder: Secondary | ICD-10-CM | POA: Insufficient documentation

## 2018-03-29 DIAGNOSIS — I10 Essential (primary) hypertension: Secondary | ICD-10-CM | POA: Diagnosis not present

## 2018-03-29 DIAGNOSIS — Z79899 Other long term (current) drug therapy: Secondary | ICD-10-CM | POA: Insufficient documentation

## 2018-03-29 DIAGNOSIS — Z7982 Long term (current) use of aspirin: Secondary | ICD-10-CM | POA: Diagnosis not present

## 2018-03-29 DIAGNOSIS — J90 Pleural effusion, not elsewhere classified: Secondary | ICD-10-CM | POA: Diagnosis not present

## 2018-03-29 LAB — COMPREHENSIVE METABOLIC PANEL
ALBUMIN: 3.8 g/dL (ref 3.5–5.0)
ALK PHOS: 161 U/L — AB (ref 38–126)
ALT: 13 U/L (ref 0–44)
AST: 21 U/L (ref 15–41)
Anion gap: 10 (ref 5–15)
BUN: 11 mg/dL (ref 8–23)
CALCIUM: 9 mg/dL (ref 8.9–10.3)
CO2: 30 mmol/L (ref 22–32)
CREATININE: 0.56 mg/dL (ref 0.44–1.00)
Chloride: 102 mmol/L (ref 98–111)
GFR calc Af Amer: >60 mL/min (ref >60)
GFR calc non Af Amer: >60 mL/min (ref >60)
GLUCOSE: 125 mg/dL — AB (ref 70–99)
Potassium: 3.3 mmol/L — ABNORMAL LOW (ref 3.5–5.1)
SODIUM: 142 mmol/L (ref 135–145)
Total Bilirubin: 0.6 mg/dL (ref 0.3–1.2)
Total Protein: 6.9 g/dL (ref 6.5–8.1)

## 2018-03-29 LAB — CBC WITH DIFFERENTIAL/PLATELET
Basophils Absolute: 0 10*3/uL (ref 0.0–0.1)
Basophils Relative: 0 %
EOS ABS: 0.1 10*3/uL (ref 0.0–0.7)
Eosinophils Relative: 1 %
HCT: 40.7 % (ref 36.0–46.0)
HEMOGLOBIN: 13.1 g/dL (ref 12.0–15.0)
LYMPHS ABS: 0.9 10*3/uL (ref 0.7–4.0)
Lymphocytes Relative: 12 %
MCH: 30.5 pg (ref 26.0–34.0)
MCHC: 32.2 g/dL (ref 30.0–36.0)
MCV: 94.9 fL (ref 78.0–100.0)
Monocytes Absolute: 0.6 10*3/uL (ref 0.1–1.0)
Monocytes Relative: 9 %
NEUTROS PCT: 78 %
Neutro Abs: 5.4 10*3/uL (ref 1.7–7.7)
Platelets: 406 10*3/uL — ABNORMAL HIGH (ref 150–400)
RBC: 4.29 MIL/uL (ref 3.87–5.11)
RDW: 13.9 % (ref 11.5–15.5)
WBC: 7 10*3/uL (ref 4.0–10.5)

## 2018-03-29 LAB — POCT I-STAT TROPONIN I: Troponin i, poc: 0.04 ng/mL (ref 0.00–0.08)

## 2018-03-29 LAB — BRAIN NATRIURETIC PEPTIDE: B Natriuretic Peptide: 105.6 pg/mL — ABNORMAL HIGH (ref 0.0–100.0)

## 2018-03-29 MED ORDER — APIXABAN 2.5 MG PO TABS: 2.5000 mg | ORAL_TABLET | Freq: Two times a day (BID) | ORAL | 0 refills | Status: DC

## 2018-03-29 MED ORDER — ELIQUIS 5 MG VTE STARTER PACK: ORAL_TABLET | ORAL | 0 refills | Status: DC

## 2018-03-29 NOTE — Discharge Instructions (Addendum)
Take eliquis as prescribed. Stop Aspirin for now.   See your cardiologist for follow up   Continue metoprolol as prescribed   Return to ER if you have palpitations, chest pain, trouble breathing    Information on my medicine - ELIQUIS (apixaban)  Why was Eliquis prescribed for you? Eliquis was prescribed for you to reduce the risk of a blood clot forming that can cause a stroke if you have a medical condition called atrial fibrillation (a type of irregular heartbeat).  What do You need to know about Eliquis ? Take your Eliquis TWICE DAILY - one tablet in the morning and one tablet in the evening with or without food. If you have difficulty swallowing the tablet whole please discuss with your pharmacist how to take the medication safely.  Take Eliquis exactly as prescribed by your doctor and DO NOT stop taking Eliquis without talking to the doctor who prescribed the medication.  Stopping may increase your risk of developing a stroke.  Refill your prescription before you run out.  After discharge, you should have regular check-up appointments with your healthcare provider that is prescribing your Eliquis.  In the future your dose may need to be changed if your kidney function or weight changes by a significant amount or as you get older.  What do you do if you miss a dose? If you miss a dose, take it as soon as you remember on the same day and resume taking twice daily.  Do not take more than one dose of ELIQUIS at the same time to make up a missed dose.  Important Safety Information A possible side effect of Eliquis is bleeding. You should call your healthcare provider right away if you experience any of the following: ? Bleeding from an injury or your nose that does not stop. ? Unusual colored urine (red or dark brown) or unusual colored stools (red or black). ? Unusual bruising for unknown reasons. ? A serious fall or if you hit your head (even if there is no bleeding).  Some  medicines may interact with Eliquis and might increase your risk of bleeding or clotting while on Eliquis. To help avoid this, consult your healthcare provider or pharmacist prior to using any new prescription or non-prescription medications, including herbals, vitamins, non-steroidal anti-inflammatory drugs (NSAIDs) and supplements.  This website has more information on Eliquis (apixaban): http://www.eliquis.com/eliquis/home

## 2018-03-29 NOTE — Telephone Encounter (Signed)
I attempted to contact pt and make follow up appt with afib clinic since ED visit and start on Eliquis.  Pt healthcare POA, Catheryn Slifer stated that she is feeling better, has been taking the Eliquis but that he would discuss this with her and if they decide to make appt he will call back.  He stated that she used to see [facility] and although he knows that he is no longer practicing, may want to re-establish her over there at the [address]

## 2018-03-29 NOTE — ED Provider Notes (Signed)
[facility] DEPT Provider Note   CSN: [id number] Arrival date & time: 03/29/18  1102     History   Chief Complaint Chief Complaint  Patient presents with  . Tachycardia    HPI [name] is a 84 y.o. female history of A. fib not on anticoagulation, hypertension, recent hip replacement here presenting with palpitations, possible rapid A. fib.  Patient states that she felt bad yesterday and had some palpitations.  His son is a Conservation officer, historic buildings and took her heart rate this morning and was 150 and appears irregular.  She was given metoprolol 50 mg prior to arrival and now feels better.  The son told me that patient has history of atrial fibrillation that was discovered incidentally at the doctor's office several years ago.  She is only on metoprolol and not on any blood thinners.  She also had right hip replacement several months ago and did not get started on blood thinners either.  Denies any shortness of breath or leg swelling or leg pain or history of blood clots.  Denies any previous stents in her heart.   The history is provided by the patient.    Past Medical History:  Diagnosis Date  . Anemia   . Arthritis    "back" (01/16/2018)  . Atrial fibrillation ([place])   . Cancer [facility])    low grade papillary, most recent recurrence 2011,  Dr Karsten Ro  . 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 History of blood transfusion 01/15/2018   S/P OR  . Hypertension   . Hyponatremia 01/26/12   Na+ 131  . Macular degeneration, bilateral    "were wet; dry now" (01/16/2018)  . Other abnormal glucose 06/14/12   Fasting blood glucose 128; A1c 5.8% on 12/15/11    Patient Active Problem List   Diagnosis Date Noted  . Femur fracture, right ([place]) 01/14/2018  . Hip fracture ([place]) 01/14/2018  . Dysuria 10/12/2017  . Macular degeneration (senile) of retina 06/14/2010  . Vitamin D deficiency 03/10/2009  . Osteoporosis 03/10/2009  . NEOPLASM,  MALIGNANT, BLADDER, HX OF 03/10/2009  . 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  . Hyperglycemia 06/26/2008  . Essential hypertension 08/09/2007  . ATRIAL FIBRILLATION WITH RAPID VENTRICULAR RESPONSE 08/09/2007    Past Surgical History:  Procedure Laterality Date  . ABDOMINAL HYSTERECTOMY  1970s   with oopherectomy for incidenctal cyst (no PMH of abnormal PAP)  . APPENDECTOMY     1970s  . BLADDER TUMOR EXCISION  2011 & 2013   Dr Karsten Ro  . CATARACT EXTRACTION, BILATERAL  2014   Dr Herbert Deaner  . COLONOSCOPY     negative; Dr Olevia Perches  . CYSTOSCOPY      multiple since tumor excision;Dr Ottelin  . DILATION AND CURETTAGE OF UTERUS    . FRACTURE SURGERY    . INTRAMEDULLARY (IM) NAIL INTERTROCHANTERIC Right 01/15/2018   Procedure: INTRAMEDULLARY (IM) NAIL INTERTROCHANTRIC;  Surgeon: Melrose Nakayama, MD;  Location: [place];  Service: Orthopedics;  Laterality: Right;  . KYPHOPLASTY N/A 03/08/2018   Procedure: THORACIC 12 KYPHOPLASTY;  Surgeon: Phylliss Bob, MD;  Location: [place];  Service: Orthopedics;  Laterality: N/A;  . TONSILLECTOMY       OB History   None      Home Medications    Prior to Admission medications   Medication Sig Start Date End Date Taking? Authorizing Provider  acetaminophen (TYLENOL) 500 MG tablet Take 500 mg by mouth every 6 (six) hours as needed for moderate pain.  Yes [provider]  amLODipine (NORVASC) 5 MG tablet 1 tablet daily for blood pressure 01/17/18  Yes Emokpae, Courage, MD  aspirin EC 81 MG tablet Take 81 mg by mouth daily.   Yes [provider]  Bilberry, Vaccinium myrtillus, (BILBERRY PO) Take 1 capsule by mouth daily.    Yes [provider]  calcium carbonate (OS-CAL) 600 MG TABS tablet Take 600 mg by mouth 2 (two) times daily.    Yes [provider]  Ergocalciferol (VITAMIN D2) 2000 units TABS Take 2,000 Units by mouth daily.   Yes [provider]  metoprolol tartrate (LOPRESSOR) 25 MG tablet Take 1 tablet (25 mg  total) by mouth 2 (two) times daily. 01/17/18  Yes Emokpae, Courage, MD  multivitamin-lutein (OCUVITE-LUTEIN) CAPS capsule Take 1 capsule by mouth daily.   Yes [provider]  Omega-3 Fatty Acids (FISH OIL) 1000 MG CAPS Take 100 mg by mouth 2 (two) times daily.    Yes [provider]  traMADol (ULTRAM) 50 MG tablet Take 50 mg by mouth every 8 (eight) hours as needed for moderate pain.  03/21/18  Yes [provider]  tretinoin (RETIN-A) 0.05 % cream Apply 1 application topically 2 (two) times a week.    Yes [provider]  HYDROcodone-acetaminophen (NORCO/VICODIN) 5-325 MG tablet Take 1-2 tablets by mouth every 6 (six) hours as needed for moderate pain or severe pain. Patient not taking: Reported on 03/29/2018 01/17/18   Loni Dolly, PA-C  ondansetron (ZOFRAN ODT) 4 MG disintegrating tablet Take 1 tablet (4 mg total) by mouth every 8 (eight) hours as needed for nausea or vomiting. Patient not taking: Reported on 03/29/2018 01/17/18   Roxan Hockey, MD    Family History Family History  Problem Relation Age of Onset  . Stroke Mother 67  . Heart attack Father 52  . Coronary artery disease Brother        S/P CBAG ? @ 60  . Diabetes Brother   . Kidney disease Neg Hx   . Cancer Neg Hx   . Colon cancer Neg Hx   . Colon polyps Neg Hx     Social History Social History   Tobacco Use  . Smoking status: Never Smoker  . Smokeless tobacco: Never Used  Substance Use Topics  . Alcohol use: Never    Alcohol/week: 0.0 standard drinks    Frequency: Never  . Drug use: Never     Allergies   Patient has no known allergies.   Review of Systems Review of Systems  Cardiovascular: Positive for palpitations.  All other systems reviewed and are negative.    Physical Exam Updated Vital Signs BP (!) 169/80 (BP Location: Left Arm)   Pulse 74   Temp 98.6 F (37 C) (Oral)   Resp (!) 23   Ht 5\' 4"  (1.626 m)   SpO2 95%   BMI 17.34 kg/m   Physical Exam    Constitutional: She is oriented to person, place, and time. She appears well-developed and well-nourished.  HENT:  Head: Normocephalic.  Mouth/Throat: Oropharynx is clear and moist.  Eyes: Pupils are equal, round, and reactive to light. Conjunctivae and EOM are normal.  Neck: Normal range of motion. Neck supple.  Cardiovascular: Normal rate, regular rhythm and normal heart sounds.  Pulmonary/Chest: Effort normal and breath sounds normal. No stridor. No respiratory distress.  Abdominal: Soft. Bowel sounds are normal. She exhibits no distension. There is no tenderness.  Musculoskeletal: Normal range of motion. She exhibits no edema,  tenderness or deformity.  Neurological: She is alert and oriented to person, place, and time. No cranial nerve deficit. Coordination normal.  Skin: Skin is warm. Capillary refill takes less than 2 seconds.  Psychiatric: She has a normal mood and affect.  Nursing note and vitals reviewed.    ED Treatments / Results  Labs (all labs ordered are listed, but only abnormal results are displayed) Labs Reviewed  CBC WITH DIFFERENTIAL/PLATELET - Abnormal; Notable for the following components:      Result Value   Platelets 406 (*)    All other components within normal limits  COMPREHENSIVE METABOLIC PANEL - Abnormal; Notable for the following components:   Potassium 3.3 (*)    Glucose, Bld 125 (*)    Alkaline Phosphatase 161 (*)    All other components within normal limits  BRAIN NATRIURETIC PEPTIDE - Abnormal; Notable for the following components:   B Natriuretic Peptide 105.6 (*)    All other components within normal limits  I-STAT TROPONIN, ED  POCT I-STAT TROPONIN I    EKG EKG Interpretation  Date/Time:  Thursday March 29 2018 11:18:22 EDT Ventricular Rate:  79 PR Interval:    QRS Duration: 91 QT Interval:  433 QTC Calculation: 497 R Axis:   3 Text Interpretation:  Sinus rhythm LVH with secondary repolarization abnormality Anterior infarct,  old Baseline wander in lead(s) V6 No significant change since last tracing Confirmed by Wandra Arthurs [id number]) on 03/29/2018 12:26:15 PM   Radiology Dg Chest 2 View  Result Date: 03/29/2018 CLINICAL DATA:  Tachycardia. EXAM: CHEST - 2 VIEW COMPARISON:  01/14/2018 FINDINGS: Enlarged cardiac silhouette. Calcific atherosclerotic disease and tortuosity of the aorta. No evidence of pneumothorax. Hyperinflation of the lungs. Small bilateral pleural effusions versus pleural reflection. Stable appearance of compression fracture of T12, post vertebroplasty with subsequent mild spinal kyphosis. Soft tissues are grossly normal. IMPRESSION: Enlarged cardiac silhouette. Small bilateral pleural effusions. Hyperinflation of the lungs. Electronically Signed   By: Fidela Salisbury M.D.   On: 03/29/2018 11:41    Procedures Procedures (including critical care time)  Medications Ordered in ED Medications - No data to display   Initial Impression / Assessment and Plan / ED Course  I have reviewed the triage vital signs and the nursing notes.  Pertinent labs & imaging results that were available during my care of the patient were reviewed by me and considered in my medical decision making (see chart for details).     [name] is a 84 y.o. female here with palpitations. Has hx of afib and likely had paroxysmal afib. She is back in sinus rhythm. Her CHADVASC score is 4 and likely need anticoagulation. Her doctor had discussed starting eliquis previously. Will check CBC, CMP, trop. If labs unremarkable, will likely start eliquis. She is back in sinus rhythm.   1:35 PM CXR showed pleural effusion. BNP 100. Trop normal. Patient remained in sinus rhythm. Given that CHADVASC is 4, will start on eliquis. She has cardiology follow up.    Final Clinical Impressions(s) / ED Diagnoses   Final diagnoses:  None    ED Discharge Orders    None       Drenda Freeze, MD 03/29/18 1338

## 2018-03-29 NOTE — ED Triage Notes (Signed)
Pt reports that she couldn't sleep last night and today her son who is a PA came by and HR 150s and irregular. Pt had afib years ago but only takes daily ASA. Pt denies n/v/ dizziness or SOB or pains.

## 2018-04-03 DIAGNOSIS — M5136 Other intervertebral disc degeneration, lumbar region: Secondary | ICD-10-CM | POA: Diagnosis not present

## 2018-04-03 DIAGNOSIS — I10 Essential (primary) hypertension: Secondary | ICD-10-CM | POA: Diagnosis not present

## 2018-04-03 DIAGNOSIS — W010XXD Fall on same level from slipping, tripping and stumbling without subsequent striking against object, subsequent encounter: Secondary | ICD-10-CM | POA: Diagnosis not present

## 2018-04-03 DIAGNOSIS — Q249 Congenital malformation of heart, unspecified: Secondary | ICD-10-CM | POA: Diagnosis not present

## 2018-04-03 DIAGNOSIS — M4850XS Collapsed vertebra, not elsewhere classified, site unspecified, sequela of fracture: Secondary | ICD-10-CM | POA: Diagnosis not present

## 2018-04-03 DIAGNOSIS — Z96641 Presence of right artificial hip joint: Secondary | ICD-10-CM | POA: Diagnosis not present

## 2018-04-03 DIAGNOSIS — I480 Paroxysmal atrial fibrillation: Secondary | ICD-10-CM | POA: Diagnosis not present

## 2018-04-03 DIAGNOSIS — M810 Age-related osteoporosis without current pathological fracture: Secondary | ICD-10-CM | POA: Diagnosis not present

## 2018-04-03 DIAGNOSIS — M8580 Other specified disorders of bone density and structure, unspecified site: Secondary | ICD-10-CM | POA: Diagnosis not present

## 2018-04-03 DIAGNOSIS — Z7982 Long term (current) use of aspirin: Secondary | ICD-10-CM | POA: Diagnosis not present

## 2018-04-03 DIAGNOSIS — S72141D Displaced intertrochanteric fracture of right femur, subsequent encounter for closed fracture with routine healing: Secondary | ICD-10-CM | POA: Diagnosis not present

## 2018-04-03 DIAGNOSIS — D62 Acute posthemorrhagic anemia: Secondary | ICD-10-CM | POA: Diagnosis not present

## 2018-04-03 DIAGNOSIS — Z8551 Personal history of malignant neoplasm of bladder: Secondary | ICD-10-CM | POA: Diagnosis not present

## 2018-04-03 DIAGNOSIS — M479 Spondylosis, unspecified: Secondary | ICD-10-CM | POA: Diagnosis not present

## 2018-04-05 DIAGNOSIS — D62 Acute posthemorrhagic anemia: Secondary | ICD-10-CM | POA: Diagnosis not present

## 2018-04-05 DIAGNOSIS — Z961 Presence of intraocular lens: Secondary | ICD-10-CM | POA: Diagnosis not present

## 2018-04-05 DIAGNOSIS — M479 Spondylosis, unspecified: Secondary | ICD-10-CM | POA: Diagnosis not present

## 2018-04-05 DIAGNOSIS — I10 Essential (primary) hypertension: Secondary | ICD-10-CM | POA: Diagnosis not present

## 2018-04-05 DIAGNOSIS — Z8551 Personal history of malignant neoplasm of bladder: Secondary | ICD-10-CM | POA: Diagnosis not present

## 2018-04-05 DIAGNOSIS — H353231 Exudative age-related macular degeneration, bilateral, with active choroidal neovascularization: Secondary | ICD-10-CM | POA: Diagnosis not present

## 2018-04-05 DIAGNOSIS — Z96641 Presence of right artificial hip joint: Secondary | ICD-10-CM | POA: Diagnosis not present

## 2018-04-05 DIAGNOSIS — W010XXD Fall on same level from slipping, tripping and stumbling without subsequent striking against object, subsequent encounter: Secondary | ICD-10-CM | POA: Diagnosis not present

## 2018-04-05 DIAGNOSIS — I480 Paroxysmal atrial fibrillation: Secondary | ICD-10-CM | POA: Diagnosis not present

## 2018-04-05 DIAGNOSIS — M5136 Other intervertebral disc degeneration, lumbar region: Secondary | ICD-10-CM | POA: Diagnosis not present

## 2018-04-05 DIAGNOSIS — M8580 Other specified disorders of bone density and structure, unspecified site: Secondary | ICD-10-CM | POA: Diagnosis not present

## 2018-04-05 DIAGNOSIS — M810 Age-related osteoporosis without current pathological fracture: Secondary | ICD-10-CM | POA: Diagnosis not present

## 2018-04-05 DIAGNOSIS — Q249 Congenital malformation of heart, unspecified: Secondary | ICD-10-CM | POA: Diagnosis not present

## 2018-04-05 DIAGNOSIS — M4850XS Collapsed vertebra, not elsewhere classified, site unspecified, sequela of fracture: Secondary | ICD-10-CM | POA: Diagnosis not present

## 2018-04-05 DIAGNOSIS — H26491 Other secondary cataract, right eye: Secondary | ICD-10-CM | POA: Diagnosis not present

## 2018-04-05 DIAGNOSIS — S72141D Displaced intertrochanteric fracture of right femur, subsequent encounter for closed fracture with routine healing: Secondary | ICD-10-CM | POA: Diagnosis not present

## 2018-04-05 DIAGNOSIS — Z7982 Long term (current) use of aspirin: Secondary | ICD-10-CM | POA: Diagnosis not present

## 2018-04-05 DIAGNOSIS — H43813 Vitreous degeneration, bilateral: Secondary | ICD-10-CM | POA: Diagnosis not present

## 2018-04-12 DIAGNOSIS — M47816 Spondylosis without myelopathy or radiculopathy, lumbar region: Secondary | ICD-10-CM | POA: Diagnosis not present

## 2018-04-23 DIAGNOSIS — Z9889 Other specified postprocedural states: Secondary | ICD-10-CM | POA: Diagnosis not present

## 2018-04-23 NOTE — Progress Notes (Signed)
Subjective:    Patient ID: [name], female    DOB: 01-Aug-1929, 84 y.o.   MRN: [id number]  HPI The patient is here for follow up.  Her son is here with her today.  He is a retired Architect.  ED 03/29/18 for tachycardia.  She has a history of Afib not on anticoagulation.  She felt bad the day prior and had some palpitations.  Her son took her HR, which was 150-160 and irregular.  She was given metoprolol prior to arrival and felt better by the time she reached the ED. She was back in sinus rhythm, but was tachycardic.  She denied SOB, leg swelling or history of blood clots.  CXR showed pleural effusion, BNP 100, troponin normal.  EKG was back in sinus but she likely had paroxysmal Afib.  Her CHADVASC score if 4 and should be on eliquis.  She was discharged on eliquis and advised cardiology follow up.     She did mention to her son that she has had intermittent palpitations.  Her blood pressure is well controlled at home and typically 110/60.  Her heart rate is also well controlled.  She does take her medication on a regular basis.  She has not had any abnormal bleeding or bruising since being on the Eliquis.  Hip fracture, T12 vertebral fracture status post kyphoplasty: Since having the kyphoplasty she has been taking tramadol as needed.  She typically takes 1 a day or 1 every other day.  The medication works well and is helping with her back pain.  She is no longer following with orthopedics and would like a refill.  She denies side effects.  Osteoporosis: At her last visit we did discuss Prolia and she wanted to think about it, but he is interested in having Prolia at this time.  She is not currently exercising because of her recent orthopedic issues, but typically does exercise on a regular basis and is eager to get back to it.  She is taking calcium and vitamin D daily.  T12 compression fracture status post kyphoplasty, fracture: She is taking tramadol once a  day to once every other day.  This is helping with her pain and I will continue to prescribe.       Medications and allergies reviewed with patient and updated if appropriate.  Patient Active Problem List   Diagnosis Date Noted  . Femur fracture, right ([place]) 01/14/2018  . Hip fracture ([place]) 01/14/2018  . Dysuria 10/12/2017  . Macular degeneration (senile) of retina 06/14/2010  . Vitamin D deficiency 03/10/2009  . Osteoporosis 03/10/2009  . NEOPLASM, MALIGNANT, BLADDER, HX OF 03/10/2009  . 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  . Hyperglycemia 06/26/2008  . Essential hypertension 08/09/2007  . Paroxysmal atrial fibrillation ([place]) 08/09/2007    Current Outpatient Medications on File Prior to Visit  Medication Sig Dispense Refill  . acetaminophen (TYLENOL) 500 MG tablet Take 500 mg by mouth every 6 (six) hours as needed for moderate pain.    Marland Kitchen amLODipine (NORVASC) 5 MG tablet 1 tablet daily for blood pressure 90 tablet 1  . apixaban (ELIQUIS) 2.5 MG TABS tablet Take 1 tablet (2.5 mg total) by mouth 2 (two) times daily. 60 tablet 0  . aspirin EC 81 MG tablet Take 81 mg by mouth daily.    . calcium carbonate (OS-CAL) 600 MG TABS tablet Take 600 mg by mouth 2 (two) times daily.     . Ergocalciferol (VITAMIN D2) 2000 units TABS  Take 2,000 Units by mouth daily.    Marland Kitchen HYDROcodone-acetaminophen (NORCO/VICODIN) 5-325 MG tablet Take 1-2 tablets by mouth every 6 (six) hours as needed for moderate pain or severe pain. 30 tablet 0  . metoprolol tartrate (LOPRESSOR) 25 MG tablet Take 1 tablet (25 mg total) by mouth 2 (two) times daily. 180 tablet 1  . multivitamin-lutein (OCUVITE-LUTEIN) CAPS capsule Take 1 capsule by mouth daily.    . Omega-3 Fatty Acids (FISH OIL) 1000 MG CAPS Take 100 mg by mouth 2 (two) times daily.     . ondansetron (ZOFRAN ODT) 4 MG disintegrating tablet Take 1 tablet (4 mg total) by mouth every 8 (eight) hours as needed for nausea or vomiting. 15 tablet 0  . traMADol (ULTRAM) 50 MG  tablet Take 50 mg by mouth every 8 (eight) hours as needed for moderate pain.   0  . tretinoin (RETIN-A) 0.05 % cream Apply 1 application topically 2 (two) times a week.      No current facility-administered medications on file prior to visit.     Past Medical History:  Diagnosis Date  . Anemia   . Arthritis    "back" (01/16/2018)  . Atrial fibrillation ([place])   . Cancer [facility])    low grade papillary, most recent recurrence 2011,  Dr Karsten Ro  . 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 History of blood transfusion 01/15/2018   S/P OR  . Hypertension   . Hyponatremia 01/26/12   Na+ 131  . Macular degeneration, bilateral    "were wet; dry now" (01/16/2018)  . Other abnormal glucose 06/14/12   Fasting blood glucose 128; A1c 5.8% on 12/15/11    Past Surgical History:  Procedure Laterality Date  . ABDOMINAL HYSTERECTOMY  1970s   with oopherectomy for incidenctal cyst (no PMH of abnormal PAP)  . APPENDECTOMY     1970s  . BLADDER TUMOR EXCISION  2011 & 2013   Dr Karsten Ro  . CATARACT EXTRACTION, BILATERAL  2014   Dr Herbert Deaner  . COLONOSCOPY     negative; Dr Olevia Perches  . CYSTOSCOPY      multiple since tumor excision;Dr Ottelin  . DILATION AND CURETTAGE OF UTERUS    . FRACTURE SURGERY    . INTRAMEDULLARY (IM) NAIL INTERTROCHANTERIC Right 01/15/2018   Procedure: INTRAMEDULLARY (IM) NAIL INTERTROCHANTRIC;  Surgeon: Melrose Nakayama, MD;  Location: [place];  Service: Orthopedics;  Laterality: Right;  . KYPHOPLASTY N/A 03/08/2018   Procedure: THORACIC 12 KYPHOPLASTY;  Surgeon: Phylliss Bob, MD;  Location: [place];  Service: Orthopedics;  Laterality: N/A;  . TONSILLECTOMY      Social History   Socioeconomic History  . Marital status: Widowed    Spouse name: Not on file  . Number of children: 2  . Years of education: Not on file  . Highest education level: Not on file  Occupational History  . Occupation: Retired  Scientific laboratory technician  . Financial resource strain: Not on file  . Food  insecurity:    Worry: Not on file    Inability: Not on file  . Transportation needs:    Medical: Not on file    Non-medical: Not on file  Tobacco Use  . Smoking status: Never Smoker  . Smokeless tobacco: Never Used  Substance and Sexual Activity  . Alcohol use: Never    Alcohol/week: 0.0 standard drinks    Frequency: Never  . Drug use: Never  . Sexual activity: Not Currently  Lifestyle  . Physical activity:  Days per week: Not on file    Minutes per session: Not on file  . Stress: Not on file  Relationships  . Social connections:    Talks on phone: Not on file    Gets together: Not on file    Attends religious service: Not on file    Active member of club or organization: Not on file    Attends meetings of clubs or organizations: Not on file    Relationship status: Not on file  Other Topics Concern  . Not on file  Social History Narrative  . Not on file    Family History  Problem Relation Age of Onset  . Stroke Mother 78  . Heart attack Father 39  . Coronary artery disease Brother        S/P CBAG ? @ 60  . Diabetes Brother   . Kidney disease Neg Hx   . Cancer Neg Hx   . Colon cancer Neg Hx   . Colon polyps Neg Hx     Review of Systems  Constitutional: Negative for chills and fever.  Respiratory: Negative for cough, shortness of breath and wheezing.   Cardiovascular: Negative for chest pain, palpitations and leg swelling.  Neurological: Negative for light-headedness and headaches.       Objective:   Vitals:   04/24/18 0930  BP: (!) 168/70  Pulse: 79  Resp: 16  Temp: 98.8 F (37.1 C)  SpO2: 98%   BP Readings from Last 3 Encounters:  04/24/18 (!) 168/70  03/29/18 (!) 169/80  03/08/18 (!) 169/91   Wt Readings from Last 3 Encounters:  04/24/18 92 lb 12.8 oz (42.1 kg)  03/29/18 98 lb (44.5 kg)  03/08/18 101 lb (45.8 kg)   Body mass index is 15.93 kg/m.   Physical Exam    Constitutional: Appears well-developed and well-nourished. No  distress.  HENT:  Head: Normocephalic and atraumatic.  Neck: Neck supple. No tracheal deviation present. No thyromegaly present.  No cervical lymphadenopathy Cardiovascular: Normal rate, regular rhythm and normal heart sounds.   No murmur heard. No carotid bruit .  No edema Pulmonary/Chest: Effort normal and breath sounds normal. No respiratory distress. No has no wheezes. No rales.  Skin: Skin is warm and dry. Not diaphoretic.  Psychiatric: Normal mood and affect. Behavior is normal.   DG Chest 2 View CLINICAL DATA:  Tachycardia.  EXAM: CHEST - 2 VIEW  COMPARISON:  01/14/2018  FINDINGS: Enlarged cardiac silhouette. Calcific atherosclerotic disease and tortuosity of the aorta.  No evidence of pneumothorax. Hyperinflation of the lungs. Small bilateral pleural effusions versus pleural reflection.  Stable appearance of compression fracture of T12, post vertebroplasty with subsequent mild spinal kyphosis. Soft tissues are grossly normal.  IMPRESSION: Enlarged cardiac silhouette.  Small bilateral pleural effusions.  Hyperinflation of the lungs.  Electronically Signed   By: Fidela Salisbury M.D.   On: 03/29/2018 11:41   Lab Results  Component Value Date   WBC 7.0 03/29/2018   HGB 13.1 03/29/2018   HCT 40.7 03/29/2018   PLT 406 (H) 03/29/2018   GLUCOSE 125 (H) 03/29/2018   CHOL 209 (H) 01/24/2017   TRIG 68.0 01/24/2017   HDL 80.00 01/24/2017   LDLDIRECT 122.9 06/14/2010   LDLCALC 115 (H) 01/24/2017   ALT 13 03/29/2018   AST 21 03/29/2018   NA 142 03/29/2018   K 3.3 (L) 03/29/2018   CL 102 03/29/2018   CREATININE 0.56 03/29/2018   BUN 11 03/29/2018   CO2 30 03/29/2018  TSH 2.54 01/24/2017   INR 0.94 03/08/2018   HGBA1C 5.8 01/24/2017   MICROALBUR 2.4 (H) 07/04/2006    Assessment & Plan:    See Problem List for Assessment and Plan of chronic medical problems.

## 2018-04-24 ENCOUNTER — Encounter: Payer: Self-pay | Admitting: Internal Medicine

## 2018-04-24 ENCOUNTER — Ambulatory Visit: Payer: Medicare Other | Admitting: Internal Medicine

## 2018-04-24 VITALS — BP 168/70 | HR 79 | Temp 98.8°F | Resp 16 | Ht 64.0 in | Wt 92.8 lb

## 2018-04-24 DIAGNOSIS — I480 Paroxysmal atrial fibrillation: Secondary | ICD-10-CM | POA: Diagnosis not present

## 2018-04-24 DIAGNOSIS — S22080D Wedge compression fracture of T11-T12 vertebra, subsequent encounter for fracture with routine healing: Secondary | ICD-10-CM

## 2018-04-24 DIAGNOSIS — M810 Age-related osteoporosis without current pathological fracture: Secondary | ICD-10-CM

## 2018-04-24 DIAGNOSIS — S22080A Wedge compression fracture of T11-T12 vertebra, initial encounter for closed fracture: Secondary | ICD-10-CM | POA: Insufficient documentation

## 2018-04-24 DIAGNOSIS — S7291XA Unspecified fracture of right femur, initial encounter for closed fracture: Secondary | ICD-10-CM

## 2018-04-24 MED ORDER — APIXABAN 2.5 MG PO TABS: 2.5000 mg | ORAL_TABLET | Freq: Two times a day (BID) | ORAL | 5 refills | Status: DC

## 2018-04-24 MED ORDER — TRAMADOL HCL 50 MG PO TABS: 50.0000 mg | ORAL_TABLET | Freq: Three times a day (TID) | ORAL | 0 refills | Status: DC | PRN

## 2018-04-24 NOTE — Assessment & Plan Note (Signed)
Has some chronic lower back pain Tramadol daily to every other day as needed Will continue tramadol-refill today

## 2018-04-24 NOTE — Assessment & Plan Note (Signed)
She has osteoporosis and is interested in Prolia 01/14/2018-right hip fracture 02/2018-T12 compression fracture status post kyphoplasty Taking calcium and vitamin D daily Not currently exercising regularly, but typically does She is a good candidate for medication, especially Prolia She may also be a candidate for evenity

## 2018-04-24 NOTE — Assessment & Plan Note (Signed)
Has been having intermittent palpitations, which no and was aware of Likely paroxysmal atrial fibrillation Started on Eliquis-continue, refilled today Blood pressure and heart rate well controlled at home Continue metoprolol 25 mg twice daily Her son will schedule an appointment with cardiology-he will let me know if a referral is necessary

## 2018-04-24 NOTE — Patient Instructions (Addendum)
   Medications reviewed and updated.  Changes include :   none  Your prescription(s) have been submitted to your pharmacy. Please take as directed and contact our office if you believe you are having problem(s) with the medication(s).    Please followup in 6 months   

## 2018-04-30 ENCOUNTER — Telehealth: Payer: Self-pay

## 2018-04-30 NOTE — Telephone Encounter (Signed)
Insurance will be verified for prolia, I will call patient to discuss summary of benefits

## 2018-04-30 NOTE — Telephone Encounter (Signed)
-----   Message from Binnie Rail, MD sent at 04/24/2018 12:03 PM EDT ----- She has osteoporosis at her last visit several months ago we discussed Prolia and she is now interested in it.  Since that time she had a right hip fracture in June and T12 compression fracture in August.  She may be a better candidate for evenity but see what you think.  Her son is a retired primary care PA, so she may want you to talk to him.  Thanks SB

## 2018-05-01 DIAGNOSIS — M47816 Spondylosis without myelopathy or radiculopathy, lumbar region: Secondary | ICD-10-CM | POA: Diagnosis not present

## 2018-05-08 DIAGNOSIS — H353222 Exudative age-related macular degeneration, left eye, with inactive choroidal neovascularization: Secondary | ICD-10-CM | POA: Diagnosis not present

## 2018-05-08 DIAGNOSIS — H353212 Exudative age-related macular degeneration, right eye, with inactive choroidal neovascularization: Secondary | ICD-10-CM | POA: Diagnosis not present

## 2018-05-08 DIAGNOSIS — H35363 Drusen (degenerative) of macula, bilateral: Secondary | ICD-10-CM | POA: Diagnosis not present

## 2018-05-08 DIAGNOSIS — H353133 Nonexudative age-related macular degeneration, bilateral, advanced atrophic without subfoveal involvement: Secondary | ICD-10-CM | POA: Diagnosis not present

## 2018-06-04 ENCOUNTER — Other Ambulatory Visit: Payer: Self-pay | Admitting: Internal Medicine

## 2018-06-04 NOTE — Telephone Encounter (Signed)
[place] Controlled Substance Database checked. Last filled on 05/21/18 for 10 day supply  Last OV 04/24/18

## 2018-06-06 NOTE — Telephone Encounter (Signed)
Returned phone call to patient's son and left message with the following information:  Insurance has been submitted and verified for Prolia. Patient is responsible for a $240 copay. Due anytime. Left message for patient to call back to schedule.  Okay to schedule... Visit Note: Prolia ($240 copay - okay to give per Gareth Eagle) Visit Type: Nurse Provider: Nurse

## 2018-06-06 NOTE — Telephone Encounter (Signed)
Pt son is calling back checking on the status of his mom getting prolia injection

## 2018-06-12 ENCOUNTER — Ambulatory Visit: Payer: Medicare Other

## 2018-06-13 ENCOUNTER — Ambulatory Visit (INDEPENDENT_AMBULATORY_CARE_PROVIDER_SITE_OTHER): Payer: Medicare Other | Admitting: Emergency Medicine

## 2018-06-13 DIAGNOSIS — M810 Age-related osteoporosis without current pathological fracture: Secondary | ICD-10-CM | POA: Diagnosis not present

## 2018-06-13 MED ORDER — DENOSUMAB 60 MG/ML ~~LOC~~ SOSY
60.0000 mg | PREFILLED_SYRINGE | Freq: Once | SUBCUTANEOUS | Status: AC
  Administered 2018-06-13: 60 mg via SUBCUTANEOUS

## 2018-06-15 NOTE — Progress Notes (Signed)
prolia Injection given.   Orlin Kann J Gissela Bloch, MD  

## 2018-06-18 DIAGNOSIS — M47816 Spondylosis without myelopathy or radiculopathy, lumbar region: Secondary | ICD-10-CM | POA: Diagnosis not present

## 2018-06-19 ENCOUNTER — Other Ambulatory Visit: Payer: Self-pay | Admitting: Internal Medicine

## 2018-06-19 NOTE — Telephone Encounter (Signed)
Last refill was 06/04/18 Last OV was 04/24/18 Next OV is 10/24/18

## 2018-06-26 DIAGNOSIS — Z8551 Personal history of malignant neoplasm of bladder: Secondary | ICD-10-CM | POA: Diagnosis not present

## 2018-07-19 ENCOUNTER — Other Ambulatory Visit: Payer: Self-pay | Admitting: Internal Medicine

## 2018-07-19 NOTE — Telephone Encounter (Signed)
[place] Controlled Database Checked Last filled: 06/19/18 # 60 LOV w/you: 04/24/18 Next appt w/you:10/24/18

## 2018-07-24 DIAGNOSIS — M47816 Spondylosis without myelopathy or radiculopathy, lumbar region: Secondary | ICD-10-CM | POA: Diagnosis not present

## 2018-08-13 DIAGNOSIS — M47816 Spondylosis without myelopathy or radiculopathy, lumbar region: Secondary | ICD-10-CM | POA: Diagnosis not present

## 2018-09-08 ENCOUNTER — Other Ambulatory Visit: Payer: Self-pay | Admitting: Internal Medicine

## 2018-09-10 NOTE — Telephone Encounter (Signed)
Last refill was 07/19/18 Last OV 04/24/18 Next OV 10/24/18

## 2018-09-11 ENCOUNTER — Other Ambulatory Visit: Payer: Self-pay | Admitting: Internal Medicine

## 2018-09-11 DIAGNOSIS — I10 Essential (primary) hypertension: Secondary | ICD-10-CM

## 2018-09-11 MED ORDER — METOPROLOL TARTRATE 25 MG PO TABS: 25.0000 mg | ORAL_TABLET | Freq: Two times a day (BID) | ORAL | 0 refills | Status: DC

## 2018-09-11 MED ORDER — AMLODIPINE BESYLATE 5 MG PO TABS: ORAL_TABLET | ORAL | 0 refills | Status: DC

## 2018-09-11 NOTE — Telephone Encounter (Signed)
Requested medication (s) are due for refill today -yes  Requested medication (s) are on the active medication list -yes  Future visit scheduled -yes  Last refill: 01/17/18  Notes to clinic: patient is requesting a refill of medications prescribed by outside provider- sent for PCP review   Requested Prescriptions  Pending Prescriptions Disp Refills   amLODipine (NORVASC) 5 MG tablet 90 tablet 1    Sig: 1 tablet daily for blood pressure     Cardiovascular:  Calcium Channel Blockers Failed - 09/11/2018  1:30 PM      Failed - Last BP in normal range    BP Readings from Last 1 Encounters:  04/24/18 (!) 168/70         Passed - Valid encounter within last 6 months    Recent Outpatient Visits          4 months ago Paroxysmal atrial fibrillation ([place])   [place], Claudina Lick, MD   11 months ago [place], Enid Baas, MD   1 year ago Age-related osteoporosis without current pathological fracture   [place], Claudina Lick, MD   1 year ago Medicare annual wellness visit, subsequent   [place], Claudina Lick, MD   2 years ago Medicare annual wellness visit, subsequent   [place], Claudina Lick, MD      Future Appointments            In 2 weeks  [place], [facility]   In 1 month [place], Claudina Lick, MD [facility], [facility]          metoprolol tartrate (LOPRESSOR) 25 MG tablet 180 tablet 1    Sig: Take 1 tablet (25 mg total) by mouth 2 (two) times daily.     Cardiovascular:  Beta Blockers Failed - 09/11/2018  1:30 PM      Failed - Last BP in normal range    BP Readings from Last 1 Encounters:  04/24/18 (!) 168/70         Passed - Last Heart Rate in normal range    Pulse Readings from Last 1 Encounters:  04/24/18 79         Passed - Valid encounter within last 6 months    Recent  Outpatient Visits          4 months ago Paroxysmal atrial fibrillation ([place])   [place], MD   11 months ago [facility], Enid Baas, MD   1 year ago Age-related osteoporosis without current pathological fracture   [place], Stacy J, MD   1 year ago Medicare annual wellness visit, subsequent   [place], MD   2 years ago Medicare annual wellness visit, subsequent   [place], MD      Future Appointments            In 2 weeks  [place], [facility]   In 1 month Burns, Claudina Lick, MD [place], [facility]            Requested Prescriptions  Pending Prescriptions Disp Refills   amLODipine (NORVASC) 5 MG tablet 90 tablet 1    Sig: 1 tablet daily  for blood pressure     Cardiovascular:  Calcium Channel Blockers Failed - 09/11/2018  1:30 PM      Failed - Last BP in normal range    BP Readings from Last 1 Encounters:  04/24/18 (!) 168/70         Passed - Valid encounter within last 6 months    Recent Outpatient Visits          4 months ago Paroxysmal atrial fibrillation ([place])   [place], MD   11 months ago [place], Enid Baas, MD   1 year ago Age-related osteoporosis without current pathological fracture   [place], MD   1 year ago Medicare annual wellness visit, subsequent   [place], MD   2 years ago Medicare annual wellness visit, subsequent   [place], MD      Future Appointments            In 2 weeks  [place], [facility]   In 1 month Burns, Claudina Lick, MD [place], [facility]          metoprolol tartrate (LOPRESSOR) 25 MG tablet 180 tablet 1    Sig: Take 1 tablet (25 mg total) by mouth 2 (two) times daily.     Cardiovascular:  Beta Blockers Failed - 09/11/2018  1:30 PM      Failed - Last BP in normal range    BP Readings from Last 1 Encounters:  04/24/18 (!) 168/70         Passed - Last Heart Rate in normal range    Pulse Readings from Last 1 Encounters:  04/24/18 79         Passed - Valid encounter within last 6 months    Recent Outpatient Visits          4 months ago Paroxysmal atrial fibrillation ([place])   [place], MD   11 months ago [facility], Enid Baas, MD   1 year ago Age-related osteoporosis without current pathological fracture   [place], Stacy J, MD   1 year ago Medicare annual wellness visit, subsequent   [place], MD   2 years ago Medicare annual wellness visit, subsequent   [place], MD      Future Appointments            In 2 weeks  [place], [place]   In 1 month [place], Claudina Lick, MD [place], [facility]

## 2018-09-11 NOTE — Telephone Encounter (Signed)
Copied from [place] [id number]. Topic: Quick Communication - Rx Refill/Question >> Sep 11, 2018  1:25 PM [place], Sade R wrote: Medication: amLODipine (NORVASC) 5 MG tablet , metoprolol tartrate (LOPRESSOR) 25 MG tablet  Has the patient contacted their pharmacy? Yes  Preferred Pharmacy (with phone number or street name): [id number] [address], [place] [phone] (Phone) [phone] (Fax)    Agent: Please be advised that RX refills may take up to 3 business days. We ask that you follow-up with your pharmacy.

## 2018-09-18 DIAGNOSIS — M47816 Spondylosis without myelopathy or radiculopathy, lumbar region: Secondary | ICD-10-CM | POA: Diagnosis not present

## 2018-09-26 NOTE — Progress Notes (Signed)
Subjective:   [name] is a 85 y.o. female who presents for Medicare Annual (Subsequent) preventive examination.  Review of Systems:  No ROS.  Medicare Wellness Visit. Additional risk factors are reflected in the social history.  Cardiac Risk Factors include: advanced age (>34men, >2 women);dyslipidemia;hypertension Sleep patterns: feels rested on waking, gets up 1-3 times nightly to void and sleeps 8-9 hours nightly.    Home Safety/Smoke Alarms: Feels safe in home. Smoke alarms in place.  Living environment; residence and Firearm Safety: 1-story house/ trailer. Lives alone, no needs for DME, good support system Seat Belt Safety/Bike Helmet: Wears seat belt.     Objective:     Vitals: BP 132/66   Pulse 67   Resp 16   Ht 5\' 4"  (1.626 m)   Wt 97 lb (44 kg)   SpO2 98%   BMI 16.65 kg/m   Body mass index is 16.65 kg/m.  Advanced Directives 09/27/2018 03/29/2018 01/16/2018  Does Patient Have a Medical Advance Directive? Yes Yes No  Type of Paramedic of [place];Living will [place];Living will -  Copy of [place] in Chart? No - copy requested - -  Would patient like information on creating a medical advance directive? - - Yes (Inpatient - patient defers creating a medical advance directive at this time)    Tobacco Social History   Tobacco Use  Smoking Status Never Smoker  Smokeless Tobacco Never Used     Counseling given: Not Answered  Past Medical History:  Diagnosis Date  . Anemia   . Arthritis    "back" (01/16/2018)  . Atrial fibrillation ([place])   . Cancer [facility])    low grade papillary, most recent recurrence 2011,  Dr Karsten Ro  . 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 History of blood transfusion 01/15/2018   S/P OR  . Hypertension   . Hyponatremia 01/26/12   Na+ 131  . Macular degeneration, bilateral    "were wet; dry now" (01/16/2018)  . Other abnormal glucose 06/14/12   Fasting blood glucose 128; A1c 5.8% on 12/15/11   Past Surgical History:  Procedure Laterality Date  . ABDOMINAL HYSTERECTOMY  1970s   with oopherectomy for incidenctal cyst (no PMH of abnormal PAP)  . APPENDECTOMY     1970s  . BLADDER TUMOR EXCISION  2011 & 2013   Dr Karsten Ro  . CATARACT EXTRACTION, BILATERAL  2014   Dr Herbert Deaner  . COLONOSCOPY     negative; Dr Olevia Perches  . CYSTOSCOPY      multiple since tumor excision;Dr Ottelin  . DILATION AND CURETTAGE OF UTERUS    . FRACTURE SURGERY    . INTRAMEDULLARY (IM) NAIL INTERTROCHANTERIC Right 01/15/2018   Procedure: INTRAMEDULLARY (IM) NAIL INTERTROCHANTRIC;  Surgeon: Melrose Nakayama, MD;  Location: [place];  Service: Orthopedics;  Laterality: Right;  . KYPHOPLASTY N/A 03/08/2018   Procedure: THORACIC 12 KYPHOPLASTY;  Surgeon: Phylliss Bob, MD;  Location: [place];  Service: Orthopedics;  Laterality: N/A;  . TONSILLECTOMY     Family History  Problem Relation Age of Onset  . Stroke Mother 12  . Heart attack Father 85  . Coronary artery disease Brother        S/P CBAG ? @ 60  . Diabetes Brother   . Kidney disease Neg Hx   . Cancer Neg Hx   . Colon cancer Neg Hx   . Colon polyps Neg Hx    Social History  Socioeconomic History  . Marital status: Widowed    Spouse name: Not on file  . Number of children: 2  . Years of education: Not on file  . Highest education level: Not on file  Occupational History  . Occupation: Retired  Scientific laboratory technician  . Financial resource strain: Not hard at all  . Food insecurity:    Worry: Never true    Inability: Never true  . Transportation needs:    Medical: No    Non-medical: No  Tobacco Use  . Smoking status: Never Smoker  . Smokeless tobacco: Never Used  Substance and Sexual Activity  . Alcohol use: Never    Alcohol/week: 0.0 standard drinks    Frequency: Never  . Drug use: Never  . Sexual activity: Never  Lifestyle  . Physical activity:    Days per week: 3 days    Minutes per session: 30 min   . Stress: Not at all  Relationships  . Social connections:    Talks on phone: More than three times a week    Gets together: More than three times a week    Attends religious service: More than 4 times per year    Active member of club or organization: Yes    Attends meetings of clubs or organizations: More than 4 times per year    Relationship status: Widowed  Other Topics Concern  . Not on file  Social History Narrative  . Not on file    Outpatient Encounter Medications as of 09/27/2018  Medication Sig  . acetaminophen (TYLENOL) 500 MG tablet Take 500 mg by mouth every 6 (six) hours as needed for moderate pain.  Marland Kitchen amLODipine (NORVASC) 5 MG tablet 1 tablet daily for blood pressure  . apixaban (ELIQUIS) 2.5 MG TABS tablet Take 1 tablet (2.5 mg total) by mouth 2 (two) times daily.  Marland Kitchen aspirin EC 81 MG tablet Take 81 mg by mouth daily.  . calcium carbonate (OS-CAL) 600 MG TABS tablet Take 600 mg by mouth 2 (two) times daily.   . Ergocalciferol (VITAMIN D2) 2000 units TABS Take 2,000 Units by mouth daily.  . metoprolol tartrate (LOPRESSOR) 25 MG tablet Take 1 tablet (25 mg total) by mouth 2 (two) times daily. Follow-up appt due in April must see provider for future refills  . multivitamin-lutein (OCUVITE-LUTEIN) CAPS capsule Take 1 capsule by mouth daily.  . Omega-3 Fatty Acids (FISH OIL) 1000 MG CAPS Take 100 mg by mouth 2 (two) times daily.   . traMADol (ULTRAM) 50 MG tablet TAKE 1 TABLET BY MOUTH EVERY 8 HOURS AS NEEDED FOR MODERATE PAIN  . tretinoin (RETIN-A) 0.05 % cream Apply 1 application topically 2 (two) times a week.    No facility-administered encounter medications on file as of 09/27/2018.     Activities of Daily Living In your present state of health, do you have any difficulty performing the following activities: 09/27/2018 01/16/2018  Hearing? N -  Vision? N -  Difficulty concentrating or making decisions? N -  Walking or climbing stairs? N -  Comment - -  Dressing or  bathing? N -  Doing errands, shopping? N N  Preparing Food and eating ? N -  Using the Toilet? N -  In the past six months, have you accidently leaked urine? N -  Do you have problems with loss of bowel control? N -  Managing your Medications? N -  Managing your Finances? N -  Housekeeping or managing your Housekeeping? N -  Some recent data might be hidden    Patient Care Team: Binnie Rail, MD as PCP - General (Internal Medicine)    Assessment:   This is a routine wellness examination for [name]. Physical assessment deferred to PCP.  Exercise Activities and Dietary recommendations Current Exercise Habits: Structured exercise class;Home exercise routine, Type of exercise: walking;stretching;calisthenics, Time (Minutes): 30, Frequency (Times/Week): 3, Weekly Exercise (Minutes/Week): 90, Intensity: Mild, Exercise limited by: orthopedic condition(s)  Diet (meal preparation, eat out, water intake, caffeinated beverages, dairy products, fruits and vegetables): in general, a "healthy" diet  , well balanced Reports good appetite.  Low BMI, discussed supplementing with Ensure, samples and coupons provided. Encouraged patient to increase daily water and healthy fluid intake.  Goals    . Patient Stated     Stay as independent as possible       Fall Risk Fall Risk  09/27/2018 04/11/2017 12/16/2015 07/16/2013  Falls in the past year? 1 No No No  Number falls in past yr: 0 - - -  Injury with Fall? 1 - - -  Follow up Falls prevention discussed - - -   Depression Screen PHQ 2/9 Scores 09/27/2018 04/11/2017 12/16/2015 07/16/2013  PHQ - 2 Score 1 0 0 0     Cognitive Function MMSE - Mini Mental State Exam 09/27/2018  Orientation to time 5  Orientation to Place 5  Registration 3  Attention/ Calculation 5  Recall 1  Language- name 2 objects 2  Language- repeat 1  Language- follow 3 step command 3  Language- read & follow direction 1  Write a sentence 1  Copy design 0  Total score 27         Immunization History  Administered Date(s) Administered  . Influenza Whole 05/20/2008  . Influenza, High Dose Seasonal PF 04/27/2016  . Influenza,inj,Quad PF,6+ Mos 04/18/2013, 03/27/2014, 04/28/2015  . Influenza-Unspecified 04/16/2018  . Pneumococcal Conjugate-13 12/16/2015  . Pneumococcal Polysaccharide-23 03/12/2005  . Td 07/19/2003  . Tdap 01/24/2017  . Zoster 07/17/2011   Screening Tests Health Maintenance  Topic Date Due  . DEXA SCAN  03/24/2019  . TETANUS/TDAP  01/25/2027  . INFLUENZA VACCINE  Completed  . PNA vac Low Risk Adult  Completed      Plan:      Reviewed health maintenance screenings with patient today and relevant education, vaccines, and/or referrals were provided.   Continue doing brain stimulating activities (puzzles, reading, adult coloring books, staying active) to keep memory sharp.   Continue to eat heart healthy diet (full of fruits, vegetables, whole grains, lean protein, water--limit salt, fat, and sugar intake) and increase physical activity as tolerated.  I have personally reviewed and noted the following in the patient's chart:   . Medical and social history . Use of alcohol, tobacco or illicit drugs  . Current medications and supplements . Functional ability and status . Nutritional status . Physical activity . Advanced directives . List of other physicians . Vitals . Screenings to include cognitive, depression, and falls . Referrals and appointments  In addition, I have reviewed and discussed with patient certain preventive protocols, quality metrics, and best practice recommendations. A written personalized care plan for preventive services as well as general preventive health recommendations were provided to patient.     Michiel Cowboy, RN  09/27/2018

## 2018-09-27 ENCOUNTER — Other Ambulatory Visit: Payer: Self-pay

## 2018-09-27 ENCOUNTER — Ambulatory Visit (INDEPENDENT_AMBULATORY_CARE_PROVIDER_SITE_OTHER): Payer: Medicare Other | Admitting: *Deleted

## 2018-09-27 VITALS — BP 132/66 | HR 67 | Resp 16 | Ht 64.0 in | Wt 97.0 lb

## 2018-09-27 DIAGNOSIS — Z0000 Encounter for general adult medical examination without abnormal findings: Secondary | ICD-10-CM | POA: Diagnosis not present

## 2018-09-27 NOTE — Patient Instructions (Signed)
Continue doing brain stimulating activities (puzzles, reading, adult coloring books, staying active) to keep memory sharp.   Continue to eat heart healthy diet (full of fruits, vegetables, whole grains, lean protein, water--limit salt, fat, and sugar intake) and increase physical activity as tolerated.   [name] , Thank you for taking time to come for your Medicare Wellness Visit. I appreciate your ongoing commitment to your health goals. Please review the following plan we discussed and let me know if I can assist you in the future.   These are the goals we discussed: Goals    . Patient Stated     Stay as independent as possible       This is a list of the screening recommended for you and due dates:  Health Maintenance  Topic Date Due  . DEXA scan (bone density measurement)  03/24/2019  . Tetanus Vaccine  01/25/2027  . Flu Shot  Completed  . Pneumonia vaccines  Completed   Health Maintenance, Female Adopting a healthy lifestyle and getting preventive care can go a long way to promote health and wellness. Talk with your health care provider about what schedule of regular examinations is right for you. This is a good chance for you to check in with your provider about disease prevention and staying healthy. In between checkups, there are plenty of things you can do on your own. Experts have done a lot of research about which lifestyle changes and preventive measures are most likely to keep you healthy. Ask your health care provider for more information. Weight and diet Eat a healthy diet  Be sure to include plenty of vegetables, fruits, low-fat dairy products, and lean protein.  Do not eat a lot of foods high in solid fats, added sugars, or salt.  Get regular exercise. This is one of the most important things you can do for your health. ? Most adults should exercise for at least 150 minutes each week. The exercise should increase your heart rate and make you sweat  (moderate-intensity exercise). ? Most adults should also do strengthening exercises at least twice a week. This is in addition to the moderate-intensity exercise. Maintain a healthy weight  Body mass index (BMI) is a measurement that can be used to identify possible weight problems. It estimates body fat based on height and weight. Your health care provider can help determine your BMI and help you achieve or maintain a healthy weight.  For females 53 years of age and older: ? A BMI below 18.5 is considered underweight. ? A BMI of 18.5 to 24.9 is normal. ? A BMI of 25 to 29.9 is considered overweight. ? A BMI of 30 and above is considered obese. Watch levels of cholesterol and blood lipids  You should start having your blood tested for lipids and cholesterol at 83 years of age, then have this test every 5 years.  You may need to have your cholesterol levels checked more often if: ? Your lipid or cholesterol levels are high. ? You are older than 83 years of age. ? You are at high risk for heart disease. Cancer screening Lung Cancer  Lung cancer screening is recommended for adults 109-61 years old who are at high risk for lung cancer because of a history of smoking.  A yearly low-dose CT scan of the lungs is recommended for people who: ? Currently smoke. ? Have quit within the past 15 years. ? Have at least a 30-pack-year history of smoking. A pack year  is smoking an average of one pack of cigarettes a day for 1 year.  Yearly screening should continue until it has been 15 years since you quit.  Yearly screening should stop if you develop a health problem that would prevent you from having lung cancer treatment. Breast Cancer  Practice breast self-awareness. This means understanding how your breasts normally appear and feel.  It also means doing regular breast self-exams. Let your health care provider know about any changes, no matter how small.  If you are in your 20s or 30s, you  should have a clinical breast exam (CBE) by a health care provider every 1-3 years as part of a regular health exam.  If you are 31 or older, have a CBE every year. Also consider having a breast X-ray (mammogram) every year.  If you have a family history of breast cancer, talk to your health care provider about genetic screening.  If you are at high risk for breast cancer, talk to your health care provider about having an MRI and a mammogram every year.  Breast cancer gene (BRCA) assessment is recommended for women who have family members with BRCA-related cancers. BRCA-related cancers include: ? Breast. ? Ovarian. ? Tubal. ? Peritoneal cancers.  Results of the assessment will determine the need for genetic counseling and BRCA1 and BRCA2 testing. Cervical Cancer Your health care provider may recommend that you be screened regularly for cancer of the pelvic organs (ovaries, uterus, and vagina). This screening involves a pelvic examination, including checking for microscopic changes to the surface of your cervix (Pap test). You may be encouraged to have this screening done every 3 years, beginning at age 3.  For women ages 64-65, health care providers may recommend pelvic exams and Pap testing every 3 years, or they may recommend the Pap and pelvic exam, combined with testing for human papilloma virus (HPV), every 5 years. Some types of HPV increase your risk of cervical cancer. Testing for HPV may also be done on women of any age with unclear Pap test results.  Other health care providers may not recommend any screening for nonpregnant women who are considered low risk for pelvic cancer and who do not have symptoms. Ask your health care provider if a screening pelvic exam is right for you.  If you have had past treatment for cervical cancer or a condition that could lead to cancer, you need Pap tests and screening for cancer for at least 20 years after your treatment. If Pap tests have been  discontinued, your risk factors (such as having a new sexual partner) need to be reassessed to determine if screening should resume. Some women have medical problems that increase the chance of getting cervical cancer. In these cases, your health care provider may recommend more frequent screening and Pap tests. Colorectal Cancer  This type of cancer can be detected and often prevented.  Routine colorectal cancer screening usually begins at 84 years of age and continues through 83 years of age.  Your health care provider may recommend screening at an earlier age if you have risk factors for colon cancer.  Your health care provider may also recommend using home test kits to check for hidden blood in the stool.  A small camera at the end of a tube can be used to examine your colon directly (sigmoidoscopy or colonoscopy). This is done to check for the earliest forms of colorectal cancer.  Routine screening usually begins at age 18.  Direct examination of the  colon should be repeated every 5-10 years through 83 years of age. However, you may need to be screened more often if early forms of precancerous polyps or small growths are found. Skin Cancer  Check your skin from head to toe regularly.  Tell your health care provider about any new moles or changes in moles, especially if there is a change in a mole's shape or color.  Also tell your health care provider if you have a mole that is larger than the size of a pencil eraser.  Always use sunscreen. Apply sunscreen liberally and repeatedly throughout the day.  Protect yourself by wearing long sleeves, pants, a wide-brimmed hat, and sunglasses whenever you are outside. Heart disease, diabetes, and high blood pressure  High blood pressure causes heart disease and increases the risk of stroke. High blood pressure is more likely to develop in: ? People who have blood pressure in the high end of the normal range (130-139/85-89 mm Hg). ? People  who are overweight or obese. ? People who are African American.  If you are 44-63 years of age, have your blood pressure checked every 3-5 years. If you are 48 years of age or older, have your blood pressure checked every year. You should have your blood pressure measured twice-once when you are at a hospital or clinic, and once when you are not at a hospital or clinic. Record the average of the two measurements. To check your blood pressure when you are not at a hospital or clinic, you can use: ? An automated blood pressure machine at a pharmacy. ? A home blood pressure monitor.  If you are between 53 years and 71 years old, ask your health care provider if you should take aspirin to prevent strokes.  Have regular diabetes screenings. This involves taking a blood sample to check your fasting blood sugar level. ? If you are at a normal weight and have a low risk for diabetes, have this test once every three years after 83 years of age. ? If you are overweight and have a high risk for diabetes, consider being tested at a younger age or more often. Preventing infection Hepatitis B  If you have a higher risk for hepatitis B, you should be screened for this virus. You are considered at high risk for hepatitis B if: ? You were born in a country where hepatitis B is common. Ask your health care provider which countries are considered high risk. ? Your parents were born in a high-risk country, and you have not been immunized against hepatitis B (hepatitis B vaccine). ? You have HIV or AIDS. ? You use needles to inject street drugs. ? You live with someone who has hepatitis B. ? You have had sex with someone who has hepatitis B. ? You get hemodialysis treatment. ? You take certain medicines for conditions, including cancer, organ transplantation, and autoimmune conditions. Hepatitis C  Blood testing is recommended for: ? Everyone born from [birth year] through 1963. ? Anyone with known risk factors for  hepatitis C. Sexually transmitted infections (STIs)  You should be screened for sexually transmitted infections (STIs) including gonorrhea and chlamydia if: ? You are sexually active and are younger than 83 years of age. ? You are older than 83 years of age and your health care provider tells you that you are at risk for this type of infection. ? Your sexual activity has changed since you were last screened and you are at an increased risk for chlamydia  or gonorrhea. Ask your health care provider if you are at risk.  If you do not have HIV, but are at risk, it may be recommended that you take a prescription medicine daily to prevent HIV infection. This is called pre-exposure prophylaxis (PrEP). You are considered at risk if: ? You are sexually active and do not regularly use condoms or know the HIV status of your partner(s). ? You take drugs by injection. ? You are sexually active with a partner who has HIV. Talk with your health care provider about whether you are at high risk of being infected with HIV. If you choose to begin PrEP, you should first be tested for HIV. You should then be tested every 3 months for as long as you are taking PrEP. Pregnancy  If you are premenopausal and you may become pregnant, ask your health care provider about preconception counseling.  If you may become pregnant, take 400 to 800 micrograms (mcg) of folic acid every day.  If you want to prevent pregnancy, talk to your health care provider about birth control (contraception). Osteoporosis and menopause  Osteoporosis is a disease in which the bones lose minerals and strength with aging. This can result in serious bone fractures. Your risk for osteoporosis can be identified using a bone density scan.  If you are 71 years of age or older, or if you are at risk for osteoporosis and fractures, ask your health care provider if you should be screened.  Ask your health care provider whether you should take a calcium  or vitamin D supplement to lower your risk for osteoporosis.  Menopause may have certain physical symptoms and risks.  Hormone replacement therapy may reduce some of these symptoms and risks. Talk to your health care provider about whether hormone replacement therapy is right for you. Follow these instructions at home:  Schedule regular health, dental, and eye exams.  Stay current with your immunizations.  Do not use any tobacco products including cigarettes, chewing tobacco, or electronic cigarettes.  If you are pregnant, do not drink alcohol.  If you are breastfeeding, limit how much and how often you drink alcohol.  Limit alcohol intake to no more than 1 drink per day for nonpregnant women. One drink equals 12 ounces of beer, 5 ounces of Stephanne Greeley, or 1 ounces of hard liquor.  Do not use street drugs.  Do not share needles.  Ask your health care provider for help if you need support or information about quitting drugs.  Tell your health care provider if you often feel depressed.  Tell your health care provider if you have ever been abused or do not feel safe at home. This information is not intended to replace advice given to you by your health care provider. Make sure you discuss any questions you have with your health care provider. Document Released: 01/17/2011 Document Revised: 12/10/2015 Document Reviewed: 04/07/2015 Elsevier Interactive Patient Education  2019 [employer].

## 2018-09-28 NOTE — Progress Notes (Signed)
Medical screening examination/treatment/procedure(s) were performed by non-physician practitioner and as supervising physician I was immediately available for consultation/collaboration. I agree with above. Tikia Skilton A Bellanie Matthew, MD 

## 2018-10-23 ENCOUNTER — Other Ambulatory Visit: Payer: Self-pay | Admitting: Internal Medicine

## 2018-10-24 ENCOUNTER — Ambulatory Visit: Payer: Medicare Other | Admitting: Internal Medicine

## 2018-10-30 ENCOUNTER — Other Ambulatory Visit: Payer: Self-pay | Admitting: Internal Medicine

## 2018-11-05 ENCOUNTER — Encounter: Payer: Self-pay | Admitting: Internal Medicine

## 2018-11-05 ENCOUNTER — Other Ambulatory Visit: Payer: Self-pay

## 2018-11-05 ENCOUNTER — Other Ambulatory Visit (INDEPENDENT_AMBULATORY_CARE_PROVIDER_SITE_OTHER): Payer: Medicare Other

## 2018-11-05 ENCOUNTER — Ambulatory Visit: Payer: Medicare Other | Admitting: Internal Medicine

## 2018-11-05 VITALS — BP 142/84 | HR 55 | Temp 97.8°F | Resp 16 | Ht 64.0 in | Wt 96.0 lb

## 2018-11-05 DIAGNOSIS — S22080D Wedge compression fracture of T11-T12 vertebra, subsequent encounter for fracture with routine healing: Secondary | ICD-10-CM

## 2018-11-05 DIAGNOSIS — E559 Vitamin D deficiency, unspecified: Secondary | ICD-10-CM | POA: Diagnosis not present

## 2018-11-05 DIAGNOSIS — R739 Hyperglycemia, unspecified: Secondary | ICD-10-CM

## 2018-11-05 DIAGNOSIS — I10 Essential (primary) hypertension: Secondary | ICD-10-CM

## 2018-11-05 DIAGNOSIS — I480 Paroxysmal atrial fibrillation: Secondary | ICD-10-CM

## 2018-11-05 DIAGNOSIS — M810 Age-related osteoporosis without current pathological fracture: Secondary | ICD-10-CM

## 2018-11-05 LAB — COMPREHENSIVE METABOLIC PANEL
ALT: 11 U/L (ref 0–35)
AST: 12 U/L (ref 0–37)
Albumin: 4.1 g/dL (ref 3.5–5.2)
Alkaline Phosphatase: 79 U/L (ref 39–117)
BUN: 19 mg/dL (ref 6–23)
CO2: 30 mEq/L (ref 19–32)
Calcium: 9.6 mg/dL (ref 8.4–10.5)
Chloride: 100 mEq/L (ref 96–112)
Creatinine, Ser: 0.77 mg/dL (ref 0.40–1.20)
GFR: 70.94 mL/min (ref >60.00)
Glucose, Bld: 88 mg/dL (ref 70–99)
Potassium: 4.1 mEq/L (ref 3.5–5.1)
Sodium: 137 mEq/L (ref 135–145)
Total Bilirubin: 0.4 mg/dL (ref 0.2–1.2)
Total Protein: 7.5 g/dL (ref 6.0–8.3)

## 2018-11-05 LAB — CBC WITH DIFFERENTIAL/PLATELET
Basophils Absolute: 0 10*3/uL (ref 0.0–0.1)
Basophils Relative: 0.6 % (ref 0.0–3.0)
Eosinophils Absolute: 0.1 10*3/uL (ref 0.0–0.7)
Eosinophils Relative: 2.1 % (ref 0.0–5.0)
HCT: 40.9 % (ref 36.0–46.0)
Hemoglobin: 13.9 g/dL (ref 12.0–15.0)
Lymphocytes Relative: 17.6 % (ref 12.0–46.0)
Lymphs Abs: 1.2 10*3/uL (ref 0.7–4.0)
MCHC: 33.9 g/dL (ref 30.0–36.0)
MCV: 91.1 fl (ref 78.0–100.0)
Monocytes Absolute: 0.7 10*3/uL (ref 0.1–1.0)
Monocytes Relative: 10.4 % (ref 3.0–12.0)
Neutro Abs: 4.6 10*3/uL (ref 1.4–7.7)
Neutrophils Relative %: 69.3 % (ref 43.0–77.0)
Platelets: 296 10*3/uL (ref 150.0–400.0)
RBC: 4.49 Mil/uL (ref 3.87–5.11)
RDW: 13.6 % (ref 11.5–15.5)
WBC: 6.6 10*3/uL (ref 4.0–10.5)

## 2018-11-05 LAB — HEMOGLOBIN A1C: Hgb A1c MFr Bld: 6.1 % (ref 4.6–6.5)

## 2018-11-05 LAB — TSH: TSH: 2.7 u[IU]/mL (ref 0.35–4.50)

## 2018-11-05 LAB — VITAMIN D 25 HYDROXY (VIT D DEFICIENCY, FRACTURES): VITD: 35.31 ng/mL (ref 30.00–100.00)

## 2018-11-05 NOTE — Assessment & Plan Note (Signed)
Taking cal/vit d Check vitamin d level No falls Doing some exercise Had prolia 05/2018 - due the end of may

## 2018-11-05 NOTE — Assessment & Plan Note (Signed)
Chronic back pain - T12 fx and arthritis Tylenol Tramadol daily prn - effective and no side effects She tries not to take the tramadol continue

## 2018-11-05 NOTE — Assessment & Plan Note (Signed)
BP controlled at home BP well controlled Current regimen effective and well tolerated Continue current medications at current doses cmp

## 2018-11-05 NOTE — Progress Notes (Signed)
Subjective:    Patient ID: [name], female    DOB: Aug 01, 1929, 85 y.o.   MRN: [id number]  HPI The patient is here for follow up.  She is exercising regularly - she is doing some exercises at home.     PAfib, Hypertension: She is taking her medication daily. She is compliant with a low sodium diet.  She has occasional palpitations but feels it is more at times of increased stress.  She denies chest pain, edema, shortness of breath and regular headaches. She does monitor her blood pressure at home - it is usually on the lower side.    Osteoporosis:  She denies falls.  She is getting prolia - her last  Injection was 06/13/2018.  She is taking her calcium and vitamin d daily.  She is doing some exercise at home.    Chronic back pain from T12 compression fracture:  She has daily back pain.  She takes tramadol if needed, but tries not to take it every day.  She denies side effects.  It is effective.    Hyperglycemia:  She is not compliant with a low sugar/carbohydrate diet.  She is exercising.   Medications and allergies reviewed with patient and updated if appropriate.  Patient Active Problem List   Diagnosis Date Noted  . T12 compression fracture ([place]) 04/24/2018  . Femur fracture, right ([place]) 01/14/2018  . Dysuria 10/12/2017  . Macular degeneration (senile) of retina 06/14/2010  . Vitamin D deficiency 03/10/2009  . Osteoporosis 03/10/2009  . NEOPLASM, MALIGNANT, BLADDER, HX OF 03/10/2009  . 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  . Hyperglycemia 06/26/2008  . Essential hypertension 08/09/2007  . Paroxysmal atrial fibrillation ([place]) 08/09/2007    Current Outpatient Medications on File Prior to Visit  Medication Sig Dispense Refill  . acetaminophen (TYLENOL) 500 MG tablet Take 500 mg by mouth every 6 (six) hours as needed for moderate pain.    Marland Kitchen amLODipine (NORVASC) 5 MG tablet 1 tablet daily for blood pressure 90 tablet 0  . aspirin EC 81 MG tablet Take 81 mg by mouth daily.     . calcium carbonate (OS-CAL) 600 MG TABS tablet Take 600 mg by mouth 2 (two) times daily.     Marland Kitchen ELIQUIS 2.5 MG TABS tablet TAKE 1 TABLET BY MOUTH TWICE A DAY 60 tablet 5  . Ergocalciferol (VITAMIN D2) 2000 units TABS Take 2,000 Units by mouth daily.    . metoprolol tartrate (LOPRESSOR) 25 MG tablet Take 1 tablet (25 mg total) by mouth 2 (two) times daily. Follow-up appt due in April must see provider for future refills 180 tablet 0  . multivitamin-lutein (OCUVITE-LUTEIN) CAPS capsule Take 1 capsule by mouth daily.    . Omega-3 Fatty Acids (FISH OIL) 1000 MG CAPS Take 100 mg by mouth 2 (two) times daily.     . traMADol (ULTRAM) 50 MG tablet TAKE 1 TABLET BY MOUTH EVERY 8 HOURS AS NEEDED FOR MODERATE PAIN 60 tablet 0  . tretinoin (RETIN-A) 0.05 % cream Apply 1 application topically 2 (two) times a week.      No current facility-administered medications on file prior to visit.     Past Medical History:  Diagnosis Date  . Anemia   . Arthritis    "back" (01/16/2018)  . Atrial fibrillation ([place])   . Cancer [facility])    low grade papillary, most recent recurrence 2011,  Dr Karsten Ro  . 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 History of blood transfusion  01/15/2018   S/P OR  . Hypertension   . Hyponatremia 01/26/12   Na+ 131  . Macular degeneration, bilateral    "were wet; dry now" (01/16/2018)  . Other abnormal glucose 06/14/12   Fasting blood glucose 128; A1c 5.8% on 12/15/11    Past Surgical History:  Procedure Laterality Date  . ABDOMINAL HYSTERECTOMY  1970s   with oopherectomy for incidenctal cyst (no PMH of abnormal PAP)  . APPENDECTOMY     1970s  . BLADDER TUMOR EXCISION  2011 & 2013   Dr Karsten Ro  . CATARACT EXTRACTION, BILATERAL  2014   Dr Herbert Deaner  . COLONOSCOPY     negative; Dr Olevia Perches  . CYSTOSCOPY      multiple since tumor excision;Dr Ottelin  . DILATION AND CURETTAGE OF UTERUS    . FRACTURE SURGERY    . INTRAMEDULLARY (IM) NAIL INTERTROCHANTERIC Right 01/15/2018    Procedure: INTRAMEDULLARY (IM) NAIL INTERTROCHANTRIC;  Surgeon: Melrose Nakayama, MD;  Location: [place];  Service: Orthopedics;  Laterality: Right;  . KYPHOPLASTY N/A 03/08/2018   Procedure: THORACIC 12 KYPHOPLASTY;  Surgeon: Phylliss Bob, MD;  Location: [place];  Service: Orthopedics;  Laterality: N/A;  . TONSILLECTOMY      Social History   Socioeconomic History  . Marital status: Widowed    Spouse name: Not on file  . Number of children: 2  . Years of education: Not on file  . Highest education level: Not on file  Occupational History  . Occupation: Retired  Scientific laboratory technician  . Financial resource strain: Not hard at all  . Food insecurity:    Worry: Never true    Inability: Never true  . Transportation needs:    Medical: No    Non-medical: No  Tobacco Use  . Smoking status: Never Smoker  . Smokeless tobacco: Never Used  Substance and Sexual Activity  . Alcohol use: Never    Alcohol/week: 0.0 standard drinks    Frequency: Never  . Drug use: Never  . Sexual activity: Never  Lifestyle  . Physical activity:    Days per week: 3 days    Minutes per session: 30 min  . Stress: Not at all  Relationships  . Social connections:    Talks on phone: More than three times a week    Gets together: More than three times a week    Attends religious service: More than 4 times per year    Active member of club or organization: Yes    Attends meetings of clubs or organizations: More than 4 times per year    Relationship status: Widowed  Other Topics Concern  . Not on file  Social History Narrative  . Not on file    Family History  Problem Relation Age of Onset  . Stroke Mother 45  . Heart attack Father 49  . Coronary artery disease Brother        S/P CBAG ? @ 60  . Diabetes Brother   . Kidney disease Neg Hx   . Cancer Neg Hx   . Colon cancer Neg Hx   . Colon polyps Neg Hx     Review of Systems  Constitutional: Negative for chills and fever.  Respiratory: Negative for cough,  shortness of breath and wheezing.   Cardiovascular: Positive for palpitations (anxiety related only) and leg swelling (elevates leg). Negative for chest pain.  Neurological: Negative for light-headedness and headaches.       Objective:   Vitals:   11/05/18 8850  BP: (!) 142/84  Pulse: (!) 55  Resp: 16  Temp: 97.8 F (36.6 C)  SpO2: 99%   BP Readings from Last 3 Encounters:  11/05/18 (!) 142/84  09/27/18 132/66  04/24/18 (!) 168/70   Wt Readings from Last 3 Encounters:  11/05/18 96 lb (43.5 kg)  09/27/18 97 lb (44 kg)  04/24/18 92 lb 12.8 oz (42.1 kg)   Body mass index is 16.48 kg/m.   Physical Exam    Constitutional: Appears well-developed and well-nourished. No distress.  HENT:  Head: Normocephalic and atraumatic.  Neck: Neck supple. No tracheal deviation present. No thyromegaly present.  No cervical lymphadenopathy Cardiovascular: Normal rate, irregular rhythm and normal heart sounds.  No murmur heard. No carotid bruit .  1+ pitting edema 1/2 way up lower legs Pulmonary/Chest: Effort normal and breath sounds normal. No respiratory distress. No has no wheezes. No rales.  Skin: Skin is warm and dry. Not diaphoretic.  Psychiatric: Normal mood and affect. Behavior is normal.      Assessment & Plan:    See Problem List for Assessment and Plan of chronic medical problems.

## 2018-11-05 NOTE — Assessment & Plan Note (Signed)
In Afib now, has occasional palpitations On metoprolol and eliquis Continue Cmp, cbc, tsh

## 2018-11-05 NOTE — Assessment & Plan Note (Signed)
Taking vitamin d Check level 

## 2018-11-05 NOTE — Patient Instructions (Addendum)
  Tests ordered today. Your results will be released to [facility] (or called to you) after review, usually within 72hours after test completion. If any changes need to be made, you will be notified at that same time.  Medications reviewed and updated.  Changes include :   None.  Your Prolia (osteoporosis medication) is due the end of May and we will call you to schedule this injection.     Please followup in 6 months

## 2018-11-05 NOTE — Assessment & Plan Note (Signed)
Check a1c Low sugar / carb diet Stressed regular exercise   

## 2018-11-08 ENCOUNTER — Ambulatory Visit: Payer: Medicare Other | Admitting: Internal Medicine

## 2018-11-21 ENCOUNTER — Other Ambulatory Visit: Payer: Self-pay | Admitting: Internal Medicine

## 2018-11-21 DIAGNOSIS — M47816 Spondylosis without myelopathy or radiculopathy, lumbar region: Secondary | ICD-10-CM | POA: Diagnosis not present

## 2018-11-21 DIAGNOSIS — I10 Essential (primary) hypertension: Secondary | ICD-10-CM

## 2018-11-27 ENCOUNTER — Encounter

## 2018-11-27 ENCOUNTER — Ambulatory Visit: Payer: Medicare Other | Admitting: Cardiovascular Disease

## 2018-11-29 DIAGNOSIS — H353222 Exudative age-related macular degeneration, left eye, with inactive choroidal neovascularization: Secondary | ICD-10-CM | POA: Diagnosis not present

## 2018-11-29 DIAGNOSIS — H1851 Endothelial corneal dystrophy: Secondary | ICD-10-CM | POA: Diagnosis not present

## 2018-11-29 DIAGNOSIS — H353212 Exudative age-related macular degeneration, right eye, with inactive choroidal neovascularization: Secondary | ICD-10-CM | POA: Diagnosis not present

## 2018-11-29 DIAGNOSIS — H353133 Nonexudative age-related macular degeneration, bilateral, advanced atrophic without subfoveal involvement: Secondary | ICD-10-CM | POA: Diagnosis not present

## 2018-12-05 ENCOUNTER — Other Ambulatory Visit: Payer: Self-pay | Admitting: Internal Medicine

## 2018-12-11 ENCOUNTER — Other Ambulatory Visit: Payer: Self-pay | Admitting: Internal Medicine

## 2018-12-11 NOTE — Telephone Encounter (Signed)
[place] Controlled Database Checked Last filled: 10/31/18 # 60 LOV w/you: 11/05/18 Next appt w/you: 05/06/19

## 2018-12-17 ENCOUNTER — Ambulatory Visit (INDEPENDENT_AMBULATORY_CARE_PROVIDER_SITE_OTHER): Payer: Medicare Other

## 2018-12-17 ENCOUNTER — Other Ambulatory Visit: Payer: Self-pay

## 2018-12-17 DIAGNOSIS — M810 Age-related osteoporosis without current pathological fracture: Secondary | ICD-10-CM | POA: Diagnosis not present

## 2018-12-17 MED ORDER — DENOSUMAB 60 MG/ML ~~LOC~~ SOSY
60.0000 mg | PREFILLED_SYRINGE | Freq: Once | SUBCUTANEOUS | Status: AC
  Administered 2018-12-17: 60 mg via SUBCUTANEOUS

## 2018-12-17 NOTE — Progress Notes (Signed)
prolia Injection given.   [name] Janet Sadao Weyer, MD  

## 2018-12-20 NOTE — Progress Notes (Signed)
Subjective:    Patient ID: [name], female    DOB: May 02, 1930, 85 y.o.   MRN: [id number]  HPI The patient is here for an acute visit.  ? UTI:  Her symptoms started about three days ago.  She states dysuria and possibly not urinating as much as usual.  She denies  urinary frequency, urinary urgency, hematuria, abdominal pain, back pain, nausea, fever.  She does have a history of urinary tract infections and this feels similar to her previous episodes.  Memory difficulties: Her son is with her today and brings up that she has been concerned about her memory and he sees is seeing a difference in her.  She states she does not feel this is that bad and does not feel that further evaluation is necessary.   Medications and allergies reviewed with patient and updated if appropriate.  Patient Active Problem List   Diagnosis Date Noted  . T12 compression fracture ([place]) 04/24/2018  . Femur fracture, right ([place]) 01/14/2018  . Macular degeneration (senile) of retina 06/14/2010  . Vitamin D deficiency 03/10/2009  . Osteoporosis 03/10/2009  . NEOPLASM, MALIGNANT, BLADDER, HX OF 03/10/2009  . 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  . Prediabetes 06/26/2008  . Essential hypertension 08/09/2007  . Paroxysmal atrial fibrillation ([place]) 08/09/2007    Current Outpatient Medications on File Prior to Visit  Medication Sig Dispense Refill  . acetaminophen (TYLENOL) 500 MG tablet Take 500 mg by mouth every 6 (six) hours as needed for moderate pain.    Marland Kitchen amLODipine (NORVASC) 5 MG tablet TAKE 1 TABLET BY MOUTH EVERY DAY FOR BLOOD PRESSURE 90 tablet 1  . calcium carbonate (OS-CAL) 600 MG TABS tablet Take 600 mg by mouth 2 (two) times daily.     Marland Kitchen ELIQUIS 2.5 MG TABS tablet TAKE 1 TABLET BY MOUTH TWICE A DAY 60 tablet 5  . Ergocalciferol (VITAMIN D2) 2000 units TABS Take 2,000 Units by mouth daily.    . metoprolol tartrate (LOPRESSOR) 25 MG tablet TAKE 1 TABLET BY MOUTH TWICE A DAY 180 tablet 1  .  multivitamin-lutein (OCUVITE-LUTEIN) CAPS capsule Take 1 capsule by mouth daily.    . Omega-3 Fatty Acids (FISH OIL) 1000 MG CAPS Take 100 mg by mouth 2 (two) times daily.     . traMADol (ULTRAM) 50 MG tablet TAKE 1 TABLET BY MOUTH EVERY 8 HOURS AS NEEDED FOR MODERATE PAIN 60 tablet 0  . tretinoin (RETIN-A) 0.05 % cream Apply 1 application topically 2 (two) times a week.      No current facility-administered medications on file prior to visit.     Past Medical History:  Diagnosis Date  . Anemia   . Arthritis    "back" (01/16/2018)  . Atrial fibrillation ([place])   . Cancer [facility])    low grade papillary, most recent recurrence 2011,  Dr Karsten Ro  . 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 History of blood transfusion 01/15/2018   S/P OR  . Hypertension   . Hyponatremia 01/26/12   Na+ 131  . Macular degeneration, bilateral    "were wet; dry now" (01/16/2018)  . Other abnormal glucose 06/14/12   Fasting blood glucose 128; A1c 5.8% on 12/15/11    Past Surgical History:  Procedure Laterality Date  . ABDOMINAL HYSTERECTOMY  1970s   with oopherectomy for incidenctal cyst (no PMH of abnormal PAP)  . APPENDECTOMY     1970s  . BLADDER TUMOR EXCISION  2011 & 2013   Dr Karsten Ro  .  CATARACT EXTRACTION, BILATERAL  2014   Dr Herbert Deaner  . COLONOSCOPY     negative; Dr Olevia Perches  . CYSTOSCOPY      multiple since tumor excision;Dr Ottelin  . DILATION AND CURETTAGE OF UTERUS    . FRACTURE SURGERY    . INTRAMEDULLARY (IM) NAIL INTERTROCHANTERIC Right 01/15/2018   Procedure: INTRAMEDULLARY (IM) NAIL INTERTROCHANTRIC;  Surgeon: Melrose Nakayama, MD;  Location: [place];  Service: Orthopedics;  Laterality: Right;  . KYPHOPLASTY N/A 03/08/2018   Procedure: THORACIC 12 KYPHOPLASTY;  Surgeon: Phylliss Bob, MD;  Location: [place];  Service: Orthopedics;  Laterality: N/A;  . TONSILLECTOMY      Social History   Socioeconomic History  . Marital status: Widowed    Spouse name: Not on file  . Number of  children: 2  . Years of education: Not on file  . Highest education level: Not on file  Occupational History  . Occupation: Retired  Scientific laboratory technician  . Financial resource strain: Not hard at all  . Food insecurity:    Worry: Never true    Inability: Never true  . Transportation needs:    Medical: No    Non-medical: No  Tobacco Use  . Smoking status: Never Smoker  . Smokeless tobacco: Never Used  Substance and Sexual Activity  . Alcohol use: Never    Alcohol/week: 0.0 standard drinks    Frequency: Never  . Drug use: Never  . Sexual activity: Never  Lifestyle  . Physical activity:    Days per week: 3 days    Minutes per session: 30 min  . Stress: Not at all  Relationships  . Social connections:    Talks on phone: More than three times a week    Gets together: More than three times a week    Attends religious service: More than 4 times per year    Active member of club or organization: Yes    Attends meetings of clubs or organizations: More than 4 times per year    Relationship status: Widowed  Other Topics Concern  . Not on file  Social History Narrative  . Not on file    Family History  Problem Relation Age of Onset  . Stroke Mother 53  . Heart attack Father 24  . Coronary artery disease Brother        S/P CBAG ? @ 60  . Diabetes Brother   . Kidney disease Neg Hx   . Cancer Neg Hx   . Colon cancer Neg Hx   . Colon polyps Neg Hx     Review of Systems Per HPI    Objective:   Vitals:   12/21/18 1043  BP: (!) 146/82  Pulse: 60  Resp: 16  Temp: 98.1 F (36.7 C)  SpO2: 99%   BP Readings from Last 3 Encounters:  12/21/18 (!) 146/82  11/05/18 (!) 142/84  09/27/18 132/66   Wt Readings from Last 3 Encounters:  12/21/18 96 lb 1.9 oz (43.6 kg)  11/05/18 96 lb (43.5 kg)  09/27/18 97 lb (44 kg)   Body mass index is 16.5 kg/m.   Physical Exam Constitutional:      General: She is not in acute distress.    Appearance: Normal appearance. She is not  ill-appearing.  HENT:     Head: Normocephalic and atraumatic.  Abdominal:     General: There is no distension.     Tenderness: There is no abdominal tenderness. There is no right CVA tenderness or left CVA  tenderness.  Skin:    General: Skin is warm and dry.  Neurological:     Mental Status: She is alert.            Assessment & Plan:    See Problem List for Assessment and Plan of chronic medical problems.

## 2018-12-21 ENCOUNTER — Encounter: Payer: Self-pay | Admitting: Internal Medicine

## 2018-12-21 ENCOUNTER — Other Ambulatory Visit: Payer: Self-pay

## 2018-12-21 ENCOUNTER — Ambulatory Visit (INDEPENDENT_AMBULATORY_CARE_PROVIDER_SITE_OTHER): Payer: Medicare Other | Admitting: Internal Medicine

## 2018-12-21 ENCOUNTER — Other Ambulatory Visit: Payer: Medicare Other

## 2018-12-21 VITALS — BP 146/82 | HR 60 | Temp 98.1°F | Resp 16 | Ht 64.0 in | Wt 96.1 lb

## 2018-12-21 DIAGNOSIS — R413 Other amnesia: Secondary | ICD-10-CM | POA: Diagnosis not present

## 2018-12-21 DIAGNOSIS — R300 Dysuria: Secondary | ICD-10-CM

## 2018-12-21 DIAGNOSIS — N3000 Acute cystitis without hematuria: Secondary | ICD-10-CM | POA: Diagnosis not present

## 2018-12-21 LAB — POCT URINALYSIS DIPSTICK
Bilirubin, UA: NEGATIVE
Glucose, UA: NEGATIVE
Ketones, UA: NEGATIVE
Nitrite, UA: NEGATIVE
Protein, UA: NEGATIVE
Spec Grav, UA: 1.01 (ref 1.010–1.025)
Urobilinogen, UA: NEGATIVE E.U./dL — AB
pH, UA: 7 (ref 5.0–8.0)

## 2018-12-21 MED ORDER — CIPROFLOXACIN HCL 250 MG PO TABS: 250.0000 mg | ORAL_TABLET | Freq: Two times a day (BID) | ORAL | 0 refills | Status: DC

## 2018-12-21 NOTE — Patient Instructions (Signed)
Take the antibiotic as prescribed.  Take tylenol if needed.     Increase your water intake.   Call if no improvement     Urinary Tract Infection, Adult A urinary tract infection (UTI) is an infection of any part of the urinary tract, which includes the kidneys, ureters, bladder, and urethra. These organs make, store, and get rid of urine in the body. UTI can be a bladder infection (cystitis) or kidney infection (pyelonephritis). What are the causes? This infection may be caused by fungi, viruses, or bacteria. Bacteria are the most common cause of UTIs. This condition can also be caused by repeated incomplete emptying of the bladder during urination. What increases the risk? This condition is more likely to develop if:  You ignore your need to urinate or hold urine for long periods of time.  You do not empty your bladder completely during urination.  You wipe back to front after urinating or having a bowel movement, if you are female.  You are uncircumcised, if you are female.  You are constipated.  You have a urinary catheter that stays in place (indwelling).  You have a weak defense (immune) system.  You have a medical condition that affects your bowels, kidneys, or bladder.  You have diabetes.  You take antibiotic medicines frequently or for long periods of time, and the antibiotics no longer work well against certain types of infections (antibiotic resistance).  You take medicines that irritate your urinary tract.  You are exposed to chemicals that irritate your urinary tract.  You are female.  What are the signs or symptoms? Symptoms of this condition include:  Fever.  Frequent urination or passing small amounts of urine frequently.  Needing to urinate urgently.  Pain or burning with urination.  Urine that smells bad or unusual.  Cloudy urine.  Pain in the lower abdomen or back.  Trouble urinating.  Blood in the urine.  Vomiting or being less hungry than  normal.  Diarrhea or abdominal pain.  Vaginal discharge, if you are female.  How is this diagnosed? This condition is diagnosed with a medical history and physical exam. You will also need to provide a urine sample to test your urine. Other tests may be done, including:  Blood tests.  Sexually transmitted disease (STD) testing.  If you have had more than one UTI, a cystoscopy or imaging studies may be done to determine the cause of the infections. How is this treated? Treatment for this condition often includes a combination of two or more of the following:  Antibiotic medicine.  Other medicines to treat less common causes of UTI.  Over-the-counter medicines to treat pain.  Drinking enough water to stay hydrated.  Follow these instructions at home:  Take over-the-counter and prescription medicines only as told by your health care provider.  If you were prescribed an antibiotic, take it as told by your health care provider. Do not stop taking the antibiotic even if you start to feel better.  Avoid alcohol, caffeine, tea, and carbonated beverages. They can irritate your bladder.  Drink enough fluid to keep your urine clear or pale yellow.  Keep all follow-up visits as told by your health care provider. This is important.  Make sure to: ? Empty your bladder often and completely. Do not hold urine for long periods of time. ? Empty your bladder before and after sex. ? Wipe from front to back after a bowel movement if you are female. Use each tissue one time when you   wipe. Contact a health care provider if:  You have back pain.  You have a fever.  You feel nauseous or vomit.  Your symptoms do not get better after 3 days.  Your symptoms go away and then return. Get help right away if:  You have severe back pain or lower abdominal pain.  You are vomiting and cannot keep down any medicines or water. This information is not intended to replace advice given to you by  your health care provider. Make sure you discuss any questions you have with your health care provider. Document Released: 04/13/2005 Document Revised: 12/16/2015 Document Reviewed: 05/25/2015 Elsevier Interactive Patient Education  2018 Elsevier Inc.   

## 2018-12-21 NOTE — Assessment & Plan Note (Signed)
Her son brought up that there has been some memory difficulties-she has mentioned this and he is also concerned She does not think it is bad enough to warrant further evaluation Her son and I discussed with her that there are several causes for memory issues and that we should evaluate this further and see how bad her memory is and see if she has reversible causes of memory problems and to help make sure it does not get worse Referred to neurology

## 2018-12-21 NOTE — Assessment & Plan Note (Signed)
Urine dip consistent with UTI Will send urine for culture Take the antibiotic as prescribed.  Given Cipro because she has taken this in the past and she has tolerated it well and it has been effective Take tylenol if needed.   Increase your water intake.  Call if no improvement

## 2018-12-23 LAB — URINE CULTURE
MICRO NUMBER:: 541672
SPECIMEN QUALITY:: ADEQUATE

## 2019-01-23 DIAGNOSIS — M79601 Pain in right arm: Secondary | ICD-10-CM | POA: Diagnosis not present

## 2019-01-23 DIAGNOSIS — S42201A Unspecified fracture of upper end of right humerus, initial encounter for closed fracture: Secondary | ICD-10-CM | POA: Diagnosis not present

## 2019-01-29 DIAGNOSIS — M79601 Pain in right arm: Secondary | ICD-10-CM | POA: Diagnosis not present

## 2019-02-01 DIAGNOSIS — M79601 Pain in right arm: Secondary | ICD-10-CM | POA: Diagnosis not present

## 2019-02-13 ENCOUNTER — Telehealth: Payer: Self-pay | Admitting: Internal Medicine

## 2019-02-13 NOTE — Telephone Encounter (Signed)
Pt's daughter called in to request to have a home health assessment completed. Daughter says that they are unable to take care of her completely and would like to look into having someone come to the home to assist.    CBSharyn Lull -- [phone] pt's daughter   Please assist

## 2019-02-13 NOTE — Telephone Encounter (Signed)
Spoke with daughter to get more information. Pt has a broken broken arm x3 weeks. I advised pt follow up with ortho to determine home health needs such at PT or etc. Daughter understood.

## 2019-02-14 DIAGNOSIS — M79601 Pain in right arm: Secondary | ICD-10-CM | POA: Diagnosis not present

## 2019-02-19 DIAGNOSIS — M25611 Stiffness of right shoulder, not elsewhere classified: Secondary | ICD-10-CM | POA: Diagnosis not present

## 2019-02-19 DIAGNOSIS — M25621 Stiffness of right elbow, not elsewhere classified: Secondary | ICD-10-CM | POA: Diagnosis not present

## 2019-02-19 DIAGNOSIS — S42301D Unspecified fracture of shaft of humerus, right arm, subsequent encounter for fracture with routine healing: Secondary | ICD-10-CM | POA: Diagnosis not present

## 2019-02-22 DIAGNOSIS — M25621 Stiffness of right elbow, not elsewhere classified: Secondary | ICD-10-CM | POA: Diagnosis not present

## 2019-02-22 DIAGNOSIS — S42301D Unspecified fracture of shaft of humerus, right arm, subsequent encounter for fracture with routine healing: Secondary | ICD-10-CM | POA: Diagnosis not present

## 2019-02-22 DIAGNOSIS — M25611 Stiffness of right shoulder, not elsewhere classified: Secondary | ICD-10-CM | POA: Diagnosis not present

## 2019-02-27 DIAGNOSIS — M25611 Stiffness of right shoulder, not elsewhere classified: Secondary | ICD-10-CM | POA: Diagnosis not present

## 2019-02-27 DIAGNOSIS — S42301D Unspecified fracture of shaft of humerus, right arm, subsequent encounter for fracture with routine healing: Secondary | ICD-10-CM | POA: Diagnosis not present

## 2019-02-27 DIAGNOSIS — M25621 Stiffness of right elbow, not elsewhere classified: Secondary | ICD-10-CM | POA: Diagnosis not present

## 2019-02-28 DIAGNOSIS — M25611 Stiffness of right shoulder, not elsewhere classified: Secondary | ICD-10-CM | POA: Diagnosis not present

## 2019-03-04 ENCOUNTER — Other Ambulatory Visit: Payer: Self-pay | Admitting: Internal Medicine

## 2019-03-04 MED ORDER — AMLODIPINE BESYLATE 5 MG PO TABS: ORAL_TABLET | ORAL | 0 refills | Status: DC

## 2019-03-04 NOTE — Telephone Encounter (Signed)
Copied from [place] [id number]. Topic: Quick Communication - Rx Refill/Question >> Mar 04, 2019 10:41 AM Yvette Rack wrote: Medication: amLODipine (NORVASC) 5 MG tablet  Has the patient contacted their pharmacy? no  Preferred Pharmacy (with phone number or street name): [id number] [address], [place] [phone] (Phone) [phone] (Fax)  Agent: Please be advised that RX refills may take up to 3 business days. We ask that you follow-up with your pharmacy.

## 2019-03-08 ENCOUNTER — Other Ambulatory Visit: Payer: Self-pay | Admitting: Internal Medicine

## 2019-03-08 DIAGNOSIS — S42301D Unspecified fracture of shaft of humerus, right arm, subsequent encounter for fracture with routine healing: Secondary | ICD-10-CM | POA: Diagnosis not present

## 2019-03-08 DIAGNOSIS — M25621 Stiffness of right elbow, not elsewhere classified: Secondary | ICD-10-CM | POA: Diagnosis not present

## 2019-03-08 DIAGNOSIS — M25611 Stiffness of right shoulder, not elsewhere classified: Secondary | ICD-10-CM | POA: Diagnosis not present

## 2019-03-08 NOTE — Telephone Encounter (Signed)
Medication Refill - Medication: ELIQUIS 2.5 MG TABS tablet   Has the patient contacted their pharmacy? No. Pts daughter states pt only has one more pill. Please advise.  (Agent: If no, request that the patient contact the pharmacy for the refill.) (Agent: If yes, when and what did the pharmacy advise?)  Preferred Pharmacy (with phone number or street name):  [id number] [address], [place] [address]  Phone: [phone] Fax: [phone]  Not a 24 hour pharmacy; exact hours not known.     Agent: Please be advised that RX refills may take up to 3 business days. We ask that you follow-up with your pharmacy.

## 2019-03-15 DIAGNOSIS — M25611 Stiffness of right shoulder, not elsewhere classified: Secondary | ICD-10-CM | POA: Diagnosis not present

## 2019-03-15 DIAGNOSIS — M25621 Stiffness of right elbow, not elsewhere classified: Secondary | ICD-10-CM | POA: Diagnosis not present

## 2019-03-15 DIAGNOSIS — S42301D Unspecified fracture of shaft of humerus, right arm, subsequent encounter for fracture with routine healing: Secondary | ICD-10-CM | POA: Diagnosis not present

## 2019-03-21 DIAGNOSIS — M25611 Stiffness of right shoulder, not elsewhere classified: Secondary | ICD-10-CM | POA: Diagnosis not present

## 2019-03-28 ENCOUNTER — Ambulatory Visit: Payer: Medicare Other | Admitting: Cardiovascular Disease

## 2019-03-28 NOTE — Progress Notes (Deleted)
Cardiology Office Note   Date:  03/28/2019   ID:  [name], DOB 1929/10/18, MRN [id number]  PCP:  Binnie Rail, MD  Cardiologist:   Skeet Latch, MD   No chief complaint on file.     History of Present Illness: [name] is a 85 y.o. female with paroxysmal atrial fibrillation, hypertension, who presents for ***    Past Medical History:  Diagnosis Date  . Anemia   . Arthritis    "back" (01/16/2018)  . Atrial fibrillation ([place])   . Cancer [facility])    low grade papillary, most recent recurrence 2011,  Dr Karsten Ro  . 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 History of blood transfusion 01/15/2018   S/P OR  . Hypertension   . Hyponatremia 01/26/12   Na+ 131  . Macular degeneration, bilateral    "were wet; dry now" (01/16/2018)  . Other abnormal glucose 06/14/12   Fasting blood glucose 128; A1c 5.8% on 12/15/11    Past Surgical History:  Procedure Laterality Date  . ABDOMINAL HYSTERECTOMY  1970s   with oopherectomy for incidenctal cyst (no PMH of abnormal PAP)  . APPENDECTOMY     1970s  . BLADDER TUMOR EXCISION  2011 & 2013   Dr Karsten Ro  . CATARACT EXTRACTION, BILATERAL  2014   Dr Herbert Deaner  . COLONOSCOPY     negative; Dr Olevia Perches  . CYSTOSCOPY      multiple since tumor excision;Dr Ottelin  . DILATION AND CURETTAGE OF UTERUS    . FRACTURE SURGERY    . INTRAMEDULLARY (IM) NAIL INTERTROCHANTERIC Right 01/15/2018   Procedure: INTRAMEDULLARY (IM) NAIL INTERTROCHANTRIC;  Surgeon: Melrose Nakayama, MD;  Location: [place];  Service: Orthopedics;  Laterality: Right;  . KYPHOPLASTY N/A 03/08/2018   Procedure: THORACIC 12 KYPHOPLASTY;  Surgeon: Phylliss Bob, MD;  Location: [place];  Service: Orthopedics;  Laterality: N/A;  . TONSILLECTOMY       Current Outpatient Medications  Medication Sig Dispense Refill  . acetaminophen (TYLENOL) 500 MG tablet Take 500 mg by mouth every 6 (six) hours as needed for moderate pain.    Marland Kitchen amLODipine (NORVASC) 5 MG tablet  TAKE 1 TABLET BY MOUTH EVERY DAY FOR BLOOD PRESSURE 90 tablet 0  . calcium carbonate (OS-CAL) 600 MG TABS tablet Take 600 mg by mouth 2 (two) times daily.     . ciprofloxacin (CIPRO) 250 MG tablet Take 1 tablet (250 mg total) by mouth 2 (two) times daily. 10 tablet 0  . ELIQUIS 2.5 MG TABS tablet TAKE 1 TABLET BY MOUTH TWICE A DAY 60 tablet 5  . Ergocalciferol (VITAMIN D2) 2000 units TABS Take 2,000 Units by mouth daily.    . metoprolol tartrate (LOPRESSOR) 25 MG tablet TAKE 1 TABLET BY MOUTH TWICE A DAY 180 tablet 1  . multivitamin-lutein (OCUVITE-LUTEIN) CAPS capsule Take 1 capsule by mouth daily.    . Omega-3 Fatty Acids (FISH OIL) 1000 MG CAPS Take 100 mg by mouth 2 (two) times daily.     . traMADol (ULTRAM) 50 MG tablet TAKE 1 TABLET BY MOUTH EVERY 8 HOURS AS NEEDED FOR MODERATE PAIN 60 tablet 0  . tretinoin (RETIN-A) 0.05 % cream Apply 1 application topically 2 (two) times a week.      No current facility-administered medications for this visit.     Allergies:   Patient has no known allergies.    Social History:  The patient  reports that she has never smoked. She has  never used smokeless tobacco. She reports that she does not drink alcohol or use drugs.   Family History:  The patient's ***family history includes Coronary artery disease in her brother; Diabetes in her brother; Heart attack (age of onset: 69) in her father; Stroke (age of onset: 53) in her mother.    ROS:  Please see the history of present illness.   Otherwise, review of systems are positive for {NONE DEFAULTED:18576::"none"}.   All other systems are reviewed and negative.    PHYSICAL EXAM: VS:  There were no vitals taken for this visit. , BMI There is no height or weight on file to calculate BMI. GENERAL:  Well appearing HEENT:  Pupils equal round and reactive, fundi not visualized, oral mucosa unremarkable NECK:  No jugular venous distention, waveform within normal limits, carotid upstroke brisk and symmetric, no  bruits, no thyromegaly LYMPHATICS:  No cervical adenopathy LUNGS:  Clear to auscultation bilaterally HEART:  RRR.  PMI not displaced or sustained,S1 and S2 within normal limits, no S3, no S4, no clicks, no rubs, *** murmurs ABD:  Flat, positive bowel sounds normal in frequency in pitch, no bruits, no rebound, no guarding, no midline pulsatile mass, no hepatomegaly, no splenomegaly EXT:  2 plus pulses throughout, no edema, no cyanosis no clubbing SKIN:  No rashes no nodules NEURO:  Cranial nerves II through XII grossly intact, motor grossly intact throughout PSYCH:  Cognitively intact, oriented to person place and time    EKG:  EKG {ACTION; IS/IS [id number] ordered today. The ekg ordered today demonstrates ***   Recent Labs: 03/29/2018: B Natriuretic Peptide 105.6 11/05/2018: ALT 11; BUN 19; Creatinine, Ser 0.77; Hemoglobin 13.9; Platelets 296.0; Potassium 4.1; Sodium 137; TSH 2.70    Lipid Panel    Component Value Date/Time   CHOL 209 (H) 01/24/2017 0956   TRIG 68.0 01/24/2017 0956   HDL 80.00 01/24/2017 0956   CHOLHDL 3 01/24/2017 0956   VLDL 13.6 01/24/2017 0956   LDLCALC 115 (H) 01/24/2017 0956   LDLDIRECT 122.9 06/14/2010 0912      Wt Readings from Last 3 Encounters:  12/21/18 96 lb 1.9 oz (43.6 kg)  11/05/18 96 lb (43.5 kg)  09/27/18 97 lb (44 kg)      ASSESSMENT AND PLAN:  ***   Current medicines are reviewed at length with the patient today.  The patient {ACTIONS; HAS/DOES NOT HAVE:19233} concerns regarding medicines.  The following changes have been made:  {PLAN; NO CHANGE:13088:s}  Labs/ tests ordered today include: *** No orders of the defined types were placed in this encounter.    Disposition:   FU with ***     Signed, Jad Johansson C. Oval Linsey, MD, [facility]  03/28/2019 1:15 PM    [facility]

## 2019-04-01 DIAGNOSIS — S42301D Unspecified fracture of shaft of humerus, right arm, subsequent encounter for fracture with routine healing: Secondary | ICD-10-CM | POA: Diagnosis not present

## 2019-04-01 DIAGNOSIS — M25611 Stiffness of right shoulder, not elsewhere classified: Secondary | ICD-10-CM | POA: Diagnosis not present

## 2019-04-01 DIAGNOSIS — M25621 Stiffness of right elbow, not elsewhere classified: Secondary | ICD-10-CM | POA: Diagnosis not present

## 2019-04-05 ENCOUNTER — Other Ambulatory Visit: Payer: Self-pay | Admitting: Internal Medicine

## 2019-04-09 DIAGNOSIS — M25621 Stiffness of right elbow, not elsewhere classified: Secondary | ICD-10-CM | POA: Diagnosis not present

## 2019-04-09 DIAGNOSIS — S42301D Unspecified fracture of shaft of humerus, right arm, subsequent encounter for fracture with routine healing: Secondary | ICD-10-CM | POA: Diagnosis not present

## 2019-04-09 DIAGNOSIS — M25611 Stiffness of right shoulder, not elsewhere classified: Secondary | ICD-10-CM | POA: Diagnosis not present

## 2019-04-18 DIAGNOSIS — S42301D Unspecified fracture of shaft of humerus, right arm, subsequent encounter for fracture with routine healing: Secondary | ICD-10-CM | POA: Diagnosis not present

## 2019-04-20 ENCOUNTER — Other Ambulatory Visit: Payer: Self-pay | Admitting: Internal Medicine

## 2019-04-20 DIAGNOSIS — I10 Essential (primary) hypertension: Secondary | ICD-10-CM

## 2019-04-29 ENCOUNTER — Other Ambulatory Visit: Payer: Self-pay | Admitting: Internal Medicine

## 2019-04-29 NOTE — Telephone Encounter (Signed)
 Controlled Database Checked Last filled: 02/15/19 # 30 LOV w/you: 12/21/18 Next appt w/you: 05/06/19

## 2019-05-05 NOTE — Assessment & Plan Note (Addendum)
Last injection 12/17/18 Taking calcium and vitamin d Doing some exercises Fall prevention Check vitamin D level

## 2019-05-05 NOTE — Patient Instructions (Addendum)
  Tests ordered today. Your results will be released to MyChart (or called to you) after review.  If any changes need to be made, you will be notified at that same time.  Flu immunization administered today.    Medications reviewed and updated.  Changes include :  none      Please followup in 6 months   

## 2019-05-05 NOTE — Progress Notes (Signed)
Subjective:    Patient ID: [name], female    DOB: [birth year], 85 y.o.   MRN: [id number]  HPI The patient is here for follow up.  Her son is with her.    She is exercising regularly.    Memory concerns:  Her son still reports memory concerns.  There has been some decline.  She did not see neurology because it was only telehealth.  Her sone does not feel it is needed to be done at this point.    Afib, Hypertension: She is taking her medication daily. She is compliant with a low sodium diet.  She denies chest pain, palpitations, edema, shortness of breath and regular headaches.     Osteoporosis:  She is getting prolia and her last injection was 12/17/18.  She is taking calcium and vitamin d daily.  She does some exercises at home.   Chronic back pain from T12 compression fx:  Her pain is chronic.  She takes tylenol as needed.  She takes tramadol prn for more severe pain.    prediabetes:  She is compliant with a low sugar/carbohydrate diet.  She is exercising regularly.   Medications and allergies reviewed with patient and updated if appropriate.  Patient Active Problem List   Diagnosis Date Noted  . Memory difficulties 12/21/2018  . Acute cystitis without hematuria 12/21/2018  . T12 compression fracture ([place]) 04/24/2018  . Femur fracture, right ([place]) 01/14/2018  . Macular degeneration (senile) of retina 06/14/2010  . Vitamin D deficiency 03/10/2009  . Osteoporosis 03/10/2009  . NEOPLASM, MALIGNANT, BLADDER, HX OF 03/10/2009  . 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  . Prediabetes 06/26/2008  . Essential hypertension 08/09/2007  . Paroxysmal atrial fibrillation ([place]) 08/09/2007    Current Outpatient Medications on File Prior to Visit  Medication Sig Dispense Refill  . acetaminophen (TYLENOL) 500 MG tablet Take 500 mg by mouth every 6 (six) hours as needed for moderate pain.    Marland Kitchen amLODipine (NORVASC) 5 MG tablet TAKE 1 TABLET BY MOUTH EVERY DAY FOR BLOOD PRESSURE 90 tablet 0  .  calcium carbonate (OS-CAL) 600 MG TABS tablet Take 600 mg by mouth 2 (two) times daily.     Marland Kitchen ELIQUIS 2.5 MG TABS tablet TAKE 1 TABLET BY MOUTH TWICE A DAY 60 tablet 5  . Ergocalciferol (VITAMIN D2) 2000 units TABS Take 2,000 Units by mouth daily.    . metoprolol tartrate (LOPRESSOR) 25 MG tablet TAKE 1 TABLET BY MOUTH TWICE A DAY 180 tablet 1  . multivitamin-lutein (OCUVITE-LUTEIN) CAPS capsule Take 1 capsule by mouth daily.    . Omega-3 Fatty Acids (FISH OIL) 1000 MG CAPS Take 100 mg by mouth 2 (two) times daily.     . traMADol (ULTRAM) 50 MG tablet TAKE 1 TABLET BY MOUTH EVERY 8 HOURS AS NEEDED FOR MODERATE PAIN 60 tablet 0  . tretinoin (RETIN-A) 0.05 % cream Apply 1 application topically 2 (two) times a week.      No current facility-administered medications on file prior to visit.     Past Medical History:  Diagnosis Date  . Anemia   . Arthritis    "back" (01/16/2018)  . Atrial fibrillation ([place])   . Cancer [facility])    low grade papillary, most recent recurrence 2011,  Dr Karsten Ro  . 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 History of blood transfusion 01/15/2018   S/P OR  . Hypertension   . Hyponatremia 01/26/12   Na+ 131  . Macular degeneration,  bilateral    "were wet; dry now" (01/16/2018)  . Other abnormal glucose 06/14/12   Fasting blood glucose 128; A1c 5.8% on 12/15/11    Past Surgical History:  Procedure Laterality Date  . ABDOMINAL HYSTERECTOMY  1970s   with oopherectomy for incidenctal cyst (no PMH of abnormal PAP)  . APPENDECTOMY     1970s  . BLADDER TUMOR EXCISION  2011 & 2013   Dr Karsten Ro  . CATARACT EXTRACTION, BILATERAL  2014   Dr Herbert Deaner  . COLONOSCOPY     negative; Dr Olevia Perches  . CYSTOSCOPY      multiple since tumor excision;Dr Ottelin  . DILATION AND CURETTAGE OF UTERUS    . FRACTURE SURGERY    . INTRAMEDULLARY (IM) NAIL INTERTROCHANTERIC Right 01/15/2018   Procedure: INTRAMEDULLARY (IM) NAIL INTERTROCHANTRIC;  Surgeon: Melrose Nakayama, MD;   Location: [place];  Service: Orthopedics;  Laterality: Right;  . KYPHOPLASTY N/A 03/08/2018   Procedure: THORACIC 12 KYPHOPLASTY;  Surgeon: Phylliss Bob, MD;  Location: [place];  Service: Orthopedics;  Laterality: N/A;  . TONSILLECTOMY      Social History   Socioeconomic History  . Marital status: Widowed    Spouse name: Not on file  . Number of children: 2  . Years of education: Not on file  . Highest education level: Not on file  Occupational History  . Occupation: Retired  Scientific laboratory technician  . Financial resource strain: Not hard at all  . Food insecurity    Worry: Never true    Inability: Never true  . Transportation needs    Medical: No    Non-medical: No  Tobacco Use  . Smoking status: Never Smoker  . Smokeless tobacco: Never Used  Substance and Sexual Activity  . Alcohol use: Never    Alcohol/week: 0.0 standard drinks    Frequency: Never  . Drug use: Never  . Sexual activity: Never  Lifestyle  . Physical activity    Days per week: 3 days    Minutes per session: 30 min  . Stress: Not at all  Relationships  . Social connections    Talks on phone: More than three times a week    Gets together: More than three times a week    Attends religious service: More than 4 times per year    Active member of club or organization: Yes    Attends meetings of clubs or organizations: More than 4 times per year    Relationship status: Widowed  Other Topics Concern  . Not on file  Social History Narrative  . Not on file    Family History  Problem Relation Age of Onset  . Stroke Mother 74  . Heart attack Father 14  . Coronary artery disease Brother        S/P CBAG ? @ 60  . Diabetes Brother   . Kidney disease Neg Hx   . Cancer Neg Hx   . Colon cancer Neg Hx   . Colon polyps Neg Hx     Review of Systems  Constitutional: Negative for fatigue and fever.  Respiratory: Negative for cough, shortness of breath and wheezing.   Cardiovascular: Positive for leg swelling (mild right  ankle). Negative for chest pain and palpitations.  Gastrointestinal: Negative for abdominal pain and nausea.  Neurological: Negative for light-headedness and headaches.       Objective:   Vitals:   05/06/19 1001  BP: (!) 142/78  Pulse: 100  Resp: 16  Temp: 98 F (36.7 C)  SpO2: 99%   BP Readings from Last 3 Encounters:  05/06/19 (!) 142/78  12/21/18 (!) 146/82  11/05/18 (!) 142/84   Wt Readings from Last 3 Encounters:  05/06/19 102 lb (46.3 kg)  12/21/18 96 lb 1.9 oz (43.6 kg)  11/05/18 96 lb (43.5 kg)   Body mass index is 17.51 kg/m.   Physical Exam    Constitutional: Appears well-developed and well-nourished. No distress.  HENT:  Head: Normocephalic and atraumatic.  Neck: Neck supple. No tracheal deviation present. No thyromegaly present.  No cervical lymphadenopathy Cardiovascular: Normal rate, irregular rhythm and normal heart sounds.   No murmur heard. No carotid bruit .  Mild 1+ pitting right lower extremity edema.  Trace left lower extremity edema.  Bilateral extremities with mild chronic skin changes-hyperpigmentation from LIMA Pulmonary/Chest: Effort normal and breath sounds normal. No respiratory distress. No has no wheezes. No rales.  Skin: Skin is warm and dry. Not diaphoretic.  Psychiatric: Normal mood and affect. Behavior is normal.      Assessment & Plan:    See Problem List for Assessment and Plan of chronic medical problems.

## 2019-05-06 ENCOUNTER — Encounter: Payer: Self-pay | Admitting: Internal Medicine

## 2019-05-06 ENCOUNTER — Other Ambulatory Visit: Payer: Self-pay

## 2019-05-06 ENCOUNTER — Other Ambulatory Visit (INDEPENDENT_AMBULATORY_CARE_PROVIDER_SITE_OTHER): Payer: Medicare Other

## 2019-05-06 ENCOUNTER — Ambulatory Visit (INDEPENDENT_AMBULATORY_CARE_PROVIDER_SITE_OTHER): Payer: Medicare Other | Admitting: Internal Medicine

## 2019-05-06 VITALS — BP 142/78 | HR 100 | Temp 98.0°F | Resp 16 | Ht 64.0 in | Wt 102.0 lb

## 2019-05-06 DIAGNOSIS — R413 Other amnesia: Secondary | ICD-10-CM

## 2019-05-06 DIAGNOSIS — S22080D Wedge compression fracture of T11-T12 vertebra, subsequent encounter for fracture with routine healing: Secondary | ICD-10-CM

## 2019-05-06 DIAGNOSIS — Z23 Encounter for immunization: Secondary | ICD-10-CM

## 2019-05-06 DIAGNOSIS — I480 Paroxysmal atrial fibrillation: Secondary | ICD-10-CM

## 2019-05-06 DIAGNOSIS — M810 Age-related osteoporosis without current pathological fracture: Secondary | ICD-10-CM

## 2019-05-06 DIAGNOSIS — R7303 Prediabetes: Secondary | ICD-10-CM

## 2019-05-06 DIAGNOSIS — I10 Essential (primary) hypertension: Secondary | ICD-10-CM | POA: Diagnosis not present

## 2019-05-06 LAB — CBC WITH DIFFERENTIAL/PLATELET
Basophils Absolute: 0 10*3/uL (ref 0.0–0.1)
Basophils Relative: 0.7 % (ref 0.0–3.0)
Eosinophils Absolute: 0.1 10*3/uL (ref 0.0–0.7)
Eosinophils Relative: 1.2 % (ref 0.0–5.0)
HCT: 41.9 % (ref 36.0–46.0)
Hemoglobin: 13.7 g/dL (ref 12.0–15.0)
Lymphocytes Relative: 21.2 % (ref 12.0–46.0)
Lymphs Abs: 1.2 10*3/uL (ref 0.7–4.0)
MCHC: 32.8 g/dL (ref 30.0–36.0)
MCV: 94.2 fl (ref 78.0–100.0)
Monocytes Absolute: 0.7 10*3/uL (ref 0.1–1.0)
Monocytes Relative: 12.3 % — ABNORMAL HIGH (ref 3.0–12.0)
Neutro Abs: 3.6 10*3/uL (ref 1.4–7.7)
Neutrophils Relative %: 64.6 % (ref 43.0–77.0)
Platelets: 305 10*3/uL (ref 150.0–400.0)
RBC: 4.44 Mil/uL (ref 3.87–5.11)
RDW: 13.8 % (ref 11.5–15.5)
WBC: 5.5 10*3/uL (ref 4.0–10.5)

## 2019-05-06 LAB — COMPREHENSIVE METABOLIC PANEL
ALT: 15 U/L (ref 0–35)
AST: 16 U/L (ref 0–37)
Albumin: 4.1 g/dL (ref 3.5–5.2)
Alkaline Phosphatase: 105 U/L (ref 39–117)
BUN: 17 mg/dL (ref 6–23)
CO2: 28 mEq/L (ref 19–32)
Calcium: 9.4 mg/dL (ref 8.4–10.5)
Chloride: 102 mEq/L (ref 96–112)
Creatinine, Ser: 0.76 mg/dL (ref 0.40–1.20)
GFR: 71.94 mL/min (ref >60.00)
Glucose, Bld: 105 mg/dL — ABNORMAL HIGH (ref 70–99)
Potassium: 5.2 mEq/L — ABNORMAL HIGH (ref 3.5–5.1)
Sodium: 136 mEq/L (ref 135–145)
Total Bilirubin: 0.5 mg/dL (ref 0.2–1.2)
Total Protein: 7.3 g/dL (ref 6.0–8.3)

## 2019-05-06 LAB — HEMOGLOBIN A1C: Hgb A1c MFr Bld: 6.5 % (ref 4.6–6.5)

## 2019-05-06 LAB — VITAMIN D 25 HYDROXY (VIT D DEFICIENCY, FRACTURES): VITD: 29.99 ng/mL — ABNORMAL LOW (ref 30.00–100.00)

## 2019-05-06 NOTE — Assessment & Plan Note (Signed)
In A. fib on exam, rate on higher side when she first came in, but better controlled when I listen to her On Eliquis, metoprolol CBC, CMP

## 2019-05-06 NOTE — Assessment & Plan Note (Signed)
Check a1c Low sugar / carb diet Stressed regular exercise   

## 2019-05-06 NOTE — Assessment & Plan Note (Signed)
Has chronic back pain Takes Tylenol primarily for her pain and occasionally tramadol for more severe pain Continue

## 2019-05-06 NOTE — Assessment & Plan Note (Signed)
Son states she is continuing to have memory difficulties-there has been some decline I did refer her to neurology, but they were only doing telehealth at that time and she is not able to do that Discussed that I could refer to a different neurological group to see if they would do in person visits, but he was okay with holding off since he did not think it was necessary at this time She is still driving-only to the grocery store and pharmacy.  She has not gotten lost while driving

## 2019-05-06 NOTE — Assessment & Plan Note (Signed)
BP well controlled Current regimen effective and well tolerated Continue current medications at current doses CMP, CBC

## 2019-05-23 DIAGNOSIS — M67912 Unspecified disorder of synovium and tendon, left shoulder: Secondary | ICD-10-CM | POA: Diagnosis not present

## 2019-05-27 ENCOUNTER — Other Ambulatory Visit: Payer: Self-pay | Admitting: Internal Medicine

## 2019-05-27 NOTE — Telephone Encounter (Signed)
Last OV 05/06/19 Next OV 11/04/19 Last RF 04/29/19

## 2019-06-06 DIAGNOSIS — H353133 Nonexudative age-related macular degeneration, bilateral, advanced atrophic without subfoveal involvement: Secondary | ICD-10-CM | POA: Diagnosis not present

## 2019-06-06 DIAGNOSIS — H353212 Exudative age-related macular degeneration, right eye, with inactive choroidal neovascularization: Secondary | ICD-10-CM | POA: Diagnosis not present

## 2019-06-06 DIAGNOSIS — H18513 Endothelial corneal dystrophy, bilateral: Secondary | ICD-10-CM | POA: Diagnosis not present

## 2019-06-06 DIAGNOSIS — H353222 Exudative age-related macular degeneration, left eye, with inactive choroidal neovascularization: Secondary | ICD-10-CM | POA: Diagnosis not present

## 2019-06-17 ENCOUNTER — Inpatient Hospital Stay (HOSPITAL_COMMUNITY)
Admission: EM | Admit: 2019-06-17 | Discharge: 2019-06-21 | DRG: 312 | Disposition: A | Discharge status: Home Health | Payer: Medicare Other | Attending: Internal Medicine | Admitting: Internal Medicine

## 2019-06-17 ENCOUNTER — Encounter (HOSPITAL_COMMUNITY): Payer: Self-pay

## 2019-06-17 ENCOUNTER — Other Ambulatory Visit: Payer: Self-pay

## 2019-06-17 ENCOUNTER — Emergency Department (HOSPITAL_COMMUNITY): Payer: Medicare Other

## 2019-06-17 DIAGNOSIS — F0390 Unspecified dementia without behavioral disturbance: Secondary | ICD-10-CM | POA: Diagnosis present

## 2019-06-17 DIAGNOSIS — I071 Rheumatic tricuspid insufficiency: Secondary | ICD-10-CM | POA: Diagnosis present

## 2019-06-17 DIAGNOSIS — Z8249 Family history of ischemic heart disease and other diseases of the circulatory system: Secondary | ICD-10-CM | POA: Diagnosis not present

## 2019-06-17 DIAGNOSIS — S0003XA Contusion of scalp, initial encounter: Secondary | ICD-10-CM | POA: Diagnosis not present

## 2019-06-17 DIAGNOSIS — Z8551 Personal history of malignant neoplasm of bladder: Secondary | ICD-10-CM | POA: Diagnosis not present

## 2019-06-17 DIAGNOSIS — R55 Syncope and collapse: Principal | ICD-10-CM | POA: Diagnosis present

## 2019-06-17 DIAGNOSIS — I4891 Unspecified atrial fibrillation: Secondary | ICD-10-CM

## 2019-06-17 DIAGNOSIS — I10 Essential (primary) hypertension: Secondary | ICD-10-CM | POA: Diagnosis present

## 2019-06-17 DIAGNOSIS — Z7901 Long term (current) use of anticoagulants: Secondary | ICD-10-CM | POA: Diagnosis not present

## 2019-06-17 DIAGNOSIS — R8281 Pyuria: Secondary | ICD-10-CM | POA: Diagnosis present

## 2019-06-17 DIAGNOSIS — I340 Nonrheumatic mitral (valve) insufficiency: Secondary | ICD-10-CM | POA: Diagnosis not present

## 2019-06-17 DIAGNOSIS — Z66 Do not resuscitate: Secondary | ICD-10-CM | POA: Diagnosis not present

## 2019-06-17 DIAGNOSIS — R000 Tachycardia, unspecified: Secondary | ICD-10-CM | POA: Diagnosis present

## 2019-06-17 DIAGNOSIS — Z743 Need for continuous supervision: Secondary | ICD-10-CM | POA: Diagnosis not present

## 2019-06-17 DIAGNOSIS — E872 Acidosis, unspecified: Secondary | ICD-10-CM

## 2019-06-17 DIAGNOSIS — Z823 Family history of stroke: Secondary | ICD-10-CM

## 2019-06-17 DIAGNOSIS — R4020 Unspecified coma: Secondary | ICD-10-CM | POA: Diagnosis not present

## 2019-06-17 DIAGNOSIS — Z03818 Encounter for observation for suspected exposure to other biological agents ruled out: Secondary | ICD-10-CM | POA: Diagnosis not present

## 2019-06-17 DIAGNOSIS — I480 Paroxysmal atrial fibrillation: Secondary | ICD-10-CM | POA: Diagnosis present

## 2019-06-17 DIAGNOSIS — W1830XA Fall on same level, unspecified, initial encounter: Secondary | ICD-10-CM | POA: Diagnosis present

## 2019-06-17 DIAGNOSIS — R29818 Other symptoms and signs involving the nervous system: Secondary | ICD-10-CM | POA: Diagnosis not present

## 2019-06-17 DIAGNOSIS — W19XXXA Unspecified fall, initial encounter: Secondary | ICD-10-CM

## 2019-06-17 DIAGNOSIS — E785 Hyperlipidemia, unspecified: Secondary | ICD-10-CM | POA: Diagnosis present

## 2019-06-17 DIAGNOSIS — Z20828 Contact with and (suspected) exposure to other viral communicable diseases: Secondary | ICD-10-CM | POA: Diagnosis present

## 2019-06-17 DIAGNOSIS — Y92000 Kitchen of unspecified non-institutional (private) residence as  the place of occurrence of the external cause: Secondary | ICD-10-CM

## 2019-06-17 DIAGNOSIS — Z833 Family history of diabetes mellitus: Secondary | ICD-10-CM

## 2019-06-17 DIAGNOSIS — E871 Hypo-osmolality and hyponatremia: Secondary | ICD-10-CM | POA: Diagnosis not present

## 2019-06-17 DIAGNOSIS — I361 Nonrheumatic tricuspid (valve) insufficiency: Secondary | ICD-10-CM | POA: Diagnosis not present

## 2019-06-17 DIAGNOSIS — G4489 Other headache syndrome: Secondary | ICD-10-CM | POA: Diagnosis not present

## 2019-06-17 LAB — DIFFERENTIAL
Abs Immature Granulocytes: 0.05 10*3/uL (ref 0.00–0.07)
Basophils Absolute: 0 10*3/uL (ref 0.0–0.1)
Basophils Relative: 0 %
Eosinophils Absolute: 0.1 10*3/uL (ref 0.0–0.5)
Eosinophils Relative: 1 %
Immature Granulocytes: 1 %
Lymphocytes Relative: 14 %
Lymphs Abs: 1.1 10*3/uL (ref 0.7–4.0)
Monocytes Absolute: 0.7 10*3/uL (ref 0.1–1.0)
Monocytes Relative: 8 %
Neutro Abs: 5.9 10*3/uL (ref 1.7–7.7)
Neutrophils Relative %: 76 %

## 2019-06-17 LAB — URINALYSIS, ROUTINE W REFLEX MICROSCOPIC
Bilirubin Urine: NEGATIVE
Glucose, UA: NEGATIVE mg/dL
Hgb urine dipstick: NEGATIVE
Ketones, ur: 5 mg/dL — AB
Nitrite: NEGATIVE
Protein, ur: NEGATIVE mg/dL
Specific Gravity, Urine: 1.005 (ref 1.005–1.030)
pH: 8 (ref 5.0–8.0)

## 2019-06-17 LAB — COMPREHENSIVE METABOLIC PANEL
ALT: 22 U/L (ref 0–44)
AST: 19 U/L (ref 15–41)
Albumin: 3.4 g/dL — ABNORMAL LOW (ref 3.5–5.0)
Alkaline Phosphatase: 91 U/L (ref 38–126)
Anion gap: 8 (ref 5–15)
BUN: 20 mg/dL (ref 8–23)
CO2: 25 mmol/L (ref 22–32)
Calcium: 8.8 mg/dL — ABNORMAL LOW (ref 8.9–10.3)
Chloride: 103 mmol/L (ref 98–111)
Creatinine, Ser: 0.78 mg/dL (ref 0.44–1.00)
GFR calc Af Amer: >60 mL/min (ref >60)
GFR calc non Af Amer: >60 mL/min (ref >60)
Glucose, Bld: 124 mg/dL — ABNORMAL HIGH (ref 70–99)
Potassium: 4.2 mmol/L (ref 3.5–5.1)
Sodium: 136 mmol/L (ref 135–145)
Total Bilirubin: 0.8 mg/dL (ref 0.3–1.2)
Total Protein: 6.5 g/dL (ref 6.5–8.1)

## 2019-06-17 LAB — I-STAT CHEM 8, ED
BUN: 21 mg/dL (ref 8–23)
Calcium, Ion: 1.12 mmol/L — ABNORMAL LOW (ref 1.15–1.40)
Chloride: 100 mmol/L (ref 98–111)
Creatinine, Ser: 0.7 mg/dL (ref 0.44–1.00)
Glucose, Bld: 122 mg/dL — ABNORMAL HIGH (ref 70–99)
HCT: 41 % (ref 36.0–46.0)
Hemoglobin: 13.9 g/dL (ref 12.0–15.0)
Potassium: 4.1 mmol/L (ref 3.5–5.1)
Sodium: 136 mmol/L (ref 135–145)
TCO2: 27 mmol/L (ref 22–32)

## 2019-06-17 LAB — CBC
HCT: 40.6 % (ref 36.0–46.0)
Hemoglobin: 13.1 g/dL (ref 12.0–15.0)
MCH: 31 pg (ref 26.0–34.0)
MCHC: 32.3 g/dL (ref 30.0–36.0)
MCV: 96 fL (ref 80.0–100.0)
Platelets: 262 10*3/uL (ref 150–400)
RBC: 4.23 MIL/uL (ref 3.87–5.11)
RDW: 13.4 % (ref 11.5–15.5)
WBC: 7.8 10*3/uL (ref 4.0–10.5)
nRBC: 0 % (ref 0.0–0.2)

## 2019-06-17 LAB — SARS CORONAVIRUS 2 (TAT 6-24 HRS): SARS Coronavirus 2: NEGATIVE

## 2019-06-17 LAB — TROPONIN I (HIGH SENSITIVITY): Troponin I (High Sensitivity): 3 ng/L (ref <18)

## 2019-06-17 LAB — CBG MONITORING, ED: Glucose-Capillary: 120 mg/dL — ABNORMAL HIGH (ref 70–99)

## 2019-06-17 LAB — MAGNESIUM: Magnesium: 2.2 mg/dL (ref 1.7–2.4)

## 2019-06-17 LAB — PROTIME-INR
INR: 1 (ref 0.8–1.2)
Prothrombin Time: 13.3 seconds (ref 11.4–15.2)

## 2019-06-17 LAB — TSH: TSH: 1.497 u[IU]/mL (ref 0.350–4.500)

## 2019-06-17 LAB — PHOSPHORUS: Phosphorus: 3.7 mg/dL (ref 2.5–4.6)

## 2019-06-17 LAB — APTT: aPTT: 31 seconds (ref 24–36)

## 2019-06-17 MED ORDER — ONDANSETRON HCL 4 MG/2ML IJ SOLN: 4.0000 mg | Freq: Four times a day (QID) | INTRAMUSCULAR | Status: DC | PRN

## 2019-06-17 MED ORDER — METOPROLOL TARTRATE 25 MG PO TABS
25.0000 mg | ORAL_TABLET | Freq: Two times a day (BID) | ORAL | Status: DC
  Administered 2019-06-17 – 2019-06-19 (×4): 25 mg via ORAL
  Filled 2019-06-17 (×4): qty 1

## 2019-06-17 MED ORDER — SODIUM CHLORIDE 0.9 % IV BOLUS
1000.0000 mL | Freq: Once | INTRAVENOUS | Status: AC
  Administered 2019-06-17: 1000 mL via INTRAVENOUS

## 2019-06-17 MED ORDER — SODIUM CHLORIDE 0.9% FLUSH
3.0000 mL | Freq: Once | INTRAVENOUS | Status: AC
Start: 2019-06-17 — End: 2019-06-17
  Administered 2019-06-17: 3 mL via INTRAVENOUS

## 2019-06-17 MED ORDER — AMLODIPINE BESYLATE 5 MG PO TABS
5.0000 mg | ORAL_TABLET | Freq: Every day | ORAL | Status: DC
  Administered 2019-06-18 – 2019-06-19 (×2): 5 mg via ORAL
  Filled 2019-06-17 (×2): qty 1

## 2019-06-17 MED ORDER — LABETALOL HCL 5 MG/ML IV SOLN: 10.0000 mg | INTRAVENOUS | Status: DC | PRN

## 2019-06-17 MED ORDER — ONDANSETRON HCL 4 MG PO TABS: 4.0000 mg | ORAL_TABLET | Freq: Four times a day (QID) | ORAL | Status: DC | PRN

## 2019-06-17 MED ORDER — METOPROLOL TARTRATE 5 MG/5ML IV SOLN
2.5000 mg | Freq: Once | INTRAVENOUS | Status: AC
  Administered 2019-06-17: 2.5 mg via INTRAVENOUS
  Filled 2019-06-17: qty 5

## 2019-06-17 MED ORDER — ACETAMINOPHEN 650 MG RE SUPP: 650.0000 mg | Freq: Four times a day (QID) | RECTAL | Status: DC | PRN

## 2019-06-17 MED ORDER — APIXABAN 2.5 MG PO TABS
2.5000 mg | ORAL_TABLET | Freq: Two times a day (BID) | ORAL | Status: DC
  Administered 2019-06-17 – 2019-06-21 (×8): 2.5 mg via ORAL
  Filled 2019-06-17 (×9): qty 1

## 2019-06-17 MED ORDER — ACETAMINOPHEN 325 MG PO TABS
650.0000 mg | ORAL_TABLET | Freq: Four times a day (QID) | ORAL | Status: DC | PRN
  Administered 2019-06-17 – 2019-06-18 (×2): 650 mg via ORAL
  Filled 2019-06-17 (×2): qty 2

## 2019-06-17 NOTE — H&P (Addendum)
History and Physical    [name] [id number] DOB: [birth year] DOA: 06/17/2019  PCP: Binnie Rail, MD  Patient coming from: Home I have personally briefly reviewed patient's old medical records in [place]  Chief Complaint: Syncope episodes x2  HPI: [name] is a 85 y.o. female with medical history significant of hypertension, paroxysmal A. Fib-on Eliquis, arthritis presents to emergency department due to loss of consciousness and fall x2.  Patient's son who is at the bedside reports that patient was doing well this morning, was watching TV on the couch and suddenly she fell from the couch and her eyes rolled up, no seizure or  frothing from mouth or tongue biting reported.  This episode lasted for few seconds.  Patient back to her normal mentation within few seconds.  Son checked her blood pressure which was within normal limit, pulse was irregularly irregular.  The second episode happened when she was in the kitchen at 1230 and she fell suddenly on the ground and hit on her head-son found her unresponsive.  This episode also lasted for few seconds.  Son called EMS and brought patient to the emergency department for further evaluation and management.    No history of headache, blurry vision, dizziness, lightheadedness prior or after the episode.  Patient denies cough, congestion, fever, chills, chest pain, shortness of breath, palpitation, leg swelling, nausea, vomiting, diarrhea, decreased appetite, urinary or bowel changes.    Her son who is also a power of attorney lives with her, no history of smoking, alcohol, illicit drug use.    ED Course: Upon arrival to ED, patient's vital signs stable, PT/INR: WNL, initial labs such as CBC, CMP: WNL, COVID-19 pending, CT head and cervical spine came back negative for acute findings.  EDP consulted neurology who recommended syncope work-up.  Neurology signed off.  Review of Systems: As per HPI otherwise negative.    Past  Medical History:  Diagnosis Date  . Anemia   . Arthritis    "back" (01/16/2018)  . Atrial fibrillation ([place])   . Cancer [facility])    low grade papillary, most recent recurrence 2011,  Dr Karsten Ro  . 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 History of blood transfusion 01/15/2018   S/P OR  . Hypertension   . Hyponatremia 01/26/12   Na+ 131  . Macular degeneration, bilateral    "were wet; dry now" (01/16/2018)  . Other abnormal glucose 06/14/12   Fasting blood glucose 128; A1c 5.8% on 12/15/11    Past Surgical History:  Procedure Laterality Date  . ABDOMINAL HYSTERECTOMY  1970s   with oopherectomy for incidenctal cyst (no PMH of abnormal PAP)  . APPENDECTOMY     1970s  . BLADDER TUMOR EXCISION  2011 & 2013   Dr Karsten Ro  . CATARACT EXTRACTION, BILATERAL  2014   Dr Herbert Deaner  . COLONOSCOPY     negative; Dr Olevia Perches  . CYSTOSCOPY      multiple since tumor excision;Dr Ottelin  . DILATION AND CURETTAGE OF UTERUS    . FRACTURE SURGERY    . INTRAMEDULLARY (IM) NAIL INTERTROCHANTERIC Right 01/15/2018   Procedure: INTRAMEDULLARY (IM) NAIL INTERTROCHANTRIC;  Surgeon: Melrose Nakayama, MD;  Location: [place];  Service: Orthopedics;  Laterality: Right;  . KYPHOPLASTY N/A 03/08/2018   Procedure: THORACIC 12 KYPHOPLASTY;  Surgeon: Phylliss Bob, MD;  Location: [place];  Service: Orthopedics;  Laterality: N/A;  . TONSILLECTOMY       reports that she has never  smoked. She has never used smokeless tobacco. She reports that she does not drink alcohol or use drugs.  No Known Allergies  Family History  Problem Relation Age of Onset  . Stroke Mother 44  . Heart attack Father 69  . Coronary artery disease Brother        S/P CBAG ? @ 60  . Diabetes Brother   . Kidney disease Neg Hx   . Cancer Neg Hx   . Colon cancer Neg Hx   . Colon polyps Neg Hx     Prior to Admission medications   Medication Sig Start Date End Date Taking? Authorizing Provider  acetaminophen (TYLENOL) 500 MG tablet Take  500 mg by mouth every 6 (six) hours as needed for moderate pain.    [provider]  amLODipine (NORVASC) 5 MG tablet TAKE 1 TABLET BY MOUTH EVERY DAY FOR BLOOD PRESSURE 05/27/19   Binnie Rail, MD  calcium carbonate (OS-CAL) 600 MG TABS tablet Take 600 mg by mouth 2 (two) times daily.     [provider]  ELIQUIS 2.5 MG TABS tablet TAKE 1 TABLET BY MOUTH TWICE A DAY 04/05/19   Binnie Rail, MD  Ergocalciferol (VITAMIN D2) 2000 units TABS Take 2,000 Units by mouth daily.    [provider]  metoprolol tartrate (LOPRESSOR) 25 MG tablet TAKE 1 TABLET BY MOUTH TWICE A DAY 04/22/19   Burns, Claudina Lick, MD  multivitamin-lutein [facility]) CAPS capsule Take 1 capsule by mouth daily.    [provider]  Omega-3 Fatty Acids (FISH OIL) 1000 MG CAPS Take 100 mg by mouth 2 (two) times daily.     [provider]  traMADol (ULTRAM) 50 MG tablet TAKE 1 TABLET BY MOUTH EVERY 8 HOURS AS NEEDED FOR MODERATE PAIN. 05/27/19   Binnie Rail, MD  tretinoin (RETIN-A) 0.05 % cream Apply 1 application topically 2 (two) times a week.     [provider]    Physical Exam: Vitals:   06/17/19 1400 06/17/19 1403 06/17/19 1430 06/17/19 1600  BP: (!) 147/83 (!) 147/83 (!) 145/94 (!) 143/91  Pulse: 89 85 84 (!) 101  Resp: 18 19 17 15   Temp: 97.7 F (36.5 C) 97.7 F (36.5 C)    TempSrc: Oral Oral    SpO2: 99% 98% 99% 100%    Constitutional: NAD, calm, comfortable Eyes: PERRL, lids and conjunctivae normal ENMT: Mucous membranes are moist. Posterior pharynx clear of any exudate or lesions.Normal dentition.  Neck: normal, supple, no masses, no thyromegaly Respiratory: clear to auscultation bilaterally, no wheezing, no crackles. Normal respiratory effort. No accessory muscle use.  Cardiovascular: Regular rate and rhythm, no murmurs / rubs / gallops. No extremity edema. 2+ pedal pulses. No carotid bruits.  Abdomen: no tenderness, no masses palpated. No  hepatosplenomegaly. Bowel sounds positive.  Musculoskeletal: no clubbing / cyanosis. No joint deformity upper and lower extremities. Good ROM, no contractures. Normal muscle tone.  Skin: no rashes, lesions, ulcers. No induration Neurologic: CN 2-12 grossly intact. Sensation intact, DTR normal. Strength 5/5 in all 4.  Psychiatric: Normal judgment and insight. Alert and oriented x 3. Normal mood.    Labs on Admission: I have personally reviewed following labs and imaging studies  CBC: Recent Labs  Lab 06/17/19 1344 06/17/19 1346  WBC  --  7.8  NEUTROABS  --  5.9  HGB 13.9 13.1  HCT 41.0 40.6  MCV  --  96.0  PLT  --  99991111   Basic Metabolic  Panel: Recent Labs  Lab 06/17/19 1344 06/17/19 1346  NA 136 136  K 4.1 4.2  CL 100 103  CO2  --  25  GLUCOSE 122* 124*  BUN 21 20  CREATININE 0.70 0.78  CALCIUM  --  8.8*   GFR: CrCl cannot be calculated (Unknown ideal weight.). Liver Function Tests: Recent Labs  Lab 06/17/19 1346  AST 19  ALT 22  ALKPHOS 91  BILITOT 0.8  PROT 6.5  ALBUMIN 3.4*   No results for input(s): LIPASE, AMYLASE in the last 168 hours. No results for input(s): AMMONIA in the last 168 hours. Coagulation Profile: Recent Labs  Lab 06/17/19 1346  INR 1.0   Cardiac Enzymes: No results for input(s): CKTOTAL, CKMB, CKMBINDEX, TROPONINI in the last 168 hours. BNP (last 3 results) No results for input(s): PROBNP in the last 8760 hours. HbA1C: No results for input(s): HGBA1C in the last 72 hours. CBG: Recent Labs  Lab 06/17/19 1340  GLUCAP 120*   Lipid Profile: No results for input(s): CHOL, HDL, LDLCALC, TRIG, CHOLHDL, LDLDIRECT in the last 72 hours. Thyroid Function Tests: No results for input(s): TSH, T4TOTAL, FREET4, T3FREE, THYROIDAB in the last 72 hours. Anemia Panel: No results for input(s): VITAMINB12, FOLATE, FERRITIN, TIBC, IRON, RETICCTPCT in the last 72 hours. Urine analysis:    Component Value Date/Time   COLORURINE YELLOW  03/08/2018 0845   APPEARANCEUR CLOUDY (A) 03/08/2018 0845   LABSPEC 1.009 03/08/2018 0845   PHURINE 8.0 03/08/2018 0845   GLUCOSEU NEGATIVE 03/08/2018 0845   GLUCOSEU NEGATIVE 11/13/2017 1102   HGBUR NEGATIVE 03/08/2018 0845   HGBUR negative 09/10/2008 0752   BILIRUBINUR negative 12/21/2018 1101   KETONESUR NEGATIVE 03/08/2018 0845   PROTEINUR Negative 12/21/2018 1101   PROTEINUR NEGATIVE 03/08/2018 0845   UROBILINOGEN negative (A) 12/21/2018 1101   UROBILINOGEN 0.2 11/13/2017 1102   NITRITE negative 12/21/2018 1101   NITRITE NEGATIVE 03/08/2018 0845   LEUKOCYTESUR Large (3+) (A) 12/21/2018 1101    Radiological Exams on Admission: Ct Cervical Spine Wo Contrast  Result Date: 06/17/2019 CLINICAL DATA:  Syncope EXAM: CT CERVICAL SPINE WITHOUT CONTRAST TECHNIQUE: Multidetector CT imaging of the cervical spine was performed without intravenous contrast. Multiplanar CT image reconstructions were also generated. COMPARISON:  None. FINDINGS: Alignment: Reversal of the cervical lordosis centered at C4. Mild anterolisthesis at C3-C4 and retrolisthesis at C4-C5 and C5-C6. Skull base and vertebrae: No acute fracture. Vertebral body heights are maintained apart from degenerative endplate irregularity. Soft tissues and spinal canal: No prevertebral soft tissue swelling or fluid. No visible epidural collection or hematoma. Disc levels: There is multilevel disc space narrowing, endplate osteophytes and irregularity, and facet and uncovertebral hypertrophy. There is no high-grade canal stenosis at any level. Foraminal stenosis is greatest on the left at C3-C4 and on the right at C5-C6. Upper chest: Scarring at the lung apices. Other: None. IMPRESSION: No acute cervical spine fracture. Electronically Signed   By: Macy Mis M.D.   On: 06/17/2019 14:16   Ct Head Code Stroke Wo Contrast  Result Date: 06/17/2019 CLINICAL DATA:  Code stroke. EXAM: CT HEAD WITHOUT CONTRAST TECHNIQUE: Contiguous axial images  were obtained from the base of the skull through the vertex without intravenous contrast. COMPARISON:  None. FINDINGS: Brain: There is no acute intracranial hemorrhage, mass effect, or edema. Gray-white differentiation is preserved. There is no extra-axial fluid collection. Ventricles and sulci are within normal limits in size and configuration. Patchy hypoattenuation in the supratentorial white matter is nonspecific but probably reflects mild  to moderate chronic microvascular ischemic changes. A small focal area of low attenuation within the right basal ganglia likely reflects an age-indeterminate small vessel infarct. Vascular: No hyperdense vessel. There is intracranial atherosclerotic calcification at the skull base. Skull: Unremarkable Sinuses/Orbits: Minor paranasal sinus mucosal thickening. No acute orbital abnormality. Other: Mastoid air cells are clear. ASPECTS [facility] Stroke Program Early CT Score) - Ganglionic level infarction (caudate, lentiform nuclei, internal capsule, insula, M1-M3 cortex): 7 - Supraganglionic infarction (M4-M6 cortex): 3 Total score (0-10 with 10 being normal): 10 IMPRESSION: 1. No acute intracranial hemorrhage or mass effect. Age indeterminate small vessel infarction of the right lentiform nucleus. 2. ASPECTS is 10. 3. Chronic microvascular ischemic changes. Electronically Signed   By: Macy Mis M.D.   On: 06/17/2019 14:06    EKG: A. fib with prolonged QT interval, no ST elevation or depression noted.  Assessment/Plan Principal Problem:   Syncope Active Problems:   Essential hypertension   Paroxysmal atrial fibrillation (HCC)    Syncope: -Could be secondary to orthostatic hypotension? -Initial labs such as CBC, CMP, PT/INR: WNL, COVID-19 pending.  Vital signs stable.  Blood glucose: WNL. -CT head and cervical spine came back negative for acute findings. -Admit patient under observation.  On telemetry.  Monitor vitals closely. -Check orthostatic vitals, TSH,  transthoracic echo, troponin and UA -EDP consulted neurology-recommended syncope work-up, neurology signed off. -Consulted physical therapy.  Paroxysmal A. fib: Rate controlled -We will continue Eliquis and metoprolol-On telemetry.  Monitor heart rate closely.  Hypertension: Stable -Continue amlodipine and metoprolol. -Monitor blood pressure closely.  History of T12 compression fracture: Status post kyphoplasty -By Dr. Lynann Bologna in 03/08/1918 -Continue Tylenol as needed for pain.  DVT prophylaxis: TED/SCD/Eliquis Code Status: DNR-confirmed with the patient Family Communication: Patient's son present at bedside.  Plan of care discussed with patient and her son in length and they verbalized understanding and agreed with it. Disposition Plan: Likely home tomorrow Consults called: Neurology by EDP Admission status: Observation   Mckinley Jewel MD [facility] Pager [phone]  If 7PM-7AM, please contact night-coverage [email or web address] Password [email or web address]  06/17/2019, 5:22 PM

## 2019-06-17 NOTE — ED Notes (Signed)
Pt used bedside commode.  Her hr increased to 140.  EKG performed and shown to EDP.  Will attempt to place a pure wick.

## 2019-06-17 NOTE — ED Provider Notes (Signed)
85 yo F with a chief complaint of syncopal event.  I received the patient in signout from Dr. Sedonia Small, briefly the patient had had an episode where she lost consciousness was called a code stroke in the field by EMS.  Evaluated by the neurologist and by history and deemed to most likely be syncopal event.  Unfortunately nobody had ever discussed the case with the son and there is still waiting to hear back from him.  Plan is to reassess her after bolus of IV fluids.  The son is arrived to the ED, he is physicians assistant.  States that his mother had 2 separate episodes of loss of consciousness today both were without prodrome.  First episode she was sitting on the couch and then fell over to her side was out for about 3 to 4 seconds and then regained consciousness.  Noted to be in a irregular tachycardia.  She came back to her baseline and was able to eat lunch and he thought that she was doing better she got up to walk into the kitchen and again collapsed to the ground.  At that time he decided to send her to the hospital.  At no point did she have chest pain or trouble breathing.  She states she remembers walking in the kitchen and the next woke up on the ground.  She has now had 2 syncopal events within about 4 hours without prodrome.  This is concerning for a cardiac arrhythmia.  Could be due to her atrial fibrillation, per the son she is not typically in A. fib.  She is essentially rate controlled without medications currently.  She is taking Eliquis and has been compliant with that per the family.  Will discuss with hospitalist.   Deno Etienne, DO 06/17/19 1806

## 2019-06-17 NOTE — ED Triage Notes (Signed)
Pt arrives with ems as code stroke, LSN 1230 today, pt was at home in the kitchen when her son her a thump and found the pt unresponsive in the kitchen. Son reports to EMS that the pt was unresponsive for about 45 seconds. Pt took Eliquis at 830 this morning. Pt arrives to ED alert, oriented, c collar in place, c.o mild headache. Pt taken to CT

## 2019-06-17 NOTE — ED Notes (Signed)
CHUCK (SON): [phone]

## 2019-06-17 NOTE — Consult Note (Signed)
Stroke Neurology Consultation Note  Consult Requested by: Dr. Sedonia Small  Reason for Consult: syncope  Consult Date: 06/17/19  The history was obtained from the pt and EMS.  During history and examination, all items were able to obtain unless otherwise noted.  History of Present Illness:  [name] is a 85 y.o. Caucasian female with PMH of PAF on eliquis, arthritis, bladder cancer presented to ER for episode of LOC and fall.  Pt was in the kitchen at 12:30 pm, son heart a thump sound and came out to see pt found her on the floor unresponsive. As per pt, she said she was reaching for the fridge and then can not remember anything. She denies any dizziness, HA, CP or vision changes before the fall. EMS called by son, on EMS arrival, pt has gained conspicuousness, no focal neuro deficit found. Code stroke was called anyway.   As per pt, she takes eliquis everyday and last dose was this 8am. Currently she has left vertex pain due to left vertex scalp hematoma.   LSN: 28 tPA Given: No: NIHSS=0, nonstroke  Past Medical History:  Diagnosis Date  . Anemia   . Arthritis    "back" (01/16/2018)  . Atrial fibrillation ([place])   . Cancer [facility])    low grade papillary, most recent recurrence 2011,  Dr Karsten Ro  . 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 History of blood transfusion 01/15/2018   S/P OR  . Hypertension   . Hyponatremia 01/26/12   Na+ 131  . Macular degeneration, bilateral    "were wet; dry now" (01/16/2018)  . Other abnormal glucose 06/14/12   Fasting blood glucose 128; A1c 5.8% on 12/15/11    Past Surgical History:  Procedure Laterality Date  . ABDOMINAL HYSTERECTOMY  1970s   with oopherectomy for incidenctal cyst (no PMH of abnormal PAP)  . APPENDECTOMY     1970s  . BLADDER TUMOR EXCISION  2011 & 2013   Dr Karsten Ro  . CATARACT EXTRACTION, BILATERAL  2014   Dr Herbert Deaner  . COLONOSCOPY     negative; Dr Olevia Perches  . CYSTOSCOPY      multiple since tumor excision;Dr  Ottelin  . DILATION AND CURETTAGE OF UTERUS    . FRACTURE SURGERY    . INTRAMEDULLARY (IM) NAIL INTERTROCHANTERIC Right 01/15/2018   Procedure: INTRAMEDULLARY (IM) NAIL INTERTROCHANTRIC;  Surgeon: Melrose Nakayama, MD;  Location: [place];  Service: Orthopedics;  Laterality: Right;  . KYPHOPLASTY N/A 03/08/2018   Procedure: THORACIC 12 KYPHOPLASTY;  Surgeon: Phylliss Bob, MD;  Location: [place];  Service: Orthopedics;  Laterality: N/A;  . TONSILLECTOMY      Family History  Problem Relation Age of Onset  . Stroke Mother 25  . Heart attack Father 52  . Coronary artery disease Brother        S/P CBAG ? @ 60  . Diabetes Brother   . Kidney disease Neg Hx   . Cancer Neg Hx   . Colon cancer Neg Hx   . Colon polyps Neg Hx     Social History:  reports that she has never smoked. She has never used smokeless tobacco. She reports that she does not drink alcohol or use drugs.  Allergies: No Known Allergies  No current facility-administered medications on file prior to encounter.    Current Outpatient Medications on File Prior to Encounter  Medication Sig Dispense Refill  . acetaminophen (TYLENOL) 500 MG tablet Take 500 mg by mouth every 6 (six) hours  as needed for moderate pain.    Marland Kitchen amLODipine (NORVASC) 5 MG tablet TAKE 1 TABLET BY MOUTH EVERY DAY FOR BLOOD PRESSURE 90 tablet 1  . calcium carbonate (OS-CAL) 600 MG TABS tablet Take 600 mg by mouth 2 (two) times daily.     Marland Kitchen ELIQUIS 2.5 MG TABS tablet TAKE 1 TABLET BY MOUTH TWICE A DAY 60 tablet 5  . Ergocalciferol (VITAMIN D2) 2000 units TABS Take 2,000 Units by mouth daily.    . metoprolol tartrate (LOPRESSOR) 25 MG tablet TAKE 1 TABLET BY MOUTH TWICE A DAY 180 tablet 1  . multivitamin-lutein (OCUVITE-LUTEIN) CAPS capsule Take 1 capsule by mouth daily.    . Omega-3 Fatty Acids (FISH OIL) 1000 MG CAPS Take 100 mg by mouth 2 (two) times daily.     . traMADol (ULTRAM) 50 MG tablet TAKE 1 TABLET BY MOUTH EVERY 8 HOURS AS NEEDED FOR MODERATE PAIN. 60  tablet 0  . tretinoin (RETIN-A) 0.05 % cream Apply 1 application topically 2 (two) times a week.       Review of Systems: A full ROS was attempted today and was able to be performed.  Systems assessed include - Constitutional, Eyes, HENT, Respiratory, Cardiovascular, Gastrointestinal, Genitourinary, Integument/breast, Hematologic/lymphatic, Musculoskeletal, Neurological, Behavioral/Psych, Endocrine, Allergic/Immunologic - with pertinent responses as per HPI.  Physical Examination: Temp:  [97.7 F (36.5 C)] 97.7 F (36.5 C) (11/30 1403) Pulse Rate:  [84-85] 85 (11/30 1403) Resp:  [18-19] 19 (11/30 1403) BP: (147)/(83) 147/83 (11/30 1403) SpO2:  [98 %-100 %] 98 % (11/30 1403)  General - well nourished, well developed, in no apparent distress. Left vertex scalp hematoma, soft, tenderness to touch  Ophthalmologic - fundi not visualized due to noncooperation.    Cardiovascular - regular rhythm and rate  Mental Status -  Level of arousal and orientation to time, place, and person were intact. Language including expression, naming, repetition, comprehension was assessed and found intact.  Cranial Nerves II - XII - II - Vision intact OU. III, IV, VI - Extraocular movements intact. V - Facial sensation intact bilaterally. VII - Facial movement intact bilaterally. VIII - Hearing & vestibular intact bilaterally. X - Palate elevates symmetrically. XI - Chin turning & shoulder shrug intact bilaterally. XII - Tongue protrusion intact.  Motor Strength - The patient's strength was normal in all extremities and pronator drift was absent.   Motor Tone & Bulk - Muscle tone was assessed at the neck and appendages and was normal.  Bulk was normal and fasciculations were absent.   Reflexes - The patient's reflexes were normal in all extremities and she had no pathological reflexes.  Sensory - Light touch, temperature/pinprick were assessed and were normal.    Coordination - The patient had  normal movements in the hands with no ataxia or dysmetria.  Tremor was absent.  Gait and Station - deferred  Data Reviewed: Ct Head Code Stroke Wo Contrast  Result Date: 06/17/2019 CLINICAL DATA:  Code stroke. EXAM: CT HEAD WITHOUT CONTRAST TECHNIQUE: Contiguous axial images were obtained from the base of the skull through the vertex without intravenous contrast. COMPARISON:  None. FINDINGS: Brain: There is no acute intracranial hemorrhage, mass effect, or edema. Gray-white differentiation is preserved. There is no extra-axial fluid collection. Ventricles and sulci are within normal limits in size and configuration. Patchy hypoattenuation in the supratentorial white matter is nonspecific but probably reflects mild to moderate chronic microvascular ischemic changes. A small focal area of low attenuation within the right basal ganglia likely reflects  an age-indeterminate small vessel infarct. Vascular: No hyperdense vessel. There is intracranial atherosclerotic calcification at the skull base. Skull: Unremarkable Sinuses/Orbits: Minor paranasal sinus mucosal thickening. No acute orbital abnormality. Other: Mastoid air cells are clear. ASPECTS [facility] Stroke Program Early CT Score) - Ganglionic level infarction (caudate, lentiform nuclei, internal capsule, insula, M1-M3 cortex): 7 - Supraganglionic infarction (M4-M6 cortex): 3 Total score (0-10 with 10 being normal): 10 IMPRESSION: 1. No acute intracranial hemorrhage or mass effect. Age indeterminate small vessel infarction of the right lentiform nucleus. 2. ASPECTS is 10. 3. Chronic microvascular ischemic changes. Electronically Signed   By: Macy Mis M.D.   On: 06/17/2019 14:06    Assessment: 85 y.o. female PMH of PAF on eliquis, arthritis, bladder cancer presented to ER for episode of LOC and fall. Description consistent with syncope. No focal deficit on exam. NIHSS = 0. CT no acute finding except left vertex scalp hematoma. She has left vertex pain  due to left vertex scalp hematoma. Recommend to continue syncope work up, such as orthostatic vitals and EKG and TTE.   Plan: - recommend syncope work up such as orthostatic vitals and EKG and TTE.  - CT C spine result pending, consider clear C collar as per EDP - OK to continue eliquis from neuro standpoint if scalp hematoma stable. - neurology will sign off for now and please call with questions.  Thank you for this consultation and allowing [place] to participate in the care of this patient.  Rosalin Hawking, MD PhD Stroke Neurology 06/17/2019 2:26 PM

## 2019-06-17 NOTE — ED Notes (Signed)
PT became nauseated while attempting to get up from toilet.  Also stated L occipital headache.  MD notified.

## 2019-06-17 NOTE — ED Provider Notes (Signed)
[facility] Emergency Department Provider Note MRN:  [id number]  Arrival date & time: 06/17/19     Chief Complaint   Code Stroke   History of Present Illness   [name] is a 85 y.o. year-old female with a history of A. fib on Eliquis presenting to the ED with chief complaint of code stroke.  Patient does not recall what happened.  She remembers walking to the fridge and then she remembers waking up.  Code stroke initiated prior to arrival.  Patient endorsing pain to the back of her head, mild neck stiffness, otherwise no complaints.  No chest pain, no shortness of breath, no abdominal pain, no recent fever or cough.  Review of Systems  A complete 10 system review of systems was obtained and all systems are negative except as noted in the HPI and PMH.   Patient's Health History    Past Medical History:  Diagnosis Date  . Anemia   . Arthritis    "back" (01/16/2018)  . Atrial fibrillation ([place])   . Cancer [facility])    low grade papillary, most recent recurrence 2011,  Dr Karsten Ro  . 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 History of blood transfusion 01/15/2018   S/P OR  . Hypertension   . Hyponatremia 01/26/12   Na+ 131  . Macular degeneration, bilateral    "were wet; dry now" (01/16/2018)  . Other abnormal glucose 06/14/12   Fasting blood glucose 128; A1c 5.8% on 12/15/11    Past Surgical History:  Procedure Laterality Date  . ABDOMINAL HYSTERECTOMY  1970s   with oopherectomy for incidenctal cyst (no PMH of abnormal PAP)  . APPENDECTOMY     1970s  . BLADDER TUMOR EXCISION  2011 & 2013   Dr Karsten Ro  . CATARACT EXTRACTION, BILATERAL  2014   Dr Herbert Deaner  . COLONOSCOPY     negative; Dr Olevia Perches  . CYSTOSCOPY      multiple since tumor excision;Dr Ottelin  . DILATION AND CURETTAGE OF UTERUS    . FRACTURE SURGERY    . INTRAMEDULLARY (IM) NAIL INTERTROCHANTERIC Right 01/15/2018   Procedure: INTRAMEDULLARY (IM) NAIL INTERTROCHANTRIC;   Surgeon: Melrose Nakayama, MD;  Location: [place];  Service: Orthopedics;  Laterality: Right;  . KYPHOPLASTY N/A 03/08/2018   Procedure: THORACIC 12 KYPHOPLASTY;  Surgeon: Phylliss Bob, MD;  Location: [place];  Service: Orthopedics;  Laterality: N/A;  . TONSILLECTOMY      Family History  Problem Relation Age of Onset  . Stroke Mother 49  . Heart attack Father 64  . Coronary artery disease Brother        S/P CBAG ? @ 60  . Diabetes Brother   . Kidney disease Neg Hx   . Cancer Neg Hx   . Colon cancer Neg Hx   . Colon polyps Neg Hx     Social History   Socioeconomic History  . Marital status: Widowed    Spouse name: Not on file  . Number of children: 2  . Years of education: Not on file  . Highest education level: Not on file  Occupational History  . Occupation: Retired  Scientific laboratory technician  . Financial resource strain: Not hard at all  . Food insecurity    Worry: Never true    Inability: Never true  . Transportation needs    Medical: No    Non-medical: No  Tobacco Use  . Smoking status: Never Smoker  . Smokeless tobacco: Never Used  Substance and Sexual Activity  . Alcohol use: Never    Alcohol/week: 0.0 standard drinks    Frequency: Never  . Drug use: Never  . Sexual activity: Never  Lifestyle  . Physical activity    Days per week: 3 days    Minutes per session: 30 min  . Stress: Not at all  Relationships  . Social connections    Talks on phone: More than three times a week    Gets together: More than three times a week    Attends religious service: More than 4 times per year    Active member of club or organization: Yes    Attends meetings of clubs or organizations: More than 4 times per year    Relationship status: Widowed  . Intimate partner violence    Fear of current or ex partner: Not on file    Emotionally abused: Not on file    Physically abused: Not on file    Forced sexual activity: Not on file  Other Topics Concern  . Not on file  Social History  Narrative  . Not on file     Physical Exam  Vital Signs and Nursing Notes reviewed Vitals:   06/17/19 1400 06/17/19 1403  BP: (!) 147/83 (!) 147/83  Pulse: 84 85  Resp: 18 19  Temp: 97.7 F (36.5 C) 97.7 F (36.5 C)  SpO2: 100% 98%    CONSTITUTIONAL: Well-appearing, NAD NEURO:  Alert and oriented x 3, no focal deficits EYES:  eyes equal and reactive ENT/NECK:  no LAD, no JVD CARDIO: Regular rate, well-perfused, normal S1 and S2 PULM:  CTAB no wheezing or rhonchi GI/GU:  normal bowel sounds, non-distended, non-tender MSK/SPINE:  No gross deformities, no edema SKIN:  no rash, atraumatic PSYCH:  Appropriate speech and behavior  Diagnostic and Interventional Summary    EKG Interpretation  Date/Time:  Monday June 17 2019 14:05:12 EST Ventricular Rate:  87 PR Interval:    QRS Duration: 94 QT Interval:  436 QTC Calculation: 525 R Axis:   -67 Text Interpretation: Atrial fibrillation Left anterior fascicular block Abnormal R-wave progression, early transition Probable LVH with secondary repol abnrm Anterior Q waves, possibly due to LVH Prolonged QT interval No significant change was found Confirmed by Gerlene Fee [phone]) on 06/17/2019 3:57:04 PM      Labs Reviewed  COMPREHENSIVE METABOLIC PANEL - Abnormal; Notable for the following components:      Result Value   Glucose, Bld 124 (*)    Calcium 8.8 (*)    Albumin 3.4 (*)    All other components within normal limits  CBG MONITORING, ED - Abnormal; Notable for the following components:   Glucose-Capillary 120 (*)    All other components within normal limits  I-STAT CHEM 8, ED - Abnormal; Notable for the following components:   Glucose, Bld 122 (*)    Calcium, Ion 1.12 (*)    All other components within normal limits  PROTIME-INR  APTT  CBC  DIFFERENTIAL  CBG MONITORING, ED    CT HEAD CODE STROKE WO CONTRAST  Final Result    CT CERVICAL SPINE WO CONTRAST  Final Result      Medications  sodium chloride  flush (NS) 0.9 % injection 3 mL (has no administration in time range)  sodium chloride 0.9 % bolus 1,000 mL (has no administration in time range)     Procedures  /  Critical Care Procedures  ED Course and Medical Decision Making  I have reviewed the  triage vital signs and the nursing notes.  Pertinent labs & imaging results that were available during my care of the patient were reviewed by me and considered in my medical decision making (see below for details).     Suspect benign syncopal episode in this 85 year old female with normal vital signs, normal neurological exam.  CT imaging is reassuring, labs reassuring.  Patient able to ambulate but became lightheaded with nausea.  She admits to not drinking much water recently.  Will provide liter bolus.  Also will continue to attempt contact with patient's son, who may be able to provide more information on why patient was brought to the emergency department and by EMS was concern for stroke.  Signed out to oncoming provider at shift change.  Anticipating discharge.    Barth Kirks. Sedonia Small, MD [place] [email or web address]  Final Clinical Impressions(s) / ED Diagnoses     ICD-10-CM   1. Fall, initial encounter  W19.XXXA   2. Syncope, unspecified syncope type  R55     ED Discharge Orders    None       Discharge Instructions Discussed with and Provided to Patient:   Discharge Instructions   None       Maudie Flakes, MD 06/17/19 1558

## 2019-06-17 NOTE — Code Documentation (Signed)
85yo female arriving to [facility] via [place] at 1338. Patient from home where she was LKW at 1230. Patient is on Eliquis for atrial fibrillation with last dose this morning. Patient's son was at the house and heard a thump at 1230 and found her on the floor. Patient did not respond for about 30 seconds. EMS called and activated a code stroke. Patient with reported hematoma to back of head. Stroke team at the bedside on patient arrival. Labs drawn and patient to CT with team. NIHSS 0, see documentation for details and code stroke times.  CT completed. Patient reports some dizziness when sitting up. Patient is not a tPA candidate due to Eliquis dose this AM. Code stroke canceled. Bedside handoff with ED RN Lovena Le.

## 2019-06-18 ENCOUNTER — Observation Stay (HOSPITAL_BASED_OUTPATIENT_CLINIC_OR_DEPARTMENT_OTHER): Payer: Medicare Other

## 2019-06-18 ENCOUNTER — Other Ambulatory Visit: Payer: Self-pay | Admitting: Internal Medicine

## 2019-06-18 ENCOUNTER — Observation Stay (HOSPITAL_COMMUNITY): Payer: Medicare Other

## 2019-06-18 DIAGNOSIS — R55 Syncope and collapse: Secondary | ICD-10-CM | POA: Diagnosis not present

## 2019-06-18 DIAGNOSIS — I361 Nonrheumatic tricuspid (valve) insufficiency: Secondary | ICD-10-CM | POA: Diagnosis not present

## 2019-06-18 DIAGNOSIS — I10 Essential (primary) hypertension: Secondary | ICD-10-CM | POA: Diagnosis not present

## 2019-06-18 DIAGNOSIS — I340 Nonrheumatic mitral (valve) insufficiency: Secondary | ICD-10-CM | POA: Diagnosis not present

## 2019-06-18 DIAGNOSIS — I480 Paroxysmal atrial fibrillation: Secondary | ICD-10-CM | POA: Diagnosis not present

## 2019-06-18 LAB — CBC
HCT: 38.5 % (ref 36.0–46.0)
Hemoglobin: 12.5 g/dL (ref 12.0–15.0)
MCH: 30.8 pg (ref 26.0–34.0)
MCHC: 32.5 g/dL (ref 30.0–36.0)
MCV: 94.8 fL (ref 80.0–100.0)
Platelets: 247 10*3/uL (ref 150–400)
RBC: 4.06 MIL/uL (ref 3.87–5.11)
RDW: 13.4 % (ref 11.5–15.5)
WBC: 8.7 10*3/uL (ref 4.0–10.5)
nRBC: 0 % (ref 0.0–0.2)

## 2019-06-18 LAB — COMPREHENSIVE METABOLIC PANEL
ALT: 20 U/L (ref 0–44)
AST: 18 U/L (ref 15–41)
Albumin: 3.3 g/dL — ABNORMAL LOW (ref 3.5–5.0)
Alkaline Phosphatase: 84 U/L (ref 38–126)
Anion gap: 12 (ref 5–15)
BUN: 13 mg/dL (ref 8–23)
CO2: 23 mmol/L (ref 22–32)
Calcium: 8.5 mg/dL — ABNORMAL LOW (ref 8.9–10.3)
Chloride: 102 mmol/L (ref 98–111)
Creatinine, Ser: 0.75 mg/dL (ref 0.44–1.00)
GFR calc Af Amer: >60 mL/min (ref >60)
GFR calc non Af Amer: >60 mL/min (ref >60)
Glucose, Bld: 131 mg/dL — ABNORMAL HIGH (ref 70–99)
Potassium: 3.4 mmol/L — ABNORMAL LOW (ref 3.5–5.1)
Sodium: 137 mmol/L (ref 135–145)
Total Bilirubin: 1.1 mg/dL (ref 0.3–1.2)
Total Protein: 6.3 g/dL — ABNORMAL LOW (ref 6.5–8.1)

## 2019-06-18 LAB — TROPONIN I (HIGH SENSITIVITY): Troponin I (High Sensitivity): 3 ng/L (ref <18)

## 2019-06-18 LAB — ECHOCARDIOGRAM COMPLETE

## 2019-06-18 MED ORDER — POTASSIUM CHLORIDE CRYS ER 20 MEQ PO TBCR
40.0000 meq | EXTENDED_RELEASE_TABLET | Freq: Once | ORAL | Status: AC
  Administered 2019-06-18: 40 meq via ORAL
  Filled 2019-06-18: qty 2

## 2019-06-18 MED ORDER — HALOPERIDOL LACTATE 5 MG/ML IJ SOLN
2.0000 mg | Freq: Once | INTRAMUSCULAR | Status: AC
  Administered 2019-06-18: 2 mg via INTRAVENOUS
  Filled 2019-06-18: qty 1

## 2019-06-18 NOTE — Progress Notes (Signed)
Carotid artery duplex completed. Refer to "CV Proc" under chart review to view preliminary results.  06/18/2019 2:36 PM Kelby Aline., MHA, RVT, RDCS, RDMS

## 2019-06-18 NOTE — Plan of Care (Signed)

## 2019-06-18 NOTE — Progress Notes (Signed)
EEG complete - results pending 

## 2019-06-18 NOTE — Progress Notes (Signed)
PROGRESS NOTE  [name] HWE:[id number] DOB: September 19, 1929 DOA: 06/17/2019 PCP: Binnie Rail, MD  HPI/Recap of past 24 hours: HPI from Dr Early Osmond [name] is a 85 y.o. female with medical history significant of hypertension, paroxysmal A. Fib-on Eliquis, arthritis presents to emergency department due to loss of consciousness (for few seconds) and fall x2. No prior chest pain/SOB/headache. No seizures noted. In the ED, patient's vital signs stable, PT/INR: WNL, initial labs such as CBC, CMP: WNL, COVID-19 negative, CT head and cervical spine came back negative for acute findings.  EDP consulted neurology who recommended syncope work-up.  Neurology signed off.    Today, met patient sitting up in bed, reported she just returned from church, not completely oriented to where she is, but to self.  Son at bedside, reports mom has been also forgetful, with possible dementia diagnosis.  Patient looked comfortable denied any chest pain, shortness of breath, abdominal pain, nausea/vomiting, fever/chills.   Assessment/Plan: Principal Problem:   Syncope Active Problems:   Essential hypertension   Paroxysmal atrial fibrillation (HCC)  Syncope Unknown etiology ??Afib with RVR Vs orthostatic hypotension Vs Vasovagal Due to to known falls with witnessed LOC, will order EEG, carotid Doppler Orthostatic vitals unremarkable TSH unremarkable UA unremarkable Chest x-ray pending Carotid Doppler pending EEG pending CT head and cervical spine came back negative for acute findings Echo showed EF of 50 to 55% EDP consulted neurology-recommended syncope work-up, neurology signed off Consulted physical therapy.  Paroxysmal A. Fib Rate somewhat uncontrolled, on ambulation heart rate in the 110s Repeat EKG pending Continue metoprolol, Eliquis  Hypertension Stable Continue amlodipine and metoprolol  History of T12 compression fracture Status post kyphoplasty Continue Tylenol as  needed for pain.        Malnutrition Type:      Malnutrition Characteristics:      Nutrition Interventions:       Estimated body mass index is 17.51 kg/m as calculated from the following:   Height as of 05/06/19: 5' 4"  (1.626 m).   Weight as of 05/06/19: 46.3 kg.     Code Status: DNR   Family Communication: Son at bedside  Disposition Plan: Plan to d/c home after syncope workup   Consultants:  Neurology  Procedures:  None  Antimicrobials:  None  DVT prophylaxis: Eliquis   Objective: Vitals:   06/18/19 0730 06/18/19 0921 06/18/19 1100 06/18/19 1250  BP: 115/67 128/83  124/78  Pulse: (!) 106 (!) 134  (!) 109  Resp: (!) 22 20 19 16   Temp:  98.1 F (36.7 C)  98.2 F (36.8 C)  TempSrc:  Oral  Oral  SpO2: 97% 98%  99%    Intake/Output Summary (Last 24 hours) at 06/18/2019 1328 Last data filed at 06/17/2019 1849 Gross per 24 hour  Intake 1000 ml  Output -  Net 1000 ml   There were no vitals filed for this visit.  Exam:  General: NAD   Cardiovascular: S1, S2 present  Respiratory: CTAB  Abdomen: Soft, nontender, nondistended, bowel sounds present  Musculoskeletal: No bilateral pedal edema noted  Skin: Normal  Psychiatry: Normal mood  Neurology: Strength 5/5 in all extremities  Data Reviewed: CBC: Recent Labs  Lab 06/17/19 1344 06/17/19 1346 06/18/19 0323  WBC  --  7.8 8.7  NEUTROABS  --  5.9  --   HGB 13.9 13.1 12.5  HCT 41.0 40.6 38.5  MCV  --  96.0 94.8  PLT  --  262 893   Basic Metabolic  Panel: Recent Labs  Lab 06/17/19 1344 06/17/19 1346 06/17/19 1351 06/18/19 0323  NA 136 136  --  137  K 4.1 4.2  --  3.4*  CL 100 103  --  102  CO2  --  25  --  23  GLUCOSE 122* 124*  --  131*  BUN 21 20  --  13  CREATININE 0.70 0.78  --  0.75  CALCIUM  --  8.8*  --  8.5*  MG  --   --  2.2  --   PHOS  --   --  3.7  --    GFR: CrCl cannot be calculated (Unknown ideal weight.). Liver Function Tests: Recent Labs  Lab  06/17/19 1346 06/18/19 0323  AST 19 18  ALT 22 20  ALKPHOS 91 84  BILITOT 0.8 1.1  PROT 6.5 6.3*  ALBUMIN 3.4* 3.3*   No results for input(s): LIPASE, AMYLASE in the last 168 hours. No results for input(s): AMMONIA in the last 168 hours. Coagulation Profile: Recent Labs  Lab 06/17/19 1346  INR 1.0   Cardiac Enzymes: No results for input(s): CKTOTAL, CKMB, CKMBINDEX, TROPONINI in the last 168 hours. BNP (last 3 results) No results for input(s): PROBNP in the last 8760 hours. HbA1C: No results for input(s): HGBA1C in the last 72 hours. CBG: Recent Labs  Lab 06/17/19 1340  GLUCAP 120*   Lipid Profile: No results for input(s): CHOL, HDL, LDLCALC, TRIG, CHOLHDL, LDLDIRECT in the last 72 hours. Thyroid Function Tests: Recent Labs    06/17/19 2000  TSH 1.497   Anemia Panel: No results for input(s): VITAMINB12, FOLATE, FERRITIN, TIBC, IRON, RETICCTPCT in the last 72 hours. Urine analysis:    Component Value Date/Time   COLORURINE STRAW (A) 06/17/2019 1845   APPEARANCEUR CLEAR 06/17/2019 1845   LABSPEC 1.005 06/17/2019 1845   PHURINE 8.0 06/17/2019 1845   GLUCOSEU NEGATIVE 06/17/2019 1845   GLUCOSEU NEGATIVE 11/13/2017 1102   HGBUR NEGATIVE 06/17/2019 1845   HGBUR negative 09/10/2008 0752   BILIRUBINUR NEGATIVE 06/17/2019 1845   BILIRUBINUR negative 12/21/2018 1101   KETONESUR 5 (A) 06/17/2019 1845   PROTEINUR NEGATIVE 06/17/2019 1845   UROBILINOGEN negative (A) 12/21/2018 1101   UROBILINOGEN 0.2 11/13/2017 1102   NITRITE NEGATIVE 06/17/2019 1845   LEUKOCYTESUR SMALL (A) 06/17/2019 1845   Sepsis Labs: @LABRCNTIP (procalcitonin:4,lacticidven:4)  ) Recent Results (from the past 240 hour(s))  SARS CORONAVIRUS 2 (TAT 6-24 HRS) Nasopharyngeal Nasopharyngeal Swab     Status: None   Collection Time: 06/17/19  4:47 PM   Specimen: Nasopharyngeal Swab  Result Value Ref Range Status   SARS Coronavirus 2 NEGATIVE NEGATIVE Final    Comment: (NOTE) SARS-CoV-2 target  nucleic acids are NOT DETECTED. The SARS-CoV-2 RNA is generally detectable in upper and lower respiratory specimens during the acute phase of infection. Negative results do not preclude SARS-CoV-2 infection, do not rule out co-infections with other pathogens, and should not be used as the sole basis for treatment or other patient management decisions. Negative results must be combined with clinical observations, patient history, and epidemiological information. The expected result is Negative. Fact Sheet for Patients: SugarRoll.be Fact Sheet for Healthcare Providers: https://www.woods-mathews.com/ This test is not yet approved or cleared by the [place] FDA and  has been authorized for detection and/or diagnosis of SARS-CoV-2 by FDA under an Emergency Use Authorization (EUA). This EUA will remain  in effect (meaning this test can be used) for the duration of the COVID-19 declaration under Section 56 4(b)(1)  of the Act, 21 U.S.C. section 360bbb-3(b)(1), unless the authorization is terminated or revoked sooner. Performed at [facility], [place] [address] [place], [place] [address]       Studies: Ct Cervical Spine Wo Contrast  Result Date: 06/17/2019 CLINICAL DATA:  Syncope EXAM: CT CERVICAL SPINE WITHOUT CONTRAST TECHNIQUE: Multidetector CT imaging of the cervical spine was performed without intravenous contrast. Multiplanar CT image reconstructions were also generated. COMPARISON:  None. FINDINGS: Alignment: Reversal of the cervical lordosis centered at C4. Mild anterolisthesis at C3-C4 and retrolisthesis at C4-C5 and C5-C6. Skull base and vertebrae: No acute fracture. Vertebral body heights are maintained apart from degenerative endplate irregularity. Soft tissues and spinal canal: No prevertebral soft tissue swelling or fluid. No visible epidural collection or hematoma. Disc levels: There is multilevel disc space narrowing, endplate  osteophytes and irregularity, and facet and uncovertebral hypertrophy. There is no high-grade canal stenosis at any level. Foraminal stenosis is greatest on the left at C3-C4 and on the right at C5-C6. Upper chest: Scarring at the lung apices. Other: None. IMPRESSION: No acute cervical spine fracture. Electronically Signed   By: Macy Mis M.D.   On: 06/17/2019 14:16   Ct Head Code Stroke Wo Contrast  Result Date: 06/17/2019 CLINICAL DATA:  Code stroke. EXAM: CT HEAD WITHOUT CONTRAST TECHNIQUE: Contiguous axial images were obtained from the base of the skull through the vertex without intravenous contrast. COMPARISON:  None. FINDINGS: Brain: There is no acute intracranial hemorrhage, mass effect, or edema. Gray-white differentiation is preserved. There is no extra-axial fluid collection. Ventricles and sulci are within normal limits in size and configuration. Patchy hypoattenuation in the supratentorial white matter is nonspecific but probably reflects mild to moderate chronic microvascular ischemic changes. A small focal area of low attenuation within the right basal ganglia likely reflects an age-indeterminate small vessel infarct. Vascular: No hyperdense vessel. There is intracranial atherosclerotic calcification at the skull base. Skull: Unremarkable Sinuses/Orbits: Minor paranasal sinus mucosal thickening. No acute orbital abnormality. Other: Mastoid air cells are clear. ASPECTS [facility] Stroke Program Early CT Score) - Ganglionic level infarction (caudate, lentiform nuclei, internal capsule, insula, M1-M3 cortex): 7 - Supraganglionic infarction (M4-M6 cortex): 3 Total score (0-10 with 10 being normal): 10 IMPRESSION: 1. No acute intracranial hemorrhage or mass effect. Age indeterminate small vessel infarction of the right lentiform nucleus. 2. ASPECTS is 10. 3. Chronic microvascular ischemic changes. Electronically Signed   By: Macy Mis M.D.   On: 06/17/2019 14:06    Scheduled Meds: .  amLODipine  5 mg Oral Daily  . apixaban  2.5 mg Oral BID  . metoprolol tartrate  25 mg Oral BID    Continuous Infusions:   LOS: 0 days     Alma Friendly, MD [facility]  If 7PM-7AM, please contact night-coverage [email or web address] 06/18/2019, 1:28 PM

## 2019-06-18 NOTE — ED Notes (Signed)
Tele

## 2019-06-18 NOTE — Evaluation (Signed)
Physical Therapy Evaluation Patient Details Name: [name] MRN: [id number] DOB: 1930-02-26 Today's Date: 06/18/2019   History of Present Illness  [name] is a 85 y.o. female with medical history significant of hypertension, paroxysmal A. Fib-on Eliquis, arthritis presents to emergency department due to loss of consciousness and fall x2. PMH positive for A-fib (not on AC due to UGIB), HTN, macular degeneration, R hip fx 9/19.  Clinical Impression  Patient presents with mobility not far from her baseline.  Orthostatic BP testing WNL and pt denies symptoms.  Feel she can d/c home with family support and no follow up PT.  Will follow acutely if remains inpatient to progress mobility/balance due to more back pain than usual and likely sore from falls.    Orthostatic VS for the past 24 hrs (Last 3 readings):  BP- Lying Pulse- Lying BP- Sitting Pulse- Sitting BP- Standing at 0 minutes Pulse- Standing at 0 minutes BP- Standing at 3 minutes Pulse- Standing at 3 minutes  06/18/19 0921 118/74 92 131/90 112 132/80 109 (!) 137/93 112    Follow Up Recommendations No PT follow up    Equipment Recommendations  None recommended by PT    Recommendations for Other Services       Precautions / Restrictions Precautions Precautions: Fall      Mobility  Bed Mobility Overal bed mobility: Needs Assistance Bed Mobility: Supine to Sit     Supine to sit: Supervision;HOB elevated     General bed mobility comments: up to EOB with assist for safety  Transfers Overall transfer level: Needs assistance Equipment used: None Transfers: Sit to/from Stand Sit to Stand: Supervision         General transfer comment: assist for safety  Ambulation/Gait Ambulation/Gait assistance: Min guard;Min assist Gait Distance (Feet): 150 Feet Assistive device: None Gait Pattern/deviations: Step-through pattern;Decreased stride length;Shuffle;Antalgic;Decreased step length - right;Decreased stance time -  right     General Gait Details: reports back pain at baseline, but more sore today and noted antalgia on R and with decreased R foot clearance; assist for safety/balance  Stairs            Wheelchair Mobility    Modified Rankin (Stroke Patients Only)       Balance Overall balance assessment: Needs assistance Sitting-balance support: No upper extremity supported Sitting balance-Leahy Scale: Good     Standing balance support: No upper extremity supported Standing balance-Leahy Scale: Good Standing balance comment: standing to brush teeth, comb hair with S; some unsteadiness with ambulation occasional minguard A and use of wall rail                             Pertinent Vitals/Pain Pain Assessment: 0-10 Pain Score: 7  Pain Location: back on R side Pain Descriptors / Indicators: Discomfort;Sore Pain Intervention(s): Monitored during session;Repositioned    Home Living Family/patient expects to be discharged to:: Private residence Living Arrangements: Children(son) Available Help at Discharge: Family;Available 24 hours/day Type of Home: House Home Access: Stairs to enter Entrance Stairs-Rails: None Entrance Stairs-Number of Steps: 1 Home Layout: One level Home Equipment: Walker - 2 wheels;Cane - single point      Prior Function Level of Independence: Independent         Comments: independent with ADL; doesn't use assistive device     Hand Dominance        Extremity/Trunk Assessment   Upper Extremity Assessment Upper Extremity Assessment: Generalized weakness  Lower Extremity Assessment Lower Extremity Assessment: Generalized weakness    Cervical / Trunk Assessment Cervical / Trunk Assessment: Kyphotic;Other exceptions Cervical / Trunk Exceptions: scoliosis with c-curve to L  Communication   Communication: No difficulties  Cognition Arousal/Alertness: Awake/alert Behavior During Therapy: WFL for tasks assessed/performed Overall  Cognitive Status: History of cognitive impairments - at baseline                                 General Comments: son assisting to answer questions      General Comments General comments (skin integrity, edema, etc.): son in room and reports pt basically at her baseline, she denied lightheadedness; got back up from chair to use bathroom able to perform hygiene independently    Exercises     Assessment/Plan    PT Assessment Patient needs continued PT services  PT Problem List Decreased mobility;Decreased balance;Pain;Decreased activity tolerance;Decreased safety awareness;Decreased knowledge of use of DME       PT Treatment Interventions DME instruction;Therapeutic activities;Balance training;Therapeutic exercise;Functional mobility training;Gait training;Patient/family education    PT Goals (Current goals can be found in the Care Plan section)  Acute Rehab PT Goals Patient Stated Goal: to go home PT Goal Formulation: With patient/family Time For Goal Achievement: 06/25/19 Potential to Achieve Goals: Good    Frequency Min 3X/week   Barriers to discharge        Co-evaluation               AM-PAC PT "6 Clicks" Mobility  Outcome Measure Help needed turning from your back to your side while in a flat bed without using bedrails?: None Help needed moving from lying on your back to sitting on the side of a flat bed without using bedrails?: A Little Help needed moving to and from a bed to a chair (including a wheelchair)?: A Little Help needed standing up from a chair using your arms (e.g., wheelchair or bedside chair)?: None Help needed to walk in hospital room?: A Little Help needed climbing 3-5 steps with a railing? : A Little 6 Click Score: 20    End of Session   Activity Tolerance: Patient tolerated treatment well Patient left: in chair;with call bell/phone within reach;with family/visitor present;with chair alarm set Nurse Communication: Mobility  status PT Visit Diagnosis: Other abnormalities of gait and mobility (R26.89)    Time: [id number] PT Time Calculation (min) (ACUTE ONLY): 40 min   Charges:   PT Evaluation $PT Eval Moderate Complexity: 1 Mod PT Treatments $Gait Training: 8-22 mins $Therapeutic Activity: 8-22 mins        Magda Kiel, [place] [phone] 06/18/2019   Reginia Naas 06/18/2019, 1:28 PM

## 2019-06-18 NOTE — ED Notes (Signed)
Breakfast Ordered 

## 2019-06-18 NOTE — Procedures (Signed)
Patient Name: [name]  MRN: [id number]  Epilepsy Attending: Lora Havens  Referring Physician/Provider: Dr Lesia Sago Date: 06/18/2019 Duration: 23.42 mins  Patient history: 85yo F with syncope. EEG to evaluate for seizure  Level of alertness: awake  AEDs during EEG study: None  Technical aspects: This EEG study was done with scalp electrodes positioned according to the 10-20 International system of electrode placement. Electrical activity was acquired at a sampling rate of 500Hz  and reviewed with a high frequency filter of 70Hz  and a low frequency filter of 1Hz . EEG data were recorded continuously and digitally stored.   DESCRIPTION: No clear posterior dominant rhythm was seen. EEG showed intermittent 2-3hz  delta slowing in left temporal region. Hyperventilation and photic stimulation were not performed.  ABNORMALITY - Intermittent slow, left temporal  IMPRESSION: This study is suggestive of non specific cortical dysfunction in left temporal region.  No seizures or epileptiform discharges were seen throughout the recording.  Kaan Tosh [name]

## 2019-06-18 NOTE — ED Notes (Signed)
Pt had removed purewick, adult diaper and chuck wet, both changed and new purewick applied.

## 2019-06-18 NOTE — Progress Notes (Signed)
  Echocardiogram 2D Echocardiogram has been performed.  Darlina Sicilian M 06/18/2019, 11:33 AM

## 2019-06-18 NOTE — ED Notes (Signed)
ED TO INPATIENT HANDOFF REPORT  ED Nurse Name and Phone #: Marya Amsler RN [phone]  S Name/Age/Gender [name] 85 y.o. female Room/Bed: 011C/011C  Code Status   Code Status: DNR  Home/SNF/Other Home Patient oriented to: self Is this baseline? Yes   Triage Complete: Triage complete  Chief Complaint code stroke  Triage Note Pt arrives with ems as code stroke, LSN 1230 today, pt was at home in the kitchen when her son her a thump and found the pt unresponsive in the kitchen. Son reports to EMS that the pt was unresponsive for about 45 seconds. Pt took Eliquis at 830 this morning. Pt arrives to ED alert, oriented, c collar in place, c.o mild headache. Pt taken to CT   Allergies No Known Allergies  Level of Care/Admitting Diagnosis ED Disposition    ED Disposition Condition [facility]: [facility] [100100]  Level of Care: Telemetry Medical [104]  I expect the patient will be discharged within 24 hours: Yes  LOW acuity---Tx typically complete <24 hrs---ACUTE conditions typically can be evaluated <24 hours---LABS likely to return to acceptable levels <24 hours---IS near functional baseline---EXPECTED to return to current living arrangement---NOT newly hypoxic: Meets criteria for 5C-Observation unit  Covid Evaluation: Asymptomatic Screening Protocol (No Symptoms)  Diagnosis: Syncope [206001]  Admitting Physician: Mckinley Jewel [id number]  Attending Physician: Mckinley Jewel [phone]  PT Class (Do Not Modify): Observation [104]  PT Acc Code (Do Not Modify): Observation [10022]       B Medical/Surgery History Past Medical History:  Diagnosis Date  . Anemia   . Arthritis    "back" (01/16/2018)  . Atrial fibrillation ([place])   . Cancer [facility])    low grade papillary, most recent recurrence 2011,  Dr Karsten Ro  . 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 History of blood transfusion 01/15/2018   S/P OR  . Hypertension   .  Hyponatremia 01/26/12   Na+ 131  . Macular degeneration, bilateral    "were wet; dry now" (01/16/2018)  . Other abnormal glucose 06/14/12   Fasting blood glucose 128; A1c 5.8% on 12/15/11   Past Surgical History:  Procedure Laterality Date  . ABDOMINAL HYSTERECTOMY  1970s   with oopherectomy for incidenctal cyst (no PMH of abnormal PAP)  . APPENDECTOMY     1970s  . BLADDER TUMOR EXCISION  2011 & 2013   Dr Karsten Ro  . CATARACT EXTRACTION, BILATERAL  2014   Dr Herbert Deaner  . COLONOSCOPY     negative; Dr Olevia Perches  . CYSTOSCOPY      multiple since tumor excision;Dr Ottelin  . DILATION AND CURETTAGE OF UTERUS    . FRACTURE SURGERY    . INTRAMEDULLARY (IM) NAIL INTERTROCHANTERIC Right 01/15/2018   Procedure: INTRAMEDULLARY (IM) NAIL INTERTROCHANTRIC;  Surgeon: Melrose Nakayama, MD;  Location: [facility];  Service: Orthopedics;  Laterality: Right;  . KYPHOPLASTY N/A 03/08/2018   Procedure: THORACIC 12 KYPHOPLASTY;  Surgeon: Phylliss Bob, MD;  Location: [facility];  Service: Orthopedics;  Laterality: N/A;  . TONSILLECTOMY       A IV Location/Drains/Wounds Patient Lines/Drains/Airways Status   Active Line/Drains/Airways    Name:   Placement date:   Placement time:   Site:   Days:   Peripheral IV 06/17/19 Right Antecubital   06/17/19    1401    Antecubital   1          Intake/Output Last 24 hours  Intake/Output Summary (Last 24  hours) at 06/18/2019 0739 Last data filed at 06/17/2019 1849 Gross per 24 hour  Intake 1000 ml  Output -  Net 1000 ml    Labs/Imaging Results for orders placed or performed during the hospital encounter of 06/17/19 (from the past 48 hour(s))  CBG monitoring, ED     Status: Abnormal   Collection Time: 06/17/19  1:40 PM  Result Value Ref Range   Glucose-Capillary 120 (H) 70 - 99 mg/dL  I-stat chem 8, ED     Status: Abnormal   Collection Time: 06/17/19  1:44 PM  Result Value Ref Range   Sodium 136 135 - 145 mmol/L   Potassium 4.1 3.5 - 5.1 mmol/L   Chloride 100 98 -  111 mmol/L   BUN 21 8 - 23 mg/dL   Creatinine, Ser 0.70 0.44 - 1.00 mg/dL   Glucose, Bld 122 (H) 70 - 99 mg/dL   Calcium, Ion 1.12 (L) 1.15 - 1.40 mmol/L   TCO2 27 22 - 32 mmol/L   Hemoglobin 13.9 12.0 - 15.0 g/dL   HCT 41.0 36.0 - 46.0 %  Protime-INR     Status: None   Collection Time: 06/17/19  1:46 PM  Result Value Ref Range   Prothrombin Time 13.3 11.4 - 15.2 seconds   INR 1.0 0.8 - 1.2    Comment: (NOTE) INR goal varies based on device and disease states. Performed at [facility], [place] [address], [place], [place] [address]   APTT     Status: None   Collection Time: 06/17/19  1:46 PM  Result Value Ref Range   aPTT 31 24 - 36 seconds    Comment: Performed at [facility] [address], [place], [place] [address]  CBC     Status: None   Collection Time: 06/17/19  1:46 PM  Result Value Ref Range   WBC 7.8 4.0 - 10.5 K/uL   RBC 4.23 3.87 - 5.11 MIL/uL   Hemoglobin 13.1 12.0 - 15.0 g/dL   HCT 40.6 36.0 - 46.0 %   MCV 96.0 80.0 - 100.0 fL   MCH 31.0 26.0 - 34.0 pg   MCHC 32.3 30.0 - 36.0 g/dL   RDW 13.4 11.5 - 15.5 %   Platelets 262 150 - 400 K/uL   nRBC 0.0 0.0 - 0.2 %    Comment: Performed at [facility], [place] [address], [place], [place] [address]  Differential     Status: None   Collection Time: 06/17/19  1:46 PM  Result Value Ref Range   Neutrophils Relative % 76 %   Neutro Abs 5.9 1.7 - 7.7 K/uL   Lymphocytes Relative 14 %   Lymphs Abs 1.1 0.7 - 4.0 K/uL   Monocytes Relative 8 %   Monocytes Absolute 0.7 0.1 - 1.0 K/uL   Eosinophils Relative 1 %   Eosinophils Absolute 0.1 0.0 - 0.5 K/uL   Basophils Relative 0 %   Basophils Absolute 0.0 0.0 - 0.1 K/uL   Immature Granulocytes 1 %   Abs Immature Granulocytes 0.05 0.00 - 0.07 K/uL    Comment: Performed at [facility] [address] [place],  [address]  Comprehensive metabolic panel     Status: Abnormal   Collection Time: 06/17/19  1:46 PM  Result Value Ref Range   Sodium 136  135 - 145 mmol/L   Potassium 4.2 3.5 - 5.1 mmol/L   Chloride 103 98 - 111 mmol/L   CO2 25 22 -  32 mmol/L   Glucose, Bld 124 (H) 70 - 99 mg/dL   BUN 20 8 - 23 mg/dL   Creatinine, Ser 0.78 0.44 - 1.00 mg/dL   Calcium 8.8 (L) 8.9 - 10.3 mg/dL   Total Protein 6.5 6.5 - 8.1 g/dL   Albumin 3.4 (L) 3.5 - 5.0 g/dL   AST 19 15 - 41 U/L   ALT 22 0 - 44 U/L   Alkaline Phosphatase 91 38 - 126 U/L   Total Bilirubin 0.8 0.3 - 1.2 mg/dL   GFR calc non Af Amer >60 >60 mL/min   GFR calc Af Amer >60 >60 mL/min   Anion gap 8 5 - 15    Comment: Performed at [facility] [address], [place], [place] [address]  Magnesium     Status: None   Collection Time: 06/17/19  1:51 PM  Result Value Ref Range   Magnesium 2.2 1.7 - 2.4 mg/dL    Comment: Performed at [facility] [address], [place], [place] [address]  Phosphorus     Status: None   Collection Time: 06/17/19  1:51 PM  Result Value Ref Range   Phosphorus 3.7 2.5 - 4.6 mg/dL    Comment: Performed at [facility] [address] [place], [place] [address]  SARS CORONAVIRUS 2 (TAT 6-24 HRS) Nasopharyngeal Nasopharyngeal Swab     Status: None   Collection Time: 06/17/19  4:47 PM   Specimen: Nasopharyngeal Swab  Result Value Ref Range   SARS Coronavirus 2 NEGATIVE NEGATIVE    Comment: (NOTE) SARS-CoV-2 target nucleic acids are NOT DETECTED. The SARS-CoV-2 RNA is generally detectable in upper and lower respiratory specimens during the acute phase of infection. Negative results do not preclude SARS-CoV-2 infection, do not rule out co-infections with other pathogens, and should not be used as the sole basis for treatment or other patient management decisions. Negative results must be combined with clinical observations, patient history, and epidemiological information. The expected result is Negative. Fact Sheet for Patients: SugarRoll.be Fact Sheet for Healthcare  Providers: https://www.woods-mathews.com/ This test is not yet approved or cleared by the [place] FDA and  has been authorized for detection and/or diagnosis of SARS-CoV-2 by FDA under an Emergency Use Authorization (EUA). This EUA will remain  in effect (meaning this test can be used) for the duration of the COVID-19 declaration under Section 56 4(b)(1) of the Act, 21 U.S.C. section 360bbb-3(b)(1), unless the authorization is terminated or revoked sooner. Performed at [facility], [place] [address], [place], [place] [address]   Urinalysis, Routine w reflex microscopic     Status: Abnormal   Collection Time: 06/17/19  6:45 PM  Result Value Ref Range   Color, Urine STRAW (A) YELLOW   APPearance CLEAR CLEAR   Specific Gravity, Urine 1.005 1.005 - 1.030   pH 8.0 5.0 - 8.0   Glucose, UA NEGATIVE NEGATIVE mg/dL   Hgb urine dipstick NEGATIVE NEGATIVE   Bilirubin Urine NEGATIVE NEGATIVE   Ketones, ur 5 (A) NEGATIVE mg/dL   Protein, ur NEGATIVE NEGATIVE mg/dL   Nitrite NEGATIVE NEGATIVE   Leukocytes,Ua SMALL (A) NEGATIVE   RBC / HPF 0-5 0 - 5 RBC/hpf   WBC, UA 0-5 0 - 5 WBC/hpf   Bacteria, UA MANY (A) NONE SEEN   Mucus PRESENT     Comment: Performed at [facility], [address], [place],  [address]  TSH     Status: None  Collection Time: 06/17/19  8:00 PM  Result Value Ref Range   TSH 1.497 0.350 - 4.500 uIU/mL    Comment: Performed by a 3rd Generation assay with a functional sensitivity of <=0.01 uIU/mL. Performed at [facility], [place] [address] [place], [place] [address]   Troponin I (High Sensitivity)     Status: None   Collection Time: 06/17/19  8:00 PM  Result Value Ref Range   Troponin I (High Sensitivity) 3 <18 ng/L    Comment: (NOTE) Elevated high sensitivity troponin I (hsTnI) values and significant  changes across serial measurements may suggest ACS but many other  chronic and acute conditions are known to elevate hsTnI  results.  Refer to the "Links" section for chest pain algorithms and additional  guidance. Performed at [facility], [place] [address] [place], [place] [address]   Comprehensive metabolic panel     Status: Abnormal   Collection Time: 06/18/19  3:23 AM  Result Value Ref Range   Sodium 137 135 - 145 mmol/L   Potassium 3.4 (L) 3.5 - 5.1 mmol/L   Chloride 102 98 - 111 mmol/L   CO2 23 22 - 32 mmol/L   Glucose, Bld 131 (H) 70 - 99 mg/dL   BUN 13 8 - 23 mg/dL   Creatinine, Ser 0.75 0.44 - 1.00 mg/dL   Calcium 8.5 (L) 8.9 - 10.3 mg/dL   Total Protein 6.3 (L) 6.5 - 8.1 g/dL   Albumin 3.3 (L) 3.5 - 5.0 g/dL   AST 18 15 - 41 U/L   ALT 20 0 - 44 U/L   Alkaline Phosphatase 84 38 - 126 U/L   Total Bilirubin 1.1 0.3 - 1.2 mg/dL   GFR calc non Af Amer >60 >60 mL/min   GFR calc Af Amer >60 >60 mL/min   Anion gap 12 5 - 15    Comment: Performed at [facility], [place] [address] [place] [address]  CBC     Status: None   Collection Time: 06/18/19  3:23 AM  Result Value Ref Range   WBC 8.7 4.0 - 10.5 K/uL   RBC 4.06 3.87 - 5.11 MIL/uL   Hemoglobin 12.5 12.0 - 15.0 g/dL   HCT 38.5 36.0 - 46.0 %   MCV 94.8 80.0 - 100.0 fL   MCH 30.8 26.0 - 34.0 pg   MCHC 32.5 30.0 - 36.0 g/dL   RDW 13.4 11.5 - 15.5 %   Platelets 247 150 - 400 K/uL   nRBC 0.0 0.0 - 0.2 %    Comment: Performed at [facility], [place] [address], [place], [place] [address]  Troponin I (High Sensitivity)     Status: None   Collection Time: 06/18/19  3:23 AM  Result Value Ref Range   Troponin I (High Sensitivity) 3 <18 ng/L    Comment: (NOTE) Elevated high sensitivity troponin I (hsTnI) values and significant  changes across serial measurements may suggest ACS but many other  chronic and acute conditions are known to elevate hsTnI results.  Refer to the "Links" section for chest pain algorithms and additional  guidance. Performed at [facility], [place] [address] [place], [place] [address]    Ct  Cervical Spine Wo Contrast  Result Date: 06/17/2019 CLINICAL DATA:  Syncope EXAM: CT CERVICAL SPINE WITHOUT CONTRAST TECHNIQUE: Multidetector CT imaging of the cervical spine was performed without intravenous contrast. Multiplanar CT image reconstructions were also generated. COMPARISON:  None. FINDINGS: Alignment: Reversal  of the cervical lordosis centered at C4. Mild anterolisthesis at C3-C4 and retrolisthesis at C4-C5 and C5-C6. Skull base and vertebrae: No acute fracture. Vertebral body heights are maintained apart from degenerative endplate irregularity. Soft tissues and spinal canal: No prevertebral soft tissue swelling or fluid. No visible epidural collection or hematoma. Disc levels: There is multilevel disc space narrowing, endplate osteophytes and irregularity, and facet and uncovertebral hypertrophy. There is no high-grade canal stenosis at any level. Foraminal stenosis is greatest on the left at C3-C4 and on the right at C5-C6. Upper chest: Scarring at the lung apices. Other: None. IMPRESSION: No acute cervical spine fracture. Electronically Signed   By: Macy Mis M.D.   On: 06/17/2019 14:16   Ct Head Code Stroke Wo Contrast  Result Date: 06/17/2019 CLINICAL DATA:  Code stroke. EXAM: CT HEAD WITHOUT CONTRAST TECHNIQUE: Contiguous axial images were obtained from the base of the skull through the vertex without intravenous contrast. COMPARISON:  None. FINDINGS: Brain: There is no acute intracranial hemorrhage, mass effect, or edema. Gray-white differentiation is preserved. There is no extra-axial fluid collection. Ventricles and sulci are within normal limits in size and configuration. Patchy hypoattenuation in the supratentorial white matter is nonspecific but probably reflects mild to moderate chronic microvascular ischemic changes. A small focal area of low attenuation within the right basal ganglia likely reflects an age-indeterminate small vessel infarct. Vascular: No hyperdense vessel.  There is intracranial atherosclerotic calcification at the skull base. Skull: Unremarkable Sinuses/Orbits: Minor paranasal sinus mucosal thickening. No acute orbital abnormality. Other: Mastoid air cells are clear. ASPECTS [facility] Stroke Program Early CT Score) - Ganglionic level infarction (caudate, lentiform nuclei, internal capsule, insula, M1-M3 cortex): 7 - Supraganglionic infarction (M4-M6 cortex): 3 Total score (0-10 with 10 being normal): 10 IMPRESSION: 1. No acute intracranial hemorrhage or mass effect. Age indeterminate small vessel infarction of the right lentiform nucleus. 2. ASPECTS is 10. 3. Chronic microvascular ischemic changes. Electronically Signed   By: Macy Mis M.D.   On: 06/17/2019 14:06    Pending Labs Unresulted Labs (From admission, onward)   None      Vitals/Pain Today's Vitals   06/18/19 0415 06/18/19 0445 06/18/19 0700 06/18/19 0730  BP: (!) 132/96 138/76 131/79 115/67  Pulse: 91 100 92 (!) 106  Resp: 19 18 19  (!) 22  Temp:      TempSrc:      SpO2: 100% 100% 98% 97%  PainSc:        Isolation Precautions No active isolations  Medications Medications  acetaminophen (TYLENOL) tablet 650 mg (650 mg Oral Given 06/17/19 2139)    Or  acetaminophen (TYLENOL) suppository 650 mg ( Rectal See Alternative 06/17/19 2139)  ondansetron (ZOFRAN) tablet 4 mg (has no administration in time range)    Or  ondansetron (ZOFRAN) injection 4 mg (has no administration in time range)  apixaban (ELIQUIS) tablet 2.5 mg (2.5 mg Oral Given 06/17/19 2138)  amLODipine (NORVASC) tablet 5 mg (has no administration in time range)  metoprolol tartrate (LOPRESSOR) tablet 25 mg (25 mg Oral Given 06/17/19 2139)  labetalol (NORMODYNE) injection 10 mg (has no administration in time range)  sodium chloride flush (NS) 0.9 % injection 3 mL (3 mLs Intravenous Given 06/17/19 1806)  sodium chloride 0.9 % bolus 1,000 mL (0 mLs Intravenous Stopped 06/17/19 1849)  metoprolol tartrate  (LOPRESSOR) injection 2.5 mg (2.5 mg Intravenous Given 06/17/19 1806)    Mobility walks High fall risk   Focused Assessments Neuro Assessment Handoff:  Swallow screen pass? Yes  Cardiac Rhythm: Atrial fibrillation NIH Stroke Scale ( + Modified Stroke Scale Criteria)  Interval: Initial Level of Consciousness (1a.)   : Alert, keenly responsive LOC Questions (1b. )   +: Answers both questions correctly LOC Commands (1c. )   + : Performs both tasks correctly Best Gaze (2. )  +: Normal Visual (3. )  +: No visual loss Facial Palsy (4. )    : Normal symmetrical movements Motor Arm, Left (5a. )   +: No drift Motor Arm, Right (5b. )   +: No drift Motor Leg, Left (6a. )   +: No drift Motor Leg, Right (6b. )   +: No drift Limb Ataxia (7. ): Absent Sensory (8. )   +: Normal, no sensory loss Best Language (9. )   +: No aphasia Dysarthria (10. ): Normal Extinction/Inattention (11.)   +: No Abnormality Modified SS Total  +: 0 Complete NIHSS TOTAL: 0 Last date known well: 06/17/19 Last time known well: 1230 Neuro Assessment: Within Defined Limits Neuro Checks:   Initial (06/17/19 1345)  Last Documented NIHSS Modified Score: 0 (06/17/19 2005) Has TPA been given? No If patient is a Neuro Trauma and patient is going to OR before floor call report to [facility] nurse: [phone] or [phone]     R Recommendations: See Admitting Provider Note  Report given to:   Additional Notes:

## 2019-06-19 DIAGNOSIS — R000 Tachycardia, unspecified: Secondary | ICD-10-CM | POA: Diagnosis present

## 2019-06-19 DIAGNOSIS — I4891 Unspecified atrial fibrillation: Secondary | ICD-10-CM | POA: Diagnosis present

## 2019-06-19 DIAGNOSIS — S0003XA Contusion of scalp, initial encounter: Secondary | ICD-10-CM | POA: Diagnosis present

## 2019-06-19 DIAGNOSIS — E785 Hyperlipidemia, unspecified: Secondary | ICD-10-CM | POA: Diagnosis present

## 2019-06-19 DIAGNOSIS — Z823 Family history of stroke: Secondary | ICD-10-CM | POA: Diagnosis not present

## 2019-06-19 DIAGNOSIS — Z66 Do not resuscitate: Secondary | ICD-10-CM | POA: Diagnosis not present

## 2019-06-19 DIAGNOSIS — I10 Essential (primary) hypertension: Secondary | ICD-10-CM | POA: Diagnosis not present

## 2019-06-19 DIAGNOSIS — R8281 Pyuria: Secondary | ICD-10-CM | POA: Diagnosis present

## 2019-06-19 DIAGNOSIS — Z833 Family history of diabetes mellitus: Secondary | ICD-10-CM | POA: Diagnosis not present

## 2019-06-19 DIAGNOSIS — Y92000 Kitchen of unspecified non-institutional (private) residence as  the place of occurrence of the external cause: Secondary | ICD-10-CM | POA: Diagnosis not present

## 2019-06-19 DIAGNOSIS — E871 Hypo-osmolality and hyponatremia: Secondary | ICD-10-CM | POA: Diagnosis not present

## 2019-06-19 DIAGNOSIS — Z8551 Personal history of malignant neoplasm of bladder: Secondary | ICD-10-CM | POA: Diagnosis not present

## 2019-06-19 DIAGNOSIS — I480 Paroxysmal atrial fibrillation: Secondary | ICD-10-CM | POA: Diagnosis not present

## 2019-06-19 DIAGNOSIS — Z20828 Contact with and (suspected) exposure to other viral communicable diseases: Secondary | ICD-10-CM | POA: Diagnosis present

## 2019-06-19 DIAGNOSIS — E872 Acidosis: Secondary | ICD-10-CM | POA: Diagnosis not present

## 2019-06-19 DIAGNOSIS — Z8249 Family history of ischemic heart disease and other diseases of the circulatory system: Secondary | ICD-10-CM | POA: Diagnosis not present

## 2019-06-19 DIAGNOSIS — R55 Syncope and collapse: Secondary | ICD-10-CM | POA: Diagnosis not present

## 2019-06-19 DIAGNOSIS — F0390 Unspecified dementia without behavioral disturbance: Secondary | ICD-10-CM | POA: Diagnosis present

## 2019-06-19 DIAGNOSIS — Z7901 Long term (current) use of anticoagulants: Secondary | ICD-10-CM | POA: Diagnosis not present

## 2019-06-19 DIAGNOSIS — I071 Rheumatic tricuspid insufficiency: Secondary | ICD-10-CM | POA: Diagnosis present

## 2019-06-19 DIAGNOSIS — W1830XA Fall on same level, unspecified, initial encounter: Secondary | ICD-10-CM | POA: Diagnosis present

## 2019-06-19 LAB — CBC WITH DIFFERENTIAL/PLATELET
Abs Immature Granulocytes: 0.04 10*3/uL (ref 0.00–0.07)
Basophils Absolute: 0 10*3/uL (ref 0.0–0.1)
Basophils Relative: 0 %
Eosinophils Absolute: 0.1 10*3/uL (ref 0.0–0.5)
Eosinophils Relative: 1 %
HCT: 37.6 % (ref 36.0–46.0)
Hemoglobin: 12.1 g/dL (ref 12.0–15.0)
Immature Granulocytes: 1 %
Lymphocytes Relative: 16 %
Lymphs Abs: 1.3 10*3/uL (ref 0.7–4.0)
MCH: 30.7 pg (ref 26.0–34.0)
MCHC: 32.2 g/dL (ref 30.0–36.0)
MCV: 95.4 fL (ref 80.0–100.0)
Monocytes Absolute: 1.1 10*3/uL — ABNORMAL HIGH (ref 0.1–1.0)
Monocytes Relative: 14 %
Neutro Abs: 5.7 10*3/uL (ref 1.7–7.7)
Neutrophils Relative %: 68 %
Platelets: 238 10*3/uL (ref 150–400)
RBC: 3.94 MIL/uL (ref 3.87–5.11)
RDW: 13.5 % (ref 11.5–15.5)
WBC: 8.3 10*3/uL (ref 4.0–10.5)
nRBC: 0 % (ref 0.0–0.2)

## 2019-06-19 LAB — BASIC METABOLIC PANEL
Anion gap: 9 (ref 5–15)
BUN: 17 mg/dL (ref 8–23)
CO2: 22 mmol/L (ref 22–32)
Calcium: 8.4 mg/dL — ABNORMAL LOW (ref 8.9–10.3)
Chloride: 104 mmol/L (ref 98–111)
Creatinine, Ser: 0.76 mg/dL (ref 0.44–1.00)
GFR calc Af Amer: >60 mL/min (ref >60)
GFR calc non Af Amer: >60 mL/min (ref >60)
Glucose, Bld: 123 mg/dL — ABNORMAL HIGH (ref 70–99)
Potassium: 4.5 mmol/L (ref 3.5–5.1)
Sodium: 135 mmol/L (ref 135–145)

## 2019-06-19 MED ORDER — METOPROLOL TARTRATE 12.5 MG HALF TABLET
12.5000 mg | ORAL_TABLET | Freq: Once | ORAL | Status: AC
  Administered 2019-06-19: 12.5 mg via ORAL
  Filled 2019-06-19: qty 1

## 2019-06-19 MED ORDER — METOPROLOL TARTRATE 25 MG PO TABS
37.5000 mg | ORAL_TABLET | Freq: Two times a day (BID) | ORAL | Status: DC
  Administered 2019-06-19 – 2019-06-20 (×2): 37.5 mg via ORAL
  Filled 2019-06-19 (×2): qty 1

## 2019-06-19 NOTE — Progress Notes (Signed)
PROGRESS NOTE  [name] [id number] DOB: 1929/09/05 DOA: 06/17/2019 PCP: Binnie Rail, MD  HPI/Recap of past 24 hours: HPI from Dr Early Osmond [name] is a 85 y.o. female with medical history significant of hypertension, paroxysmal A. Fib-on Eliquis, arthritis presents to emergency department due to loss of consciousness (for few seconds) and fall x2.   EDP consulted neurology who recommended syncope work-up.  Neurology signed off.   Patient is c/o being anxious -- her HR is >150 Overnight she became combative  Assessment/Plan: Principal Problem:   Syncope Active Problems:   Essential hypertension   Paroxysmal atrial fibrillation (HCC)   Atrial fibrillation with RVR (HCC)  Syncope Unknown etiology -? A fib-- has parox a fib and now with RVR -EEG -carotid Doppler -Orthostatic vitals unremarkable -TSH unremarkable -UA unremarkable -Chest x-ray pending -CT head and cervical spine came back negative for acute findings -Echo showed EF of 50 to 55% -EDP consulted neurology-recommended syncope work-up, neurology signed off -- physical therapy: no follow up  Paroxysmal A. Fib- now with RVR -increase metoprolol dose - Eliquis  Hypertension Stable D/c amlodipine so HR controlling medications can be increased  History of T12 compression fracture Status post kyphoplasty Continue Tylenol as needed for pain.       Code Status: DNR   Family Communication: LM for son  Disposition Plan: needs to remain in the hospital for HR control   Consultants:  Neurology (signed off)  DVT prophylaxis: Eliquis   Objective: Vitals:   06/19/19 0309 06/19/19 0310 06/19/19 0350 06/19/19 0952  BP: 124/81   (!) 142/116  Pulse: (!) 130  (!) 104 (!) 161  Resp: 18   16  Temp:  99.1 F (37.3 C)  98 F (36.7 C)  TempSrc:  Oral  Oral  SpO2: 97%   100%    Exam:  In bed, appears anxious  HR irregular and fast  No increased work of breathing  Alert and  pleasant/cooperative  No LE edema  Data Reviewed: CBC: Recent Labs  Lab 06/17/19 1344 06/17/19 1346 06/18/19 0323 06/19/19 0306  WBC  --  7.8 8.7 8.3  NEUTROABS  --  5.9  --  5.7  HGB 13.9 13.1 12.5 12.1  HCT 41.0 40.6 38.5 37.6  MCV  --  96.0 94.8 95.4  PLT  --  262 247 [id number]   Basic Metabolic Panel: Recent Labs  Lab 06/17/19 1344 06/17/19 1346 06/17/19 1351 06/18/19 0323 06/19/19 0306  NA 136 136  --  137 135  K 4.1 4.2  --  3.4* 4.5  CL 100 103  --  102 104  CO2  --  25  --  23 22  GLUCOSE 122* 124*  --  131* 123*  BUN 21 20  --  13 17  CREATININE 0.70 0.78  --  0.75 0.76  CALCIUM  --  8.8*  --  8.5* 8.4*  MG  --   --  2.2  --   --   PHOS  --   --  3.7  --   --    GFR: CrCl cannot be calculated (Unknown ideal weight.). Liver Function Tests: Recent Labs  Lab 06/17/19 1346 06/18/19 0323  AST 19 18  ALT 22 20  ALKPHOS 91 84  BILITOT 0.8 1.1  PROT 6.5 6.3*  ALBUMIN 3.4* 3.3*   No results for input(s): LIPASE, AMYLASE in the last 168 hours. No results for input(s): AMMONIA in the last 168 hours. Coagulation  Profile: Recent Labs  Lab 06/17/19 1346  INR 1.0   Cardiac Enzymes: No results for input(s): CKTOTAL, CKMB, CKMBINDEX, TROPONINI in the last 168 hours. BNP (last 3 results) No results for input(s): PROBNP in the last 8760 hours. HbA1C: No results for input(s): HGBA1C in the last 72 hours. CBG: Recent Labs  Lab 06/17/19 1340  GLUCAP 120*   Lipid Profile: No results for input(s): CHOL, HDL, LDLCALC, TRIG, CHOLHDL, LDLDIRECT in the last 72 hours. Thyroid Function Tests: Recent Labs    06/17/19 2000  TSH 1.497   Anemia Panel: No results for input(s): VITAMINB12, FOLATE, FERRITIN, TIBC, IRON, RETICCTPCT in the last 72 hours. Urine analysis:    Component Value Date/Time   COLORURINE STRAW (A) 06/17/2019 1845   APPEARANCEUR CLEAR 06/17/2019 1845   LABSPEC 1.005 06/17/2019 1845   PHURINE 8.0 06/17/2019 1845   GLUCOSEU NEGATIVE 06/17/2019  1845   GLUCOSEU NEGATIVE 11/13/2017 1102   HGBUR NEGATIVE 06/17/2019 1845   HGBUR negative 09/10/2008 0752   BILIRUBINUR NEGATIVE 06/17/2019 1845   BILIRUBINUR negative 12/21/2018 1101   KETONESUR 5 (A) 06/17/2019 1845   PROTEINUR NEGATIVE 06/17/2019 1845   UROBILINOGEN negative (A) 12/21/2018 1101   UROBILINOGEN 0.2 11/13/2017 1102   NITRITE NEGATIVE 06/17/2019 1845   LEUKOCYTESUR SMALL (A) 06/17/2019 1845    Recent Results (from the past 240 hour(s))  SARS CORONAVIRUS 2 (TAT 6-24 HRS) Nasopharyngeal Nasopharyngeal Swab     Status: None   Collection Time: 06/17/19  4:47 PM   Specimen: Nasopharyngeal Swab  Result Value Ref Range Status   SARS Coronavirus 2 NEGATIVE NEGATIVE Final    Comment: (NOTE) SARS-CoV-2 target nucleic acids are NOT DETECTED. The SARS-CoV-2 RNA is generally detectable in upper and lower respiratory specimens during the acute phase of infection. Negative results do not preclude SARS-CoV-2 infection, do not rule out co-infections with other pathogens, and should not be used as the sole basis for treatment or other patient management decisions. Negative results must be combined with clinical observations, patient history, and epidemiological information. The expected result is Negative. Fact Sheet for Patients: SugarRoll.be Fact Sheet for Healthcare Providers: https://www.woods-mathews.com/ This test is not yet approved or cleared by the [place] FDA and  has been authorized for detection and/or diagnosis of SARS-CoV-2 by FDA under an Emergency Use Authorization (EUA). This EUA will remain  in effect (meaning this test can be used) for the duration of the COVID-19 declaration under Section 56 4(b)(1) of the Act, 21 U.S.C. section 360bbb-3(b)(1), unless the authorization is terminated or revoked sooner. Performed at [facility], [place] [address], [place], [place] [address]       Studies: Dg Chest Port  1 View  Result Date: 06/18/2019 CLINICAL DATA:  Syncope. EXAM: PORTABLE CHEST 1 VIEW COMPARISON:  03/29/2018 FINDINGS: Heart size is normal considering the AP portable technique. Pulmonary vascularity is normal. Aortic atherosclerosis. Lungs are clear. No effusions. No acute bone abnormalities. Old right mid humerus fracture. IMPRESSION: 1. No active disease. 2.  Aortic Atherosclerosis (ICD10-I70.0). Electronically Signed   By: Lorriane Shire M.D.   On: 06/18/2019 14:13   Vas US Carotid  Result Date: 06/18/2019 Carotid Arterial Duplex Study Indications:       Syncope. Risk Factors:      Hypertension. Other Factors:     A-fib. Comparison Study:  No prior study Performing Technologist: Maudry Mayhew MHA, RDMS, RVT, RDCS  Examination Guidelines: A complete evaluation includes B-mode imaging, spectral Doppler, color Doppler, and power Doppler as needed  of all accessible portions of each vessel. Bilateral testing is considered an integral part of a complete examination. Limited examinations for reoccurring indications may be performed as noted.  Right Carotid Findings: +----------+--------+--------+--------+------------------+------------------+             PSV cm/s EDV cm/s Stenosis Plaque Description Comments            +----------+--------+--------+--------+------------------+------------------+  CCA Prox   67       11                                   intimal thickening  +----------+--------+--------+--------+------------------+------------------+  CCA Distal 56       12                                                       +----------+--------+--------+--------+------------------+------------------+  ICA Prox   42       9                                    intimal thickening  +----------+--------+--------+--------+------------------+------------------+  ICA Distal 100      11                                                       +----------+--------+--------+--------+------------------+------------------+   ECA        92                                                                +----------+--------+--------+--------+------------------+------------------+ +----------+--------+-------+----------------+-------------------+             PSV cm/s EDV cms Describe         Arm Pressure (mmHG)  +----------+--------+-------+----------------+-------------------+  Subclavian 109              Multiphasic, WNL                      +----------+--------+-------+----------------+-------------------+ +---------+--------+--+--------+-+---------+  Vertebral PSV cm/s 26 EDV cm/s 8 Antegrade  +---------+--------+--+--------+-+---------+  Left Carotid Findings: +----------+--------+-------+--------+----------------------+------------------+             PSV cm/s EDV     Stenosis Plaque Description     Comments                                 cm/s                                                        +----------+--------+-------+--------+----------------------+------------------+  CCA Prox   59       10  intimal thickening  +----------+--------+-------+--------+----------------------+------------------+  CCA Distal 82       12               smooth and                                                                       heterogenous                               +----------+--------+-------+--------+----------------------+------------------+  ICA Prox   45       11               smooth and                                                                       heterogenous                               +----------+--------+-------+--------+----------------------+------------------+  ICA Distal 69       13                                                          +----------+--------+-------+--------+----------------------+------------------+  ECA        78       5                                                           +----------+--------+-------+--------+----------------------+------------------+  +----------+--------+--------+----------------+-------------------+             PSV cm/s EDV cm/s Describe         Arm Pressure (mmHG)  +----------+--------+--------+----------------+-------------------+  Subclavian 151               Multiphasic, WNL                      +----------+--------+--------+----------------+-------------------+ +---------+--------+--+--------+--+---------+  Vertebral PSV cm/s 46 EDV cm/s 11 Antegrade  +---------+--------+--+--------+--+---------+  Summary: Right Carotid: Velocities in the right ICA are consistent with a 1-39% stenosis. Left Carotid: Velocities in the left ICA are consistent with a 1-39% stenosis. Vertebrals:  Bilateral vertebral arteries demonstrate antegrade flow. Subclavians: Normal flow hemodynamics were seen in bilateral subclavian              arteries. *See table(s) above for measurements and observations.  Electronically signed by Deitra Mayo MD on 06/18/2019 at 5:40:30 PM.    Final     Scheduled Meds:  apixaban  2.5 mg Oral BID   metoprolol tartrate  37.5 mg Oral BID  Continuous Infusions:   LOS: 0 days     Geradine Girt, DO [facility]  If 7PM-7AM, please contact night-coverage [email or web address] 06/19/2019, 11:45 AM

## 2019-06-19 NOTE — Discharge Instructions (Signed)

## 2019-06-19 NOTE — Plan of Care (Signed)
Patient currently sitting up in bed eating breakfast without assistance.

## 2019-06-19 NOTE — Telephone Encounter (Signed)
Last OV 05/06/19 Next OV 11/04/2019 Last RF 05/27/19

## 2019-06-19 NOTE — Progress Notes (Signed)
Asleep at this time.  Did not do a midnight assessment, as she finally fell asleep after being anxious, belligerent, and combative and trying to get out of bed and leave, as she thought she was at her own home and being held captive.  Was yelling and hitting at this staff.  Did place a tele sitter for safety but is asleep at this time and shows no signs of distress.  Her resp are even and unlabored.  She is lying on her right side resting at this time.

## 2019-06-19 NOTE — Progress Notes (Signed)
Physical Therapy Treatment Patient Details Name: [name] MRN: [id number] DOB: 04/20/1930 Today's Date: 06/19/2019    History of Present Illness [name] is a 85 y.o. female with medical history significant of hypertension, paroxysmal A. Fib-on Eliquis, arthritis presents to emergency department due to loss of consciousness and fall x2. PMH positive for A-fib (not on AC due to UGIB), HTN, macular degeneration, R hip fx 9/19.    PT Comments    Patient seen for mobility progression. Pt requires supervision for transfers and min guard/min A for gait distance of 200 ft with no AD. Pt with A fib and HR up to 140s while mobilizing and asymptomatic. Continue to progress as tolerated.     Follow Up Recommendations  No PT follow up     Equipment Recommendations  None recommended by PT    Recommendations for Other Services       Precautions / Restrictions Precautions Precautions: Fall Restrictions Weight Bearing Restrictions: No    Mobility  Bed Mobility               General bed mobility comments: pt OOB in chair upon arrival  Transfers Overall transfer level: Needs assistance Equipment used: None Transfers: Sit to/from Stand Sit to Stand: Supervision         General transfer comment: for safety  Ambulation/Gait Ambulation/Gait assistance: Min guard;Min assist Gait Distance (Feet): 200 Feet Assistive device: None Gait Pattern/deviations: Step-through pattern;Decreased stride length;Decreased step length - right;Decreased stance time - right;Trunk flexed Gait velocity: decreased   General Gait Details: pt with flexed posture and decreased bilat step height; assist to steady with horizontal head turns due to increased unsteadiness   Stairs Stairs: Yes Stairs assistance: Min guard Stair Management: Two rails;Alternating pattern;Forwards Number of Stairs: 2 General stair comments: min guard for safety; pt reports single step to enter  home   Wheelchair Mobility    Modified Rankin (Stroke Patients Only)       Balance Overall balance assessment: Needs assistance Sitting-balance support: No upper extremity supported Sitting balance-Leahy Scale: Good     Standing balance support: No upper extremity supported Standing balance-Leahy Scale: Fair                              Cognition Arousal/Alertness: Awake/alert Behavior During Therapy: WFL for tasks assessed/performed Overall Cognitive Status: History of cognitive impairments - at baseline                                 General Comments: HOH      Exercises      General Comments        Pertinent Vitals/Pain Pain Assessment: Faces Faces Pain Scale: Hurts a little bit Pain Location: buttocks Pain Descriptors / Indicators: Sore Pain Intervention(s): Limited activity within patient's tolerance;Monitored during session;Repositioned    Home Living                      Prior Function            PT Goals (current goals can now be found in the care plan section) Acute Rehab PT Goals Patient Stated Goal: to go home Progress towards PT goals: Progressing toward goals    Frequency    Min 3X/week      PT Plan Current plan remains appropriate    Co-evaluation  AM-PAC PT "6 Clicks" Mobility   Outcome Measure  Help needed turning from your back to your side while in a flat bed without using bedrails?: None Help needed moving from lying on your back to sitting on the side of a flat bed without using bedrails?: A Little Help needed moving to and from a bed to a chair (including a wheelchair)?: A Little Help needed standing up from a chair using your arms (e.g., wheelchair or bedside chair)?: None Help needed to walk in hospital room?: A Little Help needed climbing 3-5 steps with a railing? : A Little 6 Click Score: 20    End of Session Equipment Utilized During Treatment: Gait  belt Activity Tolerance: Patient tolerated treatment well Patient left: in chair;with call bell/phone within reach;with chair alarm set Nurse Communication: Mobility status PT Visit Diagnosis: Other abnormalities of gait and mobility (R26.89)     Time: [id number] PT Time Calculation (min) (ACUTE ONLY): 18 min  Charges:  $Gait Training: 8-22 mins                     Earney Navy, PTA Acute Rehabilitation Services Pager: [phone] Office: [phone]     Darliss Cheney 06/19/2019, 4:31 PM

## 2019-06-20 DIAGNOSIS — R55 Syncope and collapse: Principal | ICD-10-CM

## 2019-06-20 DIAGNOSIS — E871 Hypo-osmolality and hyponatremia: Secondary | ICD-10-CM

## 2019-06-20 DIAGNOSIS — I480 Paroxysmal atrial fibrillation: Secondary | ICD-10-CM

## 2019-06-20 DIAGNOSIS — E872 Acidosis, unspecified: Secondary | ICD-10-CM

## 2019-06-20 LAB — COMPREHENSIVE METABOLIC PANEL
ALT: 18 U/L (ref 0–44)
AST: 19 U/L (ref 15–41)
Albumin: 3.2 g/dL — ABNORMAL LOW (ref 3.5–5.0)
Alkaline Phosphatase: 85 U/L (ref 38–126)
Anion gap: 10 (ref 5–15)
BUN: 16 mg/dL (ref 8–23)
CO2: 20 mmol/L — ABNORMAL LOW (ref 22–32)
Calcium: 8.7 mg/dL — ABNORMAL LOW (ref 8.9–10.3)
Chloride: 104 mmol/L (ref 98–111)
Creatinine, Ser: 0.74 mg/dL (ref 0.44–1.00)
GFR calc Af Amer: >60 mL/min (ref >60)
GFR calc non Af Amer: >60 mL/min (ref >60)
Glucose, Bld: 157 mg/dL — ABNORMAL HIGH (ref 70–99)
Potassium: 4.3 mmol/L (ref 3.5–5.1)
Sodium: 134 mmol/L — ABNORMAL LOW (ref 135–145)
Total Bilirubin: 0.7 mg/dL (ref 0.3–1.2)
Total Protein: 6.3 g/dL — ABNORMAL LOW (ref 6.5–8.1)

## 2019-06-20 LAB — CBC WITH DIFFERENTIAL/PLATELET
Abs Immature Granulocytes: 0.04 10*3/uL (ref 0.00–0.07)
Basophils Absolute: 0 10*3/uL (ref 0.0–0.1)
Basophils Relative: 1 %
Eosinophils Absolute: 0.3 10*3/uL (ref 0.0–0.5)
Eosinophils Relative: 4 %
HCT: 38.7 % (ref 36.0–46.0)
Hemoglobin: 12.4 g/dL (ref 12.0–15.0)
Immature Granulocytes: 1 %
Lymphocytes Relative: 23 %
Lymphs Abs: 1.8 10*3/uL (ref 0.7–4.0)
MCH: 30.8 pg (ref 26.0–34.0)
MCHC: 32 g/dL (ref 30.0–36.0)
MCV: 96 fL (ref 80.0–100.0)
Monocytes Absolute: 1 10*3/uL (ref 0.1–1.0)
Monocytes Relative: 13 %
Neutro Abs: 4.6 10*3/uL (ref 1.7–7.7)
Neutrophils Relative %: 58 %
Platelets: 256 10*3/uL (ref 150–400)
RBC: 4.03 MIL/uL (ref 3.87–5.11)
RDW: 13.4 % (ref 11.5–15.5)
WBC: 7.8 10*3/uL (ref 4.0–10.5)
nRBC: 0 % (ref 0.0–0.2)

## 2019-06-20 LAB — PHOSPHORUS: Phosphorus: 2.4 mg/dL — ABNORMAL LOW (ref 2.5–4.6)

## 2019-06-20 LAB — MAGNESIUM: Magnesium: 2.1 mg/dL (ref 1.7–2.4)

## 2019-06-20 MED ORDER — METOPROLOL TARTRATE 50 MG PO TABS
50.0000 mg | ORAL_TABLET | Freq: Two times a day (BID) | ORAL | Status: DC
  Administered 2019-06-20 – 2019-06-21 (×2): 50 mg via ORAL
  Filled 2019-06-20 (×2): qty 1

## 2019-06-20 MED ORDER — SODIUM CHLORIDE 0.9 % IV SOLN: INTRAVENOUS | Status: AC

## 2019-06-20 MED ORDER — SODIUM PHOSPHATES 45 MMOLE/15ML IV SOLN
10.0000 mmol | Freq: Once | INTRAVENOUS | Status: AC
  Administered 2019-06-20: 10 mmol via INTRAVENOUS
  Filled 2019-06-20: qty 3.33

## 2019-06-20 NOTE — Progress Notes (Signed)
PROGRESS NOTE    [name]  [id number] DOB: 21-Sep-1929 DOA: 06/17/2019 PCP: Binnie Rail, MD   Brief Narrative:  HPI per Dr. Early Osmond on 06/17/2019 [name] is a 85 y.o. female with medical history significant of hypertension, paroxysmal A. Fib-on Eliquis, arthritis presents to emergency department due to loss of consciousness and fall x2.  Patient's son who is at the bedside reports that patient was doing well this morning, was watching TV on the couch and suddenly she fell from the couch and her eyes rolled up, no seizure or  frothing from mouth or tongue biting reported.  This episode lasted for few seconds.  Patient back to her normal mentation within few seconds.  Son checked her blood pressure which was within normal limit, pulse was irregularly irregular.  The second episode happened when she was in the kitchen at 1230 and she fell suddenly on the ground and hit on her head-son found her unresponsive.  This episode also lasted for few seconds.  Son called EMS and brought patient to the emergency department for further evaluation and management.    No history of headache, blurry vision, dizziness, lightheadedness prior or after the episode.  Patient denies cough, congestion, fever, chills, chest pain, shortness of breath, palpitation, leg swelling, nausea, vomiting, diarrhea, decreased appetite, urinary or bowel changes.    Her son who is also a power of attorney lives with her, no history of smoking, alcohol, illicit drug use.    ED Course: Upon arrival to ED, patient's vital signs stable, PT/INR: WNL, initial labs such as CBC, CMP: WNL, COVID-19 pending, CT head and cervical spine came back negative for acute findings.  EDP consulted neurology who recommended syncope work-up.  Neurology signed off.  **Interim History  She has had periods of anxiousness where her heart rate shoots up into the 150s and yesterday she became very anxious and heart rate was greater  than 150.  Today she also had heart rate in the 160s so cardiology was consulted for further evaluation recommendations.  The night before last she became combative but this morning was pleasant.  Will anticipate discharge in the next 24 to 48 hours pending cardiology evaluation and recommendations  Assessment & Plan:   Principal Problem:   Syncope Active Problems:   Essential hypertension   Paroxysmal atrial fibrillation (HCC)   Atrial fibrillation with RVR (HCC)   DNR (do not resuscitate)  Syncope -Unknown etiology but could be from Orthostasis? But Orthostatic Vital Signs unremarkable -? A fib-- has parox a fib and now with RVR and has had Uncontrolled HR's -EEG showed "This study is suggestive of non specific cortical dysfunction in left temporal region.  No seizures or epileptiform discharges were seen throughout the recording." -Carotid Doppler done and showed Stenosis of 1-39% bilaterally  -Orthostatic vitals unremarkable -TSH unremarkable -UA unremarkable except for Pyuria  -Chest x-ray pending -CT head and cervical spine came back negative for acute findings -Echo showed EF of 50 to 55% -EDP consulted Neurology who recommended syncope work-up,neurology signed off -Will consult Cardiology given Uncontrolled HR -Physical therapy: No Follow up Initially but will now require Home Health PT  Paroxysmal A. Fib- now with RVR -Increased Metoprolol to 37.5 mg po BID -ECHOCardiogram showed "Left ventricular ejection fraction, by visual estimation, is 50 to 55%. The left ventricle has low normal function. The left ventricular internal cavity size was the left ventricle is normal in size. There is no left ventricular  Hypertrophy." -TSH was  1.497 -HS-Troponin were flat  -C/w Apixaban 2.5 mg po BID  -C/w Telemetry Monitoring -HR's have been uncontrolled and Tahcycardic so will consult Cardiology for further Evaluation and Recc's  Hypertension -Stable -D/C'd Amlodipine so HR  controlling medications can be increased as Metoprolol is now 37.5 mg BID -Continue with Labetalol 10 mg IV every 4 as needed for high blood pressure  History of T12 compression fracture -Status post kyphoplasty -Continue Acetaminophen 650 mg po q6h as needed for pain  Metabolic Acidosis -Mild  -Patient CO2 is now 20, chloride is 104, and anion gap is 10 -Continue to monitor and trend -Repeat CMP in a.m.  Hyponatremia -Mild with a sodium of 134  -Replete with IV sodium phosphate 10 mmol -Continue to monitor and trend and repeat CMP in a.m.  Hypophosphatemia -Patient's phosphorus level this morning was 2.4 -Replete with IV sodium Phos 10 mmol -Continue to monitor and replete as necessary -Repeat phosphorus level in a.m.  GOC: DNR, poA  DVT prophylaxis: Anticoagulated with Apixaban  Code Status: DO NOT RESUSCITATE; poA Family Communication: No family present at bedside  Disposition Plan: Home with Home Health PT when medically stable and ready for D/C   Consultants:   Cardiology    Procedures:   ECHOCARDIOGRAM IMPRESSIONS    1. Left ventricular ejection fraction, by visual estimation, is 50 to 55%. The left ventricle has low normal function. Left ventricular septal wall thickness was mildly increased. There is no left ventricular hypertrophy.  2. Global right ventricle has mildly reduced systolic function.The right ventricular size is mildly enlarged. No increase in right ventricular wall thickness.  3. Left atrial size was normal.  4. Right atrial size was moderately dilated.  5. The pericardial effusion is lateral to the left ventricle.  6. Trivial pericardial effusion is present.  7. Moderate calcification of the mitral valve leaflet(s).  8. Moderate mitral annular calcification.  9. Moderate thickening of the mitral valve leaflet(s). 10. The mitral valve is degenerative. Mild mitral valve regurgitation. 11. The tricuspid valve is normal in structure. Tricuspid  valve regurgitation moderate. 12. The aortic valve is tricuspid. Aortic valve regurgitation is mild. sclerosis without stenosis. 13. There is Moderate calcification of the aortic valve. 14. There is Moderate thickening of the aortic valve. 15. The pulmonic valve was not well visualized. Pulmonic valve regurgitation is mild. 16. Shadowing artifact normal diameter. 17. Normal pulmonary artery systolic pressure.  FINDINGS  Left Ventricle: Left ventricular ejection fraction, by visual estimation, is 50 to 55%. The left ventricle has low normal function. The left ventricular internal cavity size was the left ventricle is normal in size. There is no left ventricular  hypertrophy.  Right Ventricle: The right ventricular size is mildly enlarged. No increase in right ventricular wall thickness. Global RV systolic function is has mildly reduced systolic function. The tricuspid regurgitant velocity is 2.39 m/s, and with an assumed  right atrial pressure of 3 mmHg, the estimated right ventricular systolic pressure is normal at 25.9 mmHg.  Left Atrium: Left atrial size was normal in size.  Right Atrium: Right atrial size was moderately dilated  Pericardium: Trivial pericardial effusion is present. The pericardial effusion is lateral to the left ventricle.  Mitral Valve: The mitral valve is degenerative in appearance. There is moderate thickening of the mitral valve leaflet(s). There is moderate calcification of the mitral valve leaflet(s). Moderate mitral annular calcification. Mild mitral valve  regurgitation.  Tricuspid Valve: The tricuspid valve is normal in structure. Tricuspid valve regurgitation moderate.  Aortic  Valve: The aortic valve is tricuspid. . There is Moderate thickening and Moderate calcification of the aortic valve. Aortic valve regurgitation is mild. Sclerosis without stenosis. There is Moderate thickening of the aortic valve. Moderate  calcification.  Pulmonic Valve: The  pulmonic valve was not well visualized. Pulmonic valve regurgitation is mild.  Aorta: The aortic root is normal in size and structure. Shadowing artifact normal diameter.  IAS/Shunts: No atrial level shunt detected by color flow Doppler.     LEFT VENTRICLE PLAX 2D LVIDd:         3.70 cm  Diastology LVIDs:         2.70 cm  LV e' lateral:   6.82 cm/s LV PW:         1.40 cm  LV E/e' lateral: 9.2 LV IVS:        1.30 cm  LV e' medial:    6.24 cm/s LVOT diam:     1.70 cm  LV E/e' medial:  10.1 LV SV:         31 ml LV SV Index:   21.67 LVOT Area:     2.27 cm    RIGHT VENTRICLE RV S prime:     13.80 cm/s  LEFT ATRIUM           Index       RIGHT ATRIUM           Index LA diam:      2.70 cm 1.84 cm/m  RA Area:     21.30 cm LA Vol (A4C): 29.9 ml 20.35 ml/m RA Volume:   69.30 ml  47.16 ml/m  AORTIC VALVE LVOT Vmax:   40.50 cm/s LVOT Vmean:  31.700 cm/s LVOT VTI:    0.081 m   AORTA Ao Root diam: 3.30 cm Ao Asc diam:  3.20 cm  MITRAL VALVE                       TRICUSPID VALVE MV Area (PHT): 5.62 cm            TR Peak grad:   22.9 mmHg MV PHT:        39.15 msec          TR Vmax:        257.00 cm/s MV Decel Time: 135 msec MV E velocity: 62.70 cm/s 103 cm/s SHUNTS                                    Systemic VTI:  0.08 m                                    Systemic Diam: 1.70 cm  EEG on 06/18/2019   Technical aspects: This EEG study was done with scalp electrodes positioned according to the 10-20 International system of electrode placement. Electrical activity was acquired at a sampling rate of 500Hz  and reviewed with a high frequency filter of 70Hz  and a low frequency filter of 1Hz . EEG data were recorded continuously and digitally stored.   DESCRIPTION: No clear posterior dominant rhythm was seen. EEG showed intermittent 2-3hz  delta slowing in left temporal region. Hyperventilation and photic stimulation were not performed.  ABNORMALITY - Intermittent slow, left  temporal  IMPRESSION: This study is suggestive of non specific cortical dysfunction in left temporal region.  No seizures  or epileptiform discharges were seen throughout the recording.  CAROTID DOPPLERS Summary: Right Carotid: Velocities in the right ICA are consistent with a 1-39% stenosis.  Left Carotid: Velocities in the left ICA are consistent with a 1-39% stenosis.  Vertebrals:  Bilateral vertebral arteries demonstrate antegrade flow. Subclavians: Normal flow hemodynamics were seen in bilateral subclavian              arteries.   Antimicrobials: Anti-infectives (From admission, onward)   None     Subjective: Seen and examined at bedside she is sitting in chair appeared calm and denied any chest pain, shortness breath nausea or vomiting.  Had 2 episodes where she passed out but no further episodes while hospitalized.  Cardiology has been consulted given her uncontrolled heart rates and tachycardia.  No other concerns or complaints at this time.  Objective: Vitals:   06/20/19 1044 06/20/19 1051 06/20/19 1152 06/20/19 1245  BP: 120/80  (!) 113/95   Pulse:  (!) 140 (!) 127 98  Resp: 20     Temp:      TempSrc:      SpO2:    98%   No intake or output data in the 24 hours ending 06/20/19 1621 There were no vitals filed for this visit.  Examination: Physical Exam:  Constitutional: Thin Caucasian female currently in no acute distress appears mildly anxious Eyes: Lids and conjunctivae normal, sclerae anicteric  ENMT: External Ears, Nose appear normal. Grossly normal hearing.  Neck: Appears normal, supple, no cervical masses, normal ROM, no appreciable thyromegaly; no JVD Respiratory: Diminished to auscultation bilaterally, no wheezing, rales, rhonchi or crackles. Normal respiratory effort and patient is not tachypenic. No accessory muscle use.  Cardiovascular: Irregularly irregular and tachycardic rate, no murmurs / rubs / gallops. S1 and S2 auscultated. No extremity edema.   Abdomen: Soft, non-tender, non-distended.  Bowel sounds positive x4.  GU: Deferred. Musculoskeletal: No clubbing / cyanosis of digits/nails. No joint deformity upper and lower extremities.  Skin: No rashes, lesions, ulcers on a limited skin evaluation. No induration; Warm and dry.  Neurologic: CN 2-12 grossly intact with no focal deficits. Romberg sign and cerebellar reflexes not assessed.  Psychiatric: Normal judgment and insight. Alert and oriented x 3. Slightly Anxious mood and appropriate affect.   Data Reviewed: I have personally reviewed following labs and imaging studies  CBC: Recent Labs  Lab 06/17/19 1344 06/17/19 1346 06/18/19 0323 06/19/19 0306 06/20/19 0827  WBC  --  7.8 8.7 8.3 7.8  NEUTROABS  --  5.9  --  5.7 4.6  HGB 13.9 13.1 12.5 12.1 12.4  HCT 41.0 40.6 38.5 37.6 38.7  MCV  --  96.0 94.8 95.4 96.0  PLT  --  262 247 238 [id number]   Basic Metabolic Panel: Recent Labs  Lab 06/17/19 1344 06/17/19 1346 06/17/19 1351 06/18/19 0323 06/19/19 0306 06/20/19 0827  NA 136 136  --  137 135 134*  K 4.1 4.2  --  3.4* 4.5 4.3  CL 100 103  --  102 104 104  CO2  --  25  --  23 22 20*  GLUCOSE 122* 124*  --  131* 123* 157*  BUN 21 20  --  13 17 16   CREATININE 0.70 0.78  --  0.75 0.76 0.74  CALCIUM  --  8.8*  --  8.5* 8.4* 8.7*  MG  --   --  2.2  --   --  2.1  PHOS  --   --  3.7  --   --  2.4*   GFR: CrCl cannot be calculated (Unknown ideal weight.). Liver Function Tests: Recent Labs  Lab 06/17/19 1346 06/18/19 0323 06/20/19 0827  AST 19 18 19   ALT 22 20 18   ALKPHOS 91 84 85  BILITOT 0.8 1.1 0.7  PROT 6.5 6.3* 6.3*  ALBUMIN 3.4* 3.3* 3.2*   No results for input(s): LIPASE, AMYLASE in the last 168 hours. No results for input(s): AMMONIA in the last 168 hours. Coagulation Profile: Recent Labs  Lab 06/17/19 1346  INR 1.0   Cardiac Enzymes: No results for input(s): CKTOTAL, CKMB, CKMBINDEX, TROPONINI in the last 168 hours. BNP (last 3 results) No results  for input(s): PROBNP in the last 8760 hours. HbA1C: No results for input(s): HGBA1C in the last 72 hours. CBG: Recent Labs  Lab 06/17/19 1340  GLUCAP 120*   Lipid Profile: No results for input(s): CHOL, HDL, LDLCALC, TRIG, CHOLHDL, LDLDIRECT in the last 72 hours. Thyroid Function Tests: Recent Labs    06/17/19 2000  TSH 1.497   Anemia Panel: No results for input(s): VITAMINB12, FOLATE, FERRITIN, TIBC, IRON, RETICCTPCT in the last 72 hours. Sepsis Labs: No results for input(s): PROCALCITON, LATICACIDVEN in the last 168 hours.  Recent Results (from the past 240 hour(s))  SARS CORONAVIRUS 2 (TAT 6-24 HRS) Nasopharyngeal Nasopharyngeal Swab     Status: None   Collection Time: 06/17/19  4:47 PM   Specimen: Nasopharyngeal Swab  Result Value Ref Range Status   SARS Coronavirus 2 NEGATIVE NEGATIVE Final    Comment: (NOTE) SARS-CoV-2 target nucleic acids are NOT DETECTED. The SARS-CoV-2 RNA is generally detectable in upper and lower respiratory specimens during the acute phase of infection. Negative results do not preclude SARS-CoV-2 infection, do not rule out co-infections with other pathogens, and should not be used as the sole basis for treatment or other patient management decisions. Negative results must be combined with clinical observations, patient history, and epidemiological information. The expected result is Negative. Fact Sheet for Patients: SugarRoll.be Fact Sheet for Healthcare Providers: https://www.woods-mathews.com/ This test is not yet approved or cleared by the [place] FDA and  has been authorized for detection and/or diagnosis of SARS-CoV-2 by FDA under an Emergency Use Authorization (EUA). This EUA will remain  in effect (meaning this test can be used) for the duration of the COVID-19 declaration under Section 56 4(b)(1) of the Act, 21 U.S.C. section 360bbb-3(b)(1), unless the authorization is terminated or  revoked sooner. Performed at [facility], [place] [address], [place], [place] [address]    Radiology Studies: No results found.  Scheduled Meds: . apixaban  2.5 mg Oral BID  . metoprolol tartrate  37.5 mg Oral BID   Continuous Infusions:   LOS: 1 day   Kerney Elbe, DO [facility] PAGER is on AMION  If 7PM-7AM, please contact night-coverage [email or web address]

## 2019-06-20 NOTE — Progress Notes (Signed)
Physical Therapy Treatment Patient Details Name: [name] MRN: [id number] DOB: 11-Feb-1930 Today's Date: 06/20/2019    History of Present Illness [name] is a 85 y.o. female with medical history significant of hypertension, paroxysmal A. Fib-on Eliquis, arthritis presents to emergency department due to loss of consciousness and fall x2. PMH positive for A-fib (not on AC due to UGIB), HTN, macular degeneration, R hip fx 9/19.    PT Comments    Patient received in recliner, son present. Patient with confusion. Performed sit to stand with supervision, ambulated 250 feet with no AD min guard, went to bathroom and came out independently. She will continue to benefit from PT while here to prevent weakness and to be able to return home independently.      Follow Up Recommendations  Home health PT     Equipment Recommendations  None recommended by PT    Recommendations for Other Services       Precautions / Restrictions Precautions Precautions: Fall Restrictions Weight Bearing Restrictions: No    Mobility  Bed Mobility               General bed mobility comments: pt OOB in chair upon arrival  Transfers Overall transfer level: Needs assistance Equipment used: None Transfers: Sit to/from Stand Sit to Stand: Supervision         General transfer comment: for safety  Ambulation/Gait Ambulation/Gait assistance: Min guard Gait Distance (Feet): 250 Feet Assistive device: None Gait Pattern/deviations: Step-through pattern;Decreased stride length Gait velocity: decreased       Stairs             Wheelchair Mobility    Modified Rankin (Stroke Patients Only)       Balance Overall balance assessment: Needs assistance Sitting-balance support: Feet supported Sitting balance-Leahy Scale: Good     Standing balance support: During functional activity;No upper extremity supported Standing balance-Leahy Scale: Good Standing balance comment: went  into restroom supervision for stand to sit. Independent sit to stand from low commode. Washed hands at sink independently                            Cognition Arousal/Alertness: Awake/alert Behavior During Therapy: WFL for tasks assessed/performed Overall Cognitive Status: History of cognitive impairments - at baseline                                 General Comments: HOH      Exercises      General Comments        Pertinent Vitals/Pain Pain Assessment: No/denies pain    Home Living                      Prior Function            PT Goals (current goals can now be found in the care plan section) Acute Rehab PT Goals Patient Stated Goal: to go home PT Goal Formulation: With patient/family Time For Goal Achievement: 06/25/19 Potential to Achieve Goals: Good Progress towards PT goals: Progressing toward goals    Frequency    Min 3X/week      PT Plan Current plan remains appropriate    Co-evaluation              AM-PAC PT "6 Clicks" Mobility   Outcome Measure  Help needed turning from your back to your side while  in a flat bed without using bedrails?: None Help needed moving from lying on your back to sitting on the side of a flat bed without using bedrails?: A Little Help needed moving to and from a bed to a chair (including a wheelchair)?: A Little Help needed standing up from a chair using your arms (e.g., wheelchair or bedside chair)?: A Little Help needed to walk in hospital room?: A Little Help needed climbing 3-5 steps with a railing? : A Little 6 Click Score: 19    End of Session Equipment Utilized During Treatment: Gait belt Activity Tolerance: Patient tolerated treatment well Patient left: in chair;with chair alarm set;with call bell/phone within reach;with family/visitor present Nurse Communication: Mobility status PT Visit Diagnosis: History of falling (Z91.81)     Time: [id number] PT Time Calculation  (min) (ACUTE ONLY): 20 min  Charges:  $Gait Training: 8-22 mins                     Ronda Rajkumar, PT, GCS 06/20/19,12:54 PM

## 2019-06-20 NOTE — Consult Note (Signed)
Cardiology Consultation:   Patient ID: [name]; [id number]; 1930/01/08   Admit date: 06/17/2019 Date of Consult: 06/20/2019  Primary Care Provider: Binnie Rail, MD Primary Cardiologist: New to [place]; Dr. Audie Box Primary Electrophysiologist:  None    Patient Profile:   [name] is a 85 y.o. female with a PMH of paroxysmal atrial fibrillaion on eliquis, HTN, arthritis, and dementia, who is being seen today for the evaluation of syncope and atrial fibrillation at the request of Dr. Alfredia Ferguson.  History of Present Illness:   [name] was in her usual state of health until 06/17/2019 when she experienced a syncopal event. Her son reported that she was sitting on the couch when she suddenly fell to the floor and her eyes rolled up. There was no seizure like activity noted. She regained consciousness within a few seconds and returned to normal mentation. Her blood pressure was reportedly fine at that time, however her heart rate was irregular. She then had a subsequent episode around 12:30 that afternoon, however this was a sudden LOC from standing with fall to the ground and head strike. Again the episode lasted for a few seconds. EMS was activated and patient was brought to the ED for further evaluation. Neurology was consulted. CT Head was without acute findings. She was recommended for ongoing syncope work-up. EEG was negative. Orthostatics were negative. No infectious source. Carotid dopplers revealed mild stenosis. Echo revealed EF 50-55%, mild MR, and moderate TR. Her hospital course has been complicated by paroxysmal atrial fibrillation with RVR with rates up to 160s yesterday and again this morning. Cardiology was asked to evaluate for atrial fibrillation with RVR and syncope.   At the time of this evaluation, she reports feeling well. She does not recall the events prompting her hospitalization but states that her son brought her in after she fell at home. She does not  recall any chest pain, dizziness, lightheadedness, or palpitations. Prior to this she was fairly active. [facility], however now does some weight lifting at home. She has no anginal complaints with vacuuming which is the most strenuous activity she does now. She is historically unaware of her atrial fibrillation and has no appreciated any palpitations or racing heart beats this admission or prior to. No complaints of fever, URI symptoms, hematuria, hematochezia, or melena.   Past Medical History:  Diagnosis Date  . Anemia   . Arthritis    "back" (01/16/2018)  . Atrial fibrillation ([place])   . Cancer [facility])    low grade papillary, most recent recurrence 2011,  Dr Karsten Ro  . 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 History of blood transfusion 01/15/2018   S/P OR  . Hypertension   . Hyponatremia 01/26/12   Na+ 131  . Macular degeneration, bilateral    "were wet; dry now" (01/16/2018)  . Other abnormal glucose 06/14/12   Fasting blood glucose 128; A1c 5.8% on 12/15/11    Past Surgical History:  Procedure Laterality Date  . ABDOMINAL HYSTERECTOMY  1970s   with oopherectomy for incidenctal cyst (no PMH of abnormal PAP)  . APPENDECTOMY     1970s  . BLADDER TUMOR EXCISION  2011 & 2013   Dr Karsten Ro  . CATARACT EXTRACTION, BILATERAL  2014   Dr Herbert Deaner  . COLONOSCOPY     negative; Dr Olevia Perches  . CYSTOSCOPY      multiple since tumor excision;Dr Ottelin  .  DILATION AND CURETTAGE OF UTERUS    . FRACTURE SURGERY    . INTRAMEDULLARY (IM) NAIL INTERTROCHANTERIC Right 01/15/2018   Procedure: INTRAMEDULLARY (IM) NAIL INTERTROCHANTRIC;  Surgeon: Melrose Nakayama, MD;  Location: [facility];  Service: Orthopedics;  Laterality: Right;  . KYPHOPLASTY N/A 03/08/2018   Procedure: THORACIC 12 KYPHOPLASTY;  Surgeon: Phylliss Bob, MD;  Location: [facility];  Service: Orthopedics;  Laterality: N/A;  . TONSILLECTOMY       Home Medications:  Prior to Admission  medications   Medication Sig Start Date End Date Taking? Authorizing Provider  acetaminophen (TYLENOL) 500 MG tablet Take 500 mg by mouth daily as needed for moderate pain.    Yes [provider]  amLODipine (NORVASC) 5 MG tablet TAKE 1 TABLET BY MOUTH EVERY DAY FOR BLOOD PRESSURE Patient taking differently: Take 5 mg by mouth daily. For blood pressure 05/27/19  Yes Burns, Claudina Lick, MD  Cholecalciferol (VITAMIN D3 SUPER STRENGTH) 50 MCG (2000 UT) TABS Take 2,000 Units by mouth daily with lunch.   Yes [provider]  ELIQUIS 2.5 MG TABS tablet TAKE 1 TABLET BY MOUTH TWICE A DAY Patient taking differently: Take 2.5 mg by mouth 2 (two) times daily.  04/05/19  Yes Burns, Claudina Lick, MD  Magnesium 400 MG TABS Take 400 mg by mouth daily with lunch.   Yes [provider]  metoprolol tartrate (LOPRESSOR) 25 MG tablet TAKE 1 TABLET BY MOUTH TWICE A DAY Patient taking differently: Take 25 mg by mouth 2 (two) times daily.  04/22/19  Yes Burns, Claudina Lick, MD  multivitamin-lutein [facility]) CAPS capsule Take 1 capsule by mouth daily with lunch.    Yes [provider]  Omega-3 Fatty Acids (FISH OIL) 1000 MG CAPS Take 1,000 mg by mouth daily with lunch.    Yes [provider]  tretinoin (RETIN-A) 0.05 % cream Apply 1 application topically daily.    Yes [provider]  calcium carbonate (OS-CAL) 600 MG TABS tablet Take 600 mg by mouth 2 (two) times daily.     [provider]  traMADol (ULTRAM) 50 MG tablet TAKE 1 TABLET BY MOUTH EVERY 8 HOURS AS NEEDED FOR MODERATE PAIN. 06/20/19   Binnie Rail, MD    Inpatient Medications: Scheduled Meds: . apixaban  2.5 mg Oral BID  . metoprolol tartrate  37.5 mg Oral BID   Continuous Infusions:  PRN Meds: acetaminophen **OR** acetaminophen, labetalol, ondansetron **OR** ondansetron (ZOFRAN) IV  Allergies:   No Known Allergies  Social History:   Social History   Socioeconomic History  . Marital  status: Widowed    Spouse name: Not on file  . Number of children: 2  . Years of education: Not on file  . Highest education level: Not on file  Occupational History  . Occupation: Retired  Scientific laboratory technician  . Financial resource strain: Not hard at all  . Food insecurity    Worry: Never true    Inability: Never true  . Transportation needs    Medical: No    Non-medical: No  Tobacco Use  . Smoking status: Never Smoker  . Smokeless tobacco: Never Used  Substance and Sexual Activity  . Alcohol use: Never    Alcohol/week: 0.0 standard drinks    Frequency: Never  . Drug use: Never  . Sexual activity: Never  Lifestyle  . Physical activity    Days per week: 3 days    Minutes per session: 30 min  . Stress: Not at all  Relationships  .  Social connections    Talks on phone: More than three times a week    Gets together: More than three times a week    Attends religious service: More than 4 times per year    Active member of club or organization: Yes    Attends meetings of clubs or organizations: More than 4 times per year    Relationship status: Widowed  . Intimate partner violence    Fear of current or ex partner: Not on file    Emotionally abused: Not on file    Physically abused: Not on file    Forced sexual activity: Not on file  Other Topics Concern  . Not on file  Social History Narrative  . Not on file    Family History:    Family History  Problem Relation Age of Onset  . Stroke Mother 77  . Heart attack Father 39  . Coronary artery disease Brother        S/P CBAG ? @ 60  . Diabetes Brother   . Kidney disease Neg Hx   . Cancer Neg Hx   . Colon cancer Neg Hx   . Colon polyps Neg Hx      ROS:  Please see the history of present illness.  ROS  All other ROS reviewed and negative.     Physical Exam/Data:   Vitals:   06/20/19 0833 06/20/19 1044 06/20/19 1051 06/20/19 1152  BP: (!) 97/55 120/80  (!) 113/95  Pulse: (!) 159  (!) 140 (!) 127  Resp: 20 20     Temp: (!) 97.5 F (36.4 C)     TempSrc: Oral     SpO2: 99%      No intake or output data in the 24 hours ending 06/20/19 1241 There were no vitals filed for this visit. There is no height or weight on file to calculate BMI.  General:  Thin appearing elderly female sitting in the bedside chair in no acute distress HEENT: sclera anicteric  Neck: no JVD Vascular: No carotid bruits; distal pulses 2+ bilaterally Cardiac:  normal S1, S2; RRR; no murmurs, rubs, or gallops Lungs:  clear to auscultation bilaterally, no wheezing, rhonchi or rales  Abd: NABS, soft, nontender, no hepatomegaly Ext: no edema Musculoskeletal:  No deformities, BUE and BLE strength normal and equal Skin: warm and dry  Neuro:  CNs 2-12 intact, no focal abnormalities noted Psych:  Normal affect   EKG:  The EKG was personally reviewed and demonstrates:  Atrial fibrillation with RVR, rate 108, no STE/D, no TWI; prior EKG with NSR 03/2018.  Telemetry:  Telemetry was personally reviewed and demonstrates:  Atrial fibrillation with rates primarily in the 90s, occasional spikes to 160s.   Relevant CV Studies: Echocardiogram 06/18/2019: 1. Left ventricular ejection fraction, by visual estimation, is 50 to 55%. The left ventricle has low normal function. Left ventricular septal wall thickness was mildly increased. There is no left ventricular hypertrophy.  2. Global right ventricle has mildly reduced systolic function.The right ventricular size is mildly enlarged. No increase in right ventricular wall thickness.  3. Left atrial size was normal.  4. Right atrial size was moderately dilated.  5. The pericardial effusion is lateral to the left ventricle.  6. Trivial pericardial effusion is present.  7. Moderate calcification of the mitral valve leaflet(s).  8. Moderate mitral annular calcification.  9. Moderate thickening of the mitral valve leaflet(s). 10. The mitral valve is degenerative. Mild mitral valve regurgitation. 11.  The tricuspid  valve is normal in structure. Tricuspid valve regurgitation moderate. 12. The aortic valve is tricuspid. Aortic valve regurgitation is mild. sclerosis without stenosis. 13. There is Moderate calcification of the aortic valve. 14. There is Moderate thickening of the aortic valve. 15. The pulmonic valve was not well visualized. Pulmonic valve regurgitation is mild. 16. Shadowing artifact normal diameter. 17. Normal pulmonary artery systolic pressure.  Laboratory Data:  Chemistry Recent Labs  Lab 06/18/19 0323 06/19/19 0306 06/20/19 0827  NA 137 135 134*  K 3.4* 4.5 4.3  CL 102 104 104  CO2 23 22 20*  GLUCOSE 131* 123* 157*  BUN 13 17 16   CREATININE 0.75 0.76 0.74  CALCIUM 8.5* 8.4* 8.7*  GFRNONAA >60 >60 >60  GFRAA >60 >60 >60  ANIONGAP 12 9 10     Recent Labs  Lab 06/17/19 1346 06/18/19 0323 06/20/19 0827  PROT 6.5 6.3* 6.3*  ALBUMIN 3.4* 3.3* 3.2*  AST 19 18 19   ALT 22 20 18   ALKPHOS 91 84 85  BILITOT 0.8 1.1 0.7   Hematology Recent Labs  Lab 06/18/19 0323 06/19/19 0306 06/20/19 0827  WBC 8.7 8.3 7.8  RBC 4.06 3.94 4.03  HGB 12.5 12.1 12.4  HCT 38.5 37.6 38.7  MCV 94.8 95.4 96.0  MCH 30.8 30.7 30.8  MCHC 32.5 32.2 32.0  RDW 13.4 13.5 13.4  PLT 247 238 256   Cardiac EnzymesNo results for input(s): TROPONINI in the last 168 hours. No results for input(s): TROPIPOC in the last 168 hours.  BNPNo results for input(s): BNP, PROBNP in the last 168 hours.  DDimer No results for input(s): DDIMER in the last 168 hours.  Radiology/Studies:  Ct Cervical Spine Wo Contrast  Result Date: 06/17/2019 CLINICAL DATA:  Syncope EXAM: CT CERVICAL SPINE WITHOUT CONTRAST TECHNIQUE: Multidetector CT imaging of the cervical spine was performed without intravenous contrast. Multiplanar CT image reconstructions were also generated. COMPARISON:  None. FINDINGS: Alignment: Reversal of the cervical lordosis centered at C4. Mild anterolisthesis at C3-C4 and retrolisthesis  at C4-C5 and C5-C6. Skull base and vertebrae: No acute fracture. Vertebral body heights are maintained apart from degenerative endplate irregularity. Soft tissues and spinal canal: No prevertebral soft tissue swelling or fluid. No visible epidural collection or hematoma. Disc levels: There is multilevel disc space narrowing, endplate osteophytes and irregularity, and facet and uncovertebral hypertrophy. There is no high-grade canal stenosis at any level. Foraminal stenosis is greatest on the left at C3-C4 and on the right at C5-C6. Upper chest: Scarring at the lung apices. Other: None. IMPRESSION: No acute cervical spine fracture. Electronically Signed   By: Macy Mis M.D.   On: 06/17/2019 14:16   Dg Chest Port 1 View  Result Date: 06/18/2019 CLINICAL DATA:  Syncope. EXAM: PORTABLE CHEST 1 VIEW COMPARISON:  03/29/2018 FINDINGS: Heart size is normal considering the AP portable technique. Pulmonary vascularity is normal. Aortic atherosclerosis. Lungs are clear. No effusions. No acute bone abnormalities. Old right mid humerus fracture. IMPRESSION: 1. No active disease. 2.  Aortic Atherosclerosis (ICD10-I70.0). Electronically Signed   By: Lorriane Shire M.D.   On: 06/18/2019 14:13   Ct Head Code Stroke Wo Contrast  Result Date: 06/17/2019 CLINICAL DATA:  Code stroke. EXAM: CT HEAD WITHOUT CONTRAST TECHNIQUE: Contiguous axial images were obtained from the base of the skull through the vertex without intravenous contrast. COMPARISON:  None. FINDINGS: Brain: There is no acute intracranial hemorrhage, mass effect, or edema. Gray-white differentiation is preserved. There is no extra-axial fluid collection. Ventricles and sulci are  within normal limits in size and configuration. Patchy hypoattenuation in the supratentorial white matter is nonspecific but probably reflects mild to moderate chronic microvascular ischemic changes. A small focal area of low attenuation within the right basal ganglia likely reflects  an age-indeterminate small vessel infarct. Vascular: No hyperdense vessel. There is intracranial atherosclerotic calcification at the skull base. Skull: Unremarkable Sinuses/Orbits: Minor paranasal sinus mucosal thickening. No acute orbital abnormality. Other: Mastoid air cells are clear. ASPECTS [facility] Stroke Program Early CT Score) - Ganglionic level infarction (caudate, lentiform nuclei, internal capsule, insula, M1-M3 cortex): 7 - Supraganglionic infarction (M4-M6 cortex): 3 Total score (0-10 with 10 being normal): 10 IMPRESSION: 1. No acute intracranial hemorrhage or mass effect. Age indeterminate small vessel infarction of the right lentiform nucleus. 2. ASPECTS is 10. 3. Chronic microvascular ischemic changes. Electronically Signed   By: Macy Mis M.D.   On: 06/17/2019 14:06   Vas US Carotid  Result Date: 06/18/2019 Carotid Arterial Duplex Study Indications:       Syncope. Risk Factors:      Hypertension. Other Factors:     A-fib. Comparison Study:  No prior study Performing Technologist: Maudry Mayhew MHA, RDMS, RVT, RDCS  Examination Guidelines: A complete evaluation includes B-mode imaging, spectral Doppler, color Doppler, and power Doppler as needed of all accessible portions of each vessel. Bilateral testing is considered an integral part of a complete examination. Limited examinations for reoccurring indications may be performed as noted.  Right Carotid Findings: +----------+--------+--------+--------+------------------+------------------+           PSV cm/sEDV cm/sStenosisPlaque DescriptionComments           +----------+--------+--------+--------+------------------+------------------+ CCA Prox  67      11                                intimal thickening +----------+--------+--------+--------+------------------+------------------+ CCA Distal56      12                                                    +----------+--------+--------+--------+------------------+------------------+ ICA Prox  42      9                                 intimal thickening +----------+--------+--------+--------+------------------+------------------+ ICA Distal100     11                                                   +----------+--------+--------+--------+------------------+------------------+ ECA       92                                                           +----------+--------+--------+--------+------------------+------------------+ +----------+--------+-------+----------------+-------------------+           PSV cm/sEDV cmsDescribe        Arm Pressure (mmHG) +----------+--------+-------+----------------+-------------------+ [id number]            Multiphasic, WNL                    +----------+--------+-------+----------------+-------------------+ +---------+--------+--+--------+-+---------+  VertebralPSV cm/s26EDV cm/s8Antegrade +---------+--------+--+--------+-+---------+  Left Carotid Findings: +----------+--------+-------+--------+----------------------+------------------+           PSV cm/sEDV    StenosisPlaque Description    Comments                             cm/s                                                    +----------+--------+-------+--------+----------------------+------------------+ CCA Prox  59      10                                   intimal thickening +----------+--------+-------+--------+----------------------+------------------+ CCA Distal82      12             smooth and                                                                heterogenous                             +----------+--------+-------+--------+----------------------+------------------+ ICA Prox  45      11             smooth and                                                                heterogenous                              +----------+--------+-------+--------+----------------------+------------------+ ICA Distal69      13                                                      +----------+--------+-------+--------+----------------------+------------------+ ECA       78      5                                                       +----------+--------+-------+--------+----------------------+------------------+ +----------+--------+--------+----------------+-------------------+           PSV cm/sEDV cm/sDescribe        Arm Pressure (mmHG) +----------+--------+--------+----------------+-------------------+ [id number]             Multiphasic, WNL                    +----------+--------+--------+----------------+-------------------+ +---------+--------+--+--------+--+---------+ VertebralPSV cm/s46EDV cm/s11Antegrade +---------+--------+--+--------+--+---------+  Summary: Right Carotid: Velocities in the right ICA are consistent with a  1-39% stenosis. Left Carotid: Velocities in the left ICA are consistent with a 1-39% stenosis. Vertebrals:  Bilateral vertebral arteries demonstrate antegrade flow. Subclavians: Normal flow hemodynamics were seen in bilateral subclavian              arteries. *See table(s) above for measurements and observations.  Electronically signed by Janet Mayo MD on 06/18/2019 at 5:40:30 PM.    Final     Assessment and Plan:   1. Syncope: patient with two syncopal events 06/17/2019, one with fall from couch, the other was a fall from standing; both with LOC for seconds. No CP, SOB, or palpitations in the events surrounding the fall. EKG non-ischemic. Orthostatics negative. CT Head negative. Carotid duplex with mild stenosis. She was found to be in atrial fibrillation on arrival - possible this contributed to her episodes. She has had intermittent RVR this admission. No clear cause of syncope but possible this was related to Afib - No further work up at this time - could  consider a cardiac monitor outpatient if this reoccurs.  - Rate control of Afib as below  2. Paroxysmal atrial fibrillation: patient has been in atrial fibrillation this admission with intermittent RVR. Home metoprolol was increased from 25mg  BID to 37.5mg  BID for improved rate control. On review of prior EKGs, she has been bradycardic to the 40s-50s when in sinus rhythm.  - Continue metoprolol tartrate 37.5mg  BID - Continue eliquis 2.5mg  BID - dose adjusted for age and weight <60kg  3. HTN: BP intermittently soft. Home amlodipine held to allow more room for titration of rate controlling medications.  - Continue metoprolol as above.   4. HLD: LDL 115 01/2017 - Continue omega-3   For questions or updates, please contact [facility] Please consult [email or web address] for contact info under Cardiology/STEMI.   Signed, Abigail Butts, PA-C  06/20/2019 12:41 PM [phone]

## 2019-06-20 NOTE — Plan of Care (Signed)
Patient is calm and cooperative with staff today. No signs of increased anxiety at this time.

## 2019-06-21 DIAGNOSIS — E872 Acidosis: Secondary | ICD-10-CM

## 2019-06-21 LAB — CBC WITH DIFFERENTIAL/PLATELET
Abs Immature Granulocytes: 0.06 10*3/uL (ref 0.00–0.07)
Basophils Absolute: 0 10*3/uL (ref 0.0–0.1)
Basophils Relative: 0 %
Eosinophils Absolute: 0.2 10*3/uL (ref 0.0–0.5)
Eosinophils Relative: 4 %
HCT: 36 % (ref 36.0–46.0)
Hemoglobin: 11.8 g/dL — ABNORMAL LOW (ref 12.0–15.0)
Immature Granulocytes: 1 %
Lymphocytes Relative: 21 %
Lymphs Abs: 1.4 10*3/uL (ref 0.7–4.0)
MCH: 31.2 pg (ref 26.0–34.0)
MCHC: 32.8 g/dL (ref 30.0–36.0)
MCV: 95.2 fL (ref 80.0–100.0)
Monocytes Absolute: 0.9 10*3/uL (ref 0.1–1.0)
Monocytes Relative: 13 %
Neutro Abs: 4.1 10*3/uL (ref 1.7–7.7)
Neutrophils Relative %: 61 %
Platelets: 240 10*3/uL (ref 150–400)
RBC: 3.78 MIL/uL — ABNORMAL LOW (ref 3.87–5.11)
RDW: 13.2 % (ref 11.5–15.5)
WBC: 6.8 10*3/uL (ref 4.0–10.5)
nRBC: 0 % (ref 0.0–0.2)

## 2019-06-21 LAB — COMPREHENSIVE METABOLIC PANEL
ALT: 17 U/L (ref 0–44)
AST: 15 U/L (ref 15–41)
Albumin: 2.8 g/dL — ABNORMAL LOW (ref 3.5–5.0)
Alkaline Phosphatase: 73 U/L (ref 38–126)
Anion gap: 11 (ref 5–15)
BUN: 18 mg/dL (ref 8–23)
CO2: 22 mmol/L (ref 22–32)
Calcium: 8.4 mg/dL — ABNORMAL LOW (ref 8.9–10.3)
Chloride: 102 mmol/L (ref 98–111)
Creatinine, Ser: 0.7 mg/dL (ref 0.44–1.00)
GFR calc Af Amer: >60 mL/min (ref >60)
GFR calc non Af Amer: >60 mL/min (ref >60)
Glucose, Bld: 129 mg/dL — ABNORMAL HIGH (ref 70–99)
Potassium: 3.8 mmol/L (ref 3.5–5.1)
Sodium: 135 mmol/L (ref 135–145)
Total Bilirubin: 0.7 mg/dL (ref 0.3–1.2)
Total Protein: 5.7 g/dL — ABNORMAL LOW (ref 6.5–8.1)

## 2019-06-21 LAB — RETICULOCYTES
Immature Retic Fract: 12.1 % (ref 2.3–15.9)
RBC.: 4.08 MIL/uL (ref 3.87–5.11)
Retic Count, Absolute: 94.7 10*3/uL (ref 19.0–186.0)
Retic Ct Pct: 2.3 % (ref 0.4–3.1)

## 2019-06-21 LAB — PHOSPHORUS: Phosphorus: 3.4 mg/dL (ref 2.5–4.6)

## 2019-06-21 LAB — IRON AND TIBC
Iron: 42 ug/dL (ref 28–170)
Saturation Ratios: 15 % (ref 10.4–31.8)
TIBC: 283 ug/dL (ref 250–450)
UIBC: 241 ug/dL

## 2019-06-21 LAB — VITAMIN B12: Vitamin B-12: 181 pg/mL (ref 180–914)

## 2019-06-21 LAB — FERRITIN: Ferritin: 92 ng/mL (ref 11–307)

## 2019-06-21 LAB — FOLATE: Folate: 12.9 ng/mL (ref >5.9)

## 2019-06-21 LAB — MAGNESIUM: Magnesium: 1.9 mg/dL (ref 1.7–2.4)

## 2019-06-21 MED ORDER — METOPROLOL TARTRATE 50 MG PO TABS: 50.0000 mg | ORAL_TABLET | Freq: Two times a day (BID) | ORAL | 0 refills | Status: DC

## 2019-06-21 NOTE — Progress Notes (Signed)
Cardiology Progress Note  Patient ID: [name] MRN: [id number] DOB: 1929/11/15 Date of Encounter: 06/21/2019  Primary Cardiologist: No primary care provider on file.  Subjective  Brief A. fib with RVR this morning.  Rates better after morning metoprolol dose.  No complaints.  ROS:  All other ROS reviewed and negative. Pertinent positives noted in the HPI.     Inpatient Medications  Scheduled Meds: . apixaban  2.5 mg Oral BID  . metoprolol tartrate  50 mg Oral BID   Continuous Infusions:  PRN Meds: acetaminophen **OR** acetaminophen, labetalol, ondansetron **OR** ondansetron (ZOFRAN) IV   Vital Signs   Vitals:   06/20/19 1934 06/20/19 2331 06/21/19 0345 06/21/19 0735  BP: 120/68 115/80 124/71 121/79  Pulse:  84 80 80  Resp: (!) 25 17 17 19   Temp: 98 F (36.7 C)  97.9 F (36.6 C) 98 F (36.7 C)  TempSrc: Oral  Oral Oral  SpO2: 100% 100% 100% 99%    Intake/Output Summary (Last 24 hours) at 06/21/2019 1058 Last data filed at 06/21/2019 0855 Gross per 24 hour  Intake 120 ml  Output -  Net 120 ml   Last 3 Weights 05/06/2019 12/21/2018 11/05/2018  Weight (lbs) 102 lb 96 lb 1.9 oz 96 lb  Weight (kg) 46.267 kg 43.6 kg 43.545 kg      Telemetry  Overnight telemetry shows atrial fibrillation, brief episode of RVR this morning, now in the 80-90 range, which I personally reviewed.   ECG  The most recent ECG shows atrial fibrillation, which I personally reviewed.   Physical Exam   Vitals:   06/20/19 1934 06/20/19 2331 06/21/19 0345 06/21/19 0735  BP: 120/68 115/80 124/71 121/79  Pulse:  84 80 80  Resp: (!) 25 17 17 19   Temp: 98 F (36.7 C)  97.9 F (36.6 C) 98 F (36.7 C)  TempSrc: Oral  Oral Oral  SpO2: 100% 100% 100% 99%     Intake/Output Summary (Last 24 hours) at 06/21/2019 1058 Last data filed at 06/21/2019 0855 Gross per 24 hour  Intake 120 ml  Output -  Net 120 ml    Last 3 Weights 05/06/2019 12/21/2018 11/05/2018  Weight (lbs) 102 lb 96 lb 1.9 oz 96  lb  Weight (kg) 46.267 kg 43.6 kg 43.545 kg    There is no height or weight on file to calculate BMI.  General: Well nourished, well developed, in no acute distress Head: Atraumatic, normal size  Eyes: PEERLA, EOMI  Neck: Supple, no JVD Endocrine: No thryomegaly Cardiac: Irregular rhythm no murmurs rubs or gallops lungs: Clear to auscultation bilaterally, no wheezing, rhonchi or rales  Abd: Soft, nontender, no hepatomegaly  Ext: No edema, pulses 2+ Musculoskeletal: No deformities, BUE and BLE strength normal and equal Skin: Warm and dry, no rashes   Neuro: Alert and oriented to person, place, time, and situation, CNII-XII grossly intact, no focal deficits  Psych: Normal mood and affect   Labs  High Sensitivity Troponin:   Recent Labs  Lab 06/17/19 2000 06/18/19 0323  TROPONINIHS 3 3     Cardiac EnzymesNo results for input(s): TROPONINI in the last 168 hours. No results for input(s): TROPIPOC in the last 168 hours.  Chemistry Recent Labs  Lab 06/18/19 0323 06/19/19 0306 06/20/19 0827 06/21/19 0241  NA 137 135 134* 135  K 3.4* 4.5 4.3 3.8  CL 102 104 104 102  CO2 23 22 20* 22  GLUCOSE 131* 123* 157* 129*  BUN 13 17 16  18  CREATININE 0.75 0.76 0.74 0.70  CALCIUM 8.5* 8.4* 8.7* 8.4*  PROT 6.3*  --  6.3* 5.7*  ALBUMIN 3.3*  --  3.2* 2.8*  AST 18  --  19 15  ALT 20  --  18 17  ALKPHOS 84  --  85 73  BILITOT 1.1  --  0.7 0.7  GFRNONAA >60 >60 >60 >60  GFRAA >60 >60 >60 >60  ANIONGAP 12 9 10 11     Hematology Recent Labs  Lab 06/19/19 0306 06/20/19 0827 06/21/19 0241 06/21/19 0959  WBC 8.3 7.8 6.8  --   RBC 3.94 4.03 3.78* 4.08  HGB 12.1 12.4 11.8*  --   HCT 37.6 38.7 36.0  --   MCV 95.4 96.0 95.2  --   MCH 30.7 30.8 31.2  --   MCHC 32.2 32.0 32.8  --   RDW 13.5 13.4 13.2  --   PLT 238 256 240  --    BNPNo results for input(s): BNP, PROBNP in the last 168 hours.  DDimer No results for input(s): DDIMER in the last 168 hours.   Radiology  No results found.   Cardiac Studies  TTE 06/20/2019  1. Left ventricular ejection fraction, by visual estimation, is 50 to 55%. The left ventricle has low normal function. Left ventricular septal wall thickness was mildly increased. There is no left ventricular hypertrophy.  2. Global right ventricle has mildly reduced systolic function.The right ventricular size is mildly enlarged. No increase in right ventricular wall thickness.  3. Left atrial size was normal.  4. Right atrial size was moderately dilated.  5. The pericardial effusion is lateral to the left ventricle.  6. Trivial pericardial effusion is present.  7. Moderate calcification of the mitral valve leaflet(s).  8. Moderate mitral annular calcification.  9. Moderate thickening of the mitral valve leaflet(s). 10. The mitral valve is degenerative. Mild mitral valve regurgitation. 11. The tricuspid valve is normal in structure. Tricuspid valve regurgitation moderate. 12. The aortic valve is tricuspid. Aortic valve regurgitation is mild. sclerosis without stenosis. 13. There is Moderate calcification of the aortic valve. 14. There is Moderate thickening of the aortic valve. 15. The pulmonic valve was not well visualized. Pulmonic valve regurgitation is mild. 16. Shadowing artifact normal diameter. 17. Normal pulmonary artery systolic pressure.   Patient Profile  [name] is a 85 y.o. female with history of paroxysmal atrial fibrillation, dementia, hypertension who was admitted for syncope.  Cardiology consulted for syncope and A. fib with RVR.  Assessment & Plan  1.  Atrial fibrillation with RVR: She did have an episode this morning of RVR but it is controlled after 50 mg of p.o. metoprolol tartrate.  Would recommend to continue this at discharge.  Please continue her Eliquis 2.5 mg twice daily.  She is not bothered by her atrial fibrillation, and given her advanced age and DNR status, and dementia I think it is best we just proceed with rate  control for now.  We will arrange a follow-up appointment for her to see [place] in 4 to 6 weeks. 2.  Syncope: Really unclear history but I suspect this is likely orthostasis of some sort.  Her echocardiogram is unremarkable without evidence of structural heart disease.  Her telemetry has shown atrial fibrillation with RVR and she has not passed out, further suggestive that atrial fibrillation was not the etiology of her syncope.  Her CT head was negative for acute stroke.  Carotid Dopplers with no evidence of any carotid  artery disease to explain her symptoms.  No ischemic changes on EKG and no evidence of ACS with cardiac enzyme evaluation.  Overall likely just orthostasis and would recommend supportive care such as hydration and increase oral intake.  [facility] will sign off.   Medication Recommendations: Metoprolol tartrate 50 mg twice daily, Eliquis 2.5 mg twice daily Other recommendations (labs, testing, etc): None Follow up as an outpatient: We will arrange follow-up for her to see me in 4 to 6 weeks  For questions or updates, please contact [place] Please consult [email or web address] for contact info under        Signed, Lake Bells T. Audie Box, [place]  06/21/2019 10:58 AM

## 2019-06-21 NOTE — Discharge Summary (Signed)
Physician Discharge Summary  [name] [id number] DOB: [birth year] DOA: 06/17/2019  PCP: Binnie Rail, MD  Admit date: 06/17/2019 Discharge date: 06/21/2019  Admitted From: Home Disposition: Home with Home Health  Recommendations for Outpatient Follow-up:  1. Follow up with PCP in 1-2 weeks 2. Follow up with Cardiology Dr. Eleonore Chiquito in 1-2 weeks 3. Please obtain CMP/CBC, Mag, Phos in one week 4. Please follow up on the following pending results:  Home Health: Yes  Equipment/Devices: None   Discharge Condition: Stable  CODE STATUS: DO NOT RESUSCITATE, poA Diet recommendation:   Brief/Interim Summary: HPI per Dr. Early Osmond on 06/17/2019 Orma Render a 85 y.o.femalewith medical history significant ofhypertension, paroxysmal A. Fib-on Eliquis, arthritis presents to emergency department due to loss of consciousness and fall x2.  Patient's son who is at the bedside reports that patient was doing well this morning,was watching TV on the couch and suddenly she fell from the couch and her eyes rolled up, no seizure orfrothing from mouth or tongue biting reported. This episode lasted for few seconds. Patient back to her normal mentation within few seconds. Sonchecked her blood pressure which was within normal limit, pulse was irregularly irregular. The second episode happened when she was in the kitchen at 1230 and she fellsuddenly on the ground and hit on her head-son found her unresponsive. This episode also lasted for few seconds. Son called EMS and brought patient to the emergency department for further evaluation and management.   No history of headache, blurry vision, dizziness, lightheadedness prior or after the episode. Patient deniescough, congestion, fever, chills,chest pain, shortness of breath, palpitation, leg swelling, nausea, vomiting, diarrhea, decreased appetite, urinary or bowel changes.   Her son who is also a power of attorney lives  with her, no history of smoking, alcohol, illicit drug use.  ED Course:Upon arrival to ED, patient's vital signs stable, PT/INR: WNL, initial labs such as CBC, CMP: WNL, COVID-19 pending, CT head and cervical spine came back negative for acute findings. EDP consulted neurology who recommended syncope work-up. Neurology signed off.  **Interim History  She has had periods of anxiousness where her heart rate shoots up into the 150s and yesterday she became very anxious and heart rate was greater than 150.  Today she also had heart rate in the 160s so cardiology was consulted for further evaluation recommendations and her metoprolol was increased to 50 mg p.o. twice daily.    A few nights ago she became combative but now she is pleasant.  Cardiology signed off the case and she has been stable and stable be discharged as her heart rate is well controlled with the new metoprolol dosing and she has not had any more episodes of syncope.  She will need to follow-up with cardiology in the outpatient setting along with her PCP within 1 to 2 weeks  Discharge Diagnoses:  Principal Problem:   Syncope Active Problems:   Essential hypertension   Paroxysmal atrial fibrillation (HCC)   Atrial fibrillation with RVR ([place])   DNR (do not resuscitate)   Metabolic acidosis  Syncope -Unknown etiology but could be from Orthostasis? But Orthostatic Vital Signs unremarkable for the second time -? A fib-- has parox a fib and now with RVR and has had Uncontrolled HR's -EEG showed "This study issuggestive of non specific cortical dysfunction in left temporal region.No seizures or epileptiform discharges were seen throughout the recording." -Given gentle IV fluid hydration overnight -Carotid Doppler done and showed Stenosis of 1-39% bilaterally  -  Orthostatic vitals unremarkable -TSH unremarkable -UA unremarkable except for Pyuria  -Chest x-ray pending -CT head and cervical spine came back negative for acute  findings -Echo showed EF of 50 to 55% -EDP consulted Neurology who recommended syncope work-up,neurology signed off -Will consult Cardiology given Uncontrolled HR -Physical therapy: No Follow up Initially but will now require Home Health PT and will discharge with PT  Paroxysmal A. Fib- now with RVR -IncreasedMetoprolol to 37.5 mg po BID -ECHOCardiogram showed "Left ventricular ejection fraction, by visual estimation, is 50 to 55%. The left ventricle has low normal function. The left ventricular internal cavity size was the left ventricle is normal in size. There is no left ventricular  Hypertrophy." -TSH was 1.497 -HS-Troponin were flat  -C/w Apixaban 2.5 mg po BID  -C/w Telemetry Monitoring -HR's have been uncontrolled and Tahcycardic so will consult Cardiology for further Evaluation and Recc's and they recommended increasing metoprolol to 50 mg twice daily For patients improved and will need to follow-up with cardiology Dr. Evalee Jefferson within 1 to 2 weeks  Hypertension -Stable -D/C'd Amlodipine so HR controlling medications can be increased as Metoprolol is now 50 mg po BID and will continue at discharge -Continue with Labetalol 10 mg IV every 4 as needed for high blood pressure  History of T12 compression fracture -Status post kyphoplasty -Continue Acetaminophen 650 mg po q6h as needed for pain  Metabolic Acidosis -Mild and improved -Patient CO2 was 20, chloride was104, and anion gap was 10; Repeat this AM showed a CO2 of 22, AG of 11, and Chloride of 102 -Continue to monitor and trend -Repeat CMP within 1 week   Hyponatremia, improved  -Mild with a sodium of 134 yesterday and improved to 135 -Replete with IV sodium phosphate 10 mmol yesterday as along with some IV fluid hydration -Continue to monitor and trend and repeat CMP in a.m.  Hypophosphatemia -Patient's phosphorus level yesterday morning was 2.4 and improved to 3.4 -Replete with IV sodium Phos 10 mmol  yesterday  -Continue to monitor and replete as necessary -Repeat phosphorus level within 1 week   GOC: DNR, poA  Discharge Instructions  Discharge Instructions    Ambulatory referral to Cardiology   Complete by: As directed    [place]   Call MD for:  difficulty breathing, headache or visual disturbances   Complete by: As directed    Call MD for:  extreme fatigue   Complete by: As directed    Call MD for:  hives   Complete by: As directed    Call MD for:  persistant dizziness or light-headedness   Complete by: As directed    Call MD for:  persistant nausea and vomiting   Complete by: As directed    Call MD for:  redness, tenderness, or signs of infection (pain, swelling, redness, odor or green/yellow discharge around incision site)   Complete by: As directed    Call MD for:  severe uncontrolled pain   Complete by: As directed    Call MD for:  temperature >100.4   Complete by: As directed    Diet - low sodium heart healthy   Complete by: As directed    Discharge instructions   Complete by: As directed    You were cared for by a hospitalist during your hospital stay. If you have any questions about your discharge medications or the care you received while you were in the hospital after you are discharged, you can call the unit and ask to  speak with the hospitalist on call if the hospitalist that took care of you is not available. Once you are discharged, your primary care physician will handle any further medical issues. Please note that NO REFILLS for any discharge medications will be authorized once you are discharged, as it is imperative that you return to your primary care physician (or establish a relationship with a primary care physician if you do not have one) for your aftercare needs so that they can reassess your need for medications and monitor your lab values.  Follow up with PCP and Cardiology. Take all medications as prescribed. If symptoms change or  worsen please return to the ED for evaluation   Increase activity slowly   Complete by: As directed      Allergies as of 06/21/2019   No Known Allergies     Medication List    STOP taking these medications   amLODipine 5 MG tablet Commonly known as: NORVASC     TAKE these medications   acetaminophen 500 MG tablet Commonly known as: TYLENOL Take 500 mg by mouth daily as needed for moderate pain.   calcium carbonate 600 MG Tabs tablet Commonly known as: OS-CAL Take 600 mg by mouth 2 (two) times daily.   Eliquis 2.5 MG Tabs tablet Generic drug: apixaban TAKE 1 TABLET BY MOUTH TWICE A DAY What changed: how much to take   Fish Oil 1000 MG Caps Take 1,000 mg by mouth daily with lunch.   Magnesium 400 MG Tabs Take 400 mg by mouth daily with lunch.   metoprolol tartrate 50 MG tablet Commonly known as: LOPRESSOR Take 1 tablet (50 mg total) by mouth 2 (two) times daily. What changed:   medication strength  how much to take   multivitamin-lutein Caps capsule Take 1 capsule by mouth daily with lunch.   traMADol 50 MG tablet Commonly known as: ULTRAM TAKE 1 TABLET BY MOUTH EVERY 8 HOURS AS NEEDED FOR MODERATE PAIN. What changed: See the new instructions.   tretinoin 0.05 % cream Commonly known as: RETIN-A Apply 1 application topically daily.   Vitamin D3 Super Strength 50 MCG (2000 UT) Tabs Generic drug: Cholecalciferol Take 2,000 Units by mouth daily with lunch.      Follow-up Information    O'Neal, Cassie Freer, MD Follow up on 07/22/2019.   Specialties: Internal Medicine, Cardiology Why: Please arrive 15 minutes early for your 9:20am appointment.  Contact information: [place] [address] [phone]          No Known Allergies  Consultations:  Cardiology  Procedures/Studies: Ct Cervical Spine Wo Contrast  Result Date: 06/17/2019 CLINICAL DATA:  Syncope EXAM: CT CERVICAL SPINE WITHOUT CONTRAST TECHNIQUE: Multidetector CT  imaging of the cervical spine was performed without intravenous contrast. Multiplanar CT image reconstructions were also generated. COMPARISON:  None. FINDINGS: Alignment: Reversal of the cervical lordosis centered at C4. Mild anterolisthesis at C3-C4 and retrolisthesis at C4-C5 and C5-C6. Skull base and vertebrae: No acute fracture. Vertebral body heights are maintained apart from degenerative endplate irregularity. Soft tissues and spinal canal: No prevertebral soft tissue swelling or fluid. No visible epidural collection or hematoma. Disc levels: There is multilevel disc space narrowing, endplate osteophytes and irregularity, and facet and uncovertebral hypertrophy. There is no high-grade canal stenosis at any level. Foraminal stenosis is greatest on the left at C3-C4 and on the right at C5-C6. Upper chest: Scarring at the lung apices. Other: None. IMPRESSION: No acute cervical spine fracture. Electronically Signed  By: Macy Mis M.D.   On: 06/17/2019 14:16   Dg Chest Port 1 View  Result Date: 06/18/2019 CLINICAL DATA:  Syncope. EXAM: PORTABLE CHEST 1 VIEW COMPARISON:  03/29/2018 FINDINGS: Heart size is normal considering the AP portable technique. Pulmonary vascularity is normal. Aortic atherosclerosis. Lungs are clear. No effusions. No acute bone abnormalities. Old right mid humerus fracture. IMPRESSION: 1. No active disease. 2.  Aortic Atherosclerosis (ICD10-I70.0). Electronically Signed   By: Lorriane Shire M.D.   On: 06/18/2019 14:13   Ct Head Code Stroke Wo Contrast  Result Date: 06/17/2019 CLINICAL DATA:  Code stroke. EXAM: CT HEAD WITHOUT CONTRAST TECHNIQUE: Contiguous axial images were obtained from the base of the skull through the vertex without intravenous contrast. COMPARISON:  None. FINDINGS: Brain: There is no acute intracranial hemorrhage, mass effect, or edema. Gray-white differentiation is preserved. There is no extra-axial fluid collection. Ventricles and sulci are within normal  limits in size and configuration. Patchy hypoattenuation in the supratentorial white matter is nonspecific but probably reflects mild to moderate chronic microvascular ischemic changes. A small focal area of low attenuation within the right basal ganglia likely reflects an age-indeterminate small vessel infarct. Vascular: No hyperdense vessel. There is intracranial atherosclerotic calcification at the skull base. Skull: Unremarkable Sinuses/Orbits: Minor paranasal sinus mucosal thickening. No acute orbital abnormality. Other: Mastoid air cells are clear. ASPECTS [facility] Stroke Program Early CT Score) - Ganglionic level infarction (caudate, lentiform nuclei, internal capsule, insula, M1-M3 cortex): 7 - Supraganglionic infarction (M4-M6 cortex): 3 Total score (0-10 with 10 being normal): 10 IMPRESSION: 1. No acute intracranial hemorrhage or mass effect. Age indeterminate small vessel infarction of the right lentiform nucleus. 2. ASPECTS is 10. 3. Chronic microvascular ischemic changes. Electronically Signed   By: Macy Mis M.D.   On: 06/17/2019 14:06   Vas US Carotid  Result Date: 06/18/2019 Carotid Arterial Duplex Study Indications:       Syncope. Risk Factors:      Hypertension. Other Factors:     A-fib. Comparison Study:  No prior study Performing Technologist: Maudry Mayhew MHA, RDMS, RVT, RDCS  Examination Guidelines: A complete evaluation includes B-mode imaging, spectral Doppler, color Doppler, and power Doppler as needed of all accessible portions of each vessel. Bilateral testing is considered an integral part of a complete examination. Limited examinations for reoccurring indications may be performed as noted.  Right Carotid Findings: +----------+--------+--------+--------+------------------+------------------+           PSV cm/sEDV cm/sStenosisPlaque DescriptionComments           +----------+--------+--------+--------+------------------+------------------+ CCA Prox  67      11                                 intimal thickening +----------+--------+--------+--------+------------------+------------------+ CCA Distal56      12                                                   +----------+--------+--------+--------+------------------+------------------+ ICA Prox  42      9                                 intimal thickening +----------+--------+--------+--------+------------------+------------------+ ICA Distal100     11                                                   +----------+--------+--------+--------+------------------+------------------+  ECA       92                                                           +----------+--------+--------+--------+------------------+------------------+ +----------+--------+-------+----------------+-------------------+           PSV cm/sEDV cmsDescribe        Arm Pressure (mmHG) +----------+--------+-------+----------------+-------------------+ Subclavian109            Multiphasic, WNL                    +----------+--------+-------+----------------+-------------------+ +---------+--------+--+--------+-+---------+ VertebralPSV cm/s26EDV cm/s8Antegrade +---------+--------+--+--------+-+---------+  Left Carotid Findings: +----------+--------+-------+--------+----------------------+------------------+           PSV cm/sEDV    StenosisPlaque Description    Comments                             cm/s                                                    +----------+--------+-------+--------+----------------------+------------------+ CCA Prox  59      10                                   intimal thickening +----------+--------+-------+--------+----------------------+------------------+ CCA Distal82      12             smooth and                                                                heterogenous                              +----------+--------+-------+--------+----------------------+------------------+ ICA Prox  45      11             smooth and                                                                heterogenous                             +----------+--------+-------+--------+----------------------+------------------+ ICA Distal69      13                                                      +----------+--------+-------+--------+----------------------+------------------+ ECA       78      5                                                       +----------+--------+-------+--------+----------------------+------------------+ +----------+--------+--------+----------------+-------------------+  PSV cm/sEDV cm/sDescribe        Arm Pressure (mmHG) +----------+--------+--------+----------------+-------------------+ [id number]             Multiphasic, WNL                    +----------+--------+--------+----------------+-------------------+ +---------+--------+--+--------+--+---------+ VertebralPSV cm/s46EDV cm/s11Antegrade +---------+--------+--+--------+--+---------+  Summary: Right Carotid: Velocities in the right ICA are consistent with a 1-39% stenosis. Left Carotid: Velocities in the left ICA are consistent with a 1-39% stenosis. Vertebrals:  Bilateral vertebral arteries demonstrate antegrade flow. Subclavians: Normal flow hemodynamics were seen in bilateral subclavian              arteries. *See table(s) above for measurements and observations.  Electronically signed by Deitra Mayo MD on 06/18/2019 at 5:40:30 PM.    Final    ECHOCARDIOGRAM IMPRESSIONS   1. Left ventricular ejection fraction, by visual estimation, is 50 to 55%. The left ventricle has low normal function. Left ventricular septal wall thickness was mildly increased. There is no left ventricular hypertrophy. 2. Global right ventricle has mildly reduced systolic function.The right  ventricular size is mildly enlarged. No increase in right ventricular wall thickness. 3. Left atrial size was normal. 4. Right atrial size was moderately dilated. 5. The pericardial effusion is lateral to the left ventricle. 6. Trivial pericardial effusion is present. 7. Moderate calcification of the mitral valve leaflet(s). 8. Moderate mitral annular calcification. 9. Moderate thickening of the mitral valve leaflet(s). 10. The mitral valve is degenerative. Mild mitral valve regurgitation. 11. The tricuspid valve is normal in structure. Tricuspid valve regurgitation moderate. 12. The aortic valve is tricuspid. Aortic valve regurgitation is mild. sclerosis without stenosis. 13. There is Moderate calcification of the aortic valve. 14. There is Moderate thickening of the aortic valve. 15. The pulmonic valve was not well visualized. Pulmonic valve regurgitation is mild. 16. Shadowing artifact normal diameter. 17. Normal pulmonary artery systolic pressure.  FINDINGS Left Ventricle: Left ventricular ejection fraction, by visual estimation, is 50 to 55%. The left ventricle has low normal function. The left ventricular internal cavity size was the left ventricle is normal in size. There is no left ventricular  hypertrophy.  Right Ventricle: The right ventricular size is mildly enlarged. No increase in right ventricular wall thickness. Global RV systolic function is has mildly reduced systolic function. The tricuspid regurgitant velocity is 2.39 m/s, and with an assumed  right atrial pressure of 3 mmHg, the estimated right ventricular systolic pressure is normal at 25.9 mmHg.  Left Atrium: Left atrial size was normal in size.  Right Atrium: Right atrial size was moderately dilated  Pericardium: Trivial pericardial effusion is present. The pericardial effusion is lateral to the left ventricle.  Mitral Valve: The mitral valve is degenerative in appearance. There is moderate  thickening of the mitral valve leaflet(s). There is moderate calcification of the mitral valve leaflet(s). Moderate mitral annular calcification. Mild mitral valve  regurgitation.  Tricuspid Valve: The tricuspid valve is normal in structure. Tricuspid valve regurgitation moderate.  Aortic Valve: The aortic valve is tricuspid. . There is Moderate thickening and Moderate calcification of the aortic valve. Aortic valve regurgitation is mild. Sclerosis without stenosis. There is Moderate thickening of the aortic valve. Moderate  calcification.  Pulmonic Valve: The pulmonic valve was not well visualized. Pulmonic valve regurgitation is mild.  Aorta: The aortic root is normal in size and structure. Shadowing artifact normal diameter.  IAS/Shunts: No atrial level shunt detected by color flow Doppler.    LEFT VENTRICLE PLAX 2D  LVIDd: 3.70 cm Diastology LVIDs: 2.70 cm LV e' lateral: 6.82 cm/s LV PW: 1.40 cm LV E/e' lateral: 9.2 LV IVS: 1.30 cm LV e' medial: 6.24 cm/s LVOT diam: 1.70 cm LV E/e' medial: 10.1 LV SV: 31 ml LV SV Index: 21.67 LVOT Area: 2.27 cm   RIGHT VENTRICLE RV S prime: 13.80 cm/s  LEFT ATRIUM Index RIGHT ATRIUM Index LA diam: 2.70 cm 1.84 cm/m RA Area: 21.30 cm LA Vol (A4C): 29.9 ml 20.35 ml/m RA Volume: 69.30 ml 47.16 ml/m AORTIC VALVE LVOT Vmax: 40.50 cm/s LVOT Vmean: 31.700 cm/s LVOT VTI: 0.081 m  AORTA Ao Root diam: 3.30 cm Ao Asc diam: 3.20 cm  MITRAL VALVE TRICUSPID VALVE MV Area (PHT): 5.62 cm TR Peak grad: 22.9 mmHg MV PHT: 39.15 msec TR Vmax: 257.00 cm/s MV Decel Time: 135 msec MV E velocity: 62.70 cm/s 103 cm/s SHUNTS Systemic VTI: 0.08 m Systemic Diam: 1.70 cm  EEG on 06/18/2019   Technical  aspects: This EEG study was done with scalp electrodes positioned according to the 10-20 International system of electrode placement. Electrical activity was acquired at a sampling rate of 500Hz  and reviewed with a high frequency filter of 70Hz  and a low frequency filter of 1Hz . EEG data were recorded continuously and digitally stored.  DESCRIPTION: No clearposterior dominant rhythmwas seen. EEG showed intermittent 2-3hz  delta slowing in left temporal region.Hyperventilation and photic stimulation were not performed.  ABNORMALITY - Intermittent slow, left temporal  IMPRESSION: This study issuggestive of non specific cortical dysfunction in left temporal region.No seizures or epileptiform discharges were seen throughout the recording.  CAROTID DOPPLERS Summary: Right Carotid: Velocities in the right ICA are consistent with a 1-39% stenosis.  Left Carotid: Velocities in the left ICA are consistent with a 1-39% stenosis.  Vertebrals: Bilateral vertebral arteries demonstrate antegrade flow. Subclavians: Normal flow hemodynamics were seen in bilateral subclavian arteries.  Subjective: Seen and examined at bedside and she was doing well.  Denies chest pain, lightheadedness or dizziness.  Heart rates were improved.  No other concerns at present time which was deemed stable for discharge only to follow-up with PCP and cardiology in outpatient setting she is understandable and agreeable with the plan of care.  Discharge Exam: Vitals:   06/21/19 0345 06/21/19 0735  BP: 124/71 121/79  Pulse: 80 80  Resp: 17 19  Temp: 97.9 F (36.6 C) 98 F (36.7 C)  SpO2: 100% 99%   Vitals:   06/20/19 1934 06/20/19 2331 06/21/19 0345 06/21/19 0735  BP: 120/68 115/80 124/71 121/79  Pulse:  84 80 80  Resp: (!) 25 17 17 19   Temp: 98 F (36.7 C)  97.9 F (36.6 C) 98 F (36.7 C)  TempSrc: Oral  Oral Oral  SpO2: 100% 100% 100% 99%   General: Pt is alert, awake, not in acute  distress Cardiovascular: Irregularly irregular, S1/S2 +, no rubs, no gallops Respiratory: Diminished bilaterally, no wheezing, no rhonchi Abdominal: Soft, NT, ND, bowel sounds + Extremities: no edema, no cyanosis  The results of significant diagnostics from this hospitalization (including imaging, microbiology, ancillary and laboratory) are listed below for reference.    Microbiology: Recent Results (from the past 240 hour(s))  SARS CORONAVIRUS 2 (TAT 6-24 HRS) Nasopharyngeal Nasopharyngeal Swab     Status: None   Collection Time: 06/17/19  4:47 PM   Specimen: Nasopharyngeal Swab  Result Value Ref Range Status   SARS Coronavirus 2 NEGATIVE NEGATIVE Final    Comment: (NOTE) SARS-CoV-2 target nucleic acids are NOT DETECTED. The  SARS-CoV-2 RNA is generally detectable in upper and lower respiratory specimens during the acute phase of infection. Negative results do not preclude SARS-CoV-2 infection, do not rule out co-infections with other pathogens, and should not be used as the sole basis for treatment or other patient management decisions. Negative results must be combined with clinical observations, patient history, and epidemiological information. The expected result is Negative. Fact Sheet for Patients: SugarRoll.be Fact Sheet for Healthcare Providers: https://www.woods-mathews.com/ This test is not yet approved or cleared by the [place] FDA and  has been authorized for detection and/or diagnosis of SARS-CoV-2 by FDA under an Emergency Use Authorization (EUA). This EUA will remain  in effect (meaning this test can be used) for the duration of the COVID-19 declaration under Section 56 4(b)(1) of the Act, 21 U.S.C. section 360bbb-3(b)(1), unless the authorization is terminated or revoked sooner. Performed at [facility], [place] [address] [place], [place] [address]     Labs: BNP (last 3 results) No results for input(s): BNP  in the last 8760 hours. Basic Metabolic Panel: Recent Labs  Lab 06/17/19 1346 06/17/19 1351 06/18/19 0323 06/19/19 0306 06/20/19 0827 06/21/19 0241  NA 136  --  137 135 134* 135  K 4.2  --  3.4* 4.5 4.3 3.8  CL 103  --  102 104 104 102  CO2 25  --  23 22 20* 22  GLUCOSE 124*  --  131* 123* 157* 129*  BUN 20  --  13 17 16 18   CREATININE 0.78  --  0.75 0.76 0.74 0.70  CALCIUM 8.8*  --  8.5* 8.4* 8.7* 8.4*  MG  --  2.2  --   --  2.1 1.9  PHOS  --  3.7  --   --  2.4* 3.4   Liver Function Tests: Recent Labs  Lab 06/17/19 1346 06/18/19 0323 06/20/19 0827 06/21/19 0241  AST 19 18 19 15   ALT 22 20 18 17   ALKPHOS 91 84 85 73  BILITOT 0.8 1.1 0.7 0.7  PROT 6.5 6.3* 6.3* 5.7*  ALBUMIN 3.4* 3.3* 3.2* 2.8*   No results for input(s): LIPASE, AMYLASE in the last 168 hours. No results for input(s): AMMONIA in the last 168 hours. CBC: Recent Labs  Lab 06/17/19 1346 06/18/19 0323 06/19/19 0306 06/20/19 0827 06/21/19 0241  WBC 7.8 8.7 8.3 7.8 6.8  NEUTROABS 5.9  --  5.7 4.6 4.1  HGB 13.1 12.5 12.1 12.4 11.8*  HCT 40.6 38.5 37.6 38.7 36.0  MCV 96.0 94.8 95.4 96.0 95.2  PLT 262 247 238 256 240   Cardiac Enzymes: No results for input(s): CKTOTAL, CKMB, CKMBINDEX, TROPONINI in the last 168 hours. BNP: Invalid input(s): POCBNP CBG: Recent Labs  Lab 06/17/19 1340  GLUCAP 120*   D-Dimer No results for input(s): DDIMER in the last 72 hours. Hgb A1c No results for input(s): HGBA1C in the last 72 hours. Lipid Profile No results for input(s): CHOL, HDL, LDLCALC, TRIG, CHOLHDL, LDLDIRECT in the last 72 hours. Thyroid function studies No results for input(s): TSH, T4TOTAL, T3FREE, THYROIDAB in the last 72 hours.  Invalid input(s): FREET3 Anemia work up Recent Labs    06/21/19 0959  VITAMINB12 181  FOLATE 12.9  FERRITIN 92  TIBC 283  IRON 42  RETICCTPCT 2.3   Urinalysis    Component Value Date/Time   COLORURINE STRAW (A) 06/17/2019 1845   APPEARANCEUR CLEAR  06/17/2019 1845   LABSPEC 1.005 06/17/2019 1845   PHURINE 8.0 06/17/2019 1845  GLUCOSEU NEGATIVE 06/17/2019 1845   GLUCOSEU NEGATIVE 11/13/2017 [place] 06/17/2019 1845   HGBUR negative 09/10/2008 [place] 06/17/2019 1845   BILIRUBINUR negative 12/21/2018 1101   KETONESUR 5 (A) 06/17/2019 1845   PROTEINUR NEGATIVE 06/17/2019 1845   UROBILINOGEN negative (A) 12/21/2018 1101   UROBILINOGEN 0.2 11/13/2017 1102   NITRITE NEGATIVE 06/17/2019 1845   LEUKOCYTESUR SMALL (A) 06/17/2019 1845   Sepsis Labs Invalid input(s): PROCALCITONIN,  WBC,  LACTICIDVEN Microbiology Recent Results (from the past 240 hour(s))  SARS CORONAVIRUS 2 (TAT 6-24 HRS) Nasopharyngeal Nasopharyngeal Swab     Status: None   Collection Time: 06/17/19  4:47 PM   Specimen: Nasopharyngeal Swab  Result Value Ref Range Status   SARS Coronavirus 2 NEGATIVE NEGATIVE Final    Comment: (NOTE) SARS-CoV-2 target nucleic acids are NOT DETECTED. The SARS-CoV-2 RNA is generally detectable in upper and lower respiratory specimens during the acute phase of infection. Negative results do not preclude SARS-CoV-2 infection, do not rule out co-infections with other pathogens, and should not be used as the sole basis for treatment or other patient management decisions. Negative results must be combined with clinical observations, patient history, and epidemiological information. The expected result is Negative. Fact Sheet for Patients: SugarRoll.be Fact Sheet for Healthcare Providers: https://www.woods-mathews.com/ This test is not yet approved or cleared by the [place] FDA and  has been authorized for detection and/or diagnosis of SARS-CoV-2 by FDA under an Emergency Use Authorization (EUA). This EUA will remain  in effect (meaning this test can be used) for the duration of the COVID-19 declaration under Section 56 4(b)(1) of the Act, 21  U.S.C. section 360bbb-3(b)(1), unless the authorization is terminated or revoked sooner. Performed at [facility], [place] [address] [place], [place] [address]    Time coordinating discharge: 35 minutes  SIGNED:  Kerney Elbe, DO [facility] 06/21/2019, 12:21 PM Pager is on [place]  If 7PM-7AM, please contact night-coverage [email or web address] Password [email or web address]

## 2019-06-21 NOTE — Plan of Care (Signed)
Min assist with adls. HR controlled.

## 2019-06-21 NOTE — TOC Transition Note (Signed)
Transition of Care [facility]) - CM/SW Discharge Note   Patient Details  Name: [name] MRN: [id number] Date of Birth: June 27, 1930  [facility] CM/SW Contact:  Pollie Friar, RN Phone Number: 06/21/2019, 11:53 AM   Clinical Narrative:    Pt is discharging home with [facility] services through [facility]. Valerie with [facility] has accepted the referral.  Son can provide supervision at home and transportation to home.    Final next level of care: Home w Home Health Services Barriers to Discharge: No Barriers Identified   Patient Goals and CMS Choice   CMS Medicare.gov Compare Post Acute Care list provided to:: Patient Represenative (must comment) Choice offered to / list presented to : Adult Children(son)  Discharge Placement                       Discharge Plan and Services                          HH Arranged: PT, OT [place] Agency: [place] ([facility]) Date [place]: 06/21/19   Representative spoke with at [place]: [place] ([place]) Interventions     Readmission Risk Interventions No flowsheet data found.

## 2019-06-24 DIAGNOSIS — R55 Syncope and collapse: Secondary | ICD-10-CM | POA: Diagnosis not present

## 2019-06-24 DIAGNOSIS — Z8781 Personal history of (healed) traumatic fracture: Secondary | ICD-10-CM | POA: Diagnosis not present

## 2019-06-24 DIAGNOSIS — M1990 Unspecified osteoarthritis, unspecified site: Secondary | ICD-10-CM | POA: Diagnosis not present

## 2019-06-24 DIAGNOSIS — I480 Paroxysmal atrial fibrillation: Secondary | ICD-10-CM | POA: Diagnosis not present

## 2019-06-24 DIAGNOSIS — Z7901 Long term (current) use of anticoagulants: Secondary | ICD-10-CM | POA: Diagnosis not present

## 2019-06-24 DIAGNOSIS — I10 Essential (primary) hypertension: Secondary | ICD-10-CM | POA: Diagnosis not present

## 2019-06-24 DIAGNOSIS — Z79891 Long term (current) use of opiate analgesic: Secondary | ICD-10-CM | POA: Diagnosis not present

## 2019-06-24 DIAGNOSIS — D649 Anemia, unspecified: Secondary | ICD-10-CM | POA: Diagnosis not present

## 2019-06-24 DIAGNOSIS — E785 Hyperlipidemia, unspecified: Secondary | ICD-10-CM | POA: Diagnosis not present

## 2019-06-25 ENCOUNTER — Telehealth: Payer: Self-pay | Admitting: *Deleted

## 2019-06-25 NOTE — Telephone Encounter (Signed)
Transition Care Management Follow-up Telephone Call   Date discharged? 06/21/19   How have you been since you were released from the hospital? Spoke w/pt son he states mom is doing ok   Do you understand why you were in the hospital? YES   Do you understand the discharge instructions? YES   Where were you discharged to? Home   Items Reviewed:  Medications reviewed: YES, no changes  Allergies reviewed: YES  Dietary changes reviewed: YES  Referrals reviewed: YES, will see her neurologist in January   Functional Questionnaire:   Activities of Daily Living (ADLs):   He states she are independent in the following: bathing and hygiene, feeding, continence, grooming, toileting and dressing States she  require assistance with the following: ambulation   Any transportation issues/concerns?: NO   Any patient concerns? NO   Confirmed importance and date/time of follow-up visits scheduled YES, appt 07/02/19  Provider Appointment booked with Dr. Quay Burow  Confirmed with patient if condition begins to worsen call PCP or go to the ER.  Patient was given the office number and encouraged to call back with question or concerns.  : YES

## 2019-06-27 ENCOUNTER — Other Ambulatory Visit: Payer: Self-pay | Admitting: *Deleted

## 2019-06-27 NOTE — Patient Outreach (Signed)
Red EMMI general discharge received for Day #4 06/26/19, Lost interest in things?- yes, outreach call and spoke with patient's son Kayelene Elie who reports he answers the EMMI calls and states " that's not correct, its a wrong answer"  Mr. Mackowski reports no new concerns.  Jacqlyn Larsen [facility], [place] Coordinator [phone]

## 2019-06-28 DIAGNOSIS — M1990 Unspecified osteoarthritis, unspecified site: Secondary | ICD-10-CM | POA: Diagnosis not present

## 2019-06-28 DIAGNOSIS — D649 Anemia, unspecified: Secondary | ICD-10-CM | POA: Diagnosis not present

## 2019-06-28 DIAGNOSIS — I480 Paroxysmal atrial fibrillation: Secondary | ICD-10-CM | POA: Diagnosis not present

## 2019-06-28 DIAGNOSIS — Z79891 Long term (current) use of opiate analgesic: Secondary | ICD-10-CM | POA: Diagnosis not present

## 2019-06-28 DIAGNOSIS — Z7901 Long term (current) use of anticoagulants: Secondary | ICD-10-CM | POA: Diagnosis not present

## 2019-06-28 DIAGNOSIS — R55 Syncope and collapse: Secondary | ICD-10-CM | POA: Diagnosis not present

## 2019-06-28 DIAGNOSIS — Z8781 Personal history of (healed) traumatic fracture: Secondary | ICD-10-CM | POA: Diagnosis not present

## 2019-06-28 DIAGNOSIS — E785 Hyperlipidemia, unspecified: Secondary | ICD-10-CM | POA: Diagnosis not present

## 2019-06-28 DIAGNOSIS — I10 Essential (primary) hypertension: Secondary | ICD-10-CM | POA: Diagnosis not present

## 2019-07-01 DIAGNOSIS — Z79891 Long term (current) use of opiate analgesic: Secondary | ICD-10-CM | POA: Diagnosis not present

## 2019-07-01 DIAGNOSIS — I10 Essential (primary) hypertension: Secondary | ICD-10-CM | POA: Diagnosis not present

## 2019-07-01 DIAGNOSIS — Z7901 Long term (current) use of anticoagulants: Secondary | ICD-10-CM | POA: Diagnosis not present

## 2019-07-01 DIAGNOSIS — M1990 Unspecified osteoarthritis, unspecified site: Secondary | ICD-10-CM | POA: Diagnosis not present

## 2019-07-01 DIAGNOSIS — D649 Anemia, unspecified: Secondary | ICD-10-CM | POA: Diagnosis not present

## 2019-07-01 DIAGNOSIS — R55 Syncope and collapse: Secondary | ICD-10-CM | POA: Diagnosis not present

## 2019-07-01 DIAGNOSIS — E785 Hyperlipidemia, unspecified: Secondary | ICD-10-CM | POA: Diagnosis not present

## 2019-07-01 DIAGNOSIS — Z8781 Personal history of (healed) traumatic fracture: Secondary | ICD-10-CM | POA: Diagnosis not present

## 2019-07-01 DIAGNOSIS — I480 Paroxysmal atrial fibrillation: Secondary | ICD-10-CM | POA: Diagnosis not present

## 2019-07-01 IMAGING — CR DG PELVIS 1-2V
1 series · 1 of 1 positions shown · non-contrast
Comparison: CT 09/26/2008

CLINICAL DATA: Pt was wearing heels and fell onto concrete. NO head
neck or back pain. No LOC, no blood thinners. Pulse motor sensation
in right foot, no bruising or redness. Pain only when she moves to
right thigh. Pt HX: non smoker, HTN

EXAM:
PELVIS - 1-2 VIEW

[t pelvis ap]
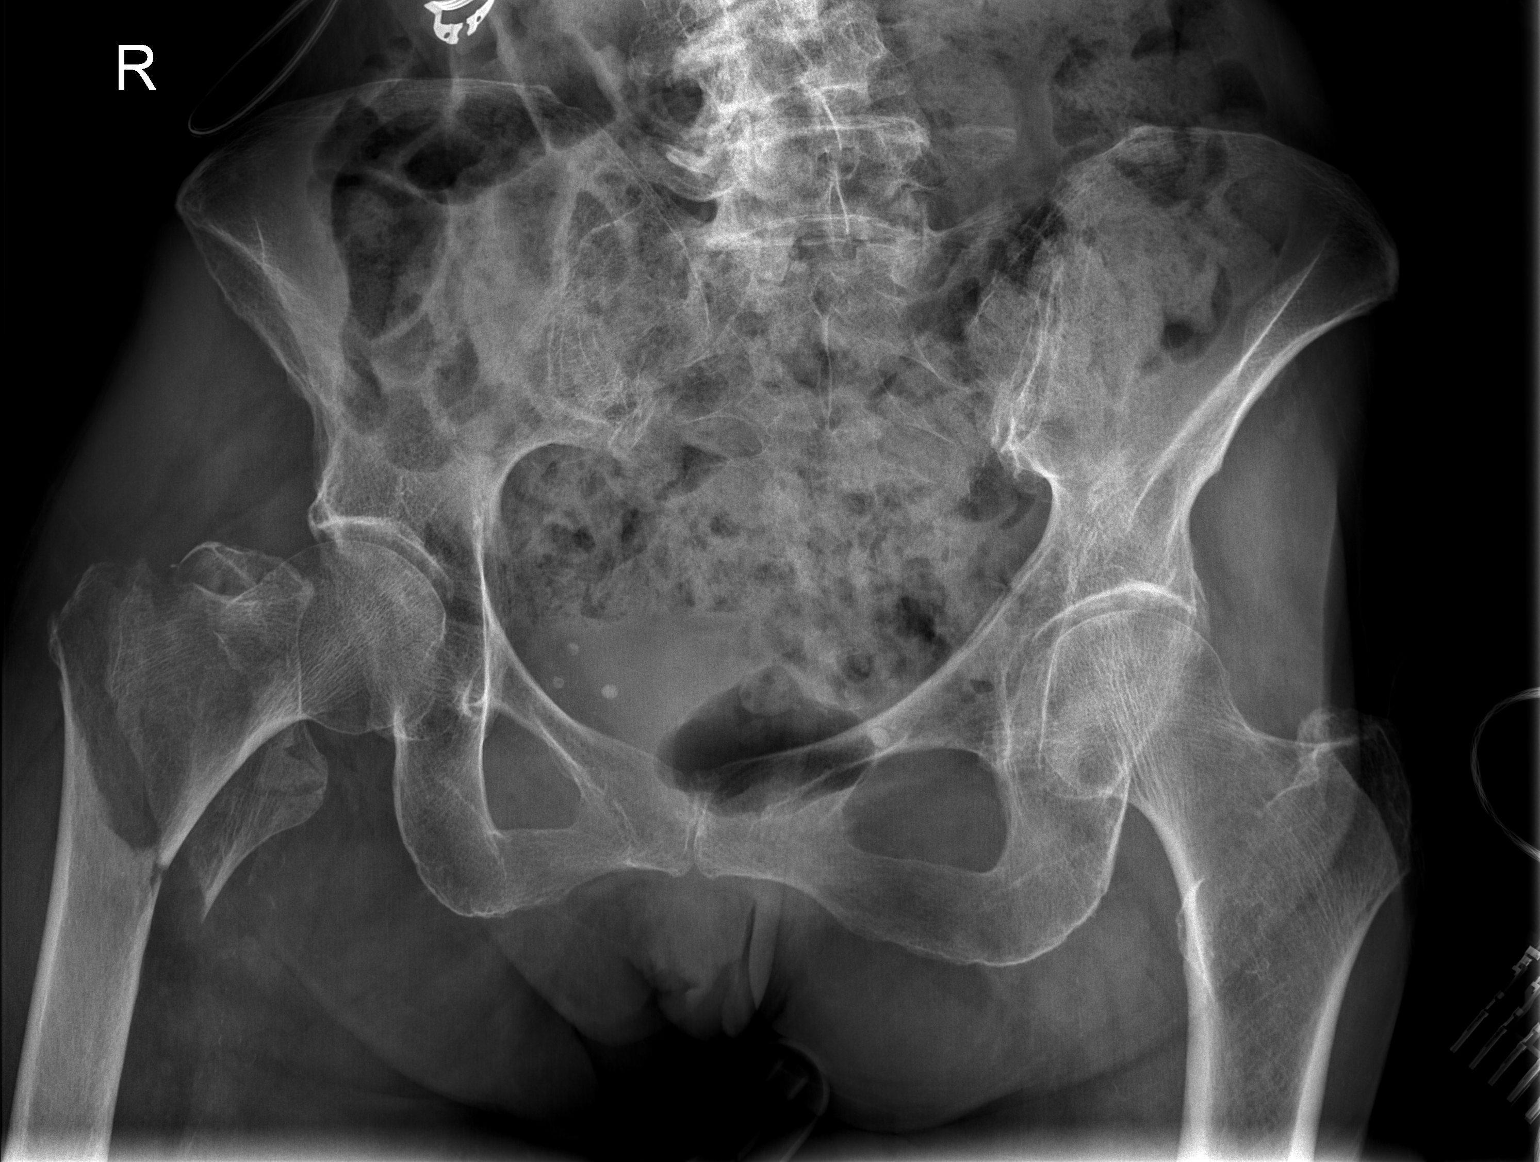

[1 of 1 positions shown; findings below may reference images not displayed]

FINDINGS: Mildly comminuted intratrochanteric fracture of the right femur,
with fragments distracted at least 1.4 cm. No dislocation. Bony
pelvis appears intact. Left hip unremarkable. Bilateral pelvic
phleboliths. Degenerative disc disease in the lower lumbar spine.
IMPRESSION: 1. Distracted right intertrochanteric femur fracture.

## 2019-07-01 IMAGING — CR DG FEMUR 2+V*R*
4 series · 4 of 4 positions shown · non-contrast
Comparison: None.

CLINICAL DATA: Fall onto concrete.

EXAM:
RIGHT FEMUR 2 VIEWS

[t femur proximal ap right]
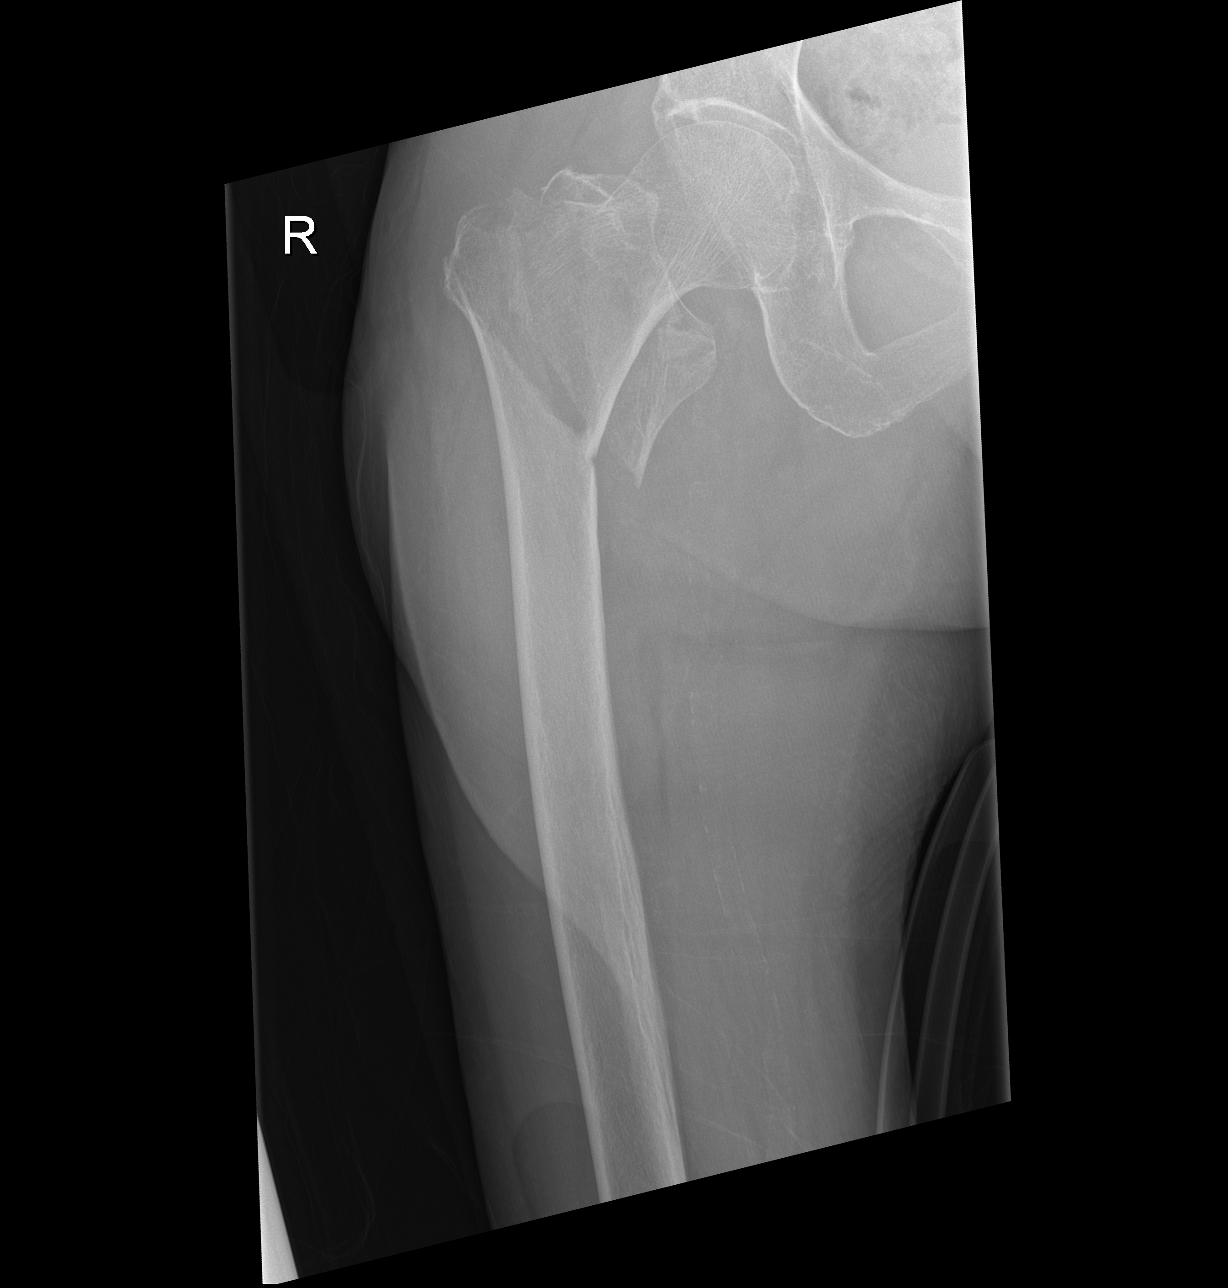

[t femur distal ap right]
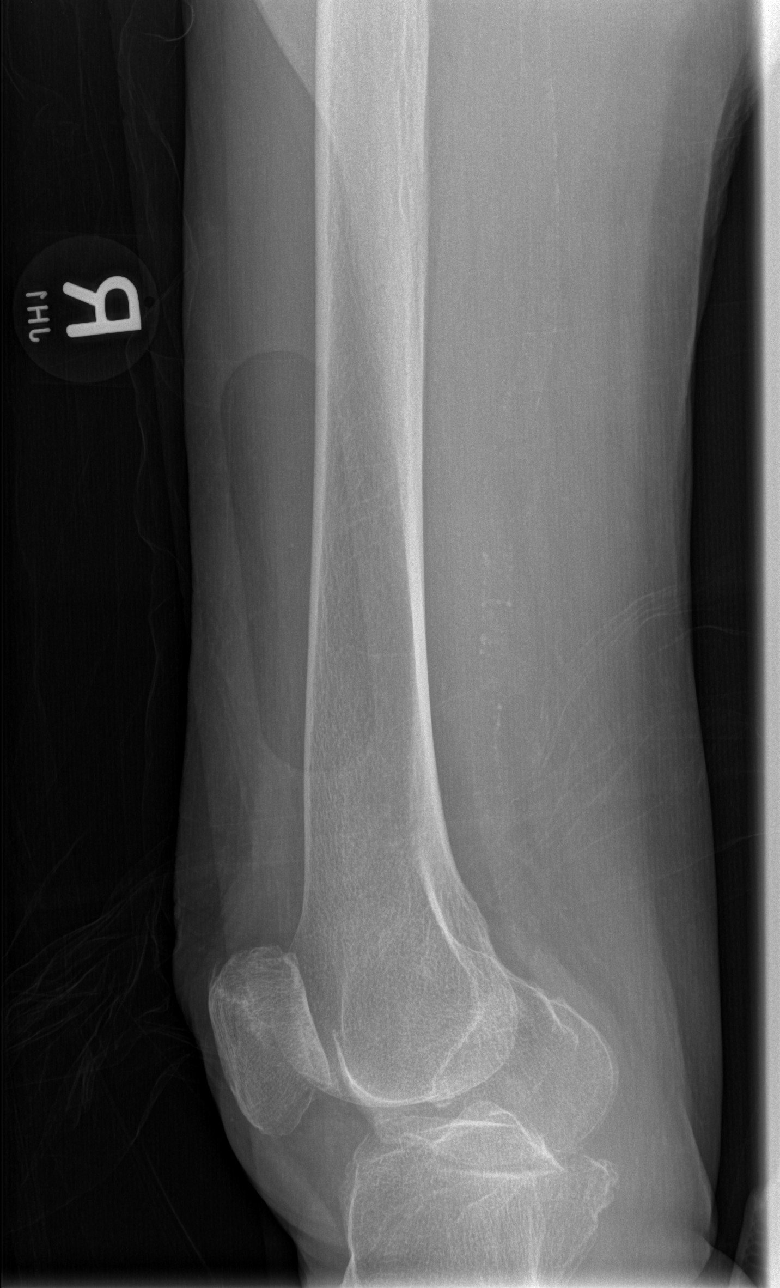

[x femur distal lat right]
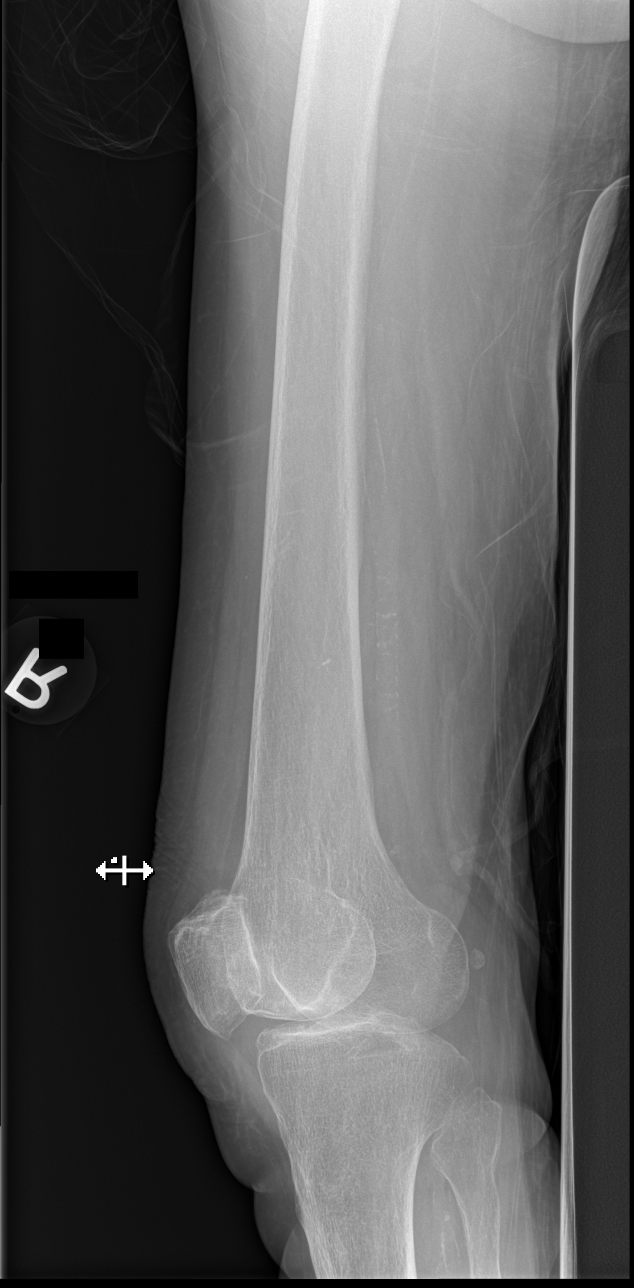

[w hip lat right]
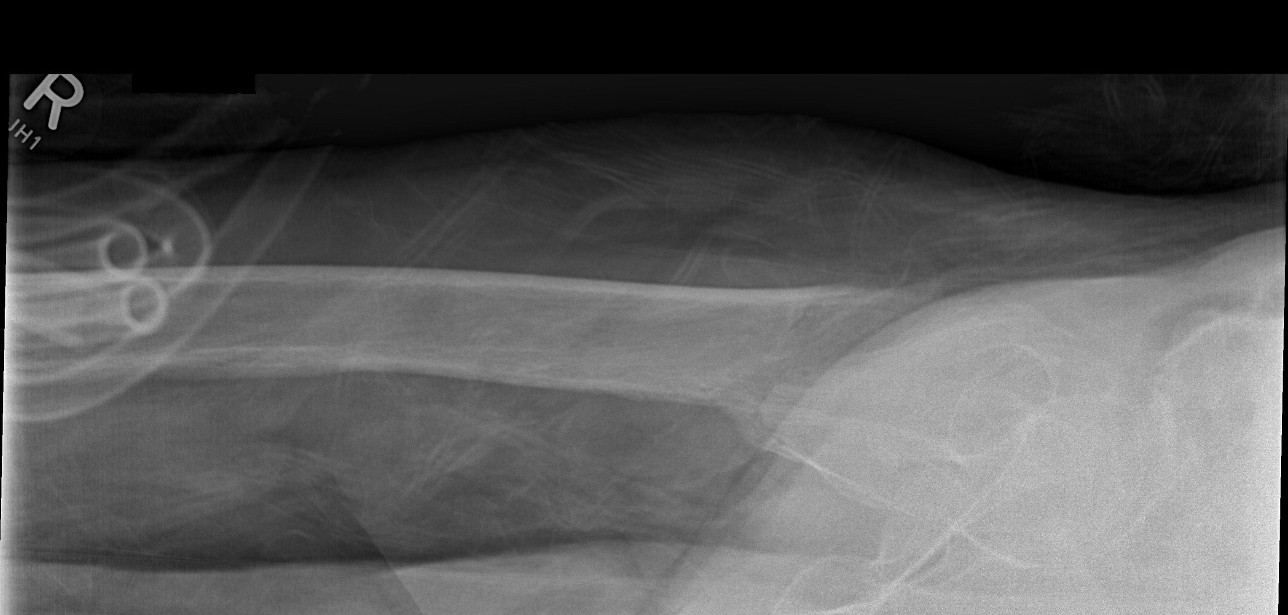

[4 of 4 positions shown; findings below may reference images not displayed]

FINDINGS: Acute intertrochanteric right femur fracture with mild varus
angulation. The hip is located. Osteopenia.
IMPRESSION: Acute intertrochanteric right femur fracture.

## 2019-07-01 IMAGING — CR DG CHEST 2V
2 series · 2 of 2 positions shown · non-contrast
Comparison: October 06, 2008

CLINICAL DATA: Fall.

EXAM:
CHEST - 2 VIEW

[t chest supine]
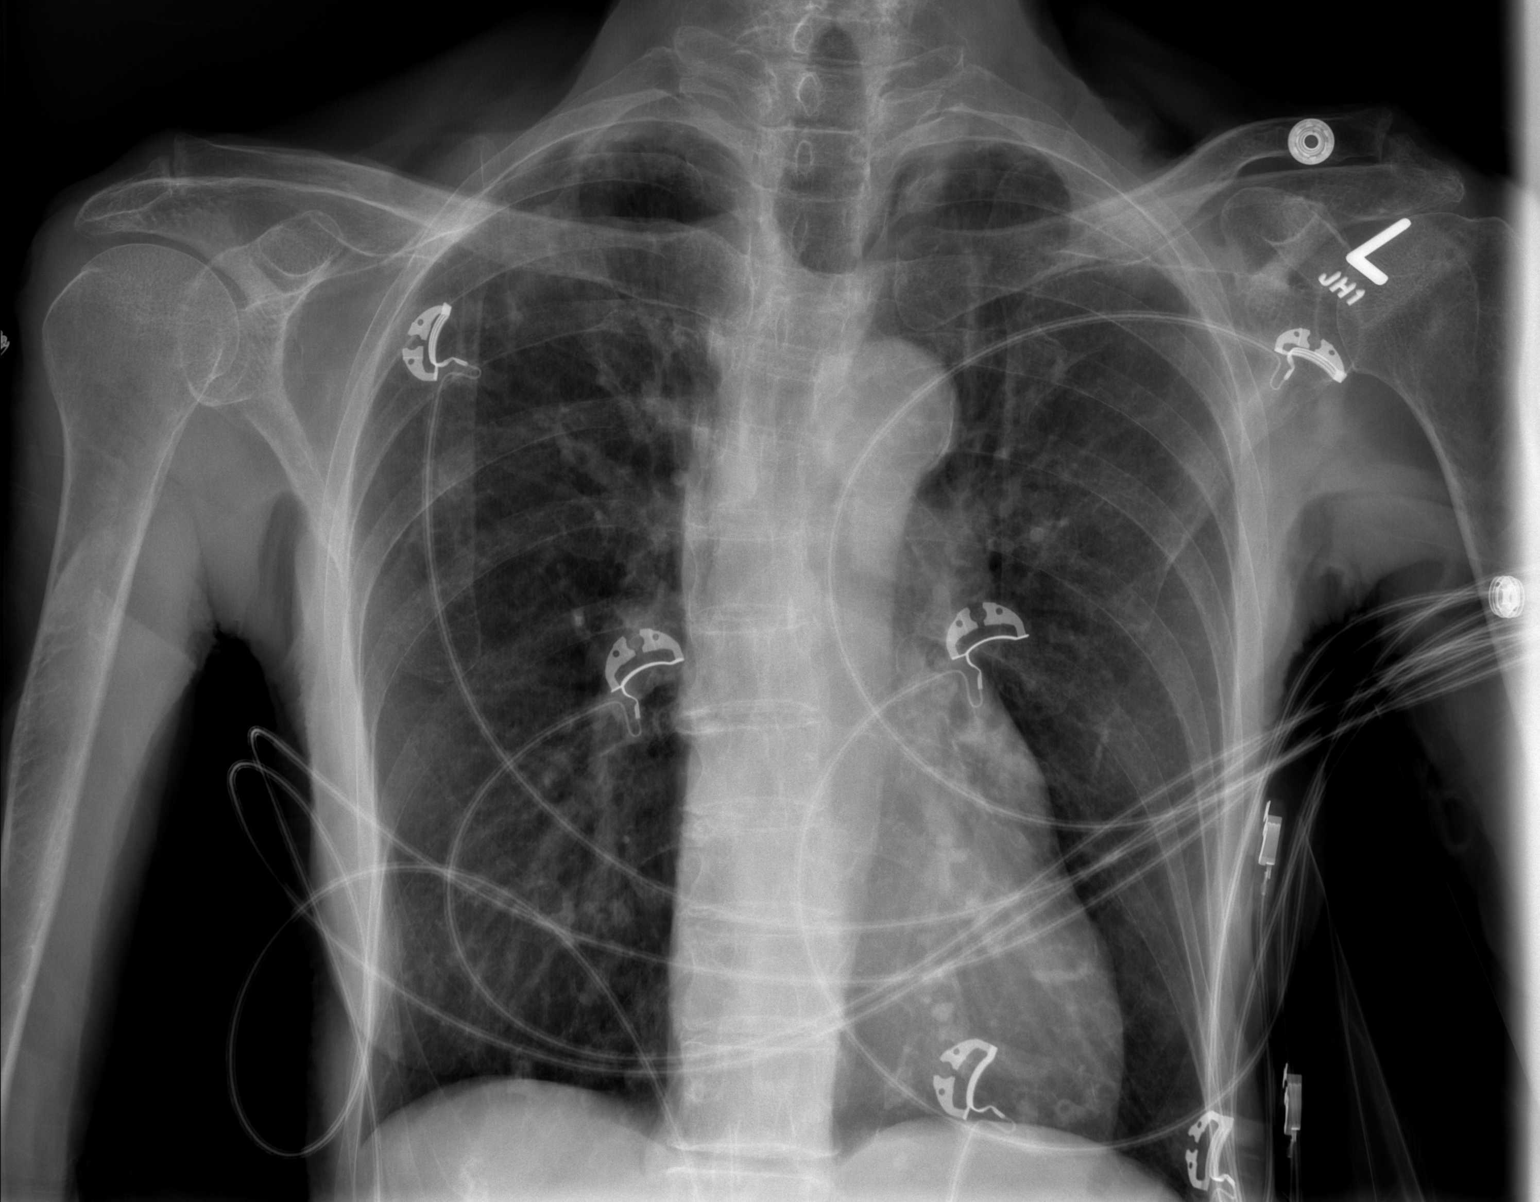

[w chest lat]
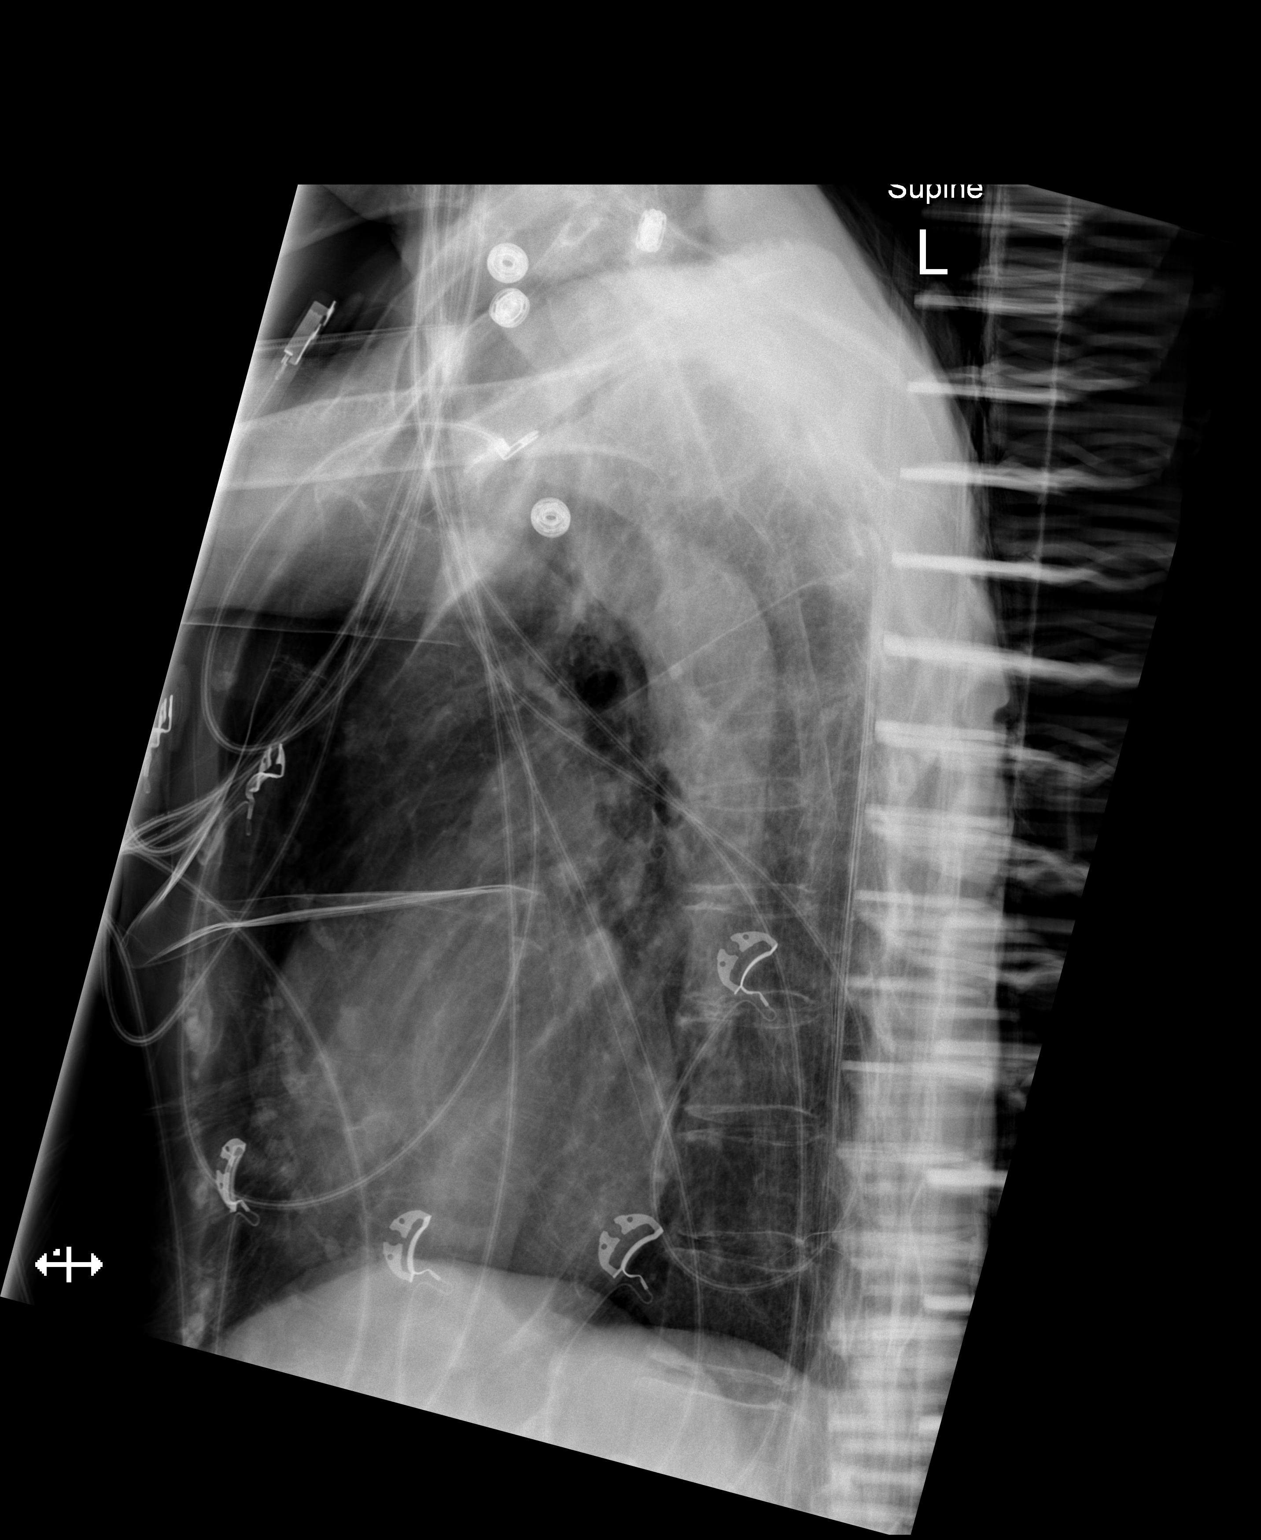

[2 of 2 positions shown; findings below may reference images not displayed]

FINDINGS: No pneumothorax. The heart, hila, mediastinum, lungs, and pleura are
normal. No other acute abnormalities.
IMPRESSION: No acute abnormalities.

## 2019-07-01 NOTE — Progress Notes (Signed)
Subjective:    Patient ID: [name], female    DOB: 1930/03/26, 85 y.o.   MRN: [id number]  HPI The patient is here for follow up from the hospital.  Admitted 11/30-12/4 for syncope  Recommendations for Outpatient Follow-up:  1. Follow up with PCP in 1-2 weeks 2. Follow up with Cardiology Dr. Eleonore Chiquito in 1-2 weeks 3. Please obtain CMP/CBC, Mag, Phos in one week  She went to the ED after fall x 2 with LOC.  She was watching TV and suddenly fell from the couch and her eyes rolled up.  There was no seizure, frothing from the mouth or tongue biting.  The episode lasted a few seconds.  She was w/in normal mentation after a few seconds.  Her BP was normal, her pulse was irregular from afib.  The second episode occurred when she was in the kitchen at 1230. She fell suddenly on the ground and hit her head.  Her son found her unresponsive.  The episode lasted a few seconds.  EMS was called.   In ED VSS, blood work WNL.  covid 19 neg.  Ct head and spine negative for acute findings.  Neuro consulted.   She had a couple of episodes of HR in 150-160's and cardiology was consulted.  Metoprolol dose increased.     Syncope Cause of syncope unknown EEG done - suggestive of non specific cortical dysfunction in left temporal region.  No seizures or epileptiform discharges seen.   IVF given Carotid dopplers 1-39% stenosis Orthostatic vitals unremarkable Blood work wnl CXR normal Ct head, c spine neg Echo  EF 50-55% Neuro consulted and signed off Cardio saw pt for elevated FR PT advised home PT, OT  Paroxysmal Afib with episodes of RVR Cardio consulted for elevated HR 150-160's Metoprolol increased to 37.5 mg BID Echo - EF 50-55%, LV normal size.  tsh normal, troponin flat apixaban 2.5 mg po bid Telemetry monitored  Hypertension: Stable Amlodipine d/c'd Metoprolol incraesd to 37.5 mg BID  H/o of T12 comprssion fx Tylenol prn S/p kyphoplasty  Metabolic acidosis Mild and  improved CO2 was 20, chloride 104, anion gap 10 Repeat improved  Hyponatremia Improved repleted with IVF  Hypophosphatemia IV sodium phos given   She had a few episodes of sundowning in the hosptial.  She is better since being home.  She is eating well and sleeping well.  She has had PT x 2, OT x 1.  She is dressing herself, fixing some meals.   Hypertension: She is taking her medication daily. She is compliant with a low sodium diet.  She denies chest pain, palpitations, edema, shortness of breath and regular headaches. Her son does monitor her blood pressure at home - 132/70, HR 80-88.    Syncope:  She has not had any presyncopal symptoms or syncope since leaving the hospital.  She denies lightheadedness or dizziness.     Medications and allergies reviewed with patient and updated if appropriate.  Patient Active Problem List   Diagnosis Date Noted  . Hypomagnesemia 07/01/2019  . Hypophosphatemia 07/01/2019  . Metabolic acidosis 99991111  . Atrial fibrillation with RVR ([place]) 06/19/2019  . DNR (do not resuscitate) 06/19/2019  . Syncope 06/17/2019  . Memory difficulties 12/21/2018  . Acute cystitis without hematuria 12/21/2018  . T12 compression fracture ([place]) 04/24/2018  . Femur fracture, right ([place]) 01/14/2018  . Macular degeneration (senile) of retina 06/14/2010  . Vitamin D deficiency 03/10/2009  . Osteoporosis 03/10/2009  . NEOPLASM, MALIGNANT,  BLADDER, HX OF 03/10/2009  . 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  . Prediabetes 06/26/2008  . Essential hypertension 08/09/2007  . Paroxysmal atrial fibrillation ([place]) 08/09/2007    Current Outpatient Medications on File Prior to Visit  Medication Sig Dispense Refill  . acetaminophen (TYLENOL) 500 MG tablet Take 500 mg by mouth daily as needed for moderate pain.     . calcium carbonate (OS-CAL) 600 MG TABS tablet Take 600 mg by mouth 2 (two) times daily.     . Cholecalciferol (VITAMIN D3 SUPER STRENGTH) 50 MCG (2000 UT) TABS Take  2,000 Units by mouth daily with lunch.    Marland Kitchen ELIQUIS 2.5 MG TABS tablet TAKE 1 TABLET BY MOUTH TWICE A DAY (Patient taking differently: Take 2.5 mg by mouth 2 (two) times daily. ) 60 tablet 5  . Magnesium 400 MG TABS Take 400 mg by mouth daily with lunch.    . metoprolol tartrate (LOPRESSOR) 50 MG tablet Take 1 tablet (50 mg total) by mouth 2 (two) times daily. 60 tablet 0  . multivitamin-lutein (OCUVITE-LUTEIN) CAPS capsule Take 1 capsule by mouth daily with lunch.     . Omega-3 Fatty Acids (FISH OIL) 1000 MG CAPS Take 1,000 mg by mouth daily with lunch.     . traMADol (ULTRAM) 50 MG tablet TAKE 1 TABLET BY MOUTH EVERY 8 HOURS AS NEEDED FOR MODERATE PAIN. 60 tablet 0  . tretinoin (RETIN-A) 0.05 % cream Apply 1 application topically daily.      No current facility-administered medications on file prior to visit.    Past Medical History:  Diagnosis Date  . Anemia   . Arthritis    "back" (01/16/2018)  . Atrial fibrillation ([place])   . Cancer [facility])    low grade papillary, most recent recurrence 2011,  Dr Karsten Ro  . 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 History of blood transfusion 01/15/2018   S/P OR  . Hypertension   . Hyponatremia 01/26/12   Na+ 131  . Macular degeneration, bilateral    "were wet; dry now" (01/16/2018)  . Other abnormal glucose 06/14/12   Fasting blood glucose 128; A1c 5.8% on 12/15/11    Past Surgical History:  Procedure Laterality Date  . ABDOMINAL HYSTERECTOMY  1970s   with oopherectomy for incidenctal cyst (no PMH of abnormal PAP)  . APPENDECTOMY     1970s  . BLADDER TUMOR EXCISION  2011 & 2013   Dr Karsten Ro  . CATARACT EXTRACTION, BILATERAL  2014   Dr Herbert Deaner  . COLONOSCOPY     negative; Dr Olevia Perches  . CYSTOSCOPY      multiple since tumor excision;Dr Ottelin  . DILATION AND CURETTAGE OF UTERUS    . FRACTURE SURGERY    . INTRAMEDULLARY (IM) NAIL INTERTROCHANTERIC Right 01/15/2018   Procedure: INTRAMEDULLARY (IM) NAIL INTERTROCHANTRIC;  Surgeon:  Melrose Nakayama, MD;  Location: [place];  Service: Orthopedics;  Laterality: Right;  . KYPHOPLASTY N/A 03/08/2018   Procedure: THORACIC 12 KYPHOPLASTY;  Surgeon: Phylliss Bob, MD;  Location: [place];  Service: Orthopedics;  Laterality: N/A;  . TONSILLECTOMY      Social History   Socioeconomic History  . Marital status: Widowed    Spouse name: Not on file  . Number of children: 2  . Years of education: Not on file  . Highest education level: Not on file  Occupational History  . Occupation: Retired  Tobacco Use  . Smoking status: Never Smoker  . Smokeless tobacco: Never Used  Substance and Sexual Activity  .  Alcohol use: Never    Alcohol/week: 0.0 standard drinks  . Drug use: Never  . Sexual activity: Never  Other Topics Concern  . Not on file  Social History Narrative  . Not on file   Social Determinants of Health   Financial Resource Strain: Low Risk   . Difficulty of Paying Living Expenses: Not hard at all  Food Insecurity: No Food Insecurity  . Worried About Charity fundraiser in the Last Year: Never true  . Ran Out of Food in the Last Year: Never true  Transportation Needs: No Transportation Needs  . Lack of Transportation (Medical): No  . Lack of Transportation (Non-Medical): No  Physical Activity: Insufficiently Active  . Days of Exercise per Week: 3 days  . Minutes of Exercise per Session: 30 min  Stress: No Stress Concern Present  . Feeling of Stress : Not at all  Social Connections: Slightly Isolated  . Frequency of Communication with Friends and Family: More than three times a week  . Frequency of Social Gatherings with Friends and Family: More than three times a week  . Attends Religious Services: More than 4 times per year  . Active Member of Clubs or Organizations: Yes  . Attends Archivist Meetings: More than 4 times per year  . Marital Status: Widowed    Family History  Problem Relation Age of Onset  . Stroke Mother 66  . Heart attack  Father 31  . Coronary artery disease Brother        S/P CBAG ? @ 60  . Diabetes Brother   . Kidney disease Neg Hx   . Cancer Neg Hx   . Colon cancer Neg Hx   . Colon polyps Neg Hx     Review of Systems  Constitutional: Negative for appetite change, chills and fever.  Respiratory: Negative for cough, shortness of breath and wheezing.   Cardiovascular: Negative for chest pain, palpitations and leg swelling.  Neurological: Negative for dizziness, weakness, light-headedness, numbness and headaches.       Objective:   Vitals:   07/02/19 1304  BP: 138/62  Pulse: 91  Resp: 14  Temp: 97.6 F (36.4 C)  SpO2: 99%   BP Readings from Last 3 Encounters:  07/02/19 138/62  06/21/19 121/79  05/06/19 (!) 142/78   Wt Readings from Last 3 Encounters:  07/02/19 101 lb 12.8 oz (46.2 kg)  05/06/19 102 lb (46.3 kg)  12/21/18 96 lb 1.9 oz (43.6 kg)   Body mass index is 17.47 kg/m.   Physical Exam    Constitutional: Appears well-developed and well-nourished. No distress.  HENT:  Head: Normocephalic and atraumatic.  Neck: Neck supple. No tracheal deviation present. No thyromegaly present.  No cervical lymphadenopathy Cardiovascular: Normal rate, irregular rhythm and normal heart sounds.   No murmur heard. No carotid bruit .  No edema Pulmonary/Chest: Effort normal and breath sounds normal. No respiratory distress. No has no wheezes. No rales.  Abdomen: soft, NT, ND Skin: Skin is warm and dry. Not diaphoretic.  Psychiatric: Normal mood and affect. Behavior is normal.   Lab Results  Component Value Date   WBC 6.8 06/21/2019   HGB 11.8 (L) 06/21/2019   HCT 36.0 06/21/2019   PLT 240 06/21/2019   GLUCOSE 129 (H) 06/21/2019   CHOL 209 (H) 01/24/2017   TRIG 68.0 01/24/2017   HDL 80.00 01/24/2017   LDLDIRECT 122.9 06/14/2010   LDLCALC 115 (H) 01/24/2017   ALT 17 06/21/2019   AST 15  06/21/2019   NA 135 06/21/2019   K 3.8 06/21/2019   CL 102 06/21/2019   CREATININE 0.70 06/21/2019    BUN 18 06/21/2019   CO2 22 06/21/2019   TSH 1.497 06/17/2019   INR 1.0 06/17/2019   HGBA1C 6.5 05/06/2019   MICROALBUR 2.4 (H) 07/04/2006    EEG adult Lora Havens, MD     06/18/2019  5:56 PM Patient Name: ALEXONDRIA SERNA  MRN: [id number]  Epilepsy Attending: Lora Havens  Referring Physician/Provider: Dr Lesia Sago Date: 06/18/2019 Duration: 23.42 mins  Patient history: 85yo F with syncope. EEG to evaluate for seizure  Level of alertness: awake  AEDs during EEG study: None  Technical aspects: This EEG study was done with scalp electrodes  positioned according to the 10-20 International system of  electrode placement. Electrical activity was acquired at a  sampling rate of 500Hz  and reviewed with a high frequency filter  of 70Hz  and a low frequency filter of 1Hz . EEG data were recorded  continuously and digitally stored.   DESCRIPTION: No clear posterior dominant rhythm was seen. EEG  showed intermittent 2-3hz  delta slowing in left temporal region.  Hyperventilation and photic stimulation were not performed.  ABNORMALITY - Intermittent slow, left temporal  IMPRESSION: This study is suggestive of non specific cortical dysfunction in  left temporal region.  No seizures or epileptiform discharges  were seen throughout the recording.  Priyanka O Yadav  VAS US CAROTID Carotid Arterial Duplex Study  Indications:       Syncope. Risk Factors:      Hypertension. Other Factors:     A-fib. Comparison Study:  No prior study  Performing Technologist: Maudry Mayhew MHA, RDMS, RVT, RDCS    Examination Guidelines: A complete evaluation includes B-mode imaging, spectral Doppler, color Doppler, and power Doppler as needed of all accessible portions of each vessel. Bilateral testing is considered an integral part of a complete examination. Limited examinations for reoccurring indications may be performed as noted.    Right Carotid  Findings: +----------+--------+--------+--------+------------------+------------------+           PSV cm/sEDV cm/sStenosisPlaque DescriptionComments           +----------+--------+--------+--------+------------------+------------------+ CCA Prox  67      11                                intimal thickening +----------+--------+--------+--------+------------------+------------------+ CCA Distal56      12                                                   +----------+--------+--------+--------+------------------+------------------+ ICA Prox  42      9                                 intimal thickening +----------+--------+--------+--------+------------------+------------------+ ICA Distal100     11                                                   +----------+--------+--------+--------+------------------+------------------+ ECA       92                                                           +----------+--------+--------+--------+------------------+------------------+  +----------+--------+-------+----------------+-------------------+  PSV cm/sEDV cmsDescribe        Arm Pressure (mmHG) +----------+--------+-------+----------------+-------------------+ Subclavian109            Multiphasic, WNL                    +----------+--------+-------+----------------+-------------------+  +---------+--------+--+--------+-+---------+ VertebralPSV cm/s26EDV cm/s8Antegrade +---------+--------+--+--------+-+---------+     Left Carotid Findings: +----------+--------+-------+--------+----------------------+------------------+           PSV cm/sEDV    StenosisPlaque Description    Comments                             cm/s                                                    +----------+--------+-------+--------+----------------------+------------------+ CCA Prox  59      10                                   intimal  thickening +----------+--------+-------+--------+----------------------+------------------+ CCA Distal82      12             smooth and                                                                heterogenous                             +----------+--------+-------+--------+----------------------+------------------+ ICA Prox  45      11             smooth and                                                                heterogenous                             +----------+--------+-------+--------+----------------------+------------------+ ICA Distal69      13                                                      +----------+--------+-------+--------+----------------------+------------------+ ECA       78      5                                                       +----------+--------+-------+--------+----------------------+------------------+  +----------+--------+--------+----------------+-------------------+           PSV cm/sEDV cm/sDescribe        Arm Pressure (mmHG) +----------+--------+--------+----------------+-------------------+  [phone]             Multiphasic, WNL                    +----------+--------+--------+----------------+-------------------+  +---------+--------+--+--------+--+---------+ VertebralPSV cm/s46EDV cm/s11Antegrade +---------+--------+--+--------+--+---------+     Summary: Right Carotid: Velocities in the right ICA are consistent with a 1-39% stenosis.  Left Carotid: Velocities in the left ICA are consistent with a 1-39% stenosis.  Vertebrals:  Bilateral vertebral arteries demonstrate antegrade flow. Subclavians: Normal flow hemodynamics were seen in bilateral subclavian              arteries.  *See table(s) above for measurements and observations.    Electronically signed by Deitra Mayo MD on 06/18/2019 at 5:40:30 PM.      Final   DG Chest Port 1 View CLINICAL DATA:   Syncope.  EXAM: PORTABLE CHEST 1 VIEW  COMPARISON:  03/29/2018  FINDINGS: Heart size is normal considering the AP portable technique. Pulmonary vascularity is normal. Aortic atherosclerosis.  Lungs are clear. No effusions. No acute bone abnormalities. Old right mid humerus fracture.  IMPRESSION: 1. No active disease. 2.  Aortic Atherosclerosis (ICD10-I70.0).  Electronically Signed   By: Lorriane Shire M.D.   On: 06/18/2019 14:13 ECHOCARDIOGRAM COMPLETE   ECHOCARDIOGRAM REPORT       Patient Name:   ISLAM SCHLACHTER Date of Exam: 06/18/2019 Medical Rec #:  [id number]      Height:       64.0 in Accession #:    [id number]     Weight:       102.0 lb Date of Birth:  [birth year]      BSA:          1.47 m Patient Age:    65 years       BP:           128/83 mmHg Patient Gender: F              HR:           100 bpm. Exam Location:  Inpatient  Procedure: 2D Echo  Indications:    Syncope 780.2 / R55   History:        Patient has no prior history of Echocardiogram examinations.                 Arrythmias:Atrial Fibrillation; Risk Factors:Hypertension.   Sonographer:    Darlina Sicilian RDCS Referring Phys: [id number] [place]   1. Left ventricular ejection fraction, by visual estimation, is 50 to 55%. The left ventricle has low normal function. Left ventricular septal wall thickness was mildly increased. There is no left ventricular hypertrophy.  2. Global right ventricle has mildly reduced systolic function.The right ventricular size is mildly enlarged. No increase in right ventricular wall thickness.  3. Left atrial size was normal.  4. Right atrial size was moderately dilated.  5. The pericardial effusion is lateral to the left ventricle.  6. Trivial pericardial effusion is present.  7. Moderate calcification of the mitral valve leaflet(s).  8. Moderate mitral annular calcification.  9. Moderate thickening of the mitral valve leaflet(s). 10. The mitral valve is  degenerative. Mild mitral valve regurgitation. 11. The tricuspid valve is normal in structure. Tricuspid valve regurgitation moderate. 12. The aortic valve is tricuspid. Aortic valve regurgitation is mild. sclerosis without stenosis. 13. There is Moderate calcification of the aortic valve. 14. There is Moderate thickening of the aortic valve. 15. The pulmonic valve was not well  visualized. Pulmonic valve regurgitation is mild. 16. Shadowing artifact normal diameter. 17. Normal pulmonary artery systolic pressure.  FINDINGS  Left Ventricle: Left ventricular ejection fraction, by visual estimation, is 50 to 55%. The left ventricle has low normal function. The left ventricular internal cavity size was the left ventricle is normal in size. There is no left ventricular  hypertrophy.  Right Ventricle: The right ventricular size is mildly enlarged. No increase in right ventricular wall thickness. Global RV systolic function is has mildly reduced systolic function. The tricuspid regurgitant velocity is 2.39 m/s, and with an assumed  right atrial pressure of 3 mmHg, the estimated right ventricular systolic pressure is normal at 25.9 mmHg.  Left Atrium: Left atrial size was normal in size.  Right Atrium: Right atrial size was moderately dilated  Pericardium: Trivial pericardial effusion is present. The pericardial effusion is lateral to the left ventricle.  Mitral Valve: The mitral valve is degenerative in appearance. There is moderate thickening of the mitral valve leaflet(s). There is moderate calcification of the mitral valve leaflet(s). Moderate mitral annular calcification. Mild mitral valve  regurgitation.  Tricuspid Valve: The tricuspid valve is normal in structure. Tricuspid valve regurgitation moderate.  Aortic Valve: The aortic valve is tricuspid. . There is Moderate thickening and Moderate calcification of the aortic valve. Aortic valve regurgitation is mild. Sclerosis without stenosis.  There is Moderate thickening of the aortic valve. Moderate  calcification.  Pulmonic Valve: The pulmonic valve was not well visualized. Pulmonic valve regurgitation is mild.  Aorta: The aortic root is normal in size and structure. Shadowing artifact normal diameter.  IAS/Shunts: No atrial level shunt detected by color flow Doppler.    LEFT VENTRICLE PLAX 2D LVIDd:         3.70 cm  Diastology LVIDs:         2.70 cm  LV e' lateral:   6.82 cm/s LV PW:         1.40 cm  LV E/e' lateral: 9.2 LV IVS:        1.30 cm  LV e' medial:    6.24 cm/s LVOT diam:     1.70 cm  LV E/e' medial:  10.1 LV SV:         31 ml LV SV Index:   21.67 LVOT Area:     2.27 cm    RIGHT VENTRICLE RV S prime:     13.80 cm/s  LEFT ATRIUM           Index       RIGHT ATRIUM           Index LA diam:      2.70 cm 1.84 cm/m  RA Area:     21.30 cm LA Vol (A4C): 29.9 ml 20.35 ml/m RA Volume:   69.30 ml  47.16 ml/m  AORTIC VALVE LVOT Vmax:   40.50 cm/s LVOT Vmean:  31.700 cm/s LVOT VTI:    0.081 m   AORTA Ao Root diam: 3.30 cm Ao Asc diam:  3.20 cm  MITRAL VALVE                       TRICUSPID VALVE MV Area (PHT): 5.62 cm            TR Peak grad:   22.9 mmHg MV PHT:        39.15 msec          TR Vmax:        257.00 cm/s MV  Decel Time: 135 msec MV E velocity: 62.70 cm/s 103 cm/s SHUNTS                                    Systemic VTI:  0.08 m                                    Systemic Diam: 1.70 cm    Jenkins Rouge MD Electronically signed by Jenkins Rouge MD Signature Date/Time: 06/18/2019/11:48:31 AM      Final     Assessment & Plan:    See Problem List for Assessment and Plan of chronic medical problems.    This visit occurred during the SARS-CoV-2 public health emergency.  Safety protocols were in place, including screening questions prior to the visit, additional usage of staff PPE, and extensive cleaning of exam room while observing appropriate contact time as indicated for disinfecting  solutions.

## 2019-07-01 NOTE — Patient Instructions (Addendum)
  Tests ordered today. Your results will be released to [place] (or called to you) after review.  If any changes need to be made, you will be notified at that same time.   Medications reviewed and updated.  Changes include :   none  You had your prolia injection today.     Please followup in 6 months

## 2019-07-02 ENCOUNTER — Other Ambulatory Visit (INDEPENDENT_AMBULATORY_CARE_PROVIDER_SITE_OTHER): Payer: Medicare Other

## 2019-07-02 ENCOUNTER — Ambulatory Visit (INDEPENDENT_AMBULATORY_CARE_PROVIDER_SITE_OTHER): Payer: Medicare Other | Admitting: Internal Medicine

## 2019-07-02 ENCOUNTER — Encounter: Payer: Self-pay | Admitting: Internal Medicine

## 2019-07-02 ENCOUNTER — Other Ambulatory Visit: Payer: Self-pay

## 2019-07-02 VITALS — BP 138/62 | HR 91 | Temp 97.6°F | Resp 14 | Ht 64.0 in | Wt 101.8 lb

## 2019-07-02 DIAGNOSIS — I480 Paroxysmal atrial fibrillation: Secondary | ICD-10-CM | POA: Diagnosis not present

## 2019-07-02 DIAGNOSIS — R55 Syncope and collapse: Secondary | ICD-10-CM

## 2019-07-02 DIAGNOSIS — Z8781 Personal history of (healed) traumatic fracture: Secondary | ICD-10-CM | POA: Diagnosis not present

## 2019-07-02 DIAGNOSIS — M810 Age-related osteoporosis without current pathological fracture: Secondary | ICD-10-CM

## 2019-07-02 DIAGNOSIS — I4891 Unspecified atrial fibrillation: Secondary | ICD-10-CM

## 2019-07-02 DIAGNOSIS — E785 Hyperlipidemia, unspecified: Secondary | ICD-10-CM | POA: Diagnosis not present

## 2019-07-02 DIAGNOSIS — M1990 Unspecified osteoarthritis, unspecified site: Secondary | ICD-10-CM | POA: Diagnosis not present

## 2019-07-02 DIAGNOSIS — D649 Anemia, unspecified: Secondary | ICD-10-CM | POA: Diagnosis not present

## 2019-07-02 DIAGNOSIS — Z79891 Long term (current) use of opiate analgesic: Secondary | ICD-10-CM | POA: Diagnosis not present

## 2019-07-02 DIAGNOSIS — Z7901 Long term (current) use of anticoagulants: Secondary | ICD-10-CM | POA: Diagnosis not present

## 2019-07-02 DIAGNOSIS — I10 Essential (primary) hypertension: Secondary | ICD-10-CM | POA: Diagnosis not present

## 2019-07-02 LAB — COMPREHENSIVE METABOLIC PANEL
ALT: 10 U/L (ref 0–35)
AST: 15 U/L (ref 0–37)
Albumin: 3.6 g/dL (ref 3.5–5.2)
Alkaline Phosphatase: 149 U/L — ABNORMAL HIGH (ref 39–117)
BUN: 18 mg/dL (ref 6–23)
CO2: 32 mEq/L (ref 19–32)
Calcium: 9.3 mg/dL (ref 8.4–10.5)
Chloride: 101 mEq/L (ref 96–112)
Creatinine, Ser: 0.87 mg/dL (ref 0.40–1.20)
GFR: 61.52 mL/min (ref >60.00)
Glucose, Bld: 153 mg/dL — ABNORMAL HIGH (ref 70–99)
Potassium: 4.2 mEq/L (ref 3.5–5.1)
Sodium: 138 mEq/L (ref 135–145)
Total Bilirubin: 0.4 mg/dL (ref 0.2–1.2)
Total Protein: 6.8 g/dL (ref 6.0–8.3)

## 2019-07-02 LAB — CBC WITH DIFFERENTIAL/PLATELET
Basophils Absolute: 0 10*3/uL (ref 0.0–0.1)
Basophils Relative: 0.2 % (ref 0.0–3.0)
Eosinophils Absolute: 0.1 10*3/uL (ref 0.0–0.7)
Eosinophils Relative: 1.5 % (ref 0.0–5.0)
HCT: 40.5 % (ref 36.0–46.0)
Hemoglobin: 13.1 g/dL (ref 12.0–15.0)
Lymphocytes Relative: 22.2 % (ref 12.0–46.0)
Lymphs Abs: 1.3 10*3/uL (ref 0.7–4.0)
MCHC: 32.4 g/dL (ref 30.0–36.0)
MCV: 94.6 fl (ref 78.0–100.0)
Monocytes Absolute: 0.6 10*3/uL (ref 0.1–1.0)
Monocytes Relative: 10.9 % (ref 3.0–12.0)
Neutro Abs: 3.7 10*3/uL (ref 1.4–7.7)
Neutrophils Relative %: 65.2 % (ref 43.0–77.0)
Platelets: 330 10*3/uL (ref 150.0–400.0)
RBC: 4.28 Mil/uL (ref 3.87–5.11)
RDW: 13.9 % (ref 11.5–15.5)
WBC: 5.7 10*3/uL (ref 4.0–10.5)

## 2019-07-02 LAB — PHOSPHORUS: Phosphorus: 3.2 mg/dL (ref 2.3–4.6)

## 2019-07-02 LAB — MAGNESIUM: Magnesium: 2.1 mg/dL (ref 1.5–2.5)

## 2019-07-02 IMAGING — RF DG C-ARM 61-120 MIN
1 series · 4 of 4 positions shown · non-contrast
Comparison: 01/14/2018

CLINICAL DATA: Right intratrochanteric fracture

EXAM:
RIGHT FEMUR 2 VIEWS; DG C-ARM 61-120 MIN

[Series 1: run · 4 of 4 slices shown]
[im 1/4]
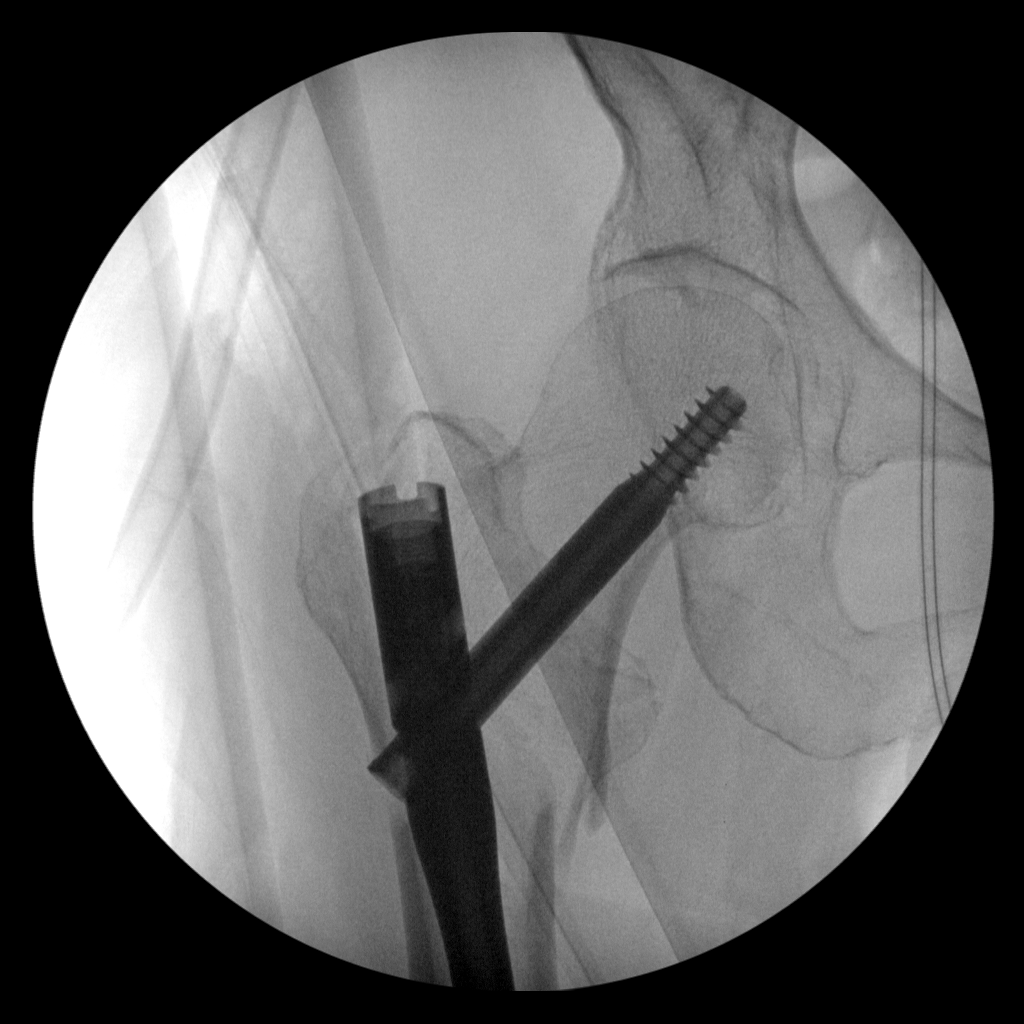
[im 2/4]
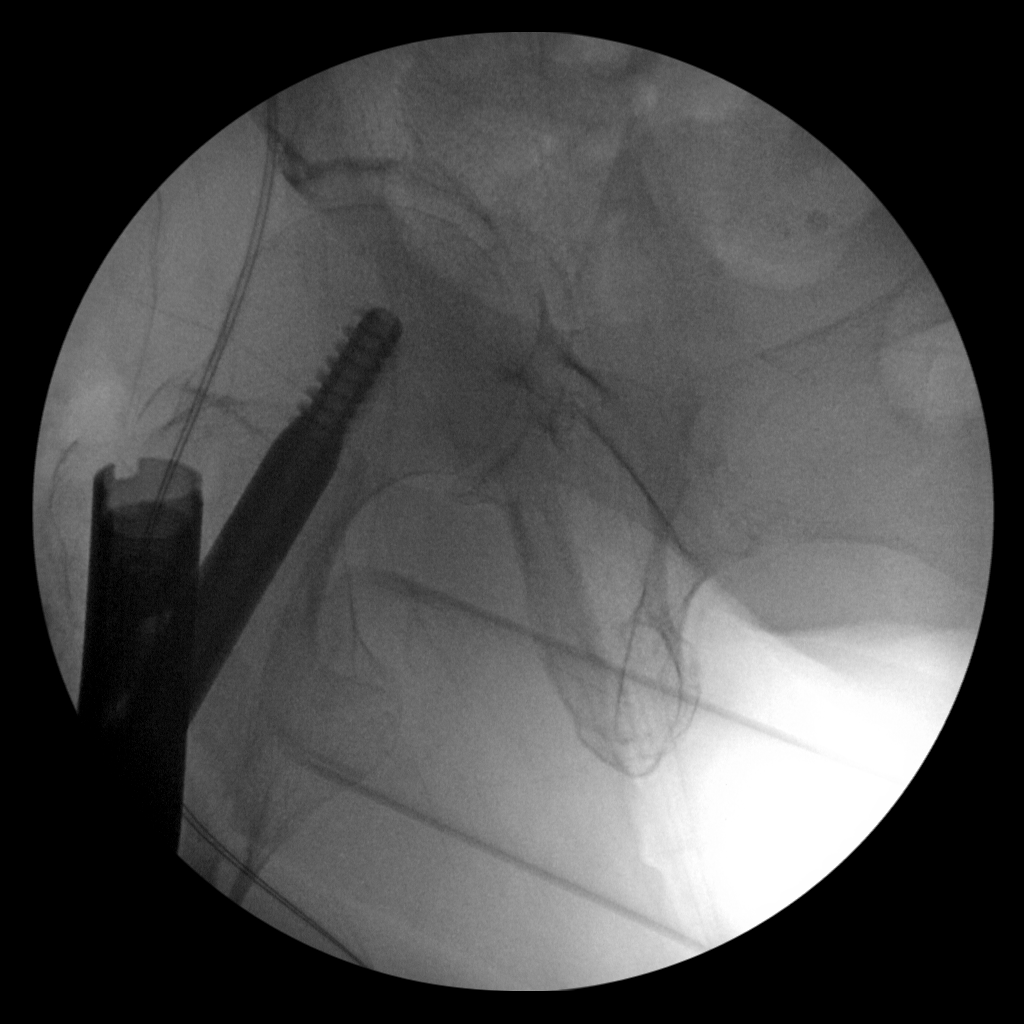
[im 3/4]
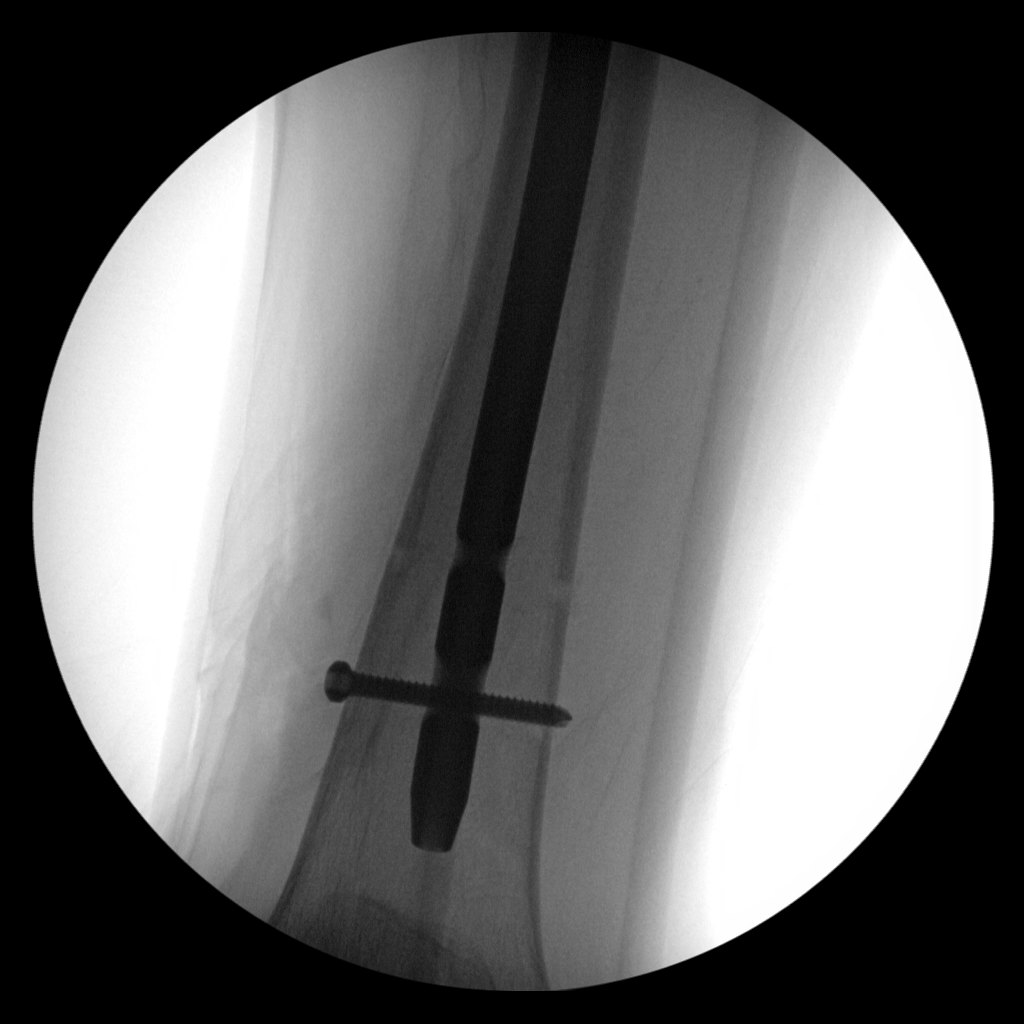
[im 4/4]
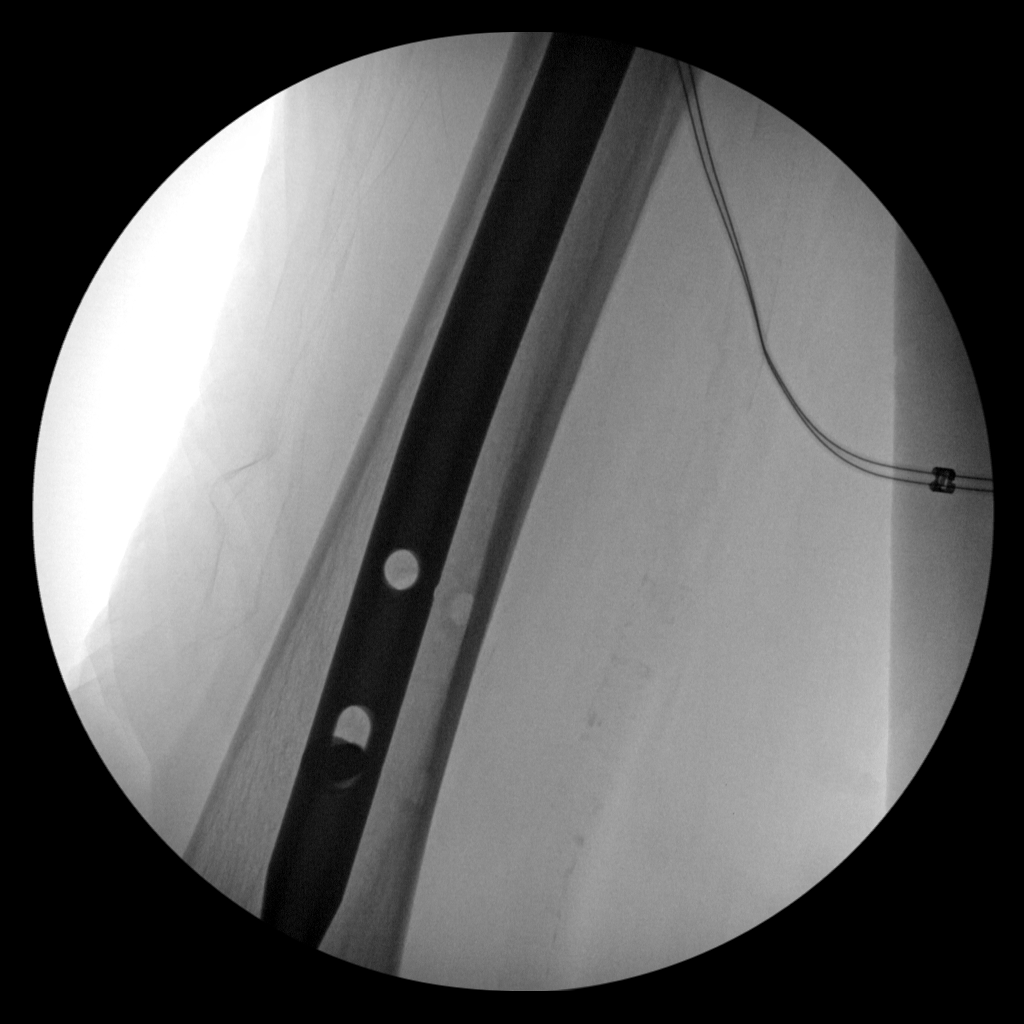

[4 of 4 positions shown; findings below may reference images not displayed]

FLUOROSCOPY TIME:  Radiation Exposure Index (as provided by the
fluoroscopic device): Not available

If the device does not provide the exposure index:

Fluoroscopy Time:  2 minutes 28 seconds

Number of Acquired Images:  For
FINDINGS: Medullary rod is noted within the right femur. Fixation screws noted
traversing the femoral neck. The fracture fragments are in near
anatomic alignment.
IMPRESSION: ORIF of proximal right femoral fracture.

## 2019-07-02 MED ORDER — DENOSUMAB 60 MG/ML ~~LOC~~ SOSY
60.0000 mg | PREFILLED_SYRINGE | Freq: Once | SUBCUTANEOUS | Status: AC
  Administered 2019-07-02: 60 mg via SUBCUTANEOUS

## 2019-07-02 NOTE — Assessment & Plan Note (Signed)
Asymptomatic management per cardio

## 2019-07-02 NOTE — Assessment & Plan Note (Signed)
repleted in hospital Check mag, phos, cmp, cbc

## 2019-07-02 NOTE — Assessment & Plan Note (Signed)
Good here and well controlled at home continue current medications

## 2019-07-02 NOTE — Assessment & Plan Note (Signed)
Due for prolia - given today Continue cal, vitamin d

## 2019-07-02 NOTE — Assessment & Plan Note (Signed)
?   Related to AFib Less likely orthostatic in nature Neuro w/u negative No recurrences, lightheadedness

## 2019-07-04 ENCOUNTER — Telehealth: Payer: Self-pay | Admitting: Internal Medicine

## 2019-07-04 DIAGNOSIS — E785 Hyperlipidemia, unspecified: Secondary | ICD-10-CM | POA: Diagnosis not present

## 2019-07-04 DIAGNOSIS — D649 Anemia, unspecified: Secondary | ICD-10-CM | POA: Diagnosis not present

## 2019-07-04 DIAGNOSIS — Z79891 Long term (current) use of opiate analgesic: Secondary | ICD-10-CM | POA: Diagnosis not present

## 2019-07-04 DIAGNOSIS — Z7901 Long term (current) use of anticoagulants: Secondary | ICD-10-CM | POA: Diagnosis not present

## 2019-07-04 DIAGNOSIS — I10 Essential (primary) hypertension: Secondary | ICD-10-CM | POA: Diagnosis not present

## 2019-07-04 DIAGNOSIS — M1990 Unspecified osteoarthritis, unspecified site: Secondary | ICD-10-CM | POA: Diagnosis not present

## 2019-07-04 DIAGNOSIS — R55 Syncope and collapse: Secondary | ICD-10-CM | POA: Diagnosis not present

## 2019-07-04 DIAGNOSIS — Z8781 Personal history of (healed) traumatic fracture: Secondary | ICD-10-CM | POA: Diagnosis not present

## 2019-07-04 DIAGNOSIS — I480 Paroxysmal atrial fibrillation: Secondary | ICD-10-CM | POA: Diagnosis not present

## 2019-07-04 NOTE — Telephone Encounter (Signed)
Called and left message for [name] with verbal ok.

## 2019-07-04 NOTE — Telephone Encounter (Signed)
[name] called from [facility] requesting verbal order to move her last visit from not next week but the following week. The last week of December the week of the 27th . [name] states office can leave a VM. Call back [phone]

## 2019-07-04 NOTE — Telephone Encounter (Signed)
ok 

## 2019-07-06 ENCOUNTER — Other Ambulatory Visit: Payer: Self-pay | Admitting: Internal Medicine

## 2019-07-08 NOTE — Telephone Encounter (Signed)
Check [place] registry last filled 06/20/2019.Marland KitchenJohny Chess

## 2019-07-10 DIAGNOSIS — R55 Syncope and collapse: Secondary | ICD-10-CM | POA: Diagnosis not present

## 2019-07-10 DIAGNOSIS — M1990 Unspecified osteoarthritis, unspecified site: Secondary | ICD-10-CM | POA: Diagnosis not present

## 2019-07-10 DIAGNOSIS — I480 Paroxysmal atrial fibrillation: Secondary | ICD-10-CM | POA: Diagnosis not present

## 2019-07-10 DIAGNOSIS — Z7901 Long term (current) use of anticoagulants: Secondary | ICD-10-CM | POA: Diagnosis not present

## 2019-07-10 DIAGNOSIS — D649 Anemia, unspecified: Secondary | ICD-10-CM | POA: Diagnosis not present

## 2019-07-10 DIAGNOSIS — Z79891 Long term (current) use of opiate analgesic: Secondary | ICD-10-CM | POA: Diagnosis not present

## 2019-07-10 DIAGNOSIS — Z8781 Personal history of (healed) traumatic fracture: Secondary | ICD-10-CM | POA: Diagnosis not present

## 2019-07-10 DIAGNOSIS — E785 Hyperlipidemia, unspecified: Secondary | ICD-10-CM | POA: Diagnosis not present

## 2019-07-10 DIAGNOSIS — I10 Essential (primary) hypertension: Secondary | ICD-10-CM | POA: Diagnosis not present

## 2019-07-16 ENCOUNTER — Telehealth: Payer: Self-pay

## 2019-07-16 DIAGNOSIS — Z8781 Personal history of (healed) traumatic fracture: Secondary | ICD-10-CM | POA: Diagnosis not present

## 2019-07-16 DIAGNOSIS — R55 Syncope and collapse: Secondary | ICD-10-CM | POA: Diagnosis not present

## 2019-07-16 DIAGNOSIS — D649 Anemia, unspecified: Secondary | ICD-10-CM | POA: Diagnosis not present

## 2019-07-16 DIAGNOSIS — I10 Essential (primary) hypertension: Secondary | ICD-10-CM | POA: Diagnosis not present

## 2019-07-16 DIAGNOSIS — E785 Hyperlipidemia, unspecified: Secondary | ICD-10-CM | POA: Diagnosis not present

## 2019-07-16 DIAGNOSIS — Z7901 Long term (current) use of anticoagulants: Secondary | ICD-10-CM | POA: Diagnosis not present

## 2019-07-16 DIAGNOSIS — Z79891 Long term (current) use of opiate analgesic: Secondary | ICD-10-CM | POA: Diagnosis not present

## 2019-07-16 DIAGNOSIS — M1990 Unspecified osteoarthritis, unspecified site: Secondary | ICD-10-CM | POA: Diagnosis not present

## 2019-07-16 DIAGNOSIS — I480 Paroxysmal atrial fibrillation: Secondary | ICD-10-CM | POA: Diagnosis not present

## 2019-07-16 NOTE — Telephone Encounter (Signed)
Copied from [place] [phone]. Topic: General - Call Back - No Documentation >> Jul 16, 2019 12:49 PM Erick Blinks wrote: Reason for CRM: Pt's son Starkesha Dare called requesting a call back from Nurse. Best contact: [phone]

## 2019-07-18 MED ORDER — HYDROCODONE-ACETAMINOPHEN 5-325 MG PO TABS: 1.0000 | ORAL_TABLET | ORAL | 0 refills | Status: DC | PRN

## 2019-07-18 MED ORDER — METOPROLOL TARTRATE 50 MG PO TABS: 50.0000 mg | ORAL_TABLET | Freq: Two times a day (BID) | ORAL | 1 refills | Status: DC

## 2019-07-18 NOTE — Telephone Encounter (Signed)
Hydrocodone and metoprolol sent to pharmacy.

## 2019-07-18 NOTE — Telephone Encounter (Signed)
Patient has seen dr burns on 12/15 for hospital follow up---her metoprolol was increased by hospitalist, and she is now needing refill on metoprolol----routing to dr burns, are you ok with sending in increased dosage of metoprolol--patient is almost completely out of this med----also patient has been taking tramadol for fracture pain, but she is still hurting, patient is requesting if she could use hydrocodone for a couple of weeks (has used in the past with no complications) to see if this will control her pain better---routing to dr burns, are you ok with sending in rx for hydrocodone?  Please advise, thanks---patient is aware that we are out of office until Monday, jan 4th

## 2019-07-19 NOTE — Progress Notes (Signed)
Cardiology Office Note:   Date:  07/22/2019  NAME:  [name]    MRN: [id number] DOB:  09-Feb-1930   PCP:  Binnie Rail, MD  Cardiologist:  Evalina Field, MD   Referring MD: Binnie Rail, MD   Chief Complaint  Patient presents with  . Atrial Fibrillation   History of Present Illness:   [name] is a 86 y.o. female with a hx of atrial fibrillation, hypertension, dementia who is being seen today for the evaluation of atrial fibrillation at the request of Binnie Rail, MD. Was admitted 06/17/2019-06/21/2019 for syncope. Unclear etiology but likely orthostatic but really difficult to obtain history due to dementia. Seen by PCP and heart rate ~90.  She reports no further episodes of syncope.  She has been rate controlled on metoprolol.  Tolerating Eliquis well without any issues.  She presents with her son.  She reports that she has had an adequate hydration recently.  She only drinks 2 glasses of water daily.  She does consume tea and coffee.  She is going to work on this.  No further episodes of syncope.  Per her son's report, when she did have her syncopal episode she had poor p.o. intake.  She did get up while going to make lunch and fell in the kitchen.  She reports she was out for 30 to 40 seconds but then came to very quickly.  She did have lacerations as noted during that hospitalization.  She had no further episodes of this.  She is also been stopped of her blood pressure medication amlodipine since and as well.  She is mainly limited to activity around the house but has no limitations with this.  No chest pain, shortness of breath, lower extremity edema reported.  She does have venous insufficiency but no major issues.  No history of myocardial infarction or stroke.  She does not smoke or consume alcohol.  Regarding her atrial fibrillation, it appears this was new onset in the admission.  She is done well with rate control strategy.  Given her advanced age and dementia, I continue  to recommend a rate control strategy for her.  Problem List 1. Persistent atrial fibrillation on eliquis (diagnosed 05/2019 in setting of fall) 2. Hypertension 3. Dementia   Past Medical History: Past Medical History:  Diagnosis Date  . Anemia   . Arthritis    "back" (01/16/2018)  . Atrial fibrillation ([place])   . Cancer [name])    low grade papillary, most recent recurrence 2011,  Dr Karsten Ro  . 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 History of blood transfusion 01/15/2018   S/P OR  . Hypertension   . Hyponatremia 01/26/12   Na+ 131  . Macular degeneration, bilateral    "were wet; dry now" (01/16/2018)  . Other abnormal glucose 06/14/12   Fasting blood glucose 128; A1c 5.8% on 12/15/11    Past Surgical History: Past Surgical History:  Procedure Laterality Date  . ABDOMINAL HYSTERECTOMY  1970s   with oopherectomy for incidenctal cyst (no PMH of abnormal PAP)  . APPENDECTOMY     1970s  . BLADDER TUMOR EXCISION  2011 & 2013   Dr Karsten Ro  . CATARACT EXTRACTION, BILATERAL  2014   Dr Herbert Deaner  . COLONOSCOPY     negative; Dr Olevia Perches  . CYSTOSCOPY      multiple since tumor excision;Dr Ottelin  . DILATION AND CURETTAGE OF UTERUS    . FRACTURE SURGERY    .  INTRAMEDULLARY (IM) NAIL INTERTROCHANTERIC Right 01/15/2018   Procedure: INTRAMEDULLARY (IM) NAIL INTERTROCHANTRIC;  Surgeon: Melrose Nakayama, MD;  Location: [place];  Service: Orthopedics;  Laterality: Right;  . KYPHOPLASTY N/A 03/08/2018   Procedure: THORACIC 12 KYPHOPLASTY;  Surgeon: Phylliss Bob, MD;  Location: [place];  Service: Orthopedics;  Laterality: N/A;  . TONSILLECTOMY      Current Medications: Current Meds  Medication Sig  . acetaminophen (TYLENOL) 500 MG tablet Take 500 mg by mouth daily as needed for moderate pain.   Marland Kitchen apixaban (ELIQUIS) 2.5 MG TABS tablet Take 1 tablet (2.5 mg total) by mouth 2 (two) times daily.  . calcium carbonate (OS-CAL) 600 MG TABS tablet Take 600 mg by mouth 2 (two) times daily.     . Cholecalciferol (VITAMIN D3 SUPER STRENGTH) 50 MCG (2000 UT) TABS Take 2,000 Units by mouth daily with lunch.  Marland Kitchen HYDROcodone-acetaminophen (NORCO/VICODIN) 5-325 MG tablet Take 1 tablet by mouth every 4 (four) hours as needed for up to 5 days for moderate pain.  . Magnesium 400 MG TABS Take 400 mg by mouth daily with lunch.  . metoprolol tartrate (LOPRESSOR) 50 MG tablet Take 1 tablet (50 mg total) by mouth 2 (two) times daily.  . multivitamin-lutein (OCUVITE-LUTEIN) CAPS capsule Take 1 capsule by mouth daily with lunch.   . Omega-3 Fatty Acids (FISH OIL) 1000 MG CAPS Take 1,000 mg by mouth daily with lunch.   . traMADol (ULTRAM) 50 MG tablet TAKE 1 TABLET BY MOUTH EVERY 8 HOURS AS NEEDED FOR MODERATE PAIN.  Marland Kitchen tretinoin (RETIN-A) 0.05 % cream Apply 1 application topically daily.   . [DISCONTINUED] ELIQUIS 2.5 MG TABS tablet TAKE 1 TABLET BY MOUTH TWICE A DAY (Patient taking differently: Take 2.5 mg by mouth 2 (two) times daily. )  . [DISCONTINUED] metoprolol tartrate (LOPRESSOR) 50 MG tablet Take 1 tablet (50 mg total) by mouth 2 (two) times daily.     Allergies:    Patient has no known allergies.   Social History: Social History   Socioeconomic History  . Marital status: Widowed    Spouse name: Not on file  . Number of children: 2  . Years of education: Not on file  . Highest education level: Not on file  Occupational History  . Occupation: Retired  Tobacco Use  . Smoking status: Never Smoker  . Smokeless tobacco: Never Used  Substance and Sexual Activity  . Alcohol use: Never    Alcohol/week: 0.0 standard drinks  . Drug use: Never  . Sexual activity: Never  Other Topics Concern  . Not on file  Social History Narrative  . Not on file   Social Determinants of Health   Financial Resource Strain: Low Risk   . Difficulty of Paying Living Expenses: Not hard at all  Food Insecurity: No Food Insecurity  . Worried About Charity fundraiser in the Last Year: Never true  . Ran  Out of Food in the Last Year: Never true  Transportation Needs: No Transportation Needs  . Lack of Transportation (Medical): No  . Lack of Transportation (Non-Medical): No  Physical Activity: Insufficiently Active  . Days of Exercise per Week: 3 days  . Minutes of Exercise per Session: 30 min  Stress: No Stress Concern Present  . Feeling of Stress : Not at all  Social Connections: Slightly Isolated  . Frequency of Communication with Friends and Family: More than three times a week  . Frequency of Social Gatherings with Friends and Family: More than three  times a week  . Attends Religious Services: More than 4 times per year  . Active Member of Clubs or Organizations: Yes  . Attends Archivist Meetings: More than 4 times per year  . Marital Status: Widowed    Family History: The patient's family history includes Coronary artery disease in her brother; Diabetes in her brother; Heart attack (age of onset: 23) in her father; Hypertension in her brother; Stroke (age of onset: 91) in her mother. There is no history of Kidney disease, Cancer, Colon cancer, or Colon polyps.  ROS:   All other ROS reviewed and negative. Pertinent positives noted in the HPI.     EKGs/Labs/Other Studies Reviewed:   The following studies were personally reviewed by me today:  EKG:  EKG is  ordered today.  The ekg ordered today demonstrates atrial fibrillation, heart rate 100, left anterior fascicular block noted, no acute ST-T changes, anteroseptal infarct, and was personally reviewed by me.   TTE 06/18/2019  1. Left ventricular ejection fraction, by visual estimation, is 50 to 55%. The left ventricle has low normal function. Left ventricular septal wall thickness was mildly increased. There is no left ventricular hypertrophy.  2. Global right ventricle has mildly reduced systolic function.The right ventricular size is mildly enlarged. No increase in right ventricular wall thickness.  3. Left atrial size  was normal.  4. Right atrial size was moderately dilated.  5. The pericardial effusion is lateral to the left ventricle.  6. Trivial pericardial effusion is present.  7. Moderate calcification of the mitral valve leaflet(s).  8. Moderate mitral annular calcification.  9. Moderate thickening of the mitral valve leaflet(s). 10. The mitral valve is degenerative. Mild mitral valve regurgitation. 11. The tricuspid valve is normal in structure. Tricuspid valve regurgitation moderate. 12. The aortic valve is tricuspid. Aortic valve regurgitation is mild. sclerosis without stenosis. 13. There is Moderate calcification of the aortic valve. 14. There is Moderate thickening of the aortic valve. 15. The pulmonic valve was not well visualized. Pulmonic valve regurgitation is mild. 16. Shadowing artifact normal diameter. 17. Normal pulmonary artery systolic pressure.  Recent Labs: 06/17/2019: TSH 1.497 07/02/2019: ALT 10; BUN 18; Creatinine, Ser 0.87; Hemoglobin 13.1; Magnesium 2.1; Platelets 330.0; Potassium 4.2; Sodium 138   Recent Lipid Panel    Component Value Date/Time   CHOL 209 (H) 01/24/2017 0956   TRIG 68.0 01/24/2017 0956   HDL 80.00 01/24/2017 0956   CHOLHDL 3 01/24/2017 0956   VLDL 13.6 01/24/2017 0956   LDLCALC 115 (H) 01/24/2017 0956   LDLDIRECT 122.9 06/14/2010 0912    Physical Exam:   VS:  BP (!) 142/82   Pulse 100   Ht 5\' 4"  (1.626 m)   Wt 102 lb (46.3 kg)   SpO2 100%   BMI 17.51 kg/m    Wt Readings from Last 3 Encounters:  07/22/19 102 lb (46.3 kg)  07/02/19 101 lb 12.8 oz (46.2 kg)  05/06/19 102 lb (46.3 kg)    General: Well nourished, well developed, in no acute distress Heart: Atraumatic, normal size  Eyes: PEERLA, EOMI  Neck: Supple, no JVD Endocrine: No thryomegaly Cardiac: Irregular rhythm, no murmurs rubs or gallops Lungs: Clear to auscultation bilaterally, no wheezing, rhonchi or rales  Abd: Soft, nontender, no hepatomegaly  Ext: Venous insufficiency  changes Musculoskeletal: No deformities, BUE and BLE strength normal and equal Skin: Warm and dry, no rashes   Neuro: Alert and oriented to person, place, time, and situation, CNII-XII grossly intact, no focal  deficits  Psych: Normal mood and affect   ASSESSMENT:   [name] is a 86 y.o. female who presents for the following: 1. Persistent atrial fibrillation ([place])   2. Essential hypertension   3. Venous insufficiency     PLAN:   1. Persistent atrial fibrillation ([place]) -Diagnosed in November 2020 in the setting of a fall.  The fall likely was related to a orthostasis and poor p.o. intake.  Due to her dementia and advanced age, I recommended rate control strategy.  She is doing well today without any issues.  Echocardiogram at that time demonstrated no significant valvular heart disease.  She has low normal left ventricular function. -She continues to do well we will continue with rate control -Metoprolol 50 mg twice daily -Continue Eliquis 2.5 mg twice daily  2. Essential hypertension -Acceptable today  3. Venous insufficiency -Noted on exam, will pursue conservative therapy with leg elevation as well as compression stockings if needed   Disposition: Return in about 6 months (around 01/19/2020).  Medication Adjustments/Labs and Tests Ordered: Current medicines are reviewed at length with the patient today.  Concerns regarding medicines are outlined above.  Orders Placed This Encounter  Procedures  . EKG 12-Lead   Meds ordered this encounter  Medications  . apixaban (ELIQUIS) 2.5 MG TABS tablet    Sig: Take 1 tablet (2.5 mg total) by mouth 2 (two) times daily.    Dispense:  60 tablet    Refill:  5  . metoprolol tartrate (LOPRESSOR) 50 MG tablet    Sig: Take 1 tablet (50 mg total) by mouth 2 (two) times daily.    Dispense:  180 tablet    Refill:  1    Patient Instructions  Medication Instructions:  Your physician recommends that you continue on your current  medications as directed. Please refer to the Current Medication list given to you today.  If you need a refill on your cardiac medications before your next appointment, please call your pharmacy.   Lab work: NONE  Testing/Procedures: NONE  Follow-Up: At [facility], you and your health needs are our priority.  As part of our continuing mission to provide you with exceptional heart care, we have created designated Provider Care Teams.  These Care Teams include your primary Cardiologist (physician) and Advanced Practice Providers (APPs -  Physician Assistants and Nurse Practitioners) who all work together to provide you with the care you need, when you need it. You may see Dr Audie Box or one of the following Advanced Practice Providers on your designated Care Team:    Almyra Deforest, PA-C  Fabian Sharp, [place] or   Roby Lofts, [place]  Your physician wants you to follow-up in: 6 months with Dr Audie Box       Signed, Addison Naegeli. Audie Box, [place]  [address] [place], [place] [address] [phone]  07/22/2019 11:09 AM

## 2019-07-22 ENCOUNTER — Encounter: Payer: Self-pay | Admitting: Cardiovascular Disease

## 2019-07-22 ENCOUNTER — Ambulatory Visit: Payer: Medicare Other | Admitting: Cardiovascular Disease

## 2019-07-22 ENCOUNTER — Other Ambulatory Visit: Payer: Self-pay

## 2019-07-22 VITALS — BP 142/82 | HR 100 | Ht 64.0 in | Wt 102.0 lb

## 2019-07-22 DIAGNOSIS — I10 Essential (primary) hypertension: Secondary | ICD-10-CM

## 2019-07-22 DIAGNOSIS — I872 Venous insufficiency (chronic) (peripheral): Secondary | ICD-10-CM | POA: Diagnosis not present

## 2019-07-22 DIAGNOSIS — I4819 Other persistent atrial fibrillation: Secondary | ICD-10-CM

## 2019-07-22 MED ORDER — METOPROLOL TARTRATE 50 MG PO TABS: 50.0000 mg | ORAL_TABLET | Freq: Two times a day (BID) | ORAL | 1 refills | Status: DC

## 2019-07-22 MED ORDER — APIXABAN 2.5 MG PO TABS: 2.5000 mg | ORAL_TABLET | Freq: Two times a day (BID) | ORAL | 5 refills | Status: DC

## 2019-07-22 NOTE — Patient Instructions (Signed)
Medication Instructions:  Your physician recommends that you continue on your current medications as directed. Please refer to the Current Medication list given to you today.  If you need a refill on your cardiac medications before your next appointment, please call your pharmacy.   Lab work: NONE  Testing/Procedures: NONE  Follow-Up: At [facility], you and your health needs are our priority.  As part of our continuing mission to provide you with exceptional heart care, we have created designated Provider Care Teams.  These Care Teams include your primary Cardiologist (physician) and Advanced Practice Providers (APPs -  Physician Assistants and Nurse Practitioners) who all work together to provide you with the care you need, when you need it. You may see Dr Audie Box or one of the following Advanced Practice Providers on your designated Care Team:    Almyra Deforest, PA-C  Fabian Sharp, [place] or   Roby Lofts, [place]  Your physician wants you to follow-up in: 6 months with Dr Audie Box

## 2019-08-12 ENCOUNTER — Encounter: Payer: Self-pay | Admitting: *Deleted

## 2019-08-12 NOTE — Telephone Encounter (Signed)
This encounter was created in error - please disregard.

## 2019-08-13 DIAGNOSIS — Z7901 Long term (current) use of anticoagulants: Secondary | ICD-10-CM

## 2019-08-13 DIAGNOSIS — I480 Paroxysmal atrial fibrillation: Secondary | ICD-10-CM

## 2019-08-13 DIAGNOSIS — D649 Anemia, unspecified: Secondary | ICD-10-CM

## 2019-08-13 DIAGNOSIS — E785 Hyperlipidemia, unspecified: Secondary | ICD-10-CM

## 2019-08-13 DIAGNOSIS — R55 Syncope and collapse: Secondary | ICD-10-CM

## 2019-08-13 DIAGNOSIS — F0390 Unspecified dementia without behavioral disturbance: Secondary | ICD-10-CM

## 2019-08-13 DIAGNOSIS — Z79891 Long term (current) use of opiate analgesic: Secondary | ICD-10-CM

## 2019-08-13 DIAGNOSIS — M1990 Unspecified osteoarthritis, unspecified site: Secondary | ICD-10-CM

## 2019-08-13 DIAGNOSIS — Z8781 Personal history of (healed) traumatic fracture: Secondary | ICD-10-CM

## 2019-08-13 DIAGNOSIS — I10 Essential (primary) hypertension: Secondary | ICD-10-CM

## 2019-08-14 ENCOUNTER — Telehealth: Payer: Self-pay | Admitting: Internal Medicine

## 2019-08-14 MED ORDER — TRAMADOL HCL 50 MG PO TABS: ORAL_TABLET | ORAL | 0 refills | Status: DC

## 2019-08-14 NOTE — Telephone Encounter (Signed)
rx is for tramadol -- sent

## 2019-08-14 NOTE — Telephone Encounter (Signed)
Last RF 07/18/19 Last OV 05/06/19 Next OV 12/31/19

## 2019-08-14 NOTE — Telephone Encounter (Signed)
Is requesting refill on hydrocodone to be sent to [facility] on [address]

## 2019-08-16 ENCOUNTER — Ambulatory Visit: Payer: Medicare Other | Admitting: Cardiovascular Disease

## 2019-09-06 ENCOUNTER — Telehealth: Payer: Self-pay | Admitting: Internal Medicine

## 2019-09-06 MED ORDER — CEPHALEXIN 500 MG PO CAPS: 500.0000 mg | ORAL_CAPSULE | Freq: Three times a day (TID) | ORAL | 0 refills | Status: DC

## 2019-09-06 NOTE — Telephone Encounter (Signed)
Patient's son called and stated the patient has a R leg skin tear. He is requesting an ABX to be called in to get her through the weekend. He is unable to bring the patient in today or do a DOXY. He has set her up an appointment for Monday 2/22 to FU on this.   Pharmacy on file.

## 2019-09-06 NOTE — Telephone Encounter (Signed)
rx sent to pharmacy

## 2019-09-06 NOTE — Telephone Encounter (Signed)
Spoke with son and info given. 

## 2019-09-08 NOTE — Progress Notes (Signed)
Subjective:    Patient ID: [name], female    DOB: [birth year], 86 y.o.   MRN: [id number]  HPI The patient is here for an acute visit.   The end of last week she scraped her right lower leg on the dishwasher.  She sustained a skin tear and there was a foul-smelling purulent discharge.  Her son called on Friday to request an antibiotic because he was not able to bring her in or do a virtual visit.  He is a retired PA and you that she required an antibiotic.  She is here today for follow-up.  He states the discharge has improved there is still a slight odor, but it is much better.  She is tolerating the antibiotic.  He is kept the area bandaged.  There is some mild surrounding erythema.  There have been no fevers or chills.  Rash: She has several areas that appear to be tinea corporis.  She has a couple on her back and a couple in her legs.  They are very itchy.  Chronic back pain: She has been dealing with chronic back pain for years.  She has been taking tramadol 2-3 times a day, but the pain still is present.  She also takes Tylenol regularly.  In the past I have prescribed hydrocodone which her son thinks works much better for her without side effects.  He does not distribute her medication and in the past only gave her 1 a day and that seemed to be very effective.    Medications and allergies reviewed with patient and updated if appropriate.  Patient Active Problem List   Diagnosis Date Noted  . Hypomagnesemia 07/01/2019  . Hypophosphatemia 07/01/2019  . Metabolic acidosis 99991111  . Atrial fibrillation with RVR ([place]) 06/19/2019  . DNR (do not resuscitate) 06/19/2019  . Syncope 06/17/2019  . Memory difficulties 12/21/2018  . Acute cystitis without hematuria 12/21/2018  . T12 compression fracture ([place]) 04/24/2018  . Femur fracture, right ([place]) 01/14/2018  . Macular degeneration (senile) of retina 06/14/2010  . Vitamin D deficiency 03/10/2009  . Osteoporosis 03/10/2009    . NEOPLASM, MALIGNANT, BLADDER, HX OF 03/10/2009  . 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  . Prediabetes 06/26/2008  . Essential hypertension 08/09/2007  . Paroxysmal atrial fibrillation ([place]) 08/09/2007    Current Outpatient Medications on File Prior to Visit  Medication Sig Dispense Refill  . acetaminophen (TYLENOL) 500 MG tablet Take 500 mg by mouth daily as needed for moderate pain.     Marland Kitchen apixaban (ELIQUIS) 2.5 MG TABS tablet Take 1 tablet (2.5 mg total) by mouth 2 (two) times daily. 60 tablet 5  . calcium carbonate (OS-CAL) 600 MG TABS tablet Take 600 mg by mouth 2 (two) times daily.     . cephALEXin (KEFLEX) 500 MG capsule Take 1 capsule (500 mg total) by mouth 3 (three) times daily. 30 capsule 0  . Cholecalciferol (VITAMIN D3 SUPER STRENGTH) 50 MCG (2000 UT) TABS Take 2,000 Units by mouth daily with lunch.    . Magnesium 400 MG TABS Take 400 mg by mouth daily with lunch.    . metoprolol tartrate (LOPRESSOR) 50 MG tablet Take 1 tablet (50 mg total) by mouth 2 (two) times daily. 180 tablet 1  . multivitamin-lutein (OCUVITE-LUTEIN) CAPS capsule Take 1 capsule by mouth daily with lunch.     . Omega-3 Fatty Acids (FISH OIL) 1000 MG CAPS Take 1,000 mg by mouth daily with lunch.     . tretinoin (RETIN-A) 0.05 %  cream Apply 1 application topically daily.      No current facility-administered medications on file prior to visit.    Past Medical History:  Diagnosis Date  . Anemia   . Arthritis    "back" (01/16/2018)  . Atrial fibrillation ([place])   . Cancer [facility])    low grade papillary, most recent recurrence 2011,  Dr Karsten Ro  . 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 History of blood transfusion 01/15/2018   S/P OR  . Hypertension   . Hyponatremia 01/26/12   Na+ 131  . Macular degeneration, bilateral    "were wet; dry now" (01/16/2018)  . Other abnormal glucose 06/14/12   Fasting blood glucose 128; A1c 5.8% on 12/15/11    Past Surgical History:  Procedure Laterality Date   . ABDOMINAL HYSTERECTOMY  1970s   with oopherectomy for incidenctal cyst (no PMH of abnormal PAP)  . APPENDECTOMY     1970s  . BLADDER TUMOR EXCISION  2011 & 2013   Dr Karsten Ro  . CATARACT EXTRACTION, BILATERAL  2014   Dr Herbert Deaner  . COLONOSCOPY     negative; Dr Olevia Perches  . CYSTOSCOPY      multiple since tumor excision;Dr Ottelin  . DILATION AND CURETTAGE OF UTERUS    . FRACTURE SURGERY    . INTRAMEDULLARY (IM) NAIL INTERTROCHANTERIC Right 01/15/2018   Procedure: INTRAMEDULLARY (IM) NAIL INTERTROCHANTRIC;  Surgeon: Melrose Nakayama, MD;  Location: [place];  Service: Orthopedics;  Laterality: Right;  . KYPHOPLASTY N/A 03/08/2018   Procedure: THORACIC 12 KYPHOPLASTY;  Surgeon: Phylliss Bob, MD;  Location: [place];  Service: Orthopedics;  Laterality: N/A;  . TONSILLECTOMY      Social History   Socioeconomic History  . Marital status: Widowed    Spouse name: Not on file  . Number of children: 2  . Years of education: Not on file  . Highest education level: Not on file  Occupational History  . Occupation: Retired  Tobacco Use  . Smoking status: Never Smoker  . Smokeless tobacco: Never Used  Substance and Sexual Activity  . Alcohol use: Never    Alcohol/week: 0.0 standard drinks  . Drug use: Never  . Sexual activity: Never  Other Topics Concern  . Not on file  Social History Narrative  . Not on file   Social Determinants of Health   Financial Resource Strain: Low Risk   . Difficulty of Paying Living Expenses: Not hard at all  Food Insecurity: No Food Insecurity  . Worried About Charity fundraiser in the Last Year: Never true  . Ran Out of Food in the Last Year: Never true  Transportation Needs: No Transportation Needs  . Lack of Transportation (Medical): No  . Lack of Transportation (Non-Medical): No  Physical Activity: Insufficiently Active  . Days of Exercise per Week: 3 days  . Minutes of Exercise per Session: 30 min  Stress: No Stress Concern Present  . Feeling of  Stress : Not at all  Social Connections: Slightly Isolated  . Frequency of Communication with Friends and Family: More than three times a week  . Frequency of Social Gatherings with Friends and Family: More than three times a week  . Attends Religious Services: More than 4 times per year  . Active Member of Clubs or Organizations: Yes  . Attends Archivist Meetings: More than 4 times per year  . Marital Status: Widowed    Family History  Problem Relation Age of Onset  .  Stroke Mother 55  . Heart attack Father 43  . Coronary artery disease Brother        S/P CBAG ? @ 60  . Diabetes Brother   . Hypertension Brother   . Kidney disease Neg Hx   . Cancer Neg Hx   . Colon cancer Neg Hx   . Colon polyps Neg Hx     Review of Systems  Constitutional: Negative for chills and fever.  Musculoskeletal: Positive for back pain.  Skin: Positive for rash and wound.       Objective:   Vitals:   09/09/19 1433  BP: 140/72  Pulse: 76  Resp: 16  Temp: 98 F (36.7 C)  SpO2: 99%   BP Readings from Last 3 Encounters:  09/09/19 140/72  07/22/19 (!) 142/82  07/02/19 138/62   Wt Readings from Last 3 Encounters:  09/09/19 109 lb (49.4 kg)  07/22/19 102 lb (46.3 kg)  07/02/19 101 lb 12.8 oz (46.2 kg)   Body mass index is 18.71 kg/m.   Physical Exam Constitutional:      General: She is not in acute distress.    Appearance: Normal appearance. She is not ill-appearing.  Skin:    General: Skin is warm and dry.     Findings: Rash (Circular dry, erythematous patches suggestive of tinea corporis-2 visible on back) present.     Comments: Right lower leg with skin tear with slight odor and minimal purulent discharge noted, minimal surrounding erythema that seems within normal limits for healing wound, slightly tender around skin tear, no active bleeding, minimal swelling  Neurological:     Mental Status: She is alert.            Assessment & Plan:    See Problem List  for Assessment and Plan of chronic medical problems.     This visit occurred during the SARS-CoV-2 public health emergency.  Safety protocols were in place, including screening questions prior to the visit, additional usage of staff PPE, and extensive cleaning of exam room while observing appropriate contact time as indicated for disinfecting solutions.

## 2019-09-09 ENCOUNTER — Encounter: Payer: Self-pay | Admitting: Internal Medicine

## 2019-09-09 ENCOUNTER — Ambulatory Visit: Payer: Medicare Other | Admitting: Internal Medicine

## 2019-09-09 ENCOUNTER — Other Ambulatory Visit: Payer: Self-pay

## 2019-09-09 DIAGNOSIS — T148XXA Other injury of unspecified body region, initial encounter: Secondary | ICD-10-CM | POA: Diagnosis not present

## 2019-09-09 DIAGNOSIS — B354 Tinea corporis: Secondary | ICD-10-CM | POA: Diagnosis not present

## 2019-09-09 DIAGNOSIS — L089 Local infection of the skin and subcutaneous tissue, unspecified: Secondary | ICD-10-CM

## 2019-09-09 DIAGNOSIS — S22080D Wedge compression fracture of T11-T12 vertebra, subsequent encounter for fracture with routine healing: Secondary | ICD-10-CM

## 2019-09-09 MED ORDER — HYDROCODONE-ACETAMINOPHEN 5-325 MG PO TABS: 1.0000 | ORAL_TABLET | Freq: Every day | ORAL | 0 refills | Status: DC

## 2019-09-09 MED ORDER — CICLOPIROX OLAMINE 0.77 % EX CREA: TOPICAL_CREAM | Freq: Two times a day (BID) | CUTANEOUS | 1 refills | Status: DC

## 2019-09-09 NOTE — Assessment & Plan Note (Signed)
With chronic back pain Has been taking tramadol fairly regularly with only some relief of her pain Takes Tylenol on a regular basis Has been on hydrocodone in the past and that was much more effective Has tolerated hydrocodone without side effects and was taking less than the tramadol she is taking We will continue Tylenol Stop tramadol and start hydrocodone-acetaminophen 5-325 mg 1 tablet daily for chronic pain Her son dispenses her medication and will monitor

## 2019-09-09 NOTE — Assessment & Plan Note (Signed)
Acute Rash on back and likely legs, which apparently looks the same, but I did not view is suggestive of tinea corporis Ciclopirox twice daily until resolved

## 2019-09-09 NOTE — Patient Instructions (Signed)
   Medications reviewed and updated.  Changes include :   Stop tramadol and start vicodin daily.  Use the ciclopirox as needed.    Your prescription(s) have been submitted to your pharmacy. Please take as directed and contact our office if you believe you are having problem(s) with the medication(s).  Please call if there is no improvement in your symptoms.

## 2019-09-09 NOTE — Assessment & Plan Note (Signed)
Acute-scraped on the dishwasher last week Right lower leg Taking Keflex as prescribed and there has been improvement in the amount of discharge and the odor We will continue and complete entire course Her son is wrapping the wound and will keep it covered He will call if it does not continue to improve

## 2019-09-15 ENCOUNTER — Ambulatory Visit: Payer: Medicare Other | Attending: Internal Medicine

## 2019-09-15 DIAGNOSIS — Z23 Encounter for immunization: Secondary | ICD-10-CM | POA: Insufficient documentation

## 2019-09-15 NOTE — Progress Notes (Signed)
   Covid-19 Vaccination Clinic  Name:  [name]    MRN: [id number] DOB: [birth year]  09/15/2019  [name] was observed post Covid-19 immunization for 30 minutes based on pre-vaccination screening without incidence. She was provided with Vaccine Information Sheet and instruction to access the V-Safe system.   [name] was instructed to call 911 with any severe reactions post vaccine: Marland Kitchen Difficulty breathing  . Swelling of your face and throat  . A fast heartbeat  . A bad rash all over your body  . Dizziness and weakness    Immunizations Administered    Name Date Dose VIS Date Route   Pfizer COVID-19 Vaccine 09/15/2019 11:58 AM 0.3 mL 06/28/2019 Intramuscular   Manufacturer: [place]   Lot: [id number]   [place]: [id number]

## 2019-09-30 ENCOUNTER — Other Ambulatory Visit: Payer: Self-pay | Admitting: Internal Medicine

## 2019-09-30 MED ORDER — HYDROCODONE-ACETAMINOPHEN 5-325 MG PO TABS: 1.0000 | ORAL_TABLET | Freq: Every day | ORAL | 0 refills | Status: DC

## 2019-09-30 NOTE — Telephone Encounter (Signed)
    1.Medication Requested: HYDROcodone-acetaminophen (NORCO/VICODIN) 5-325 MG tablet  2. Pharmacy (Name, Street[facility] [id number] [place] [place]  3. On Med List: yes  4. Last Visit with PCP: 09/09/19  5. Next visit date with PCP: 12/31/19   Agent: Please be advised that RX refills may take up to 3 business days. We ask that you follow-up with your pharmacy.

## 2019-09-30 NOTE — Telephone Encounter (Signed)
Last RF 09/09/19 Last Follow up 05/05/20

## 2019-10-15 ENCOUNTER — Ambulatory Visit: Payer: Medicare Other | Attending: Internal Medicine

## 2019-10-15 DIAGNOSIS — Z23 Encounter for immunization: Secondary | ICD-10-CM

## 2019-10-15 NOTE — Progress Notes (Signed)
   Covid-19 Vaccination Clinic  Name:  VIRLEE BELLOW    MRN: [id number] DOB: April 13, 1930  10/15/2019  Ms. Deveraux was observed post Covid-19 immunization for 15 minutes without incident. She was provided with Vaccine Information Sheet and instruction to access the V-Safe system.   Ms. Bachman was instructed to call 911 with any severe reactions post vaccine: Marland Kitchen Difficulty breathing  . Swelling of face and throat  . A fast heartbeat  . A bad rash all over body  . Dizziness and weakness   Immunizations Administered    Name Date Dose VIS Date Route   Pfizer COVID-19 Vaccine 10/15/2019 12:53 PM 0.3 mL 06/28/2019 Intramuscular   Manufacturer: [place]   Lot: [id number]   [place]: [id number]

## 2019-10-22 ENCOUNTER — Other Ambulatory Visit: Payer: Medicare Other

## 2019-10-22 ENCOUNTER — Other Ambulatory Visit: Payer: Self-pay

## 2019-11-04 ENCOUNTER — Ambulatory Visit: Payer: Medicare Other | Admitting: Internal Medicine

## 2019-11-05 ENCOUNTER — Telehealth: Payer: Self-pay

## 2019-11-05 ENCOUNTER — Telehealth (INDEPENDENT_AMBULATORY_CARE_PROVIDER_SITE_OTHER): Payer: Self-pay

## 2019-11-05 MED ORDER — HYDROCODONE-ACETAMINOPHEN 5-325 MG PO TABS: 1.0000 | ORAL_TABLET | Freq: Every day | ORAL | 0 refills | Status: DC

## 2019-11-05 NOTE — Telephone Encounter (Signed)
Last OV 05/06/19 Next OV 12/31/19 Last RF 10/08/19

## 2019-11-05 NOTE — Telephone Encounter (Signed)
I have forwarded to Dr. Zadie Rhine as per her visit note 06/06/19 there is nothing documented that he would send a letter to the [facility]. Her vision OS was  20/80 pinhole. Dartha Lodge, BSN, RN

## 2019-11-05 NOTE — Telephone Encounter (Signed)
[name] called stating the last time his mother was seen 05/2019, Dr. Zadie Rhine had said he would send a letter to the [facility] about patients vision and to restrict pts license.  [name] called the [facility] and they said they never received the document.  [name] would like for this to be resent DMV fax number [phone].  Please call [name] if you have any problems [phone].

## 2019-11-05 NOTE — Telephone Encounter (Signed)
New message    POA [name] calling    1.Medication Requested:HYDROcodone-acetaminophen (NORCO/VICODIN) 5-325 MG tablet  2. Pharmacy (Name[facility] Street[id number] [place] [place]  3. On Med List: Yes   4. Last Visit with PCP: 2.22.2021   5. Next visit date with PCP: 6.15.21    Agent: Please be advised that RX refills may take up to 3 business days. We ask that you follow-up with your pharmacy.

## 2019-11-08 ENCOUNTER — Telehealth: Payer: Self-pay | Admitting: Cardiovascular Disease

## 2019-11-08 NOTE — Telephone Encounter (Signed)
Spoke with [name], aware of the recommendations. He feels she will not go but if it happens again he will call 911.

## 2019-11-08 NOTE — Telephone Encounter (Signed)
She may need to go to hospital. Seems a bit concerning.   [place] T. Audie Box, [place]  [address], [place], [place] [address] [phone]  5:34 PM

## 2019-11-08 NOTE — Telephone Encounter (Signed)
Spoke with [name], this morning the patient was sitting on the sofa and suddenly her head went back and her mouth was open. He shook her and did a sternal rub and she woke up, she said "who are you?" when she came to and then said ok. After this she was fine and felt fine, the same thing happened  3 hours later. He reports a bp of 130/80 and pulse of 90 bpm. He did a neuro check and everything was normal by his report. He is fine with the appointment on 11-19-19 and will call back or take her to the hospital if this happens again. Will make dr Audie Box aware.

## 2019-11-08 NOTE — Telephone Encounter (Signed)
Pt c/o Syncope: STAT if syncope occurred within 30 minutes and pt complains of lightheadedness High Priority if episode of passing out, completely, today or in last 24 hours   1. Did you pass out today? No   2. When is the last time you passed out? Yesterday Morning 11/07/19  3. Has this occurred multiple times? Not since December   4. Did you have any symptoms prior to passing out? No    [name] is calling stating [name] had two syncope episodes yesterday morning 11/07/19. He states she was on the couch so she didn't fall and there was no systems prior to passing out. An appointment has been scheduled for Dr. Kathalene Frames first available on 11/19/19 in regards to this. [name] was not with Romie Minus in order to transfer the call to triage to speak with the patient. Please advise.

## 2019-11-11 ENCOUNTER — Telehealth: Payer: Self-pay | Admitting: Internal Medicine

## 2019-11-11 NOTE — Telephone Encounter (Signed)
Antibiotic prophylaxis is no longer needed for mitral valve prolapse.-She can confirm with cardiology if she wishes.

## 2019-11-11 NOTE — Telephone Encounter (Signed)
    Patients son calling, does patient need antibiotic prior to routine dental cleaning? History mitral valve prolapse

## 2019-11-12 NOTE — Telephone Encounter (Signed)
Pts son aware of response.

## 2019-11-17 NOTE — Progress Notes (Signed)
Cardiology Office Note:   Date:  11/19/2019  NAME:  [name]    MRN: [id number] DOB:  22-Dec-1929   PCP:  Binnie Rail, MD  Cardiologist:  Evalina Field, MD   Referring MD: Binnie Rail, MD   Chief Complaint  Patient presents with   Loss of Consciousness   History of Present Illness:   [name] is a 86 y.o. female with a hx of atrial fibrillation, HTN, dementia who presents for follow-up. Diagnosed with Afib 05/2019 in setting of fall. Had recent syncopal events 11/08/2019.  She presents with her son for follow-up.  She did have a recent syncopal event around 1.5 weeks ago.  Her son reports this occurred in the morning while drinking coffee.  Apparently his mother slumped back while sitting in the couch and became nonresponsive.  He reports that he awoke her suddenly and she did arouse quickly without any limitations.  She came to really quickly without any complaints of chest pain or shortness of breath.  There were no complaints of chest pain or shortness of breath or palpitations prior to the event.  Apparently 1 hour later she has had a recurrent event which is described as similar to the first.  He reports that for about 10 to 15 seconds she became unresponsive.  She did come to quickly.  This was in the morning before eating breakfast or consuming any water.  She was drinking coffee.  The patient has no recollection of this event due to her dementia.  He reports her blood pressure was normal after the event.  They did not seek emergency medical care since she returned to normal relatively quickly.  There is no prodrome of symptoms reported for the event.  There is no seizure-like activity reported.  There is no incontinence reported.  Her dementia appears to be an issue in the home.  She is still driving and I advised against this.  She did become quite upset when we mention this in the room today.  Heart rate 111 today.  Blood pressure well controlled.  Overall seems to be doing  well minus this isolated event.  She is had no further recurrence of syncopal episodes.  Despite her dementia she does maintain a high level of activity.  She is walking around her house and doing things in the garden all day.  She describes no chest pain or shortness of breath with her current level of activity.  Hydration seems to be a big issue in the house.  Her son reports that she barely drinks any water all day.  I have recommended for her to increase her water intake.  Problem List 1. Persistent atrial fibrillation on eliquis (diagnosed 05/2019 in setting of fall) 2. Hypertension 3. Dementia  4. Carotid artery disease -1-39% R ICA -1-39% L ICA  Past Medical History: Past Medical History:  Diagnosis Date   Anemia    Arthritis    "back" (01/16/2018)   Atrial fibrillation (HCC)    Cancer (HCC)    low grade papillary, most recent recurrence 2011,  Dr Karsten Ro   Cardiac arrhythmia due to congenital heart disease    History of blood transfusion 01/15/2018   S/P OR   Hypertension    Hyponatremia 01/26/12   Na+ 131   Macular degeneration, bilateral    "were wet; dry now" (01/16/2018)   Other abnormal glucose 06/14/12   Fasting blood glucose 128; A1c 5.8% on 12/15/11    Past Surgical  History: Past Surgical History:  Procedure Laterality Date   ABDOMINAL HYSTERECTOMY  1970s   with oopherectomy for incidenctal cyst (no PMH of abnormal PAP)   APPENDECTOMY     1970s   BLADDER TUMOR EXCISION  2011 & 2013   Dr Karsten Ro   CATARACT EXTRACTION, BILATERAL  2014   Dr Herbert Deaner   COLONOSCOPY     negative; Dr Olevia Perches   CYSTOSCOPY      multiple since tumor excision;Dr Ottelin   DILATION AND CURETTAGE OF UTERUS     FRACTURE SURGERY     INTRAMEDULLARY (IM) NAIL INTERTROCHANTERIC Right 01/15/2018   Procedure: INTRAMEDULLARY (IM) NAIL INTERTROCHANTRIC;  Surgeon: Melrose Nakayama, MD;  Location: [facility];  Service: Orthopedics;  Laterality: Right;   KYPHOPLASTY N/A 03/08/2018    Procedure: THORACIC 12 KYPHOPLASTY;  Surgeon: Phylliss Bob, MD;  Location: [facility];  Service: Orthopedics;  Laterality: N/A;   TONSILLECTOMY      Current Medications: Current Meds  Medication Sig   acetaminophen (TYLENOL) 500 MG tablet Take 500 mg by mouth daily as needed for moderate pain.    apixaban (ELIQUIS) 2.5 MG TABS tablet Take 1 tablet (2.5 mg total) by mouth 2 (two) times daily.   calcium carbonate (OS-CAL) 600 MG TABS tablet Take 600 mg by mouth 2 (two) times daily.    Cholecalciferol (VITAMIN D3 SUPER STRENGTH) 50 MCG (2000 UT) TABS Take 2,000 Units by mouth daily with lunch.   ciclopirox (LOPROX) 0.77 % cream Apply topically 2 (two) times daily.   HYDROcodone-acetaminophen (NORCO/VICODIN) 5-325 MG tablet Take 1 tablet by mouth daily. For chronic back pain   Magnesium 400 MG TABS Take 400 mg by mouth daily with lunch.   multivitamin-lutein (OCUVITE-LUTEIN) CAPS capsule Take 1 capsule by mouth daily with lunch.    Omega-3 Fatty Acids (FISH OIL) 1000 MG CAPS Take 1,000 mg by mouth daily with lunch.    tretinoin (RETIN-A) 0.05 % cream Apply 1 application topically daily.    [DISCONTINUED] apixaban (ELIQUIS) 2.5 MG TABS tablet Take 1 tablet (2.5 mg total) by mouth 2 (two) times daily.   [DISCONTINUED] metoprolol tartrate (LOPRESSOR) 50 MG tablet Take 1 tablet (50 mg total) by mouth 2 (two) times daily.    Allergies:    Patient has no known allergies.   Social History: Social History   Socioeconomic History   Marital status: Widowed    Spouse name: Not on file   Number of children: 2   Years of education: Not on file   Highest education level: Not on file  Occupational History   Occupation: Retired  Tobacco Use   Smoking status: Never Smoker   Smokeless tobacco: Never Used  Substance and Sexual Activity   Alcohol use: Never    Alcohol/week: 0.0 standard drinks   Drug use: Never   Sexual activity: Never  Other Topics Concern   Not on file    Social History Narrative   Not on file   Social Determinants of Health   Financial Resource Strain:    Difficulty of Paying Living Expenses:   Food Insecurity:    Worried About Charity fundraiser in the Last Year:    Arboriculturist in the Last Year:   Transportation Needs:    Film/video editor (Medical):    Lack of Transportation (Non-Medical):   Physical Activity:    Days of Exercise per Week:    Minutes of Exercise per Session:   Stress:    Feeling of Stress :  Social Connections:    Frequency of Communication with Friends and Family:    Frequency of Social Gatherings with Friends and Family:    Attends Religious Services:    Active Member of Clubs or Organizations:    Attends Music therapist:    Marital Status:      Family History: The patient's family history includes Coronary artery disease in her brother; Diabetes in her brother; Heart attack (age of onset: 10) in her father; Hypertension in her brother; Stroke (age of onset: 58) in her mother. There is no history of Kidney disease, Cancer, Colon cancer, or Colon polyps.  ROS:   All other ROS reviewed and negative. Pertinent positives noted in the HPI.     EKGs/Labs/Other Studies Reviewed:   The following studies were personally reviewed by me today:  TTE 06/18/2019 1. Left ventricular ejection fraction, by visual estimation, is 50 to  55%. The left ventricle has low normal function. Left ventricular septal  wall thickness was mildly increased. There is no left ventricular  hypertrophy.  2. Global right ventricle has mildly reduced systolic function.The right  ventricular size is mildly enlarged. No increase in right ventricular wall  thickness.  3. Left atrial size was normal.  4. Right atrial size was moderately dilated.  5. The pericardial effusion is lateral to the left ventricle.  6. Trivial pericardial effusion is present.  7. Moderate calcification of the mitral  valve leaflet(s).  8. Moderate mitral annular calcification.  9. Moderate thickening of the mitral valve leaflet(s).  10. The mitral valve is degenerative. Mild mitral valve regurgitation.  11. The tricuspid valve is normal in structure. Tricuspid valve  regurgitation moderate.  12. The aortic valve is tricuspid. Aortic valve regurgitation is mild.  sclerosis without stenosis.  13. There is Moderate calcification of the aortic valve.  14. There is Moderate thickening of the aortic valve.  15. The pulmonic valve was not well visualized. Pulmonic valve  regurgitation is mild.  16. Shadowing artifact normal diameter.  17. Normal pulmonary artery systolic pressure.   Recent Labs: 06/17/2019: TSH 1.497 07/02/2019: ALT 10; BUN 18; Creatinine, Ser 0.87; Hemoglobin 13.1; Magnesium 2.1; Platelets 330.0; Potassium 4.2; Sodium 138   Recent Lipid Panel    Component Value Date/Time   CHOL 209 (H) 01/24/2017 0956   TRIG 68.0 01/24/2017 0956   HDL 80.00 01/24/2017 0956   CHOLHDL 3 01/24/2017 0956   VLDL 13.6 01/24/2017 0956   LDLCALC 115 (H) 01/24/2017 0956   LDLDIRECT 122.9 06/14/2010 0912    Physical Exam:   VS:  BP 120/90    Pulse (!) 111    Temp (!) 96.7 F (35.9 C)    Ht 5\' 5"  (1.651 m)    Wt 109 lb (49.4 kg)    SpO2 99%    BMI 18.14 kg/m    Wt Readings from Last 3 Encounters:  11/19/19 109 lb (49.4 kg)  09/09/19 109 lb (49.4 kg)  07/22/19 102 lb (46.3 kg)    General: Well nourished, well developed, in no acute distress Heart: Atraumatic, normal size  Eyes: PEERLA, EOMI  Neck: Supple, no JVD Endocrine: No thryomegaly Cardiac: Irregular rhythm, no murmurs rubs or gallops Lungs: Clear to auscultation bilaterally, no wheezing, rhonchi or rales  Abd: Soft, nontender, no hepatomegaly  Ext: No edema, pulses 2+ Musculoskeletal: No deformities, BUE and BLE strength normal and equal Skin: Warm and dry, no rashes   Neuro: Alert and oriented to person, place, time, and situation,  CNII-XII  grossly intact, no focal deficits  Psych: Normal mood and affect   ASSESSMENT:   [name] is a 86 y.o. female who presents for the following: 1. Syncope and collapse   2. Persistent atrial fibrillation ([place])     PLAN:   1. Syncope and collapse -She has had recurrent event of her syncope.  This occurred while sitting.  This resulted in brief loss of consciousness per the son that lasted 10 to 15 seconds.  She regained consciousness very quickly and was back to normal within seconds of the event.  There was a second event 1 hour after this.  No further recurrence.  I highly suspect this is a reflex mediated syncope.  She had an episode of this in November 2020.  She underwent monitoring the hospital which revealed atrial fibrillation.  Her A. fib has been well rate controlled.  She also had carotid ultrasounds which were negative.  She did have CT scans which demonstrated lacunar infarcts as well as chronic small vessel disease.  Her echocardiogram demonstrates normal LV function.  She describes no chest pain or shortness of breath with these episodes.  She is able to maintain a fairly high level of activity given her dementia without any symptoms.  Overall I suspect this is reflex mediated syncope of some sort.  This could be related to what she was drinking that day.  I think she is dehydrated chronically and needs to increase her water intake.  I encouraged her to do this.  Given her recurrent episodes I do not think it is safe for her to drive.  Furthermore she has dementia and I recommended no further driving.  Her son will ensure this.  She did become quite upset when I recommended this but her primary care physician has recommended this as well.  In the event her A. fib is worsening her symptoms we will go ahead and switch her to metoprolol succinate as below.  We will plan to try for better rate control as her blood pressure tolerates.  2. Persistent atrial fibrillation (HCC) -We  will switch her over to metoprolol succinate 50 mg twice daily.  Hopefully she will get better rate control with this.  We will continue Eliquis 2.5 mg twice daily.  The reduced dose is recommended based on her weight and age.  I have recommended she maintain good hydration as well as adequate p.o. intake.  Disposition: Return in about 3 months (around 02/19/2020).  Medication Adjustments/Labs and Tests Ordered: Current medicines are reviewed at length with the patient today.  Concerns regarding medicines are outlined above.  No orders of the defined types were placed in this encounter.  Meds ordered this encounter  Medications   metoprolol succinate (TOPROL-XL) 50 MG 24 hr tablet    Sig: Take 1 tablet (50 mg total) by mouth 2 (two) times daily. Take with or immediately following a meal.    Dispense:  90 tablet    Refill:  3   apixaban (ELIQUIS) 2.5 MG TABS tablet    Sig: Take 1 tablet (2.5 mg total) by mouth 2 (two) times daily.    Dispense:  60 tablet    Refill:  5    Patient Instructions  Medication Instructions:  Stop Metoprolol Tartrate Start Metoprolol Succinate 50 mg twice daily   *If you need a refill on your cardiac medications before your next appointment, please call your pharmacy*    Follow-Up: At [facility], you and your health needs are our  priority.  As part of our continuing mission to provide you with exceptional heart care, we have created designated Provider Care Teams.  These Care Teams include your primary Cardiologist (physician) and Advanced Practice Providers (APPs -  Physician Assistants and Nurse Practitioners) who all work together to provide you with the care you need, when you need it.  We recommend signing up for the patient portal called "MyChart".  Sign up information is provided on this After Visit Summary.  MyChart is used to connect with patients for Virtual Visits (Telemedicine).  Patients are able to view lab/test results, encounter notes,  upcoming appointments, etc.  Non-urgent messages can be sent to your provider as well.   To learn more about what you can do with MyChart, go to [email or web address].    Your next appointment:   3 month(s)  The format for your next appointment:   In Person  Provider:   Eleonore Chiquito, MD         Time Spent with Patient: I have spent a total of 25 minutes with patient reviewing hospital notes, telemetry, EKGs, labs and examining the patient as well as establishing an assessment and plan that was discussed with the patient.  > 50% of time was spent in direct patient care.  Signed, Addison Naegeli. Audie Box, [place]  [address], [place], [place] [address] [phone]  11/19/2019 4:59 PM

## 2019-11-19 ENCOUNTER — Encounter: Payer: Self-pay | Admitting: Cardiovascular Disease

## 2019-11-19 ENCOUNTER — Ambulatory Visit (INDEPENDENT_AMBULATORY_CARE_PROVIDER_SITE_OTHER): Payer: Medicare Other | Admitting: Cardiovascular Disease

## 2019-11-19 ENCOUNTER — Other Ambulatory Visit: Payer: Self-pay

## 2019-11-19 VITALS — BP 120/90 | HR 111 | Temp 96.7°F | Ht 65.0 in | Wt 109.0 lb

## 2019-11-19 DIAGNOSIS — I4819 Other persistent atrial fibrillation: Secondary | ICD-10-CM

## 2019-11-19 DIAGNOSIS — R55 Syncope and collapse: Secondary | ICD-10-CM | POA: Diagnosis not present

## 2019-11-19 MED ORDER — APIXABAN 2.5 MG PO TABS: 2.5000 mg | ORAL_TABLET | Freq: Two times a day (BID) | ORAL | 5 refills | Status: DC

## 2019-11-19 MED ORDER — METOPROLOL SUCCINATE ER 50 MG PO TB24: 50.0000 mg | ORAL_TABLET | Freq: Two times a day (BID) | ORAL | 3 refills | Status: DC

## 2019-11-19 NOTE — Patient Instructions (Signed)
Medication Instructions:  Stop Metoprolol Tartrate Start Metoprolol Succinate 50 mg twice daily   *If you need a refill on your cardiac medications before your next appointment, please call your pharmacy*    Follow-Up: At [facility], you and your health needs are our priority.  As part of our continuing mission to provide you with exceptional heart care, we have created designated Provider Care Teams.  These Care Teams include your primary Cardiologist (physician) and Advanced Practice Providers (APPs -  Physician Assistants and Nurse Practitioners) who all work together to provide you with the care you need, when you need it.  We recommend signing up for the patient portal called "MyChart".  Sign up information is provided on this After Visit Summary.  MyChart is used to connect with patients for Virtual Visits (Telemedicine).  Patients are able to view lab/test results, encounter notes, upcoming appointments, etc.  Non-urgent messages can be sent to your provider as well.   To learn more about what you can do with MyChart, go to [email or web address].    Your next appointment:   3 month(s)  The format for your next appointment:   In Person  Provider:   Eleonore Chiquito, MD

## 2019-12-02 ENCOUNTER — Other Ambulatory Visit: Payer: Self-pay | Admitting: Internal Medicine

## 2019-12-02 MED ORDER — HYDROCODONE-ACETAMINOPHEN 5-325 MG PO TABS: 1.0000 | ORAL_TABLET | Freq: Every day | ORAL | 0 refills | Status: DC

## 2019-12-02 NOTE — Telephone Encounter (Signed)
New message:   1.Medication Requested: HYDROcodone-acetaminophen (NORCO/VICODIN) 5-325 MG tablet 2. Pharmacy (Name, Street, [id number] [place] [place] 3. On Med List: yes  4. Last Visit with PCP: 09/09/19  5. Next visit date with PCP:12/31/19   Agent: Please be advised that RX refills may take up to 3 business days. We ask that you follow-up with your pharmacy.

## 2019-12-02 NOTE — Telephone Encounter (Signed)
Last OV 07/01/20 Next OV 12/31/19 Last RF 11/05/19

## 2019-12-31 ENCOUNTER — Ambulatory Visit: Payer: Medicare Other | Admitting: Internal Medicine

## 2019-12-31 NOTE — Patient Instructions (Addendum)
  Blood work was ordered.     Prolia injection given today.    Medications reviewed and updated.  Changes include :   none  Your prescription(s) have been submitted to your pharmacy. Please take as directed and contact our office if you believe you are having problem(s) with the medication(s).     Please followup in 6 months

## 2019-12-31 NOTE — Progress Notes (Signed)
Subjective:    Patient ID: [name], female    DOB: 1929-12-05, 86 y.o.   MRN: [id number]  HPI The patient is here for follow up of their chronic medical problems, including htn,  Cognitive impairment, afib, OP, chronic back pain from T12 compression fx, prediabetes  She is taking all of her medications as prescribed.  Her son oversees her medication.   She fell last week - no injury.    She had a back injection yesterday by ortho.  The hydrocodone helps, but often she has pain in the afternoon.   Medications and allergies reviewed with patient and updated if appropriate.  Patient Active Problem List   Diagnosis Date Noted  . B12 deficiency 01/01/2020  . Infected skin tear 09/09/2019  . Tinea corporis 09/09/2019  . Hypomagnesemia 07/01/2019  . Hypophosphatemia 07/01/2019  . Metabolic acidosis 05/39/7673  . Atrial fibrillation with RVR ([place]) 06/19/2019  . DNR (do not resuscitate) 06/19/2019  . Syncope 06/17/2019  . Memory difficulties 12/21/2018  . Acute cystitis without hematuria 12/21/2018  . T12 compression fracture ([place]) 04/24/2018  . Femur fracture, right ([place]) 01/14/2018  . Macular degeneration (senile) of retina 06/14/2010  . Vitamin D deficiency 03/10/2009  . Osteoporosis 03/10/2009  . NEOPLASM, MALIGNANT, BLADDER, HX OF 03/10/2009  . 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  . Prediabetes 06/26/2008  . Essential hypertension 08/09/2007  . Paroxysmal atrial fibrillation ([place]) 08/09/2007    Current Outpatient Medications on File Prior to Visit  Medication Sig Dispense Refill  . acetaminophen (TYLENOL) 500 MG tablet Take 500 mg by mouth daily as needed for moderate pain.     Marland Kitchen apixaban (ELIQUIS) 2.5 MG TABS tablet Take 1 tablet (2.5 mg total) by mouth 2 (two) times daily. 60 tablet 5  . calcium carbonate (OS-CAL) 600 MG TABS tablet Take 600 mg by mouth 2 (two) times daily.     . Cholecalciferol (VITAMIN D3 SUPER STRENGTH) 50 MCG (2000 UT) TABS Take 2,000 Units by  mouth daily with lunch.    . ciclopirox (LOPROX) 0.77 % cream Apply topically 2 (two) times daily. 30 g 1  . Magnesium 400 MG TABS Take 400 mg by mouth daily with lunch.    . metoprolol succinate (TOPROL-XL) 50 MG 24 hr tablet Take 1 tablet (50 mg total) by mouth 2 (two) times daily. Take with or immediately following a meal. 90 tablet 3  . multivitamin-lutein (OCUVITE-LUTEIN) CAPS capsule Take 1 capsule by mouth daily with lunch.     . Omega-3 Fatty Acids (FISH OIL) 1000 MG CAPS Take 1,000 mg by mouth daily with lunch.     . tretinoin (RETIN-A) 0.05 % cream Apply 1 application topically daily.      No current facility-administered medications on file prior to visit.    Past Medical History:  Diagnosis Date  . Anemia   . Arthritis    "back" (01/16/2018)  . Atrial fibrillation ([place])   . Cancer [facility])    low grade papillary, most recent recurrence 2011,  Dr Karsten Ro  . 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 History of blood transfusion 01/15/2018   S/P OR  . Hypertension   . Hyponatremia 01/26/12   Na+ 131  . Macular degeneration, bilateral    "were wet; dry now" (01/16/2018)  . Other abnormal glucose 06/14/12   Fasting blood glucose 128; A1c 5.8% on 12/15/11    Past Surgical History:  Procedure Laterality Date  . ABDOMINAL HYSTERECTOMY  1970s   with  oopherectomy for incidenctal cyst (no PMH of abnormal PAP)  . APPENDECTOMY     1970s  . BLADDER TUMOR EXCISION  2011 & 2013   Dr Karsten Ro  . CATARACT EXTRACTION, BILATERAL  2014   Dr Herbert Deaner  . COLONOSCOPY     negative; Dr Olevia Perches  . CYSTOSCOPY      multiple since tumor excision;Dr Ottelin  . DILATION AND CURETTAGE OF UTERUS    . FRACTURE SURGERY    . INTRAMEDULLARY (IM) NAIL INTERTROCHANTERIC Right 01/15/2018   Procedure: INTRAMEDULLARY (IM) NAIL INTERTROCHANTRIC;  Surgeon: Melrose Nakayama, MD;  Location: [place];  Service: Orthopedics;  Laterality: Right;  . KYPHOPLASTY N/A 03/08/2018   Procedure: THORACIC 12  KYPHOPLASTY;  Surgeon: Phylliss Bob, MD;  Location: [place];  Service: Orthopedics;  Laterality: N/A;  . TONSILLECTOMY      Social History   Socioeconomic History  . Marital status: Widowed    Spouse name: Not on file  . Number of children: 2  . Years of education: Not on file  . Highest education level: Not on file  Occupational History  . Occupation: Retired  Tobacco Use  . Smoking status: Never Smoker  . Smokeless tobacco: Never Used  Vaping Use  . Vaping Use: Never used  Substance and Sexual Activity  . Alcohol use: Never    Alcohol/week: 0.0 standard drinks  . Drug use: Never  . Sexual activity: Never  Other Topics Concern  . Not on file  Social History Narrative  . Not on file   Social Determinants of Health   Financial Resource Strain:   . Difficulty of Paying Living Expenses:   Food Insecurity:   . Worried About Charity fundraiser in the Last Year:   . Arboriculturist in the Last Year:   Transportation Needs:   . Film/video editor (Medical):   Marland Kitchen Lack of Transportation (Non-Medical):   Physical Activity:   . Days of Exercise per Week:   . Minutes of Exercise per Session:   Stress:   . Feeling of Stress :   Social Connections:   . Frequency of Communication with Friends and Family:   . Frequency of Social Gatherings with Friends and Family:   . Attends Religious Services:   . Active Member of Clubs or Organizations:   . Attends Archivist Meetings:   Marland Kitchen Marital Status:     Family History  Problem Relation Age of Onset  . Stroke Mother 29  . Heart attack Father 82  . Coronary artery disease Brother        S/P CBAG ? @ 60  . Diabetes Brother   . Hypertension Brother   . Kidney disease Neg Hx   . Cancer Neg Hx   . Colon cancer Neg Hx   . Colon polyps Neg Hx     Review of Systems  Constitutional: Negative for chills and fever.  Respiratory: Negative for cough, shortness of breath and wheezing.   Cardiovascular: Positive for leg  swelling (mild at times). Negative for chest pain and palpitations.  Neurological: Negative for dizziness, light-headedness and headaches.  Psychiatric/Behavioral: Negative for sleep disturbance.       Objective:   Vitals:   01/01/20 1410  BP: 134/76  Pulse: 60  Temp: 98.1 F (36.7 C)  SpO2: 97%   BP Readings from Last 3 Encounters:  01/01/20 134/76  11/19/19 120/90  09/09/19 140/72   Wt Readings from Last 3 Encounters:  01/01/20 113 lb (51.3  kg)  11/19/19 109 lb (49.4 kg)  09/09/19 109 lb (49.4 kg)   Body mass index is 18.8 kg/m.   Physical Exam    Constitutional: Appears well-developed and well-nourished. No distress.  HENT:  Head: Normocephalic and atraumatic.  Neck: Neck supple. No tracheal deviation present. No thyromegaly present.  No cervical lymphadenopathy Cardiovascular: Normal rate, regular rhythm and normal heart sounds.   2/6 systolic murmur heard. No carotid bruit .  No edema Pulmonary/Chest: Effort normal and breath sounds normal. No respiratory distress. No has no wheezes. No rales.  Skin: Skin is warm and dry. Not diaphoretic.  Psychiatric: Normal mood and affect. Behavior is normal.      Assessment & Plan:    See Problem List for Assessment and Plan of chronic medical problems.    This visit occurred during the SARS-CoV-2 public health emergency.  Safety protocols were in place, including screening questions prior to the visit, additional usage of staff PPE, and extensive cleaning of exam room while observing appropriate contact time as indicated for disinfecting solutions.

## 2020-01-01 ENCOUNTER — Ambulatory Visit: Payer: Medicare Other | Admitting: Internal Medicine

## 2020-01-01 ENCOUNTER — Other Ambulatory Visit: Payer: Self-pay

## 2020-01-01 ENCOUNTER — Encounter: Payer: Self-pay | Admitting: Internal Medicine

## 2020-01-01 VITALS — BP 134/76 | HR 60 | Temp 98.1°F | Wt 113.0 lb

## 2020-01-01 DIAGNOSIS — M810 Age-related osteoporosis without current pathological fracture: Secondary | ICD-10-CM | POA: Diagnosis not present

## 2020-01-01 DIAGNOSIS — I10 Essential (primary) hypertension: Secondary | ICD-10-CM

## 2020-01-01 DIAGNOSIS — E538 Deficiency of other specified B group vitamins: Secondary | ICD-10-CM

## 2020-01-01 DIAGNOSIS — R413 Other amnesia: Secondary | ICD-10-CM

## 2020-01-01 DIAGNOSIS — E559 Vitamin D deficiency, unspecified: Secondary | ICD-10-CM | POA: Diagnosis not present

## 2020-01-01 DIAGNOSIS — R7303 Prediabetes: Secondary | ICD-10-CM | POA: Diagnosis not present

## 2020-01-01 DIAGNOSIS — I4891 Unspecified atrial fibrillation: Secondary | ICD-10-CM | POA: Diagnosis not present

## 2020-01-01 DIAGNOSIS — S22080D Wedge compression fracture of T11-T12 vertebra, subsequent encounter for fracture with routine healing: Secondary | ICD-10-CM

## 2020-01-01 LAB — COMPREHENSIVE METABOLIC PANEL
ALT: 27 U/L (ref 0–35)
AST: 22 U/L (ref 0–37)
Albumin: 4 g/dL (ref 3.5–5.2)
Alkaline Phosphatase: 82 U/L (ref 39–117)
BUN: 21 mg/dL (ref 6–23)
CO2: 30 mEq/L (ref 19–32)
Calcium: 9.7 mg/dL (ref 8.4–10.5)
Chloride: 101 mEq/L (ref 96–112)
Creatinine, Ser: 0.76 mg/dL (ref 0.40–1.20)
GFR: 71.83 mL/min (ref >60.00)
Glucose, Bld: 146 mg/dL — ABNORMAL HIGH (ref 70–99)
Potassium: 4.3 mEq/L (ref 3.5–5.1)
Sodium: 136 mEq/L (ref 135–145)
Total Bilirubin: 0.5 mg/dL (ref 0.2–1.2)
Total Protein: 7.2 g/dL (ref 6.0–8.3)

## 2020-01-01 LAB — CBC WITH DIFFERENTIAL/PLATELET
Basophils Absolute: 0 10*3/uL (ref 0.0–0.1)
Basophils Relative: 0.1 % (ref 0.0–3.0)
Eosinophils Absolute: 0 10*3/uL (ref 0.0–0.7)
Eosinophils Relative: 0 % (ref 0.0–5.0)
HCT: 38.4 % (ref 36.0–46.0)
Hemoglobin: 12.7 g/dL (ref 12.0–15.0)
Lymphocytes Relative: 6.9 % — ABNORMAL LOW (ref 12.0–46.0)
Lymphs Abs: 0.8 10*3/uL (ref 0.7–4.0)
MCHC: 33.1 g/dL (ref 30.0–36.0)
MCV: 93.1 fl (ref 78.0–100.0)
Monocytes Absolute: 1 10*3/uL (ref 0.1–1.0)
Monocytes Relative: 8.1 % (ref 3.0–12.0)
Neutro Abs: 10 10*3/uL — ABNORMAL HIGH (ref 1.4–7.7)
Neutrophils Relative %: 84.9 % — ABNORMAL HIGH (ref 43.0–77.0)
Platelets: 277 10*3/uL (ref 150.0–400.0)
RBC: 4.13 Mil/uL (ref 3.87–5.11)
RDW: 15.2 % (ref 11.5–15.5)
WBC: 11.8 10*3/uL — ABNORMAL HIGH (ref 4.0–10.5)

## 2020-01-01 LAB — TSH: TSH: 0.56 u[IU]/mL (ref 0.35–4.50)

## 2020-01-01 LAB — VITAMIN B12: Vitamin B-12: 217 pg/mL (ref 211–911)

## 2020-01-01 LAB — VITAMIN D 25 HYDROXY (VIT D DEFICIENCY, FRACTURES): VITD: 41.21 ng/mL (ref 30.00–100.00)

## 2020-01-01 MED ORDER — HYDROCODONE-ACETAMINOPHEN 5-325 MG PO TABS: 1.0000 | ORAL_TABLET | Freq: Two times a day (BID) | ORAL | 0 refills | Status: DC | PRN

## 2020-01-01 NOTE — Assessment & Plan Note (Signed)
Check level 

## 2020-01-01 NOTE — Assessment & Plan Note (Addendum)
Chronic Getting prolia Q 6 months - due - will given today Continue calcium and vitamin d daily Dexa due - ordered

## 2020-01-01 NOTE — Assessment & Plan Note (Signed)
Chronic BP well controlled Current regimen effective and well tolerated Continue current medications at current doses cmp  

## 2020-01-01 NOTE — Assessment & Plan Note (Addendum)
Chronic with chronic back pain Taking vicodin once daily and that has been effective - her son manages the medication - she sometimes has pain in afternoon Will increase vicodin to BID prn Pain medication is improving quality of life, she is taking minimal amount and her son is managing Will continue hydrocodone for chronic pain

## 2020-01-01 NOTE — Assessment & Plan Note (Addendum)
Chronic Has not seen neuro - did not want to do telemedicine visit Discussed neuro referral - her son does not feel it would be helpful

## 2020-01-01 NOTE — Assessment & Plan Note (Signed)
Chronic No symptoms Management per cardiology Cbc, cmp, tsh

## 2020-01-01 NOTE — Assessment & Plan Note (Signed)
Chronic Taking vitamin d daily Has OP Check level

## 2020-01-01 NOTE — Assessment & Plan Note (Signed)
Chronic Check a1c Low sugar / carb diet Stressed regular exercise  

## 2020-01-02 LAB — HEMOGLOBIN A1C: Hgb A1c MFr Bld: 6.9 % — ABNORMAL HIGH (ref 4.6–6.5)

## 2020-01-04 ENCOUNTER — Encounter: Payer: Self-pay | Admitting: Internal Medicine

## 2020-01-10 ENCOUNTER — Other Ambulatory Visit: Payer: Medicare Other

## 2020-01-22 ENCOUNTER — Other Ambulatory Visit: Payer: Medicare Other

## 2020-01-29 ENCOUNTER — Ambulatory Visit (INDEPENDENT_AMBULATORY_CARE_PROVIDER_SITE_OTHER)
Admission: RE | Admit: 2020-01-29 | Discharge: 2020-01-29 | Disposition: A | Discharge status: Home / Self Care | Payer: Medicare Other | Source: Ambulatory Visit | Attending: Internal Medicine | Admitting: Internal Medicine

## 2020-01-29 ENCOUNTER — Other Ambulatory Visit: Payer: Self-pay

## 2020-01-29 DIAGNOSIS — M810 Age-related osteoporosis without current pathological fracture: Secondary | ICD-10-CM | POA: Diagnosis not present

## 2020-01-31 ENCOUNTER — Telehealth: Payer: Self-pay | Admitting: Internal Medicine

## 2020-01-31 NOTE — Telephone Encounter (Signed)
    1.Medication Requested:HYDROcodone-acetaminophen (NORCO/VICODIN) 5-325 MG tablet  2. Pharmacy (Name, Street, City):[id number] - [place], [place] [address]  3. On Med List: yes  4. Last Visit with PCP: 01/01/20  5. Next visit date with PCP: 02/14/20   Agent: Please be advised that RX refills may take up to 3 business days. We ask that you follow-up with your pharmacy.

## 2020-02-01 ENCOUNTER — Other Ambulatory Visit: Payer: Self-pay | Admitting: Internal Medicine

## 2020-02-01 DIAGNOSIS — S22080D Wedge compression fracture of T11-T12 vertebra, subsequent encounter for fracture with routine healing: Secondary | ICD-10-CM

## 2020-02-01 MED ORDER — HYDROCODONE-ACETAMINOPHEN 5-325 MG PO TABS: 1.0000 | ORAL_TABLET | Freq: Two times a day (BID) | ORAL | 0 refills | Status: DC | PRN

## 2020-02-06 ENCOUNTER — Encounter: Payer: Self-pay | Admitting: Cardiovascular Disease

## 2020-02-13 NOTE — Progress Notes (Signed)
Subjective:    Patient ID: [name], female    DOB: 10-16-1929, 86 y.o.   MRN: [id number]  HPI The patient is here for an acute visit.     3 weeks of c/o jaw pain.   It is intermittent.  Eating makes it worse and clenching down on her jaw makes it worse.  She denies any teeth pain.  She denies any discomfort on her teeth when she chews-she points more to the right lower, posterior jaw.  She denies sore throat.  She sees her dentist every 6 months and her last appointment was 3 months ago.  She is not having any difficulty eating.  Last prolia injection was 01/01/20.  Started 05/2018.       Medications and allergies reviewed with patient and updated if appropriate.  Patient Active Problem List   Diagnosis Date Noted  . B12 deficiency 01/01/2020  . Infected skin tear 09/09/2019  . Tinea corporis 09/09/2019  . Hypomagnesemia 07/01/2019  . Hypophosphatemia 07/01/2019  . Metabolic acidosis 09/32/6712  . Atrial fibrillation with RVR ([place]) 06/19/2019  . DNR (do not resuscitate) 06/19/2019  . Syncope 06/17/2019  . Memory difficulties 12/21/2018  . Acute cystitis without hematuria 12/21/2018  . T12 compression fracture ([place]) 04/24/2018  . Femur fracture, right ([place]) 01/14/2018  . Macular degeneration (senile) of retina 06/14/2010  . Vitamin D deficiency 03/10/2009  . Osteoporosis 03/10/2009  . NEOPLASM, MALIGNANT, BLADDER, HX OF 03/10/2009  . 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  . Diabetes mellitus without complication ([place]) 45/80/9983  . Essential hypertension 08/09/2007  . Paroxysmal atrial fibrillation ([place]) 08/09/2007    Current Outpatient Medications on File Prior to Visit  Medication Sig Dispense Refill  . acetaminophen (TYLENOL) 500 MG tablet Take 500 mg by mouth daily as needed for moderate pain.     Marland Kitchen apixaban (ELIQUIS) 2.5 MG TABS tablet Take 1 tablet (2.5 mg total) by mouth 2 (two) times daily. 60 tablet 5  . calcium carbonate (OS-CAL) 600 MG TABS tablet Take  600 mg by mouth 2 (two) times daily.     . Cholecalciferol (VITAMIN D3 SUPER STRENGTH) 50 MCG (2000 UT) TABS Take 2,000 Units by mouth daily with lunch.    . ciclopirox (LOPROX) 0.77 % cream Apply topically 2 (two) times daily. 30 g 1  . HYDROcodone-acetaminophen (NORCO/VICODIN) 5-325 MG tablet Take 1 tablet by mouth every 12 (twelve) hours as needed for moderate pain. For chronic back pain 60 tablet 0  . Magnesium 400 MG TABS Take 400 mg by mouth daily with lunch.    . metoprolol succinate (TOPROL-XL) 50 MG 24 hr tablet Take 1 tablet (50 mg total) by mouth 2 (two) times daily. Take with or immediately following a meal. 90 tablet 3  . multivitamin-lutein (OCUVITE-LUTEIN) CAPS capsule Take 1 capsule by mouth daily with lunch.     . Omega-3 Fatty Acids (FISH OIL) 1000 MG CAPS Take 1,000 mg by mouth daily with lunch.     . tretinoin (RETIN-A) 0.05 % cream Apply 1 application topically daily.      No current facility-administered medications on file prior to visit.    Past Medical History:  Diagnosis Date  . Anemia   . Arthritis    "back" (01/16/2018)  . Atrial fibrillation ([place])   . Cancer [facility])    low grade papillary, most recent recurrence 2011,  Dr Karsten Ro  . 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 History of blood transfusion 01/15/2018   S/P  OR  . Hypertension   . Hyponatremia 01/26/12   Na+ 131  . Macular degeneration, bilateral    "were wet; dry now" (01/16/2018)  . Other abnormal glucose 06/14/12   Fasting blood glucose 128; A1c 5.8% on 12/15/11    Past Surgical History:  Procedure Laterality Date  . ABDOMINAL HYSTERECTOMY  1970s   with oopherectomy for incidenctal cyst (no PMH of abnormal PAP)  . APPENDECTOMY     1970s  . BLADDER TUMOR EXCISION  2011 & 2013   Dr Karsten Ro  . CATARACT EXTRACTION, BILATERAL  2014   Dr Herbert Deaner  . COLONOSCOPY     negative; Dr Olevia Perches  . CYSTOSCOPY      multiple since tumor excision;Dr Ottelin  . DILATION AND CURETTAGE OF UTERUS     . FRACTURE SURGERY    . INTRAMEDULLARY (IM) NAIL INTERTROCHANTERIC Right 01/15/2018   Procedure: INTRAMEDULLARY (IM) NAIL INTERTROCHANTRIC;  Surgeon: Melrose Nakayama, MD;  Location: [place];  Service: Orthopedics;  Laterality: Right;  . KYPHOPLASTY N/A 03/08/2018   Procedure: THORACIC 12 KYPHOPLASTY;  Surgeon: Phylliss Bob, MD;  Location: [place];  Service: Orthopedics;  Laterality: N/A;  . TONSILLECTOMY      Social History   Socioeconomic History  . Marital status: Widowed    Spouse name: Not on file  . Number of children: 2  . Years of education: Not on file  . Highest education level: Not on file  Occupational History  . Occupation: Retired  Tobacco Use  . Smoking status: Never Smoker  . Smokeless tobacco: Never Used  Vaping Use  . Vaping Use: Never used  Substance and Sexual Activity  . Alcohol use: Never    Alcohol/week: 0.0 standard drinks  . Drug use: Never  . Sexual activity: Never  Other Topics Concern  . Not on file  Social History Narrative  . Not on file   Social Determinants of Health   Financial Resource Strain:   . Difficulty of Paying Living Expenses:   Food Insecurity:   . Worried About Charity fundraiser in the Last Year:   . Arboriculturist in the Last Year:   Transportation Needs:   . Film/video editor (Medical):   Marland Kitchen Lack of Transportation (Non-Medical):   Physical Activity:   . Days of Exercise per Week:   . Minutes of Exercise per Session:   Stress:   . Feeling of Stress :   Social Connections:   . Frequency of Communication with Friends and Family:   . Frequency of Social Gatherings with Friends and Family:   . Attends Religious Services:   . Active Member of Clubs or Organizations:   . Attends Archivist Meetings:   Marland Kitchen Marital Status:     Family History  Problem Relation Age of Onset  . Stroke Mother 26  . Heart attack Father 38  . Coronary artery disease Brother        S/P CBAG ? @ 60  . Diabetes Brother   .  Hypertension Brother   . Kidney disease Neg Hx   . Cancer Neg Hx   . Colon cancer Neg Hx   . Colon polyps Neg Hx     Review of Systems     Objective:   Vitals:   02/14/20 1148  BP: 128/78  Pulse: 77  Temp: 98.1 F (36.7 C)  SpO2: 98%   BP Readings from Last 3 Encounters:  02/14/20 128/78  01/01/20 134/76  11/19/19 120/90  Wt Readings from Last 3 Encounters:  02/14/20 112 lb (50.8 kg)  01/01/20 113 lb (51.3 kg)  11/19/19 109 lb (49.4 kg)   Body mass index is 18.64 kg/m.   Physical Exam HENT:     Head: Normocephalic and atraumatic.     Jaw: There is normal jaw occlusion. Tenderness (Possibly minimal tenderness right lower, posterior jaw, pain with clenching down tight) present. No swelling or pain on movement.     Salivary Glands: Right salivary gland is not diffusely enlarged or tender. Left salivary gland is not diffusely enlarged or tender.     Mouth/Throat:     Mouth: Mucous membranes are moist. No oral lesions.     Dentition: Normal dentition (No obvious abnormal teeth were exposed bone). No gingival swelling.     Pharynx: No pharyngeal swelling.            Assessment & Plan:    See Problem List for Assessment and Plan of chronic medical problems.    This visit occurred during the SARS-CoV-2 public health emergency.  Safety protocols were in place, including screening questions prior to the visit, additional usage of staff PPE, and extensive cleaning of exam room while observing appropriate contact time as indicated for disinfecting solutions.

## 2020-02-14 ENCOUNTER — Encounter: Payer: Self-pay | Admitting: Internal Medicine

## 2020-02-14 ENCOUNTER — Ambulatory Visit: Payer: Medicare Other | Admitting: Internal Medicine

## 2020-02-14 ENCOUNTER — Other Ambulatory Visit: Payer: Self-pay

## 2020-02-14 ENCOUNTER — Telehealth: Payer: Self-pay | Admitting: Internal Medicine

## 2020-02-14 DIAGNOSIS — R6884 Jaw pain: Secondary | ICD-10-CM

## 2020-02-14 NOTE — Telephone Encounter (Signed)
Patient's son dropped off a parking placard.  Form has been completed &Placed in providers box to review and sign.

## 2020-02-14 NOTE — Assessment & Plan Note (Signed)
Acute Jaw pain for 3 weeks-intermittent.  She particularly feels this when she is eating or clenches down her jaw.  At rest she does not have pain No difficulty eating and no obvious teeth pain Last dental appointment 3 months ago She is on Prolia DDx dental infection, TMJ, osteonecrosis of the jaw No obvious infection or evidence of osteonecrosis Pain is not significant so I will not start any new medication Her son will make an appointment with her dentist for further evaluation we will go from there

## 2020-02-14 NOTE — Patient Instructions (Signed)
I would recommend seeing your dentist for further evaluation.

## 2020-02-17 NOTE — Telephone Encounter (Signed)
Form has been signed, Copy sent to scan.   Spoke with Romie Minus to inform, Mailed as requested.

## 2020-02-26 ENCOUNTER — Ambulatory Visit: Payer: Medicare Other | Admitting: Cardiovascular Disease

## 2020-03-02 ENCOUNTER — Other Ambulatory Visit: Payer: Self-pay | Admitting: Cardiovascular Disease

## 2020-03-02 ENCOUNTER — Telehealth: Payer: Self-pay | Admitting: Internal Medicine

## 2020-03-02 DIAGNOSIS — S22080D Wedge compression fracture of T11-T12 vertebra, subsequent encounter for fracture with routine healing: Secondary | ICD-10-CM

## 2020-03-02 MED ORDER — HYDROCODONE-ACETAMINOPHEN 5-325 MG PO TABS: 1.0000 | ORAL_TABLET | Freq: Two times a day (BID) | ORAL | 0 refills | Status: DC | PRN

## 2020-03-02 NOTE — Telephone Encounter (Signed)
Patient's son called requesting a refill on the following medication for the patient.  HYDROcodone-acetaminophen (NORCO/VICODIN) 5-325 MG tablet   [id number] [place], [place] - [place] Phone:  [phone]  Fax:  [phone]

## 2020-03-05 ENCOUNTER — Other Ambulatory Visit: Payer: Self-pay

## 2020-03-05 ENCOUNTER — Ambulatory Visit (INDEPENDENT_AMBULATORY_CARE_PROVIDER_SITE_OTHER): Payer: Medicare Other | Admitting: Ophthalmology

## 2020-03-05 ENCOUNTER — Encounter (INDEPENDENT_AMBULATORY_CARE_PROVIDER_SITE_OTHER): Payer: Self-pay | Admitting: Ophthalmology

## 2020-03-05 DIAGNOSIS — H353134 Nonexudative age-related macular degeneration, bilateral, advanced atrophic with subfoveal involvement: Secondary | ICD-10-CM

## 2020-03-05 DIAGNOSIS — H353212 Exudative age-related macular degeneration, right eye, with inactive choroidal neovascularization: Secondary | ICD-10-CM

## 2020-03-05 DIAGNOSIS — Z961 Presence of intraocular lens: Secondary | ICD-10-CM | POA: Diagnosis not present

## 2020-03-05 DIAGNOSIS — H353222 Exudative age-related macular degeneration, left eye, with inactive choroidal neovascularization: Secondary | ICD-10-CM | POA: Diagnosis not present

## 2020-03-05 NOTE — Progress Notes (Signed)
03/05/2020     CHIEF COMPLAINT Patient presents for Retina Follow Up   HISTORY OF PRESENT ILLNESS: [name] is a 86 y.o. female who presents to the clinic today for:   HPI    Retina Follow Up    Patient presents with  Dry AMD.  In both eyes.  This started 9 months ago.  Severity is mild.  Duration of 9 months.  Since onset it is stable.          Comments    9 Month AMD F/U OU  Pt denies noticeable changes to [place] OU since last visit. Pt denies ocular pain, flashes of light, or floaters OU.         Last edited by Rockie Neighbours, [place] on 03/05/2020  1:39 PM. (History)      Referring physician: Binnie Rail, MD [place],  [place] [address]  HISTORICAL INFORMATION:   Selected notes from the MEDICAL RECORD NUMBER    Lab Results  Component Value Date   HGBA1C 6.9 (H) 01/01/2020     CURRENT MEDICATIONS: No current outpatient medications on file. (Ophthalmic Drugs)   No current facility-administered medications for this visit. (Ophthalmic Drugs)   Current Outpatient Medications (Other)  Medication Sig  . acetaminophen (TYLENOL) 500 MG tablet Take 500 mg by mouth daily as needed for moderate pain.   Marland Kitchen apixaban (ELIQUIS) 2.5 MG TABS tablet Take 1 tablet (2.5 mg total) by mouth 2 (two) times daily.  . calcium carbonate (OS-CAL) 600 MG TABS tablet Take 600 mg by mouth 2 (two) times daily.   . Cholecalciferol (VITAMIN D3 SUPER STRENGTH) 50 MCG (2000 UT) TABS Take 2,000 Units by mouth daily with lunch.  . ciclopirox (LOPROX) 0.77 % cream Apply topically 2 (two) times daily.  Marland Kitchen HYDROcodone-acetaminophen (NORCO/VICODIN) 5-325 MG tablet Take 1 tablet by mouth every 12 (twelve) hours as needed for moderate pain. For chronic back pain  . Magnesium 400 MG TABS Take 400 mg by mouth daily with lunch.  . metoprolol succinate (TOPROL-XL) 50 MG 24 hr tablet TAKE 1 TABLET (50 MG TOTAL) BY MOUTH 2 (TWO) TIMES DAILY. TAKE WITH OR IMMEDIATELY FOLLOWING A MEAL.  .  multivitamin-lutein (OCUVITE-LUTEIN) CAPS capsule Take 1 capsule by mouth daily with lunch.   . Omega-3 Fatty Acids (FISH OIL) 1000 MG CAPS Take 1,000 mg by mouth daily with lunch.   . tretinoin (RETIN-A) 0.05 % cream Apply 1 application topically daily.    No current facility-administered medications for this visit. (Other)      REVIEW OF SYSTEMS:    ALLERGIES No Known Allergies  PAST MEDICAL HISTORY Past Medical History:  Diagnosis Date  . Anemia   . Arthritis    "back" (01/16/2018)  . Atrial fibrillation ([place])   . Cancer [facility])    low grade papillary, most recent recurrence 2011,  Dr Karsten Ro  . 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 History of blood transfusion 01/15/2018   S/P OR  . Hypertension   . Hyponatremia 01/26/12   Na+ 131  . Macular degeneration, bilateral    "were wet; dry now" (01/16/2018)  . Other abnormal glucose 06/14/12   Fasting blood glucose 128; A1c 5.8% on 12/15/11   Past Surgical History:  Procedure Laterality Date  . ABDOMINAL HYSTERECTOMY  1970s   with oopherectomy for incidenctal cyst (no PMH of abnormal PAP)  . APPENDECTOMY     1970s  . BLADDER TUMOR EXCISION  2011 &  2013   Dr Karsten Ro  . CATARACT EXTRACTION, BILATERAL  2014   Dr Herbert Deaner  . COLONOSCOPY     negative; Dr Olevia Perches  . CYSTOSCOPY      multiple since tumor excision;Dr Ottelin  . DILATION AND CURETTAGE OF UTERUS    . FRACTURE SURGERY    . INTRAMEDULLARY (IM) NAIL INTERTROCHANTERIC Right 01/15/2018   Procedure: INTRAMEDULLARY (IM) NAIL INTERTROCHANTRIC;  Surgeon: Melrose Nakayama, MD;  Location: [place];  Service: Orthopedics;  Laterality: Right;  . KYPHOPLASTY N/A 03/08/2018   Procedure: THORACIC 12 KYPHOPLASTY;  Surgeon: Phylliss Bob, MD;  Location: [place];  Service: Orthopedics;  Laterality: N/A;  . TONSILLECTOMY      FAMILY HISTORY Family History  Problem Relation Age of Onset  . Stroke Mother 51  . Heart attack Father 15  . Coronary artery disease Brother         S/P CBAG ? @ 60  . Diabetes Brother   . Hypertension Brother   . Kidney disease Neg Hx   . Cancer Neg Hx   . Colon cancer Neg Hx   . Colon polyps Neg Hx     SOCIAL HISTORY Social History   Tobacco Use  . Smoking status: Never Smoker  . Smokeless tobacco: Never Used  Vaping Use  . Vaping Use: Never used  Substance Use Topics  . Alcohol use: Never    Alcohol/week: 0.0 standard drinks  . Drug use: Never         OPHTHALMIC EXAM: Base Eye Exam    Visual Acuity (ETDRS)      Right Left   Dist [place] CF @ 2' 20/400   Dist ph [place] NI 20/80 +2       Tonometry (Tonopen, 1:44 PM)      Right Left   Pressure 10 11       Pupils      Pupils Dark Light Shape React APD   Right PERRL 4 3 Round Brisk None   Left PERRL 4 3 Round Brisk None       Visual Fields (Counting fingers)      Left Right    Full Full       Extraocular Movement      Right Left    Full Full       Neuro/Psych    Oriented x3: Yes   Mood/Affect: Normal       Dilation    Both eyes: 1.0% Mydriacyl, 2.5% Phenylephrine @ 1:47 PM        Slit Lamp and Fundus Exam    External Exam      Right Left   External Normal Normal       Slit Lamp Exam      Right Left   Lids/Lashes Normal Normal   Conjunctiva/Sclera White and quiet White and quiet   Cornea Clear Clear   Anterior Chamber Deep and quiet Deep and quiet   Iris Round and reactive Round and reactive   Lens Posterior chamber intraocular lens Posterior chamber intraocular lens   Anterior Vitreous Normal Normal       Fundus Exam      Right Left   Posterior Vitreous Normal Posterior vitreous detachment   Disc Normal Normal   C/D Ratio 0.05 0.1   Macula Choroidal atrophy, 8DA size in center Geographic atrophy, small island of fovea RPE   Vessels Normal Normal   Periphery Normal Normal          IMAGING AND PROCEDURES  Imaging  and Procedures for 03/05/20  OCT, Retina - OU - Both Eyes       Right Eye Quality was good. Scan locations  included subfoveal. Central Foveal Thickness: 266. Progression has been stable. Findings include central retinal atrophy, outer retinal atrophy, inner retinal atrophy, abnormal foveal contour.   Left Eye Quality was good. Scan locations included subfoveal. Central Foveal Thickness: 266. Progression has been stable. Findings include inner retinal atrophy, central retinal atrophy, outer retinal atrophy, abnormal foveal contour.   Notes Bilateral dry atrophic ARMD with loss of the outer retinal photo receptors and choriocapillaris dropout                ASSESSMENT/PLAN:  No problem-specific Assessment & Plan notes found for this encounter.      ICD-10-CM   1. Advanced nonexudative age-related macular degeneration of both eyes with subfoveal involvement  H35.3134 OCT, Retina - OU - Both Eyes  2. Exudative age-related macular degeneration of left eye with inactive choroidal neovascularization (HCC)  H35.3222 OCT, Retina - OU - Both Eyes  3. Exudative age-related macular degeneration of right eye with inactive choroidal neovascularization (HCC)  H35.3212 OCT, Retina - OU - Both Eyes  4. Pseudophakia  Z96.1     1.  2.  3.  Ophthalmic Meds Ordered this visit:  No orders of the defined types were placed in this encounter.      Return in about 1 year (around 03/05/2021) for DILATE OU, OCT.  There are no Patient Instructions on file for this visit.   Explained the diagnoses, plan, and follow up with the patient and they expressed understanding.  Patient expressed understanding of the importance of proper follow up care.   Clent Demark Halei [place] M.D. Diseases & Surgery of the Retina and Vitreous Retina & Diabetic [place] 03/05/20     Abbreviations: M myopia (nearsighted); A astigmatism; H hyperopia (farsighted); P presbyopia; Mrx spectacle prescription;  CTL contact lenses; OD right eye; OS left eye; OU both eyes  XT exotropia; ET esotropia; PEK punctate epithelial keratitis;  PEE punctate epithelial erosions; DES dry eye syndrome; MGD meibomian gland dysfunction; ATs artificial tears; PFAT's preservative free artificial tears; [place] nuclear sclerotic cataract; PSC posterior subcapsular cataract; ERM epi-retinal membrane; PVD posterior vitreous detachment; RD retinal detachment; DM diabetes mellitus; DR diabetic retinopathy; NPDR non-proliferative diabetic retinopathy; PDR proliferative diabetic retinopathy; CSME clinically significant macular edema; DME diabetic macular edema; dbh dot blot hemorrhages; CWS cotton wool spot; POAG primary open angle glaucoma; C/D cup-to-disc ratio; HVF humphrey visual field; GVF goldmann visual field; OCT optical coherence tomography; IOP intraocular pressure; BRVO Branch retinal vein occlusion; CRVO central retinal vein occlusion; CRAO central retinal artery occlusion; BRAO branch retinal artery occlusion; RT retinal tear; SB scleral buckle; PPV pars plana vitrectomy; VH Vitreous hemorrhage; PRP panretinal laser photocoagulation; IVK intravitreal kenalog; VMT vitreomacular traction; MH Macular hole;  NVD neovascularization of the disc; NVE neovascularization elsewhere; AREDS age related eye disease study; ARMD age related macular degeneration; POAG primary open angle glaucoma; EBMD epithelial/anterior basement membrane dystrophy; ACIOL anterior chamber intraocular lens; IOL intraocular lens; PCIOL posterior chamber intraocular lens; Phaco/IOL phacoemulsification with intraocular lens placement; [place] photorefractive keratectomy; LASIK laser assisted in situ keratomileusis; HTN hypertension; DM diabetes mellitus; COPD chronic obstructive pulmonary disease

## 2020-03-26 ENCOUNTER — Ambulatory Visit: Payer: Medicare Other | Admitting: Cardiovascular Disease

## 2020-03-30 ENCOUNTER — Telehealth: Payer: Self-pay | Admitting: Internal Medicine

## 2020-03-30 DIAGNOSIS — S22080D Wedge compression fracture of T11-T12 vertebra, subsequent encounter for fracture with routine healing: Secondary | ICD-10-CM

## 2020-03-30 MED ORDER — HYDROCODONE-ACETAMINOPHEN 5-325 MG PO TABS: 1.0000 | ORAL_TABLET | Freq: Two times a day (BID) | ORAL | 0 refills | Status: DC | PRN

## 2020-03-30 NOTE — Telephone Encounter (Signed)
Check [place] registry last filled 03/02/2020.Marland KitchenJohny Chess

## 2020-03-30 NOTE — Telephone Encounter (Signed)
HYDROcodone-acetaminophen (NORCO/VICODIN) 5-325 MG tablet  [id number] [place], [place] - [place] Phone:  [phone]  Fax:  [phone]

## 2020-04-08 ENCOUNTER — Ambulatory Visit (INDEPENDENT_AMBULATORY_CARE_PROVIDER_SITE_OTHER): Payer: Medicare Other

## 2020-04-08 ENCOUNTER — Other Ambulatory Visit: Payer: Self-pay

## 2020-04-08 DIAGNOSIS — Z23 Encounter for immunization: Secondary | ICD-10-CM

## 2020-04-09 ENCOUNTER — Telehealth: Payer: Self-pay | Admitting: Internal Medicine

## 2020-04-09 NOTE — Telephone Encounter (Signed)
Patients son [name] called and is requesting a consult with Dr. Quay Burow without his mother being present. He said that he would like to talk about some of the medications that she is taking.    [name] can be reached at: [phone]

## 2020-04-10 NOTE — Telephone Encounter (Signed)
Can you see if he will tell you what he wanted to discuss.  It most likely will not be able to call him today, but may be able to call on Monday

## 2020-04-10 NOTE — Telephone Encounter (Signed)
Called pt/son gave him MD response. Son states he want to talk w/MD concerning mom medications, and some other things that is going on. He has made appt for 04/14/20 to discuss w/MD../lmb

## 2020-04-13 NOTE — Progress Notes (Signed)
Subjective:    Patient ID: [name], female    DOB: [birth year], 86 y.o.   MRN: [id number]  HPI The patient is here for an acute visit.  Her son with with her.  Her son would like to discuss her medications - she has had some increased stress and feels she has some anxiety and depression.  She has had some sundowning.    She fell three days ago and luckily landed on carpet and did not hurt herself.  She has remaining bruises on her face.  She had a little neck pain but FROM and no radiculopathy.  She had no LOC.    He would like to consider a low dose SSRI.   Medications and allergies reviewed with patient and updated if appropriate.  Patient Active Problem List   Diagnosis Date Noted  . Pseudophakia 03/05/2020  . Advanced nonexudative age-related macular degeneration of both eyes with subfoveal involvement 03/05/2020  . Jaw pain 02/14/2020  . B12 deficiency 01/01/2020  . Infected skin tear 09/09/2019  . Tinea corporis 09/09/2019  . Hypomagnesemia 07/01/2019  . Hypophosphatemia 07/01/2019  . Metabolic acidosis 13/02/6577  . Atrial fibrillation with RVR ([place]) 06/19/2019  . DNR (do not resuscitate) 06/19/2019  . Syncope 06/17/2019  . Memory difficulties 12/21/2018  . Acute cystitis without hematuria 12/21/2018  . T12 compression fracture ([place]) 04/24/2018  . Femur fracture, right ([place]) 01/14/2018  . Macular degeneration (senile) of retina 06/14/2010  . Vitamin D deficiency 03/10/2009  . Osteoporosis 03/10/2009  . NEOPLASM, MALIGNANT, BLADDER, HX OF 03/10/2009  . 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  . Diabetes mellitus without complication ([place]) 46/96/2952  . Essential hypertension 08/09/2007  . Paroxysmal atrial fibrillation ([place]) 08/09/2007    Current Outpatient Medications on File Prior to Visit  Medication Sig Dispense Refill  . acetaminophen (TYLENOL) 500 MG tablet Take 500 mg by mouth daily as needed for moderate pain.     Marland Kitchen apixaban (ELIQUIS) 2.5 MG TABS tablet  Take 1 tablet (2.5 mg total) by mouth 2 (two) times daily. 60 tablet 5  . calcium carbonate (OS-CAL) 600 MG TABS tablet Take 600 mg by mouth 2 (two) times daily.     . Cholecalciferol (VITAMIN D3 SUPER STRENGTH) 50 MCG (2000 UT) TABS Take 2,000 Units by mouth daily with lunch.    . ciclopirox (LOPROX) 0.77 % cream Apply topically 2 (two) times daily. 30 g 1  . HYDROcodone-acetaminophen (NORCO/VICODIN) 5-325 MG tablet Take 1 tablet by mouth every 12 (twelve) hours as needed for moderate pain. For chronic back pain 60 tablet 0  . Magnesium 400 MG TABS Take 400 mg by mouth daily with lunch.    . metoprolol succinate (TOPROL-XL) 50 MG 24 hr tablet TAKE 1 TABLET (50 MG TOTAL) BY MOUTH 2 (TWO) TIMES DAILY. TAKE WITH OR IMMEDIATELY FOLLOWING A MEAL. 180 tablet 2  . multivitamin-lutein (OCUVITE-LUTEIN) CAPS capsule Take 1 capsule by mouth daily with lunch.     . Omega-3 Fatty Acids (FISH OIL) 1000 MG CAPS Take 1,000 mg by mouth daily with lunch.     . tretinoin (RETIN-A) 0.05 % cream Apply 1 application topically daily.      No current facility-administered medications on file prior to visit.    Past Medical History:  Diagnosis Date  . Anemia   . Arthritis    "back" (01/16/2018)  . Atrial fibrillation ([place])   . Cancer [facility])    low grade papillary, most recent recurrence 2011,  Dr Karsten Ro  .  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 History of blood transfusion 01/15/2018   S/P OR  . Hypertension   . Hyponatremia 01/26/12   Na+ 131  . Macular degeneration, bilateral    "were wet; dry now" (01/16/2018)  . Other abnormal glucose 06/14/12   Fasting blood glucose 128; A1c 5.8% on 12/15/11    Past Surgical History:  Procedure Laterality Date  . ABDOMINAL HYSTERECTOMY  1970s   with oopherectomy for incidenctal cyst (no PMH of abnormal PAP)  . APPENDECTOMY     1970s  . BLADDER TUMOR EXCISION  2011 & 2013   Dr Karsten Ro  . CATARACT EXTRACTION, BILATERAL  2014   Dr Herbert Deaner  .  COLONOSCOPY     negative; Dr Olevia Perches  . CYSTOSCOPY      multiple since tumor excision;Dr Ottelin  . DILATION AND CURETTAGE OF UTERUS    . FRACTURE SURGERY    . INTRAMEDULLARY (IM) NAIL INTERTROCHANTERIC Right 01/15/2018   Procedure: INTRAMEDULLARY (IM) NAIL INTERTROCHANTRIC;  Surgeon: Melrose Nakayama, MD;  Location: [place];  Service: Orthopedics;  Laterality: Right;  . KYPHOPLASTY N/A 03/08/2018   Procedure: THORACIC 12 KYPHOPLASTY;  Surgeon: Phylliss Bob, MD;  Location: [place];  Service: Orthopedics;  Laterality: N/A;  . TONSILLECTOMY      Social History   Socioeconomic History  . Marital status: Widowed    Spouse name: Not on file  . Number of children: 2  . Years of education: Not on file  . Highest education level: Not on file  Occupational History  . Occupation: Retired  Tobacco Use  . Smoking status: Never Smoker  . Smokeless tobacco: Never Used  Vaping Use  . Vaping Use: Never used  Substance and Sexual Activity  . Alcohol use: Never    Alcohol/week: 0.0 standard drinks  . Drug use: Never  . Sexual activity: Never  Other Topics Concern  . Not on file  Social History Narrative  . Not on file   Social Determinants of Health   Financial Resource Strain:   . Difficulty of Paying Living Expenses: Not on file  Food Insecurity:   . Worried About Charity fundraiser in the Last Year: Not on file  . Ran Out of Food in the Last Year: Not on file  Transportation Needs:   . Lack of Transportation (Medical): Not on file  . Lack of Transportation (Non-Medical): Not on file  Physical Activity:   . Days of Exercise per Week: Not on file  . Minutes of Exercise per Session: Not on file  Stress:   . Feeling of Stress : Not on file  Social Connections:   . Frequency of Communication with Friends and Family: Not on file  . Frequency of Social Gatherings with Friends and Family: Not on file  . Attends Religious Services: Not on file  . Active Member of Clubs or Organizations:  Not on file  . Attends Archivist Meetings: Not on file  . Marital Status: Not on file    Family History  Problem Relation Age of Onset  . Stroke Mother 74  . Heart attack Father 90  . Coronary artery disease Brother        S/P CBAG ? @ 60  . Diabetes Brother   . Hypertension Brother   . Kidney disease Neg Hx   . Cancer Neg Hx   . Colon cancer Neg Hx   . Colon polyps Neg Hx     Review of  Systems     Objective:   Vitals:   04/14/20 1452  BP: (!) 146/84  Pulse: (!) 110  Temp: 98.7 F (37.1 C)  SpO2: 97%   BP Readings from Last 3 Encounters:  04/14/20 (!) 146/84  02/14/20 128/78  01/01/20 134/76   Wt Readings from Last 3 Encounters:  04/14/20 118 lb 3.2 oz (53.6 kg)  02/14/20 112 lb (50.8 kg)  01/01/20 113 lb (51.3 kg)   Body mass index is 19.67 kg/m.   Physical Exam Constitutional:      Appearance: Normal appearance.  HENT:     Head:     Comments: Bruises on nasal bridge and under eyes b/l Skin:    General: Skin is warm and dry.  Neurological:     Mental Status: She is alert.  Psychiatric:        Mood and Affect: Mood normal.        Behavior: Behavior normal.            Assessment & Plan:    See Problem List for Assessment and Plan of chronic medical problems.    This visit occurred during the SARS-CoV-2 public health emergency.  Safety protocols were in place, including screening questions prior to the visit, additional usage of staff PPE, and extensive cleaning of exam room while observing appropriate contact time as indicated for disinfecting solutions.

## 2020-04-14 ENCOUNTER — Ambulatory Visit: Payer: Medicare Other | Admitting: Internal Medicine

## 2020-04-14 ENCOUNTER — Other Ambulatory Visit: Payer: Self-pay

## 2020-04-14 ENCOUNTER — Encounter: Payer: Self-pay | Admitting: Internal Medicine

## 2020-04-14 DIAGNOSIS — F329 Major depressive disorder, single episode, unspecified: Secondary | ICD-10-CM

## 2020-04-14 DIAGNOSIS — F32A Depression, unspecified: Secondary | ICD-10-CM | POA: Insufficient documentation

## 2020-04-14 DIAGNOSIS — F419 Anxiety disorder, unspecified: Secondary | ICD-10-CM

## 2020-04-14 MED ORDER — SERTRALINE HCL 25 MG PO TABS
25.0000 mg | ORAL_TABLET | Freq: Every day | ORAL | 1 refills | Status: DC
Start: 2020-04-14 — End: 2020-08-24

## 2020-04-14 NOTE — Patient Instructions (Signed)
Lets try sertraline 25 mg daily.

## 2020-04-14 NOTE — Assessment & Plan Note (Signed)
Acute Having some anxiety, depression and sundowning Agree with low dose SSRI Start sertraline 25 mg daily

## 2020-04-23 NOTE — Progress Notes (Signed)
Cardiology Office Note:   Date:  04/24/2020  NAME:  [name]    MRN: [id number] DOB:  24-Aug-1929   PCP:  Binnie Rail, MD  Cardiologist:  Evalina Field, MD  Electrophysiologist:  None   Referring MD: Binnie Rail, MD   Chief Complaint  Patient presents with  . Follow-up   History of Present Illness:   [name] is a 86 y.o. female with a hx of atrial fibrillation, dementia, HTN, carotid artery disease who presents for follow-up.  She presents with her son for follow-up.  No syncopal events recently.  Appears to be having issues maintaining adequate hydration.  He has reminder frequently.  She had a fall last week apparently related to some construction work done in the house.  But no syncopal events like she had in the past.  Her heart rate is 99.  Heart rate seems to be controlled on her metoprolol medication.  She remains on Eliquis 2.5 mg twice daily without any significant bleeding.  She was started on an SSRI due to behavioral disturbances in the setting of dementia by her primary care physician.  This was started last week.  She has severe macular degeneration as well as dementia.  I recommend against driving.  Her primary care physician is also recommended against driving.  She has not been driving since her last visit.  She denies any chest pain, shortness of breath or palpitations in the office today.  Problem List 1. Persistent atrial fibrillation on eliquis(diagnosed 05/2019 in setting of fall) 2. Hypertension 3. Dementia 4. Carotid artery disease -1-39% R ICA -1-39% L ICA  Past Medical History: Past Medical History:  Diagnosis Date  . Anemia   . Arthritis    "back" (01/16/2018)  . Atrial fibrillation ([place])   . Cancer [facility])    low grade papillary, most recent recurrence 2011,  Dr Karsten Ro  . 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 History of blood transfusion 01/15/2018   S/P OR  . Hypertension   . Hyponatremia 01/26/12   Na+ 131  .  Macular degeneration, bilateral    "were wet; dry now" (01/16/2018)  . Other abnormal glucose 06/14/12   Fasting blood glucose 128; A1c 5.8% on 12/15/11    Past Surgical History: Past Surgical History:  Procedure Laterality Date  . ABDOMINAL HYSTERECTOMY  1970s   with oopherectomy for incidenctal cyst (no PMH of abnormal PAP)  . APPENDECTOMY     1970s  . BLADDER TUMOR EXCISION  2011 & 2013   Dr Karsten Ro  . CATARACT EXTRACTION, BILATERAL  2014   Dr Herbert Deaner  . COLONOSCOPY     negative; Dr Olevia Perches  . CYSTOSCOPY      multiple since tumor excision;Dr Ottelin  . DILATION AND CURETTAGE OF UTERUS    . FRACTURE SURGERY    . INTRAMEDULLARY (IM) NAIL INTERTROCHANTERIC Right 01/15/2018   Procedure: INTRAMEDULLARY (IM) NAIL INTERTROCHANTRIC;  Surgeon: Melrose Nakayama, MD;  Location: [facility];  Service: Orthopedics;  Laterality: Right;  . KYPHOPLASTY N/A 03/08/2018   Procedure: THORACIC 12 KYPHOPLASTY;  Surgeon: Phylliss Bob, MD;  Location: [facility];  Service: Orthopedics;  Laterality: N/A;  . TONSILLECTOMY      Current Medications: Current Meds  Medication Sig  . acetaminophen (TYLENOL) 500 MG tablet Take 500 mg by mouth daily as needed for moderate pain.   Marland Kitchen apixaban (ELIQUIS) 2.5 MG TABS tablet Take 1 tablet (2.5 mg total) by mouth 2 (two) times daily.  Marland Kitchen  calcium carbonate (OS-CAL) 600 MG TABS tablet Take 600 mg by mouth 2 (two) times daily.   . Cholecalciferol (VITAMIN D3 SUPER STRENGTH) 50 MCG (2000 UT) TABS Take 2,000 Units by mouth daily with lunch.  . ciclopirox (LOPROX) 0.77 % cream Apply topically 2 (two) times daily.  Marland Kitchen HYDROcodone-acetaminophen (NORCO/VICODIN) 5-325 MG tablet Take 1 tablet by mouth every 12 (twelve) hours as needed for moderate pain. For chronic back pain  . Magnesium 400 MG TABS Take 400 mg by mouth daily with lunch.  . metoprolol succinate (TOPROL-XL) 50 MG 24 hr tablet TAKE 1 TABLET (50 MG TOTAL) BY MOUTH 2 (TWO) TIMES DAILY. TAKE WITH OR IMMEDIATELY FOLLOWING A MEAL.  .  multivitamin-lutein (OCUVITE-LUTEIN) CAPS capsule Take 1 capsule by mouth daily with lunch.   . Omega-3 Fatty Acids (FISH OIL) 1000 MG CAPS Take 1,000 mg by mouth daily with lunch.   . sertraline (ZOLOFT) 25 MG tablet Take 1 tablet (25 mg total) by mouth daily.  Marland Kitchen tretinoin (RETIN-A) 0.05 % cream Apply 1 application topically daily.      Allergies:    Patient has no known allergies.   Social History: Social History   Socioeconomic History  . Marital status: Widowed    Spouse name: Not on file  . Number of children: 2  . Years of education: Not on file  . Highest education level: Not on file  Occupational History  . Occupation: Retired  Tobacco Use  . Smoking status: Never Smoker  . Smokeless tobacco: Never Used  Vaping Use  . Vaping Use: Never used  Substance and Sexual Activity  . Alcohol use: Never    Alcohol/week: 0.0 standard drinks  . Drug use: Never  . Sexual activity: Never  Other Topics Concern  . Not on file  Social History Narrative  . Not on file   Social Determinants of Health   Financial Resource Strain:   . Difficulty of Paying Living Expenses: Not on file  Food Insecurity:   . Worried About Charity fundraiser in the Last Year: Not on file  . Ran Out of Food in the Last Year: Not on file  Transportation Needs:   . Lack of Transportation (Medical): Not on file  . Lack of Transportation (Non-Medical): Not on file  Physical Activity:   . Days of Exercise per Week: Not on file  . Minutes of Exercise per Session: Not on file  Stress:   . Feeling of Stress : Not on file  Social Connections:   . Frequency of Communication with Friends and Family: Not on file  . Frequency of Social Gatherings with Friends and Family: Not on file  . Attends Religious Services: Not on file  . Active Member of Clubs or Organizations: Not on file  . Attends Archivist Meetings: Not on file  . Marital Status: Not on file     Family History: The patient's family  history includes Coronary artery disease in her brother; Diabetes in her brother; Heart attack (age of onset: 66) in her father; Hypertension in her brother; Stroke (age of onset: 1) in her mother. There is no history of Kidney disease, Cancer, Colon cancer, or Colon polyps.  ROS:   All other ROS reviewed and negative. Pertinent positives noted in the HPI.     EKGs/Labs/Other Studies Reviewed:   The following studies were personally reviewed by me today:   TTE 06/18/2019 1. Left ventricular ejection fraction, by visual estimation, is 50 to  55%.  The left ventricle has low normal function. Left ventricular septal  wall thickness was mildly increased. There is no left ventricular  hypertrophy.  2. Global right ventricle has mildly reduced systolic function.The right  ventricular size is mildly enlarged. No increase in right ventricular wall  thickness.  3. Left atrial size was normal.  4. Right atrial size was moderately dilated.  5. The pericardial effusion is lateral to the left ventricle.  6. Trivial pericardial effusion is present.  7. Moderate calcification of the mitral valve leaflet(s).  8. Moderate mitral annular calcification.  9. Moderate thickening of the mitral valve leaflet(s).  10. The mitral valve is degenerative. Mild mitral valve regurgitation.  11. The tricuspid valve is normal in structure. Tricuspid valve  regurgitation moderate.  12. The aortic valve is tricuspid. Aortic valve regurgitation is mild.  sclerosis without stenosis.  13. There is Moderate calcification of the aortic valve.  14. There is Moderate thickening of the aortic valve.  15. The pulmonic valve was not well visualized. Pulmonic valve  regurgitation is mild.  16. Shadowing artifact normal diameter.  17. Normal pulmonary artery systolic pressure.    VASC US 06/18/2019 Right Carotid: Velocities in the right ICA are consistent with a 1-39%  stenosis.   Left Carotid: Velocities in the  left ICA are consistent with a 1-39%  stenosis.   Vertebrals: Bilateral vertebral arteries demonstrate antegrade flow.  Subclavians: Normal flow hemodynamics were seen in bilateral subclavian        arteries.   Recent Labs: 07/02/2019: Magnesium 2.1 01/01/2020: ALT 27; BUN 21; Creatinine, Ser 0.76; Hemoglobin 12.7; Platelets 277.0; Potassium 4.3; Sodium 136; TSH 0.56   Recent Lipid Panel    Component Value Date/Time   CHOL 209 (H) 01/24/2017 0956   TRIG 68.0 01/24/2017 0956   HDL 80.00 01/24/2017 0956   CHOLHDL 3 01/24/2017 0956   VLDL 13.6 01/24/2017 0956   LDLCALC 115 (H) 01/24/2017 0956   LDLDIRECT 122.9 06/14/2010 0912    Physical Exam:   VS:  BP 133/80   Pulse 99   Ht 5\' 3"  (1.6 m)   Wt 115 lb 12.8 oz (52.5 kg)   SpO2 100%   BMI 20.51 kg/m    Wt Readings from Last 3 Encounters:  04/24/20 115 lb 12.8 oz (52.5 kg)  04/14/20 118 lb 3.2 oz (53.6 kg)  02/14/20 112 lb (50.8 kg)    General: Well nourished, well developed, in no acute distress Heart: Atraumatic, normal size  Eyes: PEERLA, EOMI  Neck: Supple, no JVD Endocrine: No thryomegaly Cardiac: Normal S1, S2; RRR; no murmurs, rubs, or gallops Lungs: Clear to auscultation bilaterally, no wheezing, rhonchi or rales  Abd: Soft, nontender, no hepatomegaly  Ext: No edema, pulses 2+ Musculoskeletal: No deformities, BUE and BLE strength normal and equal Skin: Warm and dry, no rashes   Neuro: Alert and oriented to person, place, time, and situation, CNII-XII grossly intact, no focal deficits  Psych: Normal mood and affect   ASSESSMENT:   [name] is a 86 y.o. female who presents for the following: 1. Persistent atrial fibrillation ([place])   2. Essential hypertension   3. Venous insufficiency     PLAN:   1. Persistent atrial fibrillation (HCC) -CHADSVASC=4.  On Eliquis 2.5 mg twice daily.  No significant bleeding.  Only 1 fall recently.  Okay to continue Eliquis for now.  We have pursued rate control  strategy as she has no symptoms.  She will continue metoprolol succinate 50 mg twice  daily.  Rates controlled today.  2. Essential hypertension -Well-controlled today.  No change in medication.  3. Venous insufficiency -Leg elevation recommended.   Disposition: Return in about 1 year (around 04/24/2021).  Medication Adjustments/Labs and Tests Ordered: Current medicines are reviewed at length with the patient today.  Concerns regarding medicines are outlined above.  No orders of the defined types were placed in this encounter.  No orders of the defined types were placed in this encounter.   Patient Instructions  Medication Instructions:  The current medical regimen is effective;  continue present plan and medications.  *If you need a refill on your cardiac medications before your next appointment, please call your pharmacy*   Follow-Up: At [facility], you and your health needs are our priority.  As part of our continuing mission to provide you with exceptional heart care, we have created designated Provider Care Teams.  These Care Teams include your primary Cardiologist (physician) and Advanced Practice Providers (APPs -  Physician Assistants and Nurse Practitioners) who all work together to provide you with the care you need, when you need it.  We recommend signing up for the patient portal called "MyChart".  Sign up information is provided on this After Visit Summary.  MyChart is used to connect with patients for Virtual Visits (Telemedicine).  Patients are able to view lab/test results, encounter notes, upcoming appointments, etc.  Non-urgent messages can be sent to your provider as well.   To learn more about what you can do with MyChart, go to [email or web address].    Your next appointment:   12 month(s)  The format for your next appointment:   In Person  Provider:   Eleonore Chiquito, MD         Time Spent with Patient: I have spent a total of 25 minutes with  patient reviewing hospital notes, telemetry, EKGs, labs and examining the patient as well as establishing an assessment and plan that was discussed with the patient.  > 50% of time was spent in direct patient care.  Signed, Addison Naegeli. Audie Box, [place]  [address] [place], [place] [address] [phone]  04/24/2020 4:30 PM

## 2020-04-24 ENCOUNTER — Other Ambulatory Visit: Payer: Self-pay

## 2020-04-24 ENCOUNTER — Ambulatory Visit: Payer: Medicare Other | Admitting: Cardiovascular Disease

## 2020-04-24 ENCOUNTER — Encounter: Payer: Self-pay | Admitting: Cardiovascular Disease

## 2020-04-24 VITALS — BP 133/80 | HR 99 | Ht 63.0 in | Wt 115.8 lb

## 2020-04-24 DIAGNOSIS — I872 Venous insufficiency (chronic) (peripheral): Secondary | ICD-10-CM

## 2020-04-24 DIAGNOSIS — I4819 Other persistent atrial fibrillation: Secondary | ICD-10-CM | POA: Diagnosis not present

## 2020-04-24 DIAGNOSIS — I10 Essential (primary) hypertension: Secondary | ICD-10-CM

## 2020-04-24 NOTE — Patient Instructions (Signed)

## 2020-04-28 ENCOUNTER — Telehealth: Payer: Self-pay | Admitting: Internal Medicine

## 2020-04-28 DIAGNOSIS — S22080D Wedge compression fracture of T11-T12 vertebra, subsequent encounter for fracture with routine healing: Secondary | ICD-10-CM

## 2020-04-28 MED ORDER — HYDROCODONE-ACETAMINOPHEN 5-325 MG PO TABS: 1.0000 | ORAL_TABLET | Freq: Two times a day (BID) | ORAL | 0 refills | Status: DC | PRN

## 2020-04-28 NOTE — Telephone Encounter (Signed)
    1.Medication Requested:HYDROcodone-acetaminophen (NORCO/VICODIN) 5-325 MG tablet  2. Pharmacy (Name, Street, City):[id number] - [place], [place] - [address]  3. On Med List: yes  4. Last Visit with PCP: 04/14/20  5. Next visit date with PCP: 10/12/20   Agent: Please be advised that RX refills may take up to 3 business days. We ask that you follow-up with your pharmacy.  

## 2020-05-29 ENCOUNTER — Telehealth: Payer: Self-pay | Admitting: Internal Medicine

## 2020-05-29 DIAGNOSIS — S22080D Wedge compression fracture of T11-T12 vertebra, subsequent encounter for fracture with routine healing: Secondary | ICD-10-CM

## 2020-05-29 MED ORDER — HYDROCODONE-ACETAMINOPHEN 5-325 MG PO TABS: 1.0000 | ORAL_TABLET | Freq: Two times a day (BID) | ORAL | 0 refills | Status: DC | PRN

## 2020-05-29 NOTE — Telephone Encounter (Signed)
    1.Medication Requested:HYDROcodone-acetaminophen (NORCO/VICODIN) 5-325 MG tablet  2. Pharmacy (Name, Street, City):[id number] - [place], [place]  3. On Med List: yes  4. Last Visit with PCP: 04/14/20  5. Next visit date with PCP: 10/12/20   Agent: Please be advised that RX refills may take up to 3 business days. We ask that you follow-up with your pharmacy.

## 2020-06-26 ENCOUNTER — Telehealth: Payer: Self-pay | Admitting: Internal Medicine

## 2020-06-26 DIAGNOSIS — S22080D Wedge compression fracture of T11-T12 vertebra, subsequent encounter for fracture with routine healing: Secondary | ICD-10-CM

## 2020-06-26 MED ORDER — HYDROCODONE-ACETAMINOPHEN 5-325 MG PO TABS: 1.0000 | ORAL_TABLET | Freq: Two times a day (BID) | ORAL | 0 refills | Status: DC | PRN

## 2020-06-26 NOTE — Telephone Encounter (Signed)
   1.Medication Requested: HYDROcodone-acetaminophen (NORCO/VICODIN) 5-325 MG tablet  2. Pharmacy (Name, Street, City):[id number] [place], [place]  3. On Med List: yes  4. Last Visit with PCP: 04/14/20  5. Next visit date with PCP: 10/12/20   Agent: Please be advised that RX refills may take up to 3 business days. We ask that you follow-up with your pharmacy.

## 2020-07-03 ENCOUNTER — Ambulatory Visit (INDEPENDENT_AMBULATORY_CARE_PROVIDER_SITE_OTHER): Payer: Medicare Other

## 2020-07-03 ENCOUNTER — Other Ambulatory Visit: Payer: Self-pay

## 2020-07-03 DIAGNOSIS — M810 Age-related osteoporosis without current pathological fracture: Secondary | ICD-10-CM | POA: Diagnosis not present

## 2020-07-03 MED ORDER — DENOSUMAB 60 MG/ML ~~LOC~~ SOSY
60.0000 mg | PREFILLED_SYRINGE | Freq: Once | SUBCUTANEOUS | Status: AC
  Administered 2020-07-03: 60 mg via SUBCUTANEOUS

## 2020-07-03 NOTE — Progress Notes (Signed)
Prolia injection given.

## 2020-07-15 ENCOUNTER — Other Ambulatory Visit: Payer: Self-pay

## 2020-07-16 ENCOUNTER — Ambulatory Visit (INDEPENDENT_AMBULATORY_CARE_PROVIDER_SITE_OTHER): Payer: Medicare Other

## 2020-07-16 VITALS — BP 138/80 | HR 98 | Temp 97.9°F | Ht 63.0 in | Wt 116.6 lb

## 2020-07-16 DIAGNOSIS — Z0000 Encounter for general adult medical examination without abnormal findings: Secondary | ICD-10-CM | POA: Diagnosis not present

## 2020-07-16 NOTE — Progress Notes (Addendum)
Subjective:   [name] is a 86 y.o. female who presents for Medicare Annual (Subsequent) preventive examination.  Review of Systems    No ROS. Medicare Wellness Visit. Additional risk factors are reflected in social history. Cardiac Risk Factors include: advanced age (>21men, >38 women);diabetes mellitus;family history of premature cardiovascular disease;hypertension     Objective:    Today's Vitals   07/16/20 1400  BP: 138/80  Pulse: 98  Temp: 97.9 F (36.6 C)  SpO2: 98%  Weight: 116 lb 9.6 oz (52.9 kg)  Height: 5\' 3"  (1.6 m)  PainSc: 0-No pain   Body mass index is 20.65 kg/m.  Advanced Directives 07/16/2020 06/17/2019 09/27/2018 03/29/2018 01/16/2018  Does Patient Have a Medical Advance Directive? Yes No Yes Yes No  Type of 03/19/2018 of [place];Living will - Healthcare Power of [place];Living will Healthcare Power of [place];Living will -  Does patient want to make changes to medical advance directive? No - Patient declined - - - -  Copy of Healthcare Power of Attorney in Chart? No - copy requested - No - copy requested - -  Would patient like information on creating a medical advance directive? - No - Patient declined - - Yes (Inpatient - patient defers creating a medical advance directive at this time)    Current Medications (verified) Outpatient Encounter Medications as of 07/16/2020  Medication Sig   acetaminophen (TYLENOL) 500 MG tablet Take 500 mg by mouth daily as needed for moderate pain.    apixaban (ELIQUIS) 2.5 MG TABS tablet Take 1 tablet (2.5 mg total) by mouth 2 (two) times daily.   calcium carbonate (OS-CAL) 600 MG TABS tablet Take 600 mg by mouth 2 (two) times daily.    Cholecalciferol (VITAMIN D3 SUPER STRENGTH) 50 MCG (2000 UT) TABS Take 2,000 Units by mouth daily with lunch.   HYDROcodone-acetaminophen (NORCO/VICODIN) 5-325 MG tablet Take 1 tablet by mouth every 12 (twelve) hours as needed for moderate pain. For chronic  back pain   Magnesium 400 MG TABS Take 400 mg by mouth daily with lunch.   multivitamin-lutein (OCUVITE-LUTEIN) CAPS capsule Take 1 capsule by mouth daily with lunch.    Omega-3 Fatty Acids (FISH OIL) 1000 MG CAPS Take 1,000 mg by mouth daily with lunch.    sertraline (ZOLOFT) 25 MG tablet Take 1 tablet (25 mg total) by mouth daily.   tretinoin (RETIN-A) 0.05 % cream Apply 1 application topically daily.    ciclopirox (LOPROX) 0.77 % cream Apply topically 2 (two) times daily. (Patient not taking: Reported on 07/16/2020)   metoprolol succinate (TOPROL-XL) 50 MG 24 hr tablet TAKE 1 TABLET (50 MG TOTAL) BY MOUTH 2 (TWO) TIMES DAILY. TAKE WITH OR IMMEDIATELY FOLLOWING A MEAL.   No facility-administered encounter medications on file as of 07/16/2020.    Allergies (verified) Patient has no known allergies.   History: Past Medical History:  Diagnosis Date   Anemia    Arthritis    "back" (01/16/2018)   Atrial fibrillation (HCC)    Cancer (HCC)    low grade papillary, most recent recurrence 2011,  Dr 2012   Cardiac arrhythmia due to congenital heart disease    History of blood transfusion 01/15/2018   S/P OR   Hypertension    Hyponatremia 01/26/12   Na+ 131   Macular degeneration, bilateral    "were wet; dry now" (01/16/2018)   Other abnormal glucose 06/14/12   Fasting blood glucose 128; A1c 5.8% on 12/15/11   Past Surgical History:  Procedure Laterality Date   ABDOMINAL HYSTERECTOMY  1970s   with oopherectomy for incidenctal cyst (no PMH of abnormal PAP)   APPENDECTOMY     1970s   BLADDER TUMOR EXCISION  2011 & 2013   Dr Karsten Ro   CATARACT EXTRACTION, BILATERAL  2014   Dr Herbert Deaner   COLONOSCOPY     negative; Dr Olevia Perches   CYSTOSCOPY      multiple since tumor excision;Dr Ottelin   DILATION AND CURETTAGE OF UTERUS     FRACTURE SURGERY     INTRAMEDULLARY (IM) NAIL INTERTROCHANTERIC Right 01/15/2018   Procedure: INTRAMEDULLARY (IM) NAIL INTERTROCHANTRIC;  Surgeon: Melrose Nakayama, MD;   Location: [facility];  Service: Orthopedics;  Laterality: Right;   KYPHOPLASTY N/A 03/08/2018   Procedure: THORACIC 12 KYPHOPLASTY;  Surgeon: Phylliss Bob, MD;  Location: [facility];  Service: Orthopedics;  Laterality: N/A;   TONSILLECTOMY     Family History  Problem Relation Age of Onset   Stroke Mother 82   Heart attack Father 8   Coronary artery disease Brother        S/P CBAG ? @ 9   Diabetes Brother    Hypertension Brother    Kidney disease Neg Hx    Cancer Neg Hx    Colon cancer Neg Hx    Colon polyps Neg Hx    Social History   Socioeconomic History   Marital status: Widowed    Spouse name: Not on file   Number of children: 2   Years of education: Not on file   Highest education level: Not on file  Occupational History   Occupation: Retired  Tobacco Use   Smoking status: Never Smoker   Smokeless tobacco: Never Used  Scientific laboratory technician Use: Never used  Substance and Sexual Activity   Alcohol use: Never    Alcohol/week: 0.0 standard drinks   Drug use: Never   Sexual activity: Never  Other Topics Concern   Not on file  Social History Narrative   Not on file   Social Determinants of Health   Financial Resource Strain: Low Risk    Difficulty of Paying Living Expenses: Not hard at all  Food Insecurity: No Food Insecurity   Worried About Charity fundraiser in the Last Year: Never true   [place] in the Last Year: Never true  Transportation Needs: No Transportation Needs   Lack of Transportation (Medical): No   Lack of Transportation (Non-Medical): No  Physical Activity: Sufficiently Active   Days of Exercise per Week: 5 days   Minutes of Exercise per Session: 30 min  Stress: No Stress Concern Present   Feeling of Stress : Not at all  Social Connections: Moderately Integrated   Frequency of Communication with Friends and Family: More than three times a week   Frequency of Social Gatherings with Friends and Family: Once a week   Attends Religious Services:  1 to 4 times per year   Active Member of [employer] or Organizations: No   Attends Archivist Meetings: 1 to 4 times per year   Marital Status: Widowed    Tobacco Counseling Counseling given: Not Answered   Clinical Intake:  Pre-visit preparation completed: Yes  Pain : No/denies pain Pain Score: 0-No pain     BMI - recorded: 20.65 Nutritional Status: BMI of 19-24  Normal Nutritional Risks: None Diabetes: Yes CBG done?: No  How often do you need to have someone help you when you read instructions,  pamphlets, or other written materials from your doctor or pharmacy?: 1 - Never What is the last grade level you completed in school?: High School Graduate  Diabetic? yes  Interpreter Needed?: No  Information entered by :: Lisette Abu, LPN   Activities of Daily Living In your present state of health, do you have any difficulty performing the following activities: 07/16/2020  Hearing? N  Vision? N  Difficulty concentrating or making decisions? Y  Walking or climbing stairs? N  Dressing or bathing? N  Doing errands, shopping? Y  Preparing Food and eating ? N  Using the Toilet? N  In the past six months, have you accidently leaked urine? N  Do you have problems with loss of bowel control? N  Managing your Medications? N  Managing your Finances? N  Housekeeping or managing your Housekeeping? N  Some recent data might be hidden    Patient Care Team: Binnie Rail, MD as PCP - General (Internal Medicine) O'Neal, Cassie Freer, MD as PCP - Cardiology (Cardiology) Zadie Rhine Clent Demark, MD as Consulting Physician (Ophthalmology) Monna Fam, MD as Consulting Physician (Ophthalmology)  Indicate any recent Medical Services you may have received from other than [facility] providers in the past year (date may be approximate).     Assessment:   This is a routine wellness examination for [name].  Hearing/Vision screen No exam data present  Dietary issues and exercise  activities discussed: Current Exercise Habits: Home exercise routine, Type of exercise: walking;Other - see comments (yard work, gardening, trimming limbs), Time (Minutes): 30, Frequency (Times/Week): 5, Weekly Exercise (Minutes/Week): 150, Intensity: Mild, Exercise limited by: cardiac condition(s);orthopedic condition(s);psychological condition(s)  Goals      Patient Stated     Stay as independent as possible       Depression Screen PHQ 2/9 Scores 07/16/2020 05/06/2019 12/21/2018 11/05/2018 09/27/2018 04/11/2017 12/16/2015  PHQ - 2 Score 0 0 0 0 1 0 0    Fall Risk Fall Risk  07/16/2020 05/06/2019 12/21/2018 11/05/2018 09/27/2018  Falls in the past year? 0 1 1 0 1  Number falls in past yr: 0 0 0 - 0  Injury with Fall? 0 1 1 - 1  Risk for fall due to : No Fall Risks - - - -  Follow up Falls evaluation completed - - - Falls prevention discussed    FALL RISK PREVENTION PERTAINING TO THE HOME:  Any stairs in or around the home? No  If so, are there any without handrails? No  Home free of loose throw rugs in walkways, pet beds, electrical cords, etc? Yes  Adequate lighting in your home to reduce risk of falls? Yes   ASSISTIVE DEVICES UTILIZED TO PREVENT FALLS:  Life alert? No  Use of a cane, walker or w/c? No  Grab bars in the bathroom? No  Shower chair or bench in shower? No  Elevated toilet seat or a handicapped toilet? No   TIMED UP AND GO:  Was the test performed? No .  Length of time to ambulate 10 feet: 0 sec.   Gait steady and fast without use of assistive device  Cognitive Function: MMSE - Mini Mental State Exam 09/27/2018  Orientation to time 5  Orientation to Place 5  Registration 3  Attention/ Calculation 5  Recall 1  Language- name 2 objects 2  Language- repeat 1  Language- follow 3 step command 3  Language- read & follow direction 1  Write a sentence 1  Copy design 0  Total score 27  Immunizations Immunization History  Administered Date(s)  Administered   Fluad Quad(high Dose 65+) 05/06/2019, 04/08/2020   Influenza Whole 05/20/2008   Influenza, High Dose Seasonal PF 04/27/2016, 03/27/2017   Influenza,inj,Quad PF,6+ Mos 04/18/2013, 03/27/2014, 04/28/2015   Influenza-Unspecified 04/16/2018   PFIZER SARS-COV-2 Vaccination 09/15/2019, 10/15/2019, 04/24/2020   Pneumococcal Conjugate-13 12/16/2015   Pneumococcal Polysaccharide-23 03/12/2005   Td 07/19/2003   Tdap 01/24/2017   Zoster 07/17/2011   Zoster Recombinat (Shingrix) 05/08/2017    TDAP status: Up to date  Flu Vaccine status: Up to date  Pneumococcal vaccine status: Up to date  Covid-19 vaccine status: Completed vaccines  Qualifies for Shingles Vaccine? Yes   Zostavax completed Yes   Shingrix Completed?: No.    Education has been provided regarding the importance of this vaccine. Patient has been advised to call insurance company to determine out of pocket expense if they have not yet received this vaccine. Advised may also receive vaccine at local pharmacy or Health Dept. Verbalized acceptance and understanding.  Screening Tests Health Maintenance  Topic Date Due   FOOT EXAM  Never done   OPHTHALMOLOGY EXAM  Never done   URINE MICROALBUMIN  07/05/2007   HEMOGLOBIN A1C  07/02/2020   DEXA SCAN  01/28/2022   TETANUS/TDAP  01/25/2027   INFLUENZA VACCINE  Completed   COVID-19 Vaccine  Completed   PNA vac Low Risk Adult  Completed    Health Maintenance  Health Maintenance Due  Topic Date Due   FOOT EXAM  Never done   OPHTHALMOLOGY EXAM  Never done   URINE MICROALBUMIN  07/05/2007   HEMOGLOBIN A1C  07/02/2020    Colorectal cancer screening: No longer required.   Mammogram status: No longer required due to age.  Bone Density status: Completed 01/29/2020. Results reflect: Bone density results: OSTEOPOROSIS. Repeat every 2 years.  Lung Cancer Screening: (Low Dose CT Chest recommended if Age 63-80 years, 30 pack-year currently smoking OR have quit w/in  15years.) does not qualify.   Lung Cancer Screening Referral: no  Additional Screening:  Hepatitis C Screening: does not qualify; Completed no  Vision Screening: Recommended annual ophthalmology exams for early detection of glaucoma and other disorders of the eye. Is the patient up to date with their annual eye exam?  Yes  Who is the provider or what is the name of the office in which the patient attends annual eye exams? Deloria Lair, MD and Monna Fam, OD. If pt is not established with a provider, would they like to be referred to a provider to establish care? No .   Dental Screening: Recommended annual dental exams for proper oral hygiene  Community Resource Referral / Chronic Care Management: CRR required this visit?  No   CCM required this visit?  No      Plan:     I have personally reviewed and noted the following in the patient's chart:   Medical and social history Use of alcohol, tobacco or illicit drugs  Current medications and supplements Functional ability and status Nutritional status Physical activity Advanced directives List of other physicians Hospitalizations, surgeries, and ER visits in previous 12 months Vitals Screenings to include cognitive, depression, and falls Referrals and appointments  In addition, I have reviewed and discussed with patient certain preventive protocols, quality metrics, and best practice recommendations. A written personalized care plan for preventive services as well as general preventive health recommendations were provided to patient.     Sheral Flow, LPN   [id number]   Nurse Notes:  n/a  Medical screening examination/treatment/procedure(s) were performed by non-physician practitioner and as supervising physician I was immediately available for consultation/collaboration.  I agree with above. Lew Dawes, MD

## 2020-07-16 NOTE — Patient Instructions (Addendum)
[name] , Thank you for taking time to come for your Medicare Wellness Visit. I appreciate your ongoing commitment to your health goals. Please review the following plan we discussed and let me know if I can assist you in the future.   Screening recommendations/referrals: Colonoscopy: no repeat due to age Mammogram: 02/09/2017 Bone Density: 01/29/2020 Recommended yearly ophthalmology/optometry visit for glaucoma screening and checkup Recommended yearly dental visit for hygiene and checkup  Vaccinations: Influenza vaccine: 04/08/2020 Pneumococcal vaccine: up to date Tdap vaccine: 01/24/2017; due every 10 years Shingles vaccine: 05/08/2017; need second vaccination date  Covid-19: up to date  Advanced directives: Please bring a copy of your health care power of attorney and living will to the office at your convenience.  Conditions/risks identified: Yes; Reviewed health maintenance screenings with patient today and relevant education, vaccines, and/or referrals were provided. Please continue to do your personal lifestyle choices by: daily care of teeth and gums, regular physical activity (goal should be 5 days a week for 30 minutes), eat a healthy diet, avoid tobacco and drug use, limiting any alcohol intake, taking a low-dose aspirin (if not allergic or have been advised by your provider otherwise) and taking vitamins and minerals as recommended by your provider. Continue doing brain stimulating activities (puzzles, reading, adult coloring books, staying active) to keep memory sharp. Continue to eat heart healthy diet (full of fruits, vegetables, whole grains, lean protein, water--limit salt, fat, and sugar intake) and increase physical activity as tolerated.  Next appointment: Please schedule your next Medicare Wellness Visit with your Nurse Health Advisor in 1 year by calling [phone].   Preventive Care 3 Years and Older, Female Preventive care refers to lifestyle choices and visits  with your health care provider that can promote health and wellness. What does preventive care include?  A yearly physical exam. This is also called an annual well check.  Dental exams once or twice a year.  Routine eye exams. Ask your health care provider how often you should have your eyes checked.  Personal lifestyle choices, including:  Daily care of your teeth and gums.  Regular physical activity.  Eating a healthy diet.  Avoiding tobacco and drug use.  Limiting alcohol use.  Practicing safe sex.  Taking low-dose aspirin every day.  Taking vitamin and mineral supplements as recommended by your health care provider. What happens during an annual well check? The services and screenings done by your health care provider during your annual well check will depend on your age, overall health, lifestyle risk factors, and family history of disease. Counseling  Your health care provider may ask you questions about your:  Alcohol use.  Tobacco use.  Drug use.  Emotional well-being.  Home and relationship well-being.  Sexual activity.  Eating habits.  History of falls.  Memory and ability to understand (cognition).  Work and work Astronomer.  Reproductive health. Screening  You may have the following tests or measurements:  Height, weight, and BMI.  Blood pressure.  Lipid and cholesterol levels. These may be checked every 5 years, or more frequently if you are over 101 years old.  Skin check.  Lung cancer screening. You may have this screening every year starting at age 34 if you have a 30-pack-year history of smoking and currently smoke or have quit within the past 15 years.  Fecal occult blood test (FOBT) of the stool. You may have this test every year starting at age 11.  Flexible sigmoidoscopy or colonoscopy. You may have a sigmoidoscopy  every 5 years or a colonoscopy every 10 years starting at age 66.  Hepatitis C blood test.  Hepatitis B blood  test.  Sexually transmitted disease (STD) testing.  Diabetes screening. This is done by checking your blood sugar (glucose) after you have not eaten for a while (fasting). You may have this done every 1-3 years.  Bone density scan. This is done to screen for osteoporosis. You may have this done starting at age 5.  Mammogram. This may be done every 1-2 years. Talk to your health care provider about how often you should have regular mammograms. Talk with your health care provider about your test results, treatment options, and if necessary, the need for more tests. Vaccines  Your health care provider may recommend certain vaccines, such as:  Influenza vaccine. This is recommended every year.  Tetanus, diphtheria, and acellular pertussis (Tdap, Td) vaccine. You may need a Td booster every 10 years.  Zoster vaccine. You may need this after age 32.  Pneumococcal 13-valent conjugate (PCV13) vaccine. One dose is recommended after age 58.  Pneumococcal polysaccharide (PPSV23) vaccine. One dose is recommended after age 8. Talk to your health care provider about which screenings and vaccines you need and how often you need them. This information is not intended to replace advice given to you by your health care provider. Make sure you discuss any questions you have with your health care provider. Document Released: 07/31/2015 Document Revised: 03/23/2016 Document Reviewed: 05/05/2015 Elsevier Interactive Patient Education  2017 [employer] Prevention in the Home Falls can cause injuries. They can happen to people of all ages. There are many things you can do to make your home safe and to help prevent falls. What can I do on the outside of my home?  Regularly fix the edges of walkways and driveways and fix any cracks.  Remove anything that might make you trip as you walk through a door, such as a raised step or threshold.  Trim any bushes or trees on the path to your home.  Use  bright outdoor lighting.  Clear any walking paths of anything that might make someone trip, such as rocks or tools.  Regularly check to see if handrails are loose or broken. Make sure that both sides of any steps have handrails.  Any raised decks and porches should have guardrails on the edges.  Have any leaves, snow, or ice cleared regularly.  Use sand or salt on walking paths during winter.  Clean up any spills in your garage right away. This includes oil or grease spills. What can I do in the bathroom?  Use night lights.  Install grab bars by the toilet and in the tub and shower. Do not use towel bars as grab bars.  Use non-skid mats or decals in the tub or shower.  If you need to sit down in the shower, use a plastic, non-slip stool.  Keep the floor dry. Clean up any water that spills on the floor as soon as it happens.  Remove soap buildup in the tub or shower regularly.  Attach bath mats securely with double-sided non-slip rug tape.  Do not have throw rugs and other things on the floor that can make you trip. What can I do in the bedroom?  Use night lights.  Make sure that you have a light by your bed that is easy to reach.  Do not use any sheets or blankets that are too big for your bed. They should  not hang down onto the floor.  Have a firm chair that has side arms. You can use this for support while you get dressed.  Do not have throw rugs and other things on the floor that can make you trip. What can I do in the kitchen?  Clean up any spills right away.  Avoid walking on wet floors.  Keep items that you use a lot in easy-to-reach places.  If you need to reach something above you, use a strong step stool that has a grab bar.  Keep electrical cords out of the way.  Do not use floor polish or wax that makes floors slippery. If you must use wax, use non-skid floor wax.  Do not have throw rugs and other things on the floor that can make you trip. What can  I do with my stairs?  Do not leave any items on the stairs.  Make sure that there are handrails on both sides of the stairs and use them. Fix handrails that are broken or loose. Make sure that handrails are as long as the stairways.  Check any carpeting to make sure that it is firmly attached to the stairs. Fix any carpet that is loose or worn.  Avoid having throw rugs at the top or bottom of the stairs. If you do have throw rugs, attach them to the floor with carpet tape.  Make sure that you have a light switch at the top of the stairs and the bottom of the stairs. If you do not have them, ask someone to add them for you. What else can I do to help prevent falls?  Wear shoes that:  Do not have high heels.  Have rubber bottoms.  Are comfortable and fit you well.  Are closed at the toe. Do not wear sandals.  If you use a stepladder:  Make sure that it is fully opened. Do not climb a closed stepladder.  Make sure that both sides of the stepladder are locked into place.  Ask someone to hold it for you, if possible.  Clearly mark and make sure that you can see:  Any grab bars or handrails.  First and last steps.  Where the edge of each step is.  Use tools that help you move around (mobility aids) if they are needed. These include:  Canes.  Walkers.  Scooters.  Crutches.  Turn on the lights when you go into a dark area. Replace any light bulbs as soon as they burn out.  Set up your furniture so you have a clear path. Avoid moving your furniture around.  If any of your floors are uneven, fix them.  If there are any pets around you, be aware of where they are.  Review your medicines with your doctor. Some medicines can make you feel dizzy. This can increase your chance of falling. Ask your doctor what other things that you can do to help prevent falls. This information is not intended to replace advice given to you by your health care provider. Make sure you  discuss any questions you have with your health care provider. Document Released: 04/30/2009 Document Revised: 12/10/2015 Document Reviewed: 08/08/2014 Elsevier Interactive Patient Education  2017 [employer].

## 2020-07-24 ENCOUNTER — Telehealth: Payer: Self-pay | Admitting: Internal Medicine

## 2020-07-24 DIAGNOSIS — S22080D Wedge compression fracture of T11-T12 vertebra, subsequent encounter for fracture with routine healing: Secondary | ICD-10-CM

## 2020-07-24 MED ORDER — HYDROCODONE-ACETAMINOPHEN 5-325 MG PO TABS: 1.0000 | ORAL_TABLET | Freq: Two times a day (BID) | ORAL | 0 refills | Status: DC | PRN

## 2020-07-24 NOTE — Telephone Encounter (Signed)
Mailed out today

## 2020-07-24 NOTE — Telephone Encounter (Signed)
   Refill request HYDROcodone-acetaminophen (NORCO/VICODIN) 5-325 MG tablet Pharmacy [id number] [address], [place] Next appt  10/12/20   Patient's son requesting a letter if possible stating the patient is unable to use Life Alert device that she is wearing. He states patient doesn't understand how to use and the only way he can get a refund is with a letter from provider.

## 2020-07-24 NOTE — Telephone Encounter (Signed)
Prescription sent.  Letter written.

## 2020-08-20 ENCOUNTER — Telehealth: Payer: Self-pay | Admitting: Internal Medicine

## 2020-08-20 DIAGNOSIS — S22080D Wedge compression fracture of T11-T12 vertebra, subsequent encounter for fracture with routine healing: Secondary | ICD-10-CM

## 2020-08-20 MED ORDER — APIXABAN 2.5 MG PO TABS: 2.5000 mg | ORAL_TABLET | Freq: Two times a day (BID) | ORAL | 5 refills | Status: AC

## 2020-08-20 NOTE — Telephone Encounter (Signed)
Sent in today for patient. 

## 2020-08-20 NOTE — Telephone Encounter (Signed)
   1.Medication Requested:HYDROcodone-acetaminophen (NORCO/VICODIN) 5-325 MG tablet  2. Pharmacy (Name, Street, City):[id number] [place], [place] - [address]  3. On Med List: yes  4. Last Visit with PCP: 04/14/20  5. Next visit date with PCP: 10/12/20   Agent: Please be advised that RX refills may take up to 3 business days. We ask that you follow-up with your pharmacy.  

## 2020-08-21 ENCOUNTER — Other Ambulatory Visit: Payer: Self-pay | Admitting: Internal Medicine

## 2020-08-24 MED ORDER — HYDROCODONE-ACETAMINOPHEN 5-325 MG PO TABS: 1.0000 | ORAL_TABLET | Freq: Two times a day (BID) | ORAL | 0 refills | Status: DC | PRN

## 2020-08-24 NOTE — Telephone Encounter (Signed)
1.Medication Requested: HYDROcodone-acetaminophen (NORCO/VICODIN) 5-325 MG tablet    2. Pharmacy (Name, Street, [place]): [id number] [place], [place]  3. On Med List: yes   4. Last Visit with PCP: 9.28.21  5. Next visit date with PCP: 3.28.22   Agent: Please be advised that RX refills may take up to 3 business days. We ask that you follow-up with your pharmacy.

## 2020-08-24 NOTE — Addendum Note (Signed)
Addended by: Binnie Rail on: 08/24/2020 05:11 PM   Modules accepted: Orders

## 2020-09-18 ENCOUNTER — Telehealth: Payer: Self-pay | Admitting: Internal Medicine

## 2020-09-18 DIAGNOSIS — S22080D Wedge compression fracture of T11-T12 vertebra, subsequent encounter for fracture with routine healing: Secondary | ICD-10-CM

## 2020-09-18 MED ORDER — HYDROCODONE-ACETAMINOPHEN 5-325 MG PO TABS: 1.0000 | ORAL_TABLET | Freq: Two times a day (BID) | ORAL | 0 refills | Status: DC | PRN

## 2020-09-18 NOTE — Telephone Encounter (Signed)
HYDROcodone-acetaminophen (NORCO/VICODIN) 5-325 MG tablet [id number] [place], [place] Phone:  [phone]  Fax:  [phone]     Last seen-09.28.21 Next apt- 03.28.22

## 2020-10-11 DIAGNOSIS — G8929 Other chronic pain: Secondary | ICD-10-CM | POA: Insufficient documentation

## 2020-10-11 DIAGNOSIS — M549 Dorsalgia, unspecified: Secondary | ICD-10-CM | POA: Insufficient documentation

## 2020-10-11 NOTE — Progress Notes (Signed)
Subjective:    Patient ID: [name], female    DOB: 07/20/1929, 87 y.o.   MRN: [id number]  HPI The patient is here for follow up of their chronic medical problems, including htn, DM, cognitive impairment, Afib, OP, chronic back pain from T12 compression fx  Her son oversees her medication.    She has some paranoia and delusions per her son.  He wonders if we can increase the sertraline.    She is sleeping well and eating well.  She has no complaints.   Medications and allergies reviewed with patient and updated if appropriate.  Patient Active Problem List   Diagnosis Date Noted  . Chronic back pain 10/11/2020  . Anxiety and depression 04/14/2020  . Pseudophakia 03/05/2020  . Advanced nonexudative age-related macular degeneration of both eyes with subfoveal involvement 03/05/2020  . Jaw pain 02/14/2020  . B12 deficiency 01/01/2020  . Tinea corporis 09/09/2019  . Hypomagnesemia 07/01/2019  . Hypophosphatemia 07/01/2019  . DNR (do not resuscitate) 06/19/2019  . Syncope 06/17/2019  . Memory difficulties 12/21/2018  . Acute cystitis without hematuria 12/21/2018  . T12 compression fracture ([place]) 04/24/2018  . Femur fracture, right ([place]) 01/14/2018  . Macular degeneration (senile) of retina 06/14/2010  . Vitamin D deficiency 03/10/2009  . Osteoporosis 03/10/2009  . NEOPLASM, MALIGNANT, BLADDER, HX OF 03/10/2009  . 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  . Diabetes mellitus without complication ([place]) 54/65/0354  . Essential hypertension 08/09/2007  . Paroxysmal atrial fibrillation ([place]) 08/09/2007    Current Outpatient Medications on File Prior to Visit  Medication Sig Dispense Refill  . acetaminophen (TYLENOL) 500 MG tablet Take 500 mg by mouth daily as needed for moderate pain.     Marland Kitchen apixaban (ELIQUIS) 2.5 MG TABS tablet Take 1 tablet (2.5 mg total) by mouth 2 (two) times daily. 60 tablet 5  . calcium carbonate (OS-CAL) 600 MG TABS tablet Take 600 mg by mouth 2 (two) times  daily.     . Cholecalciferol (VITAMIN D3 SUPER STRENGTH) 50 MCG (2000 UT) TABS Take 2,000 Units by mouth daily with lunch.    Marland Kitchen HYDROcodone-acetaminophen (NORCO/VICODIN) 5-325 MG tablet Take 1 tablet by mouth every 12 (twelve) hours as needed for moderate pain. For chronic back pain 60 tablet 0  . Magnesium 400 MG TABS Take 400 mg by mouth daily with lunch.    . multivitamin-lutein (OCUVITE-LUTEIN) CAPS capsule Take 1 capsule by mouth daily with lunch.     . Omega-3 Fatty Acids (FISH OIL) 1000 MG CAPS Take 1,000 mg by mouth daily with lunch.     . tretinoin (RETIN-A) 0.05 % cream Apply 1 application topically daily.     . metoprolol succinate (TOPROL-XL) 50 MG 24 hr tablet TAKE 1 TABLET (50 MG TOTAL) BY MOUTH 2 (TWO) TIMES DAILY. TAKE WITH OR IMMEDIATELY FOLLOWING A MEAL. 180 tablet 2   No current facility-administered medications on file prior to visit.    Past Medical History:  Diagnosis Date  . Anemia   . Arthritis    "back" (01/16/2018)  . Atrial fibrillation ([place])   . Cancer [facility])    low grade papillary, most recent recurrence 2011,  Dr Karsten Ro  . 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 History of blood transfusion 01/15/2018   S/P OR  . Hypertension   . Hyponatremia 01/26/12   Na+ 131  . Macular degeneration, bilateral    "were wet; dry now" (01/16/2018)  . Other abnormal glucose 06/14/12   Fasting blood  glucose 128; A1c 5.8% on 12/15/11    Past Surgical History:  Procedure Laterality Date  . ABDOMINAL HYSTERECTOMY  1970s   with oopherectomy for incidenctal cyst (no PMH of abnormal PAP)  . APPENDECTOMY     1970s  . BLADDER TUMOR EXCISION  2011 & 2013   Dr Karsten Ro  . CATARACT EXTRACTION, BILATERAL  2014   Dr Herbert Deaner  . COLONOSCOPY     negative; Dr Olevia Perches  . CYSTOSCOPY      multiple since tumor excision;Dr Ottelin  . DILATION AND CURETTAGE OF UTERUS    . FRACTURE SURGERY    . INTRAMEDULLARY (IM) NAIL INTERTROCHANTERIC Right 01/15/2018   Procedure:  INTRAMEDULLARY (IM) NAIL INTERTROCHANTRIC;  Surgeon: Melrose Nakayama, MD;  Location: [facility];  Service: Orthopedics;  Laterality: Right;  . KYPHOPLASTY N/A 03/08/2018   Procedure: THORACIC 12 KYPHOPLASTY;  Surgeon: Phylliss Bob, MD;  Location: [facility];  Service: Orthopedics;  Laterality: N/A;  . TONSILLECTOMY      Social History   Socioeconomic History  . Marital status: Widowed    Spouse name: Not on file  . Number of children: 2  . Years of education: Not on file  . Highest education level: Not on file  Occupational History  . Occupation: Retired  Tobacco Use  . Smoking status: Never Smoker  . Smokeless tobacco: Never Used  Vaping Use  . Vaping Use: Never used  Substance and Sexual Activity  . Alcohol use: Never    Alcohol/week: 0.0 standard drinks  . Drug use: Never  . Sexual activity: Never  Other Topics Concern  . Not on file  Social History Narrative  . Not on file   Social Determinants of Health   Financial Resource Strain: Low Risk   . Difficulty of Paying Living Expenses: Not hard at all  Food Insecurity: No Food Insecurity  . Worried About Charity fundraiser in the Last Year: Never true  . Ran Out of Food in the Last Year: Never true  Transportation Needs: No Transportation Needs  . Lack of Transportation (Medical): No  . Lack of Transportation (Non-Medical): No  Physical Activity: Sufficiently Active  . Days of Exercise per Week: 5 days  . Minutes of Exercise per Session: 30 min  Stress: No Stress Concern Present  . Feeling of Stress : Not at all  Social Connections: Moderately Integrated  . Frequency of Communication with Friends and Family: More than three times a week  . Frequency of Social Gatherings with Friends and Family: Once a week  . Attends Religious Services: 1 to 4 times per year  . Active Member of Clubs or Organizations: No  . Attends Archivist Meetings: 1 to 4 times per year  . Marital Status: Widowed    Family History   Problem Relation Age of Onset  . Stroke Mother 62  . Heart attack Father 40  . Coronary artery disease Brother        S/P CBAG ? @ 60  . Diabetes Brother   . Hypertension Brother   . Kidney disease Neg Hx   . Cancer Neg Hx   . Colon cancer Neg Hx   . Colon polyps Neg Hx     Review of Systems  Constitutional: Negative for appetite change and fever.  Respiratory: Negative for cough, shortness of breath and wheezing.   Cardiovascular: Negative for chest pain, palpitations and leg swelling.  Neurological: Negative for light-headedness and headaches.  Psychiatric/Behavioral: Negative for sleep disturbance.  Objective:   Vitals:   10/12/20 1423  BP: 132/82  Pulse: 97  Temp: 97.7 F (36.5 C)  SpO2: 97%   BP Readings from Last 3 Encounters:  10/12/20 132/82  07/16/20 138/80  04/24/20 133/80   Wt Readings from Last 3 Encounters:  10/12/20 117 lb (53.1 kg)  07/16/20 116 lb 9.6 oz (52.9 kg)  04/24/20 115 lb 12.8 oz (52.5 kg)   Body mass index is 20.73 kg/m.   Physical Exam    Constitutional: Appears well-developed, thin elderly female in no distress.  HENT:  Head: Normocephalic and atraumatic.  Neck: Neck supple. No tracheal deviation present. No thyromegaly present.  No cervical lymphadenopathy Cardiovascular: Normal rate, irregular rhythm and normal heart sounds.   No murmur heard. No carotid bruit .  No edema Pulmonary/Chest: Effort normal and breath sounds normal. No respiratory distress. No has no wheezes. No rales.  Skin: Skin is warm and dry. Not diaphoretic.  Psychiatric: Normal mood and affect. Behavior is normal.      Assessment & Plan:    See Problem List for Assessment and Plan of chronic medical problems.    This visit occurred during the SARS-CoV-2 public health emergency.  Safety protocols were in place, including screening questions prior to the visit, additional usage of staff PPE, and extensive cleaning of exam room while observing  appropriate contact time as indicated for disinfecting solutions.

## 2020-10-11 NOTE — Patient Instructions (Addendum)
  Blood work was ordered.     Medications changes include :   Increase sertraline to 50 mg   Your prescription(s) have been submitted to your pharmacy. Please take as directed and contact our office if you believe you are having problem(s) with the medication(s).     Please followup in 6 months

## 2020-10-12 ENCOUNTER — Encounter: Payer: Self-pay | Admitting: Internal Medicine

## 2020-10-12 ENCOUNTER — Other Ambulatory Visit: Payer: Self-pay

## 2020-10-12 ENCOUNTER — Ambulatory Visit: Payer: Medicare Other | Admitting: Internal Medicine

## 2020-10-12 VITALS — BP 132/82 | HR 97 | Temp 97.7°F | Ht 63.0 in | Wt 117.0 lb

## 2020-10-12 DIAGNOSIS — M810 Age-related osteoporosis without current pathological fracture: Secondary | ICD-10-CM | POA: Diagnosis not present

## 2020-10-12 DIAGNOSIS — F32A Depression, unspecified: Secondary | ICD-10-CM

## 2020-10-12 DIAGNOSIS — I480 Paroxysmal atrial fibrillation: Secondary | ICD-10-CM

## 2020-10-12 DIAGNOSIS — I10 Essential (primary) hypertension: Secondary | ICD-10-CM

## 2020-10-12 DIAGNOSIS — E559 Vitamin D deficiency, unspecified: Secondary | ICD-10-CM

## 2020-10-12 DIAGNOSIS — E119 Type 2 diabetes mellitus without complications: Secondary | ICD-10-CM

## 2020-10-12 DIAGNOSIS — F419 Anxiety disorder, unspecified: Secondary | ICD-10-CM

## 2020-10-12 DIAGNOSIS — M546 Pain in thoracic spine: Secondary | ICD-10-CM

## 2020-10-12 DIAGNOSIS — E538 Deficiency of other specified B group vitamins: Secondary | ICD-10-CM

## 2020-10-12 DIAGNOSIS — G8929 Other chronic pain: Secondary | ICD-10-CM

## 2020-10-12 DIAGNOSIS — R413 Other amnesia: Secondary | ICD-10-CM

## 2020-10-12 LAB — COMPREHENSIVE METABOLIC PANEL
ALT: 15 U/L (ref 0–35)
AST: 14 U/L (ref 0–37)
Albumin: 4.1 g/dL (ref 3.5–5.2)
Alkaline Phosphatase: 76 U/L (ref 39–117)
BUN: 21 mg/dL (ref 6–23)
CO2: 29 mEq/L (ref 19–32)
Calcium: 9.6 mg/dL (ref 8.4–10.5)
Chloride: 101 mEq/L (ref 96–112)
Creatinine, Ser: 0.84 mg/dL (ref 0.40–1.20)
GFR: 61.9 mL/min (ref >60.00)
Glucose, Bld: 118 mg/dL — ABNORMAL HIGH (ref 70–99)
Potassium: 4.6 mEq/L (ref 3.5–5.1)
Sodium: 136 mEq/L (ref 135–145)
Total Bilirubin: 0.4 mg/dL (ref 0.2–1.2)
Total Protein: 7.5 g/dL (ref 6.0–8.3)

## 2020-10-12 LAB — LIPID PANEL
Cholesterol: 211 mg/dL — ABNORMAL HIGH (ref 0–200)
HDL: 63.8 mg/dL (ref >39.00)
LDL Cholesterol: 131 mg/dL — ABNORMAL HIGH (ref 0–99)
NonHDL: 147.41
Total CHOL/HDL Ratio: 3
Triglycerides: 84 mg/dL (ref 0.0–149.0)
VLDL: 16.8 mg/dL (ref 0.0–40.0)

## 2020-10-12 LAB — CBC WITH DIFFERENTIAL/PLATELET
Basophils Absolute: 0 10*3/uL (ref 0.0–0.1)
Basophils Relative: 0.6 % (ref 0.0–3.0)
Eosinophils Absolute: 0.1 10*3/uL (ref 0.0–0.7)
Eosinophils Relative: 1.4 % (ref 0.0–5.0)
HCT: 42.2 % (ref 36.0–46.0)
Hemoglobin: 13.9 g/dL (ref 12.0–15.0)
Lymphocytes Relative: 16.8 % (ref 12.0–46.0)
Lymphs Abs: 1.3 10*3/uL (ref 0.7–4.0)
MCHC: 32.9 g/dL (ref 30.0–36.0)
MCV: 91.5 fl (ref 78.0–100.0)
Monocytes Absolute: 0.9 10*3/uL (ref 0.1–1.0)
Monocytes Relative: 12 % (ref 3.0–12.0)
Neutro Abs: 5.3 10*3/uL (ref 1.4–7.7)
Neutrophils Relative %: 69.2 % (ref 43.0–77.0)
Platelets: 339 10*3/uL (ref 150.0–400.0)
RBC: 4.61 Mil/uL (ref 3.87–5.11)
RDW: 15.1 % (ref 11.5–15.5)
WBC: 7.7 10*3/uL (ref 4.0–10.5)

## 2020-10-12 LAB — VITAMIN B12: Vitamin B-12: >1506 pg/mL — ABNORMAL HIGH (ref 211–911)

## 2020-10-12 LAB — MAGNESIUM: Magnesium: 2.2 mg/dL (ref 1.5–2.5)

## 2020-10-12 LAB — HEMOGLOBIN A1C: Hgb A1c MFr Bld: 6.3 % (ref 4.6–6.5)

## 2020-10-12 LAB — VITAMIN D 25 HYDROXY (VIT D DEFICIENCY, FRACTURES): VITD: 34.64 ng/mL (ref 30.00–100.00)

## 2020-10-12 MED ORDER — SERTRALINE HCL 50 MG PO TABS: 50.0000 mg | ORAL_TABLET | Freq: Every day | ORAL | 3 refills | Status: AC

## 2020-10-12 NOTE — Assessment & Plan Note (Signed)
Chronic Pain related to T12 compression fracture Doing well with hydrocodone 5-325 mg 1 tablet every 12 hours.  Her son gives her her medications so she is taking the medication appropriately and at times if she does not need it he will give her Tylenol only We will continue current dose of hydrocodone

## 2020-10-12 NOTE — Assessment & Plan Note (Signed)
Chronic Check magnesium level Continue magnesium 400 mg daily

## 2020-10-12 NOTE — Assessment & Plan Note (Signed)
Chronic Following with cardiology In A. fib here today On Eliquis 2.5 mg twice daily and metoprolol XL 50 mg twice daily She is asymptomatic CBC, CMP

## 2020-10-12 NOTE — Assessment & Plan Note (Signed)
Chronic Sugars well controlled with diet alone We will check A1c Her son does check her sugars periodically and they are well controlled

## 2020-10-12 NOTE — Assessment & Plan Note (Signed)
Chronic BP well controlled Continue metoprolol XL 50 mg twice daily cmp

## 2020-10-12 NOTE — Assessment & Plan Note (Signed)
Chronic Continue Prolia every 6 months Continue calcium and vitamin D No falls since her last visit

## 2020-10-12 NOTE — Assessment & Plan Note (Signed)
Chronic Taking vitamin D daily Check vitamin D level  

## 2020-10-12 NOTE — Assessment & Plan Note (Signed)
Chronic Depression and anxiety seem to be fairly controlled, but she is experiencing some paranoia and delusions We will try increasing sertraline to 50 mg daily and her son will let us know if we need to increase further or do something different

## 2020-10-12 NOTE — Assessment & Plan Note (Signed)
Chronic Memory slightly worse and she is experiencing some delusions and her son Will increase sertraline to 50 mg daily Her son will let us know if we need to do something further

## 2020-10-12 NOTE — Assessment & Plan Note (Signed)
Chronic Taking B12 daily Check B12 level 

## 2020-10-20 ENCOUNTER — Telehealth: Payer: Self-pay | Admitting: Internal Medicine

## 2020-10-20 DIAGNOSIS — S22080D Wedge compression fracture of T11-T12 vertebra, subsequent encounter for fracture with routine healing: Secondary | ICD-10-CM

## 2020-10-20 MED ORDER — HYDROCODONE-ACETAMINOPHEN 5-325 MG PO TABS: 1.0000 | ORAL_TABLET | Freq: Two times a day (BID) | ORAL | 0 refills | Status: DC | PRN

## 2020-10-20 NOTE — Telephone Encounter (Signed)
1.Medication Requested: HYDROcodone-acetaminophen (NORCO/VICODIN) 5-325 MG tablet    2. Pharmacy (Name, Street, [place]): [id number] [place], [place]  3. On Med List: yes   4. Last Visit with PCP: 10-12-20  5. Next visit date with PCP: 04-12-21   Agent: Please be advised that RX refills may take up to 3 business days. We ask that you follow-up with your pharmacy.

## 2020-11-19 ENCOUNTER — Telehealth: Payer: Self-pay | Admitting: Internal Medicine

## 2020-11-19 DIAGNOSIS — S22080D Wedge compression fracture of T11-T12 vertebra, subsequent encounter for fracture with routine healing: Secondary | ICD-10-CM

## 2020-11-19 MED ORDER — HYDROCODONE-ACETAMINOPHEN 5-325 MG PO TABS: 1.0000 | ORAL_TABLET | Freq: Two times a day (BID) | ORAL | 0 refills | Status: DC | PRN

## 2020-11-19 NOTE — Telephone Encounter (Signed)
1.Medication Requested: HYDROcodone-acetaminophen (NORCO/VICODIN) 5-325 MG tablet    2. Pharmacy (Name, Street, [place]): [id number] [place], [place]  3. On Med List: yes   4. Last Visit with PCP: 10-12-20  5. Next visit date with PCP: 04-12-21

## 2020-11-24 ENCOUNTER — Emergency Department (HOSPITAL_COMMUNITY): Payer: Medicare Other

## 2020-11-24 ENCOUNTER — Emergency Department (HOSPITAL_COMMUNITY)
Admission: EM | Admit: 2020-11-24 | Discharge: 2020-11-24 | Disposition: A | Discharge status: Home / Self Care | Payer: Medicare Other | Attending: Emergency Medicine | Admitting: Emergency Medicine

## 2020-11-24 ENCOUNTER — Other Ambulatory Visit: Payer: Self-pay

## 2020-11-24 ENCOUNTER — Encounter (HOSPITAL_COMMUNITY): Payer: Self-pay

## 2020-11-24 DIAGNOSIS — W19XXXA Unspecified fall, initial encounter: Secondary | ICD-10-CM

## 2020-11-24 DIAGNOSIS — R441 Visual hallucinations: Secondary | ICD-10-CM | POA: Insufficient documentation

## 2020-11-24 DIAGNOSIS — Z7901 Long term (current) use of anticoagulants: Secondary | ICD-10-CM | POA: Insufficient documentation

## 2020-11-24 DIAGNOSIS — F0150 Vascular dementia without behavioral disturbance: Secondary | ICD-10-CM | POA: Insufficient documentation

## 2020-11-24 DIAGNOSIS — I4891 Unspecified atrial fibrillation: Secondary | ICD-10-CM | POA: Diagnosis not present

## 2020-11-24 DIAGNOSIS — W010XXA Fall on same level from slipping, tripping and stumbling without subsequent striking against object, initial encounter: Secondary | ICD-10-CM | POA: Insufficient documentation

## 2020-11-24 DIAGNOSIS — S0511XA Contusion of eyeball and orbital tissues, right eye, initial encounter: Secondary | ICD-10-CM | POA: Diagnosis not present

## 2020-11-24 DIAGNOSIS — R440 Auditory hallucinations: Secondary | ICD-10-CM | POA: Diagnosis not present

## 2020-11-24 DIAGNOSIS — Y92002 Bathroom of unspecified non-institutional (private) residence single-family (private) house as the place of occurrence of the external cause: Secondary | ICD-10-CM | POA: Insufficient documentation

## 2020-11-24 DIAGNOSIS — S0591XA Unspecified injury of right eye and orbit, initial encounter: Secondary | ICD-10-CM | POA: Diagnosis present

## 2020-11-24 HISTORY — DX: Unspecified atrial fibrillation: I48.91

## 2020-11-24 LAB — URINALYSIS, ROUTINE W REFLEX MICROSCOPIC
Bilirubin Urine: NEGATIVE
Glucose, UA: NEGATIVE mg/dL
Hgb urine dipstick: NEGATIVE
Ketones, ur: 20 mg/dL — AB
Leukocytes,Ua: NEGATIVE
Nitrite: NEGATIVE
Protein, ur: NEGATIVE mg/dL
Specific Gravity, Urine: 1.018 (ref 1.005–1.030)
pH: 5 (ref 5.0–8.0)

## 2020-11-24 LAB — CBC WITH DIFFERENTIAL/PLATELET
Abs Immature Granulocytes: 0.03 10*3/uL (ref 0.00–0.07)
Basophils Absolute: 0 10*3/uL (ref 0.0–0.1)
Basophils Relative: 0 %
Eosinophils Absolute: 0.1 10*3/uL (ref 0.0–0.5)
Eosinophils Relative: 1 %
HCT: 45.3 % (ref 36.0–46.0)
Hemoglobin: 14.2 g/dL (ref 12.0–15.0)
Immature Granulocytes: 0 %
Lymphocytes Relative: 17 %
Lymphs Abs: 1.2 10*3/uL (ref 0.7–4.0)
MCH: 30.3 pg (ref 26.0–34.0)
MCHC: 31.3 g/dL (ref 30.0–36.0)
MCV: 96.8 fL (ref 80.0–100.0)
Monocytes Absolute: 0.9 10*3/uL (ref 0.1–1.0)
Monocytes Relative: 12 %
Neutro Abs: 5.3 10*3/uL (ref 1.7–7.7)
Neutrophils Relative %: 70 %
Platelets: 293 10*3/uL (ref 150–400)
RBC: 4.68 MIL/uL (ref 3.87–5.11)
RDW: 14.2 % (ref 11.5–15.5)
WBC: 7.5 10*3/uL (ref 4.0–10.5)
nRBC: 0 % (ref 0.0–0.2)

## 2020-11-24 LAB — BASIC METABOLIC PANEL
Anion gap: 6 (ref 5–15)
BUN: 25 mg/dL — ABNORMAL HIGH (ref 8–23)
CO2: 27 mmol/L (ref 22–32)
Calcium: 9 mg/dL (ref 8.9–10.3)
Chloride: 101 mmol/L (ref 98–111)
Creatinine, Ser: 0.88 mg/dL (ref 0.44–1.00)
GFR, Estimated: >60 mL/min (ref >60)
Glucose, Bld: 159 mg/dL — ABNORMAL HIGH (ref 70–99)
Potassium: 4.1 mmol/L (ref 3.5–5.1)
Sodium: 134 mmol/L — ABNORMAL LOW (ref 135–145)

## 2020-11-24 NOTE — ED Triage Notes (Signed)
Per EMS: Son found PT holding ice pack to eye. Unwitnessed fall around 0200. Right eye bruising. On Eloquis. Last dose last night. No other complaints.

## 2020-11-24 NOTE — ED Provider Notes (Signed)
[facility] EMERGENCY DEPARTMENT Provider Note   CSN: [id number] Arrival date & time: 11/24/20  1451     History Chief Complaint  Patient presents with  . Fall    [name] is a 87 y.o. female.  HPI  Patient with h/o dementia and afib on elliquis presents via EMS for unwitnessed fall. She fell around 2-3 am in the morning. She believes she hit her head, but denies any headache, nausea, vomiting. She is not sure if she lost consciousness. She states she tripped while going to the bathroom. She denies having vision changes, but endorses pain around the right eye. It is swollen and bruised, limiting her ability to see out of it.     Son states she had visual and auditory hallucination after the fall. She has had these in the past but not often. There were no hallucinatory symptoms before the fall.   Past Medical History:  Diagnosis Date  . Atrial fibrillation ([place])     There are no problems to display for this patient.   History reviewed. No pertinent surgical history.   OB History   No obstetric history on file.     History reviewed. No pertinent family history.  Social History   Tobacco Use  . Smoking status: Never Smoker  . Smokeless tobacco: Never Used    Home Medications Prior to Admission medications   Not on File    Allergies    Patient has no allergy information on record.  Review of Systems   Review of Systems  Eyes: Positive for pain. Negative for discharge and visual disturbance.  Respiratory: Negative for chest tightness and shortness of breath.   Cardiovascular: Negative for chest pain.  Musculoskeletal: Negative for arthralgias, back pain and neck pain.  Neurological: Negative for dizziness, speech difficulty, weakness and numbness.  Psychiatric/Behavioral: Positive for hallucinations.    Physical Exam Updated Vital Signs BP 132/82 (BP Location: Right Arm)   Pulse 90   Temp 98 F (36.7 C) (Oral)   Resp 12   SpO2  98%   Physical Exam Vitals and nursing note reviewed. Exam conducted with a chaperone present.  Constitutional:      Appearance: Normal appearance.  HENT:     Head: Normocephalic and atraumatic.     Comments: Periorbital ecchymosis and edema to the right eye. Swollen surrounding the eyeball. No lacerations to the skull.     Right Ear: Tympanic membrane normal.     Left Ear: Tympanic membrane normal.  Eyes:     General: No scleral icterus.       Right eye: No discharge.        Left eye: No discharge.     Comments: EOM limited due to swelling  Cardiovascular:     Rate and Rhythm: Normal rate. Rhythm irregular.     Pulses: Normal pulses.     Heart sounds: Normal heart sounds. No murmur heard. No friction rub. No gallop.   Pulmonary:     Effort: Pulmonary effort is normal. No respiratory distress.     Breath sounds: Normal breath sounds.  Abdominal:     General: There is no distension.  Musculoskeletal:        General: Normal range of motion.     Cervical back: Normal range of motion and neck supple. No tenderness.  Skin:    General: Skin is warm and dry.     Coloration: Skin is not jaundiced.     Findings: Bruising present.  Comments: Bruising around the right eye.  Neurological:     General: No focal deficit present.     Mental Status: She is alert and oriented to person, place, and time. Mental status is at baseline.     Coordination: Coordination normal.     ED Results / Procedures / Treatments   Labs (all labs ordered are listed, but only abnormal results are displayed) Labs Reviewed - No data to display  EKG None  Radiology No results found.  Procedures Procedures   Medications Ordered in ED Medications - No data to display  ED Course  I have reviewed the triage vital signs and the nursing notes.  Pertinent labs & imaging results that were available during my care of the patient were reviewed by me and considered in my medical decision making (see  chart for details).    MDM Rules/Calculators/A&P                          Patient is a pleasant 87 year old female with h/o vascular dementia and af on elliquis presenting for unwitnessed fall. Vitals stable, nontoxic appearing. Significant hematoma surrounding the right eye with significant swelling.   UA: Elevated ketones, no signs of infection BMP: no gross electrolyte derrangment. CBC: No anemia, no elevated WBC CT head, maxillofacial, cervical spine: see above for full read. Signs of swelling and small hematoma, but no acute fracture or acute pathology. EKG: Afib  Reassured by physical exam, labwork and imaging. Given lack of infectious findings or acute pathology from the fall, recommended patient follow up with their ophthalmoplegic within the next 2-3 days. Strict return instructions given. Patient and son verbalize understanding and agree with the plan.   Final Clinical Impression(s) / ED Diagnoses Final diagnoses:  None    Rx / DC Orders ED Discharge Orders    None       Sherrill Raring, [place] 11/24/20 1657    Noemi Chapel, MD 11/25/20 1615

## 2020-11-24 NOTE — Discharge Instructions (Addendum)
Please call to schedule an appointment with your ophthalmologist in the next 2-3 days. Your CT scan showed swelling but no acute fractures or bleeding. Please return back to the ER for any changes in mental status, intractable vomiting, severe headaches.

## 2020-11-24 NOTE — Progress Notes (Signed)
Orthopedic Tech Progress Note Patient Details:  [name] 03/18/1930 [id number] Level 2 trauma Patient ID: [name], female   DOB: March 30, 1930, 87 y.o.   MRN: [id number]   [name] 11/24/2020, 4:21 PM

## 2020-11-24 NOTE — ED Notes (Signed)
Patient transported to CT 

## 2020-11-24 NOTE — ED Provider Notes (Signed)
Medical screening examination/treatment/procedure(s) were conducted as a shared visit with non-physician practitioner(s) and myself.  I personally evaluated the patient during the encounter.  Clinical Impression:   Final diagnoses:  Fall, initial encounter   This patient is an 87 year old female on Eliquis for atrial fibrillation, she presents by EMS transport after she was found holding a frozen water bottle to her eye at 2 in the morning.  She lives with her son, her son is a Librarian, academic, he states that he saw the light on went to check on her and she was on the ground holding a water bottle to her eye.  She did not tell what happened, she does have some element of vascular dementia which is why he lives with her.  She ambulates independently without a cane or an assistive device.  He noted that after she had fallen and hit her head she refused to go back to sleep because she was having visual hallucinations thinking that somebody was in the room watching her.  She does not do this very often.  There was no symptoms prior to the fall and she was fine last night when he last saw her at 9:30 at night.  She refused to come to the hospital until after she woke up sometime around 10:30 in the morning, finally she agreed to come by ambulance.  On my exam the patient does have a significant hematoma surrounding the right eye in fact I cannot open the lids either upper or lower to visualize the eyeball itself.  She has some bony tenderness surrounding the orbital rim, she did not have any cervical tenderness but will not answer my questions well either.  She does follow commands in fact she is able to move all 4 extremities with normal strength, normal coordination, she is able to repeat words that I say and able to follow my finger visually with her left eye.  She can perform finger-nose-finger, her speech is clear, there is no obvious facial droop.  Her cardiac exam is unremarkable, mild atrial  fibrillation or slight irregular rhythm, she has a soft abdomen and clear heart and lung sounds.  CT scans of the head facial bones and cervical spine, will also progress with EKG labs and a urinalysis to make sure there is no signs of underlying infection.  Family member at the bedside agreeable to the work-up     Noemi Chapel, MD 11/25/20 1615

## 2020-11-25 ENCOUNTER — Encounter: Payer: Self-pay | Admitting: Internal Medicine

## 2020-12-08 ENCOUNTER — Other Ambulatory Visit: Payer: Self-pay

## 2020-12-08 ENCOUNTER — Encounter (INDEPENDENT_AMBULATORY_CARE_PROVIDER_SITE_OTHER): Payer: Self-pay | Admitting: Ophthalmology

## 2020-12-08 ENCOUNTER — Ambulatory Visit (INDEPENDENT_AMBULATORY_CARE_PROVIDER_SITE_OTHER): Payer: Medicare Other | Admitting: Ophthalmology

## 2020-12-08 DIAGNOSIS — E119 Type 2 diabetes mellitus without complications: Secondary | ICD-10-CM | POA: Diagnosis not present

## 2020-12-08 DIAGNOSIS — H353134 Nonexudative age-related macular degeneration, bilateral, advanced atrophic with subfoveal involvement: Secondary | ICD-10-CM | POA: Diagnosis not present

## 2020-12-08 NOTE — Assessment & Plan Note (Signed)
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

## 2020-12-08 NOTE — Progress Notes (Signed)
12/08/2020     CHIEF COMPLAINT Patient presents for Retina Follow Up (FU hospital visit fell one week ago , OD/Pt's Son states, "Pt fell in the middle of the night last week and she was seen in the ER." /Pt's son reports, ER w/u negative and no orbital fx noted. Pt denies any va changes or issues. /"just want to be checked out."/Denies any FOL/ floaters.)   HISTORY OF PRESENT ILLNESS: [name] is a 87 y.o. female who presents to the clinic today for:   HPI    Retina Follow Up    Diagnosis: Other   Laterality: right eye   Onset: 1 week ago   Duration: 1 week   Course: gradually improving   Comments: FU hospital visit fell one week ago , OD Pt's Son states, "Pt fell in the middle of the night last week and she was seen in the ER."  Pt's son reports, ER w/u negative and no orbital fx noted. Pt denies any va changes or issues.  "just want to be checked out." Denies any FOL/ floaters.       Last edited by Kendra Opitz, COA on 12/08/2020  1:37 PM. (History)      Referring physician: No referring provider defined for this encounter.  HISTORICAL INFORMATION:   Selected notes from the MEDICAL RECORD NUMBER    Lab Results  Component Value Date   HGBA1C 6.3 10/12/2020     CURRENT MEDICATIONS: No current outpatient medications on file. (Ophthalmic Drugs)   No current facility-administered medications for this visit. (Ophthalmic Drugs)   Current Outpatient Medications (Other)  Medication Sig  . acetaminophen (TYLENOL) 500 MG tablet Take 500 mg by mouth daily as needed for moderate pain.   Marland Kitchen apixaban (ELIQUIS) 2.5 MG TABS tablet Take 1 tablet (2.5 mg total) by mouth 2 (two) times daily.  . calcium carbonate (OS-CAL) 600 MG TABS tablet Take 600 mg by mouth 2 (two) times daily.   . Cholecalciferol (VITAMIN D3 SUPER STRENGTH) 50 MCG (2000 UT) TABS Take 2,000 Units by mouth daily with lunch.  Marland Kitchen HYDROcodone-acetaminophen (NORCO/VICODIN) 5-325 MG tablet Take 1 tablet by mouth  every 12 (twelve) hours as needed for moderate pain. For chronic back pain  . Magnesium 400 MG TABS Take 400 mg by mouth daily with lunch.  . metoprolol succinate (TOPROL-XL) 50 MG 24 hr tablet TAKE 1 TABLET (50 MG TOTAL) BY MOUTH 2 (TWO) TIMES DAILY. TAKE WITH OR IMMEDIATELY FOLLOWING A MEAL.  . multivitamin-lutein (OCUVITE-LUTEIN) CAPS capsule Take 1 capsule by mouth daily with lunch.   . Omega-3 Fatty Acids (FISH OIL) 1000 MG CAPS Take 1,000 mg by mouth daily with lunch.   . sertraline (ZOLOFT) 50 MG tablet Take 1 tablet (50 mg total) by mouth daily.  Marland Kitchen tretinoin (RETIN-A) 0.05 % cream Apply 1 application topically daily.    No current facility-administered medications for this visit. (Other)      REVIEW OF SYSTEMS:    ALLERGIES No Known Allergies  PAST MEDICAL HISTORY Past Medical History:  Diagnosis Date  . Anemia   . Arthritis    "back" (01/16/2018)  . Atrial fibrillation ([place])   . Cancer [facility])    low grade papillary, most recent recurrence 2011,  Dr Karsten Ro  . 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 History of blood transfusion 01/15/2018   S/P OR  . Hypertension   . Hyponatremia 01/26/12   Na+ 131  . Macular degeneration, bilateral    "  were wet; dry now" (01/16/2018)  . Other abnormal glucose 06/14/12   Fasting blood glucose 128; A1c 5.8% on 12/15/11   Past Surgical History:  Procedure Laterality Date  . ABDOMINAL HYSTERECTOMY  1970s   with oopherectomy for incidenctal cyst (no PMH of abnormal PAP)  . APPENDECTOMY     1970s  . BLADDER TUMOR EXCISION  2011 & 2013   Dr Karsten Ro  . CATARACT EXTRACTION, BILATERAL  2014   Dr Herbert Deaner  . COLONOSCOPY     negative; Dr Olevia Perches  . CYSTOSCOPY      multiple since tumor excision;Dr Ottelin  . DILATION AND CURETTAGE OF UTERUS    . FRACTURE SURGERY    . INTRAMEDULLARY (IM) NAIL INTERTROCHANTERIC Right 01/15/2018   Procedure: INTRAMEDULLARY (IM) NAIL INTERTROCHANTRIC;  Surgeon: Melrose Nakayama, MD;  Location: [facility];  Service: Orthopedics;  Laterality: Right;  . KYPHOPLASTY N/A 03/08/2018   Procedure: THORACIC 12 KYPHOPLASTY;  Surgeon: Phylliss Bob, MD;  Location: [facility];  Service: Orthopedics;  Laterality: N/A;  . TONSILLECTOMY      FAMILY HISTORY Family History  Problem Relation Age of Onset  . Stroke Mother 72  . Heart attack Father 3  . Coronary artery disease Brother        S/P CBAG ? @ 60  . Diabetes Brother   . Hypertension Brother   . Kidney disease Neg Hx   . Cancer Neg Hx   . Colon cancer Neg Hx   . Colon polyps Neg Hx     SOCIAL HISTORY Social History   Tobacco Use  . Smoking status: Never Smoker  . Smokeless tobacco: Never Used  Vaping Use  . Vaping Use: Never used  Substance Use Topics  . Alcohol use: Never    Alcohol/week: 0.0 standard drinks  . Drug use: Never         OPHTHALMIC EXAM: Base Eye Exam    Visual Acuity (ETDRS)      Right Left   Dist [place] CF at 3' 20/400   Dist ph [place]  20/60       Tonometry (Tonopen, 1:42 PM)      Right Left   Pressure 17 13       Pupils      Pupils Dark Light Shape React APD   Right PERRL 4 3 Round Brisk None   Left PERRL 4 3 Round Brisk None       Visual Fields (Counting fingers)      Left Right    Full Full       Extraocular Movement      Right Left    Full Full       Neuro/Psych    Oriented x3: Yes   Mood/Affect: Normal       Dilation    Both eyes: 1.0% Mydriacyl, 2.5% Phenylephrine @ 1:42 PM        Slit Lamp and Fundus Exam    External Exam      Right Left   External Normal Normal       Slit Lamp Exam      Right Left   Lids/Lashes Normal Normal   Conjunctiva/Sclera White and quiet White and quiet   Cornea Clear Clear   Anterior Chamber Deep and quiet Deep and quiet   Iris Round and reactive Round and reactive   Lens Posterior chamber intraocular lens Posterior chamber intraocular lens   Anterior Vitreous Normal Normal       Fundus Exam  Right Left   Posterior Vitreous Normal  Posterior vitreous detachment   Disc Normal Normal   C/D Ratio 0.05 0.1   Macula Choroidal atrophy, 10DA size in center Geographic atrophy 12 da size, small island of fovea RPE   Vessels Normal Normal   Periphery Normal Normal          IMAGING AND PROCEDURES  Imaging and Procedures for 12/08/20           ASSESSMENT/PLAN:  Advanced nonexudative age-related macular degeneration of both eyes with subfoveal involvement Advanced dry atrophic ARMD with loss of foveal pigment OD and accounting for acuity  OS with similar extensive dry atrophic ARMD yet with a small central island remaining of foveal pigment accounting for the 20/60 vision yet with likely "gunbarrel" type paracentral scotoma  Diabetes mellitus without complication ([place]) No DR      ICD-10-CM   1. Advanced nonexudative age-related macular degeneration of both eyes with subfoveal involvement  H35.3134   2. Diabetes mellitus without complication (HCC)  V61.6     1.  OU stable over time, dry atrophic ARMD with no signs of CNVM recurrence we will continue to observe  2.  3.  Ophthalmic Meds Ordered this visit:  No orders of the defined types were placed in this encounter.      Return in about 1 year (around 12/08/2021) for DILATE OU, COLOR FP.  There are no Patient Instructions on file for this visit.   Explained the diagnoses, plan, and follow up with the patient and they expressed understanding.  Patient expressed understanding of the importance of proper follow up care.   Clent Demark Ariellah Faust M.D. Diseases & Surgery of the Retina and Vitreous [facility]ina [facility] Diabetic [place] 12/08/20     Abbreviations: M myopia (nearsighted); A astigmatism; H hyperopia (farsighted); P presbyopia; Mrx spectacle prescription;  CTL contact lenses; OD right eye; OS left eye; OU both eyes  XT exotropia; ET esotropia; PEK punctate epithelial keratitis; PEE punctate epithelial erosions; DES dry eye syndrome; MGD meibomian gland  dysfunction; ATs artificial tears; PFAT's preservative free artificial tears; [place] nuclear sclerotic cataract; PSC posterior subcapsular cataract; ERM epi-retinal membrane; PVD posterior vitreous detachment; RD retinal detachment; DM diabetes mellitus; DR diabetic retinopathy; NPDR non-proliferative diabetic retinopathy; PDR proliferative diabetic retinopathy; CSME clinically significant macular edema; DME diabetic macular edema; dbh dot blot hemorrhages; CWS cotton wool spot; POAG primary open angle glaucoma; C/D cup-to-disc ratio; HVF humphrey visual field; GVF goldmann visual field; OCT optical coherence tomography; IOP intraocular pressure; BRVO Branch retinal vein occlusion; CRVO central retinal vein occlusion; CRAO central retinal artery occlusion; BRAO branch retinal artery occlusion; RT retinal tear; SB scleral buckle; PPV pars plana vitrectomy; VH Vitreous hemorrhage; PRP panretinal laser photocoagulation; IVK intravitreal kenalog; VMT vitreomacular traction; MH Macular hole;  NVD neovascularization of the disc; NVE neovascularization elsewhere; AREDS age related eye disease study; ARMD age related macular degeneration; POAG primary open angle glaucoma; EBMD epithelial/anterior basement membrane dystrophy; ACIOL anterior chamber intraocular lens; IOL intraocular lens; PCIOL posterior chamber intraocular lens; Phaco/IOL phacoemulsification with intraocular lens placement; [place] photorefractive keratectomy; LASIK laser assisted in situ keratomileusis; HTN hypertension; DM diabetes mellitus; COPD chronic obstructive pulmonary disease

## 2020-12-08 NOTE — Assessment & Plan Note (Signed)
No DR ?

## 2020-12-17 ENCOUNTER — Telehealth: Payer: Self-pay | Admitting: Internal Medicine

## 2020-12-17 DIAGNOSIS — S22080D Wedge compression fracture of T11-T12 vertebra, subsequent encounter for fracture with routine healing: Secondary | ICD-10-CM

## 2020-12-17 MED ORDER — HYDROCODONE-ACETAMINOPHEN 5-325 MG PO TABS: 1.0000 | ORAL_TABLET | Freq: Two times a day (BID) | ORAL | 0 refills | Status: DC | PRN

## 2020-12-17 NOTE — Telephone Encounter (Signed)
1.Medication Requested: HYDROcodone-acetaminophen (NORCO/VICODIN) 5-325 MG tablet    2. Pharmacy (Name, Street, [place]): [id number] [place], [place]  3. On Med List: yes   4. Last Visit with PCP: 10-12-20  5. Next visit date with PCP: 04-12-21   Agent: Please be advised that RX refills may take up to 3 business days. We ask that you follow-up with your pharmacy.

## 2020-12-18 ENCOUNTER — Other Ambulatory Visit: Payer: Self-pay | Admitting: Cardiovascular Disease

## 2021-01-19 ENCOUNTER — Telehealth: Payer: Self-pay | Admitting: Internal Medicine

## 2021-01-19 DIAGNOSIS — S22080D Wedge compression fracture of T11-T12 vertebra, subsequent encounter for fracture with routine healing: Secondary | ICD-10-CM

## 2021-01-19 MED ORDER — HYDROCODONE-ACETAMINOPHEN 5-325 MG PO TABS: 1.0000 | ORAL_TABLET | Freq: Two times a day (BID) | ORAL | 0 refills | Status: DC | PRN

## 2021-01-19 NOTE — Telephone Encounter (Signed)
   Request for refill for HYDROcodone-acetaminophen (NORCO/VICODIN) 5-325 MG tablet   Pharmacy [id number] - [place], [place]

## 2021-02-16 ENCOUNTER — Telehealth: Payer: Self-pay

## 2021-02-16 DIAGNOSIS — S22080D Wedge compression fracture of T11-T12 vertebra, subsequent encounter for fracture with routine healing: Secondary | ICD-10-CM

## 2021-02-16 NOTE — Telephone Encounter (Signed)
pt has called requesting a rx refill for her HYDROcodone-acetaminophen (NORCO/VICODIN) 5-325 MG tablet by Friday.

## 2021-02-17 MED ORDER — HYDROCODONE-ACETAMINOPHEN 5-325 MG PO TABS: 1.0000 | ORAL_TABLET | Freq: Two times a day (BID) | ORAL | 0 refills | Status: DC | PRN

## 2021-02-17 NOTE — Addendum Note (Signed)
Addended by: Binnie Rail on: 02/17/2021 04:46 PM   Modules accepted: Orders

## 2021-03-08 ENCOUNTER — Encounter (INDEPENDENT_AMBULATORY_CARE_PROVIDER_SITE_OTHER): Payer: Medicare Other | Admitting: Ophthalmology

## 2021-03-17 ENCOUNTER — Telehealth: Payer: Self-pay | Admitting: Internal Medicine

## 2021-03-17 DIAGNOSIS — S22080D Wedge compression fracture of T11-T12 vertebra, subsequent encounter for fracture with routine healing: Secondary | ICD-10-CM

## 2021-03-17 MED ORDER — HYDROCODONE-ACETAMINOPHEN 5-325 MG PO TABS: 1.0000 | ORAL_TABLET | Freq: Two times a day (BID) | ORAL | 0 refills | Status: AC | PRN

## 2021-03-17 NOTE — Telephone Encounter (Signed)
   Request for refill of HYDROcodone-acetaminophen (NORCO/VICODIN) 5-325 MG tablet  [id number] [place]

## 2021-04-01 ENCOUNTER — Ambulatory Visit (INDEPENDENT_AMBULATORY_CARE_PROVIDER_SITE_OTHER): Payer: Medicare Other

## 2021-04-01 ENCOUNTER — Encounter: Payer: Self-pay | Admitting: Podiatry

## 2021-04-01 ENCOUNTER — Other Ambulatory Visit: Payer: Self-pay

## 2021-04-01 ENCOUNTER — Ambulatory Visit: Payer: Medicare Other | Admitting: Podiatry

## 2021-04-01 DIAGNOSIS — L84 Corns and callosities: Secondary | ICD-10-CM | POA: Diagnosis not present

## 2021-04-01 DIAGNOSIS — M21611 Bunion of right foot: Secondary | ICD-10-CM

## 2021-04-01 DIAGNOSIS — D689 Coagulation defect, unspecified: Secondary | ICD-10-CM | POA: Diagnosis not present

## 2021-04-01 DIAGNOSIS — M21619 Bunion of unspecified foot: Secondary | ICD-10-CM

## 2021-04-01 DIAGNOSIS — M778 Other enthesopathies, not elsewhere classified: Secondary | ICD-10-CM | POA: Diagnosis not present

## 2021-04-01 MED ORDER — TRIAMCINOLONE ACETONIDE 10 MG/ML IJ SUSP
10.0000 mg | Freq: Once | INTRAMUSCULAR | Status: AC
  Administered 2021-04-01: 10 mg

## 2021-04-02 NOTE — Progress Notes (Signed)
Subjective:   Patient ID: [name], female   DOB: 87 y.o.   MRN: [id number]   HPI Patient presents with caregiver with painful callus bottom of her right foot that is been inflamed with fluid buildup and she is having trouble walking on it.  She is not in good physical health does not smoke likes to be active if possible but cannot because of the pain   Review of Systems  All other systems reviewed and are negative.      Objective:  Physical Exam Vitals and nursing note reviewed.  Constitutional:      Appearance: She is well-developed.  Pulmonary:     Effort: Pulmonary effort is normal.  Musculoskeletal:        General: Normal range of motion.  Skin:    General: Skin is warm.  Neurological:     Mental Status: She is alert.    Neurovascular status was found to be intact muscle strength was adequate with inflammation fluid of the first MPJ right that is localized with keratotic lesion that is painful when pressed which makes walking difficult.  Good digital perfusion well oriented x3     Assessment:  Inflammatory capsulitis of the plantar capsule first MPJ right with fluid buildup and lesion formation that is formed     Plan:  H&P reviewed condition and at this point I did do sterile prep and I injected the capsule of the first MPJ 3 mg Dexasone Kenalog 5 mg Xylocaine debrided the lesion and advised on reoccurrence for this condition.  May require more aggressive procedure in future

## 2021-04-04 ENCOUNTER — Emergency Department (HOSPITAL_COMMUNITY): Payer: Medicare Other

## 2021-04-04 ENCOUNTER — Encounter (HOSPITAL_COMMUNITY): Payer: Self-pay | Admitting: Emergency Medicine

## 2021-04-04 ENCOUNTER — Other Ambulatory Visit: Payer: Self-pay

## 2021-04-04 ENCOUNTER — Inpatient Hospital Stay (HOSPITAL_COMMUNITY)
Admission: EM | Admit: 2021-04-04 | Discharge: 2021-04-14 | DRG: 689 | Disposition: A | Discharge status: Skilled Nursing Facility | Payer: Medicare Other | Attending: Internal Medicine | Admitting: Internal Medicine

## 2021-04-04 DIAGNOSIS — Z79899 Other long term (current) drug therapy: Secondary | ICD-10-CM

## 2021-04-04 DIAGNOSIS — R443 Hallucinations, unspecified: Secondary | ICD-10-CM | POA: Diagnosis present

## 2021-04-04 DIAGNOSIS — M6282 Rhabdomyolysis: Secondary | ICD-10-CM

## 2021-04-04 DIAGNOSIS — F0391 Unspecified dementia with behavioral disturbance: Secondary | ICD-10-CM | POA: Diagnosis present

## 2021-04-04 DIAGNOSIS — F32A Depression, unspecified: Secondary | ICD-10-CM | POA: Diagnosis present

## 2021-04-04 DIAGNOSIS — I480 Paroxysmal atrial fibrillation: Secondary | ICD-10-CM | POA: Diagnosis present

## 2021-04-04 DIAGNOSIS — F0390 Unspecified dementia without behavioral disturbance: Secondary | ICD-10-CM | POA: Diagnosis not present

## 2021-04-04 DIAGNOSIS — I10 Essential (primary) hypertension: Secondary | ICD-10-CM | POA: Diagnosis present

## 2021-04-04 DIAGNOSIS — Z20822 Contact with and (suspected) exposure to covid-19: Secondary | ICD-10-CM | POA: Diagnosis present

## 2021-04-04 DIAGNOSIS — E119 Type 2 diabetes mellitus without complications: Secondary | ICD-10-CM | POA: Diagnosis present

## 2021-04-04 DIAGNOSIS — B9620 Unspecified Escherichia coli [E. coli] as the cause of diseases classified elsewhere: Secondary | ICD-10-CM | POA: Diagnosis present

## 2021-04-04 DIAGNOSIS — E860 Dehydration: Secondary | ICD-10-CM | POA: Diagnosis present

## 2021-04-04 DIAGNOSIS — M810 Age-related osteoporosis without current pathological fracture: Secondary | ICD-10-CM | POA: Diagnosis present

## 2021-04-04 DIAGNOSIS — Z8249 Family history of ischemic heart disease and other diseases of the circulatory system: Secondary | ICD-10-CM

## 2021-04-04 DIAGNOSIS — G9341 Metabolic encephalopathy: Secondary | ICD-10-CM | POA: Diagnosis present

## 2021-04-04 DIAGNOSIS — Z66 Do not resuscitate: Secondary | ICD-10-CM | POA: Diagnosis present

## 2021-04-04 DIAGNOSIS — R413 Other amnesia: Secondary | ICD-10-CM | POA: Diagnosis present

## 2021-04-04 DIAGNOSIS — Z8551 Personal history of malignant neoplasm of bladder: Secondary | ICD-10-CM

## 2021-04-04 DIAGNOSIS — Z681 Body mass index (BMI) 19 or less, adult: Secondary | ICD-10-CM

## 2021-04-04 DIAGNOSIS — D649 Anemia, unspecified: Secondary | ICD-10-CM | POA: Diagnosis present

## 2021-04-04 DIAGNOSIS — Z7722 Contact with and (suspected) exposure to environmental tobacco smoke (acute) (chronic): Secondary | ICD-10-CM | POA: Diagnosis present

## 2021-04-04 DIAGNOSIS — I700 Atherosclerosis of aorta: Secondary | ICD-10-CM | POA: Diagnosis present

## 2021-04-04 DIAGNOSIS — S0990XA Unspecified injury of head, initial encounter: Secondary | ICD-10-CM

## 2021-04-04 DIAGNOSIS — R441 Visual hallucinations: Secondary | ICD-10-CM

## 2021-04-04 DIAGNOSIS — E876 Hypokalemia: Secondary | ICD-10-CM | POA: Diagnosis present

## 2021-04-04 DIAGNOSIS — Z515 Encounter for palliative care: Secondary | ICD-10-CM | POA: Diagnosis not present

## 2021-04-04 DIAGNOSIS — Z7901 Long term (current) use of anticoagulants: Secondary | ICD-10-CM

## 2021-04-04 DIAGNOSIS — H3530 Unspecified macular degeneration: Secondary | ICD-10-CM | POA: Diagnosis present

## 2021-04-04 DIAGNOSIS — E559 Vitamin D deficiency, unspecified: Secondary | ICD-10-CM | POA: Diagnosis present

## 2021-04-04 DIAGNOSIS — I313 Pericardial effusion (noninflammatory): Secondary | ICD-10-CM | POA: Diagnosis present

## 2021-04-04 DIAGNOSIS — Z9842 Cataract extraction status, left eye: Secondary | ICD-10-CM

## 2021-04-04 DIAGNOSIS — R296 Repeated falls: Secondary | ICD-10-CM | POA: Diagnosis present

## 2021-04-04 DIAGNOSIS — Y92009 Unspecified place in unspecified non-institutional (private) residence as the place of occurrence of the external cause: Secondary | ICD-10-CM | POA: Diagnosis not present

## 2021-04-04 DIAGNOSIS — I2510 Atherosclerotic heart disease of native coronary artery without angina pectoris: Secondary | ICD-10-CM | POA: Diagnosis present

## 2021-04-04 DIAGNOSIS — W19XXXA Unspecified fall, initial encounter: Secondary | ICD-10-CM | POA: Diagnosis present

## 2021-04-04 DIAGNOSIS — Z833 Family history of diabetes mellitus: Secondary | ICD-10-CM

## 2021-04-04 DIAGNOSIS — N390 Urinary tract infection, site not specified: Secondary | ICD-10-CM | POA: Diagnosis present

## 2021-04-04 DIAGNOSIS — M4854XA Collapsed vertebra, not elsewhere classified, thoracic region, initial encounter for fracture: Secondary | ICD-10-CM | POA: Diagnosis present

## 2021-04-04 DIAGNOSIS — F03918 Unspecified dementia, unspecified severity, with other behavioral disturbance: Secondary | ICD-10-CM | POA: Insufficient documentation

## 2021-04-04 DIAGNOSIS — Z823 Family history of stroke: Secondary | ICD-10-CM

## 2021-04-04 DIAGNOSIS — Z7189 Other specified counseling: Secondary | ICD-10-CM | POA: Diagnosis not present

## 2021-04-04 DIAGNOSIS — F419 Anxiety disorder, unspecified: Secondary | ICD-10-CM | POA: Diagnosis present

## 2021-04-04 DIAGNOSIS — R911 Solitary pulmonary nodule: Secondary | ICD-10-CM | POA: Diagnosis not present

## 2021-04-04 DIAGNOSIS — S22080A Wedge compression fracture of T11-T12 vertebra, initial encounter for closed fracture: Secondary | ICD-10-CM | POA: Diagnosis not present

## 2021-04-04 DIAGNOSIS — T796XXA Traumatic ischemia of muscle, initial encounter: Secondary | ICD-10-CM | POA: Diagnosis present

## 2021-04-04 DIAGNOSIS — R4182 Altered mental status, unspecified: Secondary | ICD-10-CM | POA: Diagnosis not present

## 2021-04-04 DIAGNOSIS — E538 Deficiency of other specified B group vitamins: Secondary | ICD-10-CM | POA: Diagnosis present

## 2021-04-04 DIAGNOSIS — Z90710 Acquired absence of both cervix and uterus: Secondary | ICD-10-CM

## 2021-04-04 DIAGNOSIS — G8929 Other chronic pain: Secondary | ICD-10-CM | POA: Diagnosis present

## 2021-04-04 DIAGNOSIS — C679 Malignant neoplasm of bladder, unspecified: Secondary | ICD-10-CM | POA: Diagnosis present

## 2021-04-04 DIAGNOSIS — Z9841 Cataract extraction status, right eye: Secondary | ICD-10-CM

## 2021-04-04 LAB — COMPREHENSIVE METABOLIC PANEL
ALT: 22 U/L (ref 0–44)
AST: 37 U/L (ref 15–41)
Albumin: 3.5 g/dL (ref 3.5–5.0)
Alkaline Phosphatase: 91 U/L (ref 38–126)
Anion gap: 10 (ref 5–15)
BUN: 26 mg/dL — ABNORMAL HIGH (ref 8–23)
CO2: 26 mmol/L (ref 22–32)
Calcium: 9.9 mg/dL (ref 8.9–10.3)
Chloride: 101 mmol/L (ref 98–111)
Creatinine, Ser: 0.76 mg/dL (ref 0.44–1.00)
GFR, Estimated: >60 mL/min (ref >60)
Glucose, Bld: 136 mg/dL — ABNORMAL HIGH (ref 70–99)
Potassium: 3.4 mmol/L — ABNORMAL LOW (ref 3.5–5.1)
Sodium: 137 mmol/L (ref 135–145)
Total Bilirubin: 1.1 mg/dL (ref 0.3–1.2)
Total Protein: 7.1 g/dL (ref 6.5–8.1)

## 2021-04-04 LAB — TROPONIN I (HIGH SENSITIVITY)
Troponin I (High Sensitivity): 17 ng/L (ref <18)
Troponin I (High Sensitivity): 19 ng/L — ABNORMAL HIGH (ref <18)

## 2021-04-04 LAB — CBC WITH DIFFERENTIAL/PLATELET
Abs Immature Granulocytes: 0.08 10*3/uL — ABNORMAL HIGH (ref 0.00–0.07)
Basophils Absolute: 0 10*3/uL (ref 0.0–0.1)
Basophils Relative: 0 %
Eosinophils Absolute: 0 10*3/uL (ref 0.0–0.5)
Eosinophils Relative: 0 %
HCT: 38.9 % (ref 36.0–46.0)
Hemoglobin: 12.8 g/dL (ref 12.0–15.0)
Immature Granulocytes: 1 %
Lymphocytes Relative: 7 %
Lymphs Abs: 0.8 10*3/uL (ref 0.7–4.0)
MCH: 31.9 pg (ref 26.0–34.0)
MCHC: 32.9 g/dL (ref 30.0–36.0)
MCV: 97 fL (ref 80.0–100.0)
Monocytes Absolute: 1.6 10*3/uL — ABNORMAL HIGH (ref 0.1–1.0)
Monocytes Relative: 13 %
Neutro Abs: 9.8 10*3/uL — ABNORMAL HIGH (ref 1.7–7.7)
Neutrophils Relative %: 79 %
Platelets: 252 10*3/uL (ref 150–400)
RBC: 4.01 MIL/uL (ref 3.87–5.11)
RDW: 14.1 % (ref 11.5–15.5)
WBC: 12.3 10*3/uL — ABNORMAL HIGH (ref 4.0–10.5)
nRBC: 0 % (ref 0.0–0.2)

## 2021-04-04 LAB — URINALYSIS, MICROSCOPIC (REFLEX): WBC, UA: >50 WBC/hpf (ref 0–5)

## 2021-04-04 LAB — TSH: TSH: 0.652 u[IU]/mL (ref 0.350–4.500)

## 2021-04-04 LAB — URINALYSIS, ROUTINE W REFLEX MICROSCOPIC
Bilirubin Urine: NEGATIVE
Glucose, UA: NEGATIVE mg/dL
Ketones, ur: 15 mg/dL — AB
Nitrite: POSITIVE — AB
Protein, ur: 100 mg/dL — AB
Specific Gravity, Urine: >1.03 — ABNORMAL HIGH (ref 1.005–1.030)
pH: 6 (ref 5.0–8.0)

## 2021-04-04 LAB — RESP PANEL BY RT-PCR (FLU A&B, COVID) ARPGX2
Influenza A by PCR: NEGATIVE
Influenza B by PCR: NEGATIVE
SARS Coronavirus 2 by RT PCR: NEGATIVE

## 2021-04-04 LAB — PROTIME-INR
INR: 1.1 (ref 0.8–1.2)
Prothrombin Time: 14.3 seconds (ref 11.4–15.2)

## 2021-04-04 LAB — CK: Total CK: 1506 U/L — ABNORMAL HIGH (ref 38–234)

## 2021-04-04 LAB — APTT: aPTT: 38 seconds — ABNORMAL HIGH (ref 24–36)

## 2021-04-04 MED ORDER — SODIUM CHLORIDE 0.9 % IV SOLN
2.0000 g | Freq: Once | INTRAVENOUS | Status: AC
  Administered 2021-04-04: 2 g via INTRAVENOUS
  Filled 2021-04-04: qty 20

## 2021-04-04 MED ORDER — CEFTRIAXONE SODIUM 1 G IJ SOLR
1.0000 g | INTRAMUSCULAR | Status: AC
  Administered 2021-04-05 – 2021-04-09 (×5): 1 g via INTRAVENOUS
  Filled 2021-04-04 (×6): qty 10

## 2021-04-04 MED ORDER — SODIUM CHLORIDE 0.9 % IV SOLN: INTRAVENOUS | Status: AC

## 2021-04-04 MED ORDER — ACETAMINOPHEN 650 MG RE SUPP: 650.0000 mg | Freq: Four times a day (QID) | RECTAL | Status: DC | PRN

## 2021-04-04 MED ORDER — POTASSIUM CHLORIDE CRYS ER 20 MEQ PO TBCR
40.0000 meq | EXTENDED_RELEASE_TABLET | Freq: Once | ORAL | Status: AC
  Administered 2021-04-04: 40 meq via ORAL
  Filled 2021-04-04: qty 2

## 2021-04-04 MED ORDER — ACETAMINOPHEN 325 MG PO TABS
650.0000 mg | ORAL_TABLET | Freq: Four times a day (QID) | ORAL | Status: DC | PRN
  Administered 2021-04-05 – 2021-04-13 (×9): 650 mg via ORAL
  Filled 2021-04-04 (×9): qty 2

## 2021-04-04 NOTE — ED Provider Notes (Signed)
Emergency Medicine Provider Triage Evaluation Note  [name] , a 87 y.o. female  was evaluated in triage.  Pt has no complaints.  The son who is the power of attorney states that she had a fall last night where she fell backwards.  Had another fall this morning where she was laying on the floor prone.  Since the fall she has been increasingly altered.  Patient has a history of microvascular dementia.  Patient has no complaints.  She is anticoagulated on Eliquis  Review of Systems  Positive:  Negative: See above   Physical Exam  BP (!) 145/80 (BP Location: Left Arm)   Pulse 82   Temp 97.8 F (36.6 C) (Oral)   Resp 16   SpO2 100%  Gen:   Awake, no distress   Resp:  Normal effort  MSK:   Moves extremities without difficulty  Other:  Moderate contusion to the right shin  Medical Decision Making  Medically screening exam initiated at 1:43 PM.  Appropriate orders placed.  [name] was informed that the remainder of the evaluation will be completed by another provider, this initial triage assessment does not replace that evaluation, and the importance of remaining in the ED until their evaluation is complete.     [name] [place], PA-C 04/04/21 [place], MD 04/06/21 [phone]

## 2021-04-04 NOTE — H&P (Signed)
History and Physical    [name] [id number] DOB: 1929/07/23 DOA: 04/04/2021  PCP: Binnie Rail, MD Patient coming from: Home  Chief Complaint: Multiple falls  HPI: [name] is a 87 y.o. female with medical history significant of paroxysmal A. fib on Eliquis, diet-controlled type 2 diabetes, hypertension, anxiety and depression, memory problems, bilateral macular degeneration, osteoporosis, B12 deficiency, vitamin D deficiency, chronic back pain secondary to T12 compression fracture on hydrocodone, history of bladder cancer.  She presents to the ED today via EMS after having multiple falls since yesterday evening.  EMS was initially called out 2-3 times to get the patient up from the floor but she refused transport.  Patient's son found her between the bed and bedside dresser yesterday night.  He got her in bed and found her face down this morning on the floor again.  Son also reported that patient is having visual hallucinations, talking to her deceased dog.  Confused on arrival to the ED.  Labs notable for WBC 12.3.  Potassium 3.4.  Normal LFTs.  UA with positive nitrite, small amount of leukocytes, greater than 50 WBCs, and few bacteria.  High-sensitivity troponin borderline elevated but flat (17 >19).  COVID and influenza PCR negative.  CK elevated at 1506.  TSH pending.  Head CT negative for acute finding.  Chest x-ray showing a 1.9 cm nodular opacity in the right lower lobe, new since 2020.  Also showing slight progression of cardiomegaly since 2020.  Pelvic x-ray negative for fracture.  X-ray of right tibia/fibula negative for fracture.  CT C-spine negative for acute finding.  CT thoracic spine showing recent/unhealed T8 vertebral body fracture and chronic T11 and T12 compression fractures.  CT lumbar spine negative for acute finding. Patient was given ceftriaxone.  Patient is very confused and disoriented, not able to give any history.  No family at bedside.  I spoke to the  patient's son [name] over the phone who informed me that he is a Conservation officer, historic buildings.  Son states that the patient complained of severe back pain last week.  She has a history of thoracic compression fractures and he scheduled an appointment for her with orthopedics.  Otherwise, she was doing fine until yesterday, performing all ADLs independently.  Yesterday night around 10 PM when the patient's son went to check on her he found her down on the floor next to her bed.  No loss of consciousness.  Son helped her stand up and go back to bed.  He then checked on her a few times throughout the night and she was sleeping comfortably.  This morning when her son went back into the room to check on her, she was found facedown on the floor beside her bed which prompted him to call EMS.  Son is requesting social work consult for nursing home placement.  Review of Systems:  All systems reviewed and apart from history of presenting illness, are negative.  Past Medical History:  Diagnosis Date   Anemia    Arthritis    "back" (01/16/2018)   Atrial fibrillation (HCC)    Cancer (HCC)    low grade papillary, most recent recurrence 2011,  Dr Karsten Ro   Cardiac arrhythmia due to congenital heart disease    History of blood transfusion 01/15/2018   S/P OR   Hypertension    Hyponatremia 01/26/12   Na+ 131   Macular degeneration, bilateral    "were wet; dry now" (01/16/2018)   Other abnormal glucose 06/14/12  Fasting blood glucose 128; A1c 5.8% on 12/15/11    Past Surgical History:  Procedure Laterality Date   ABDOMINAL HYSTERECTOMY  1970s   with oopherectomy for incidenctal cyst (no PMH of abnormal PAP)   APPENDECTOMY     1970s   BLADDER TUMOR EXCISION  2011 & 2013   Dr Karsten Ro   CATARACT EXTRACTION, BILATERAL  2014   Dr Herbert Deaner   COLONOSCOPY     negative; Dr Olevia Perches   CYSTOSCOPY      multiple since tumor excision;Dr Ottelin   DILATION AND CURETTAGE OF UTERUS     FRACTURE SURGERY      INTRAMEDULLARY (IM) NAIL INTERTROCHANTERIC Right 01/15/2018   Procedure: INTRAMEDULLARY (IM) NAIL INTERTROCHANTRIC;  Surgeon: Melrose Nakayama, MD;  Location: [facility];  Service: Orthopedics;  Laterality: Right;   KYPHOPLASTY N/A 03/08/2018   Procedure: THORACIC 12 KYPHOPLASTY;  Surgeon: Phylliss Bob, MD;  Location: [facility];  Service: Orthopedics;  Laterality: N/A;   TONSILLECTOMY       reports that she has never smoked. She has never used smokeless tobacco. She reports that she does not drink alcohol and does not use drugs.  No Known Allergies  Family History  Problem Relation Age of Onset   Stroke Mother 91   Heart attack Father 82   Coronary artery disease Brother        S/P CBAG ? @ 34   Diabetes Brother    Hypertension Brother    Kidney disease Neg Hx    Cancer Neg Hx    Colon cancer Neg Hx    Colon polyps Neg Hx     Prior to Admission medications   Medication Sig Start Date End Date Taking? Authorizing Provider  acetaminophen (TYLENOL) 500 MG tablet Take 500 mg by mouth daily as needed for moderate pain.     [provider]  apixaban (ELIQUIS) 2.5 MG TABS tablet Take 1 tablet (2.5 mg total) by mouth 2 (two) times daily. 08/20/20   Binnie Rail, MD  calcium carbonate (OS-CAL) 600 MG TABS tablet Take 600 mg by mouth 2 (two) times daily.     [provider]  Cholecalciferol (VITAMIN D3 SUPER STRENGTH) 50 MCG (2000 UT) TABS Take 2,000 Units by mouth daily with lunch.    [provider]  HYDROcodone-acetaminophen (NORCO/VICODIN) 5-325 MG tablet Take 1 tablet by mouth every 12 (twelve) hours as needed for moderate pain. For chronic back pain 03/17/21   Binnie Rail, MD  Magnesium 400 MG TABS Take 400 mg by mouth daily with lunch.    [provider]  metoprolol succinate (TOPROL-XL) 50 MG 24 hr tablet TAKE 1 TABLET (50 MG TOTAL) BY MOUTH 2 (TWO) TIMES DAILY. TAKE WITH OR IMMEDIATELY FOLLOWING A MEAL. 12/18/20 03/18/21  O'Neal, Cassie Freer, MD   multivitamin-lutein [facility]) CAPS capsule Take 1 capsule by mouth daily with lunch.     [provider]  Omega-3 Fatty Acids (FISH OIL) 1000 MG CAPS Take 1,000 mg by mouth daily with lunch.     [provider]  sertraline (ZOLOFT) 50 MG tablet Take 1 tablet (50 mg total) by mouth daily. 10/12/20   Binnie Rail, MD  tretinoin (RETIN-A) 0.05 % cream Apply 1 application topically daily.     [provider]    Physical Exam: Vitals:   04/04/21 1657 04/04/21 1756 04/04/21 1859 04/04/21 1959  BP: (!) 160/107 (!) 146/84 (!) 140/94 (!) 147/92  Pulse: 76 84 86 93  Resp: (!) 22 (!)  $'23 18 14  'u$ Temp:      TempSrc:      SpO2: 95% 100% 98% 98%    Physical Exam Constitutional:      General: She is not in acute distress. HENT:     Head: Normocephalic and atraumatic.     Mouth/Throat:     Mouth: Mucous membranes are dry.     Comments: Very dry mucous membranes Eyes:     Extraocular Movements: Extraocular movements intact.     Conjunctiva/sclera: Conjunctivae normal.  Cardiovascular:     Rate and Rhythm: Normal rate and regular rhythm.     Pulses: Normal pulses.  Pulmonary:     Effort: Pulmonary effort is normal. No respiratory distress.     Breath sounds: Normal breath sounds. No wheezing or rales.  Abdominal:     General: Bowel sounds are normal. There is no distension.     Palpations: Abdomen is soft.     Tenderness: There is no abdominal tenderness.  Musculoskeletal:        General: No swelling or tenderness.     Cervical back: Normal range of motion and neck supple.  Skin:    General: Skin is warm and dry.  Neurological:     General: No focal deficit present.     Mental Status: She is alert.     Cranial Nerves: No cranial nerve deficit.     Sensory: No sensory deficit.     Motor: No weakness.     Comments: Oriented to self only Very confused     Labs on Admission: I have personally reviewed following labs and imaging studies  CBC: Recent  Labs  Lab 04/04/21 1343  WBC 12.3*  NEUTROABS 9.8*  HGB 12.8  HCT 38.9  MCV 97.0  PLT [id number]   Basic Metabolic Panel: Recent Labs  Lab 04/04/21 1343  NA 137  K 3.4*  CL 101  CO2 26  GLUCOSE 136*  BUN 26*  CREATININE 0.76  CALCIUM 9.9   GFR: CrCl cannot be calculated (Unknown ideal weight.). Liver Function Tests: Recent Labs  Lab 04/04/21 1343  AST 37  ALT 22  ALKPHOS 91  BILITOT 1.1  PROT 7.1  ALBUMIN 3.5   No results for input(s): LIPASE, AMYLASE in the last 168 hours. No results for input(s): AMMONIA in the last 168 hours. Coagulation Profile: Recent Labs  Lab 04/04/21 1540  INR 1.1   Cardiac Enzymes: Recent Labs  Lab 04/04/21 1540  CKTOTAL 1,506*   BNP (last 3 results) No results for input(s): PROBNP in the last 8760 hours. HbA1C: No results for input(s): HGBA1C in the last 72 hours. CBG: No results for input(s): GLUCAP in the last 168 hours. Lipid Profile: No results for input(s): CHOL, HDL, LDLCALC, TRIG, CHOLHDL, LDLDIRECT in the last 72 hours. Thyroid Function Tests: Recent Labs    04/04/21 1926  TSH 0.652   Anemia Panel: No results for input(s): VITAMINB12, FOLATE, FERRITIN, TIBC, IRON, RETICCTPCT in the last 72 hours. Urine analysis:    Component Value Date/Time   COLORURINE YELLOW 04/04/2021 [place] 04/04/2021 1343   LABSPEC >1.030 (H) 04/04/2021 1343   PHURINE 6.0 04/04/2021 1343   GLUCOSEU NEGATIVE 04/04/2021 1343   GLUCOSEU NEGATIVE 11/13/2017 1102   HGBUR LARGE (A) 04/04/2021 1343   HGBUR negative 09/10/2008 0752   BILIRUBINUR NEGATIVE 04/04/2021 1343   BILIRUBINUR negative 12/21/2018 1101   KETONESUR 15 (A) 04/04/2021 1343   PROTEINUR 100 (A) 04/04/2021 1343   UROBILINOGEN negative (  A) 12/21/2018 1101   UROBILINOGEN 0.2 11/13/2017 1102   NITRITE POSITIVE (A) 04/04/2021 1343   LEUKOCYTESUR SMALL (A) 04/04/2021 1343    Radiological Exams on Admission: DG Tibia/Fibula Right  Result Date:  04/04/2021 CLINICAL DATA:  Fall with bruising and pain EXAM: RIGHT TIBIA AND FIBULA - 2 VIEW COMPARISON:  None. FINDINGS: Mild osteoarthritis of the knee. Bony demineralization. Distal femoral IM nail noted. Atherosclerotic vascular calcifications. Bony demineralization. Degenerative midfoot arthropathy. No tibia/fibular fracture identified. No foreign body noted. IMPRESSION: 1. No acute bony findings. 2. Osteoarthritis of the knee. Degenerative findings in the midfoot. 3. Bony demineralization. 4. Atherosclerosis. Electronically Signed   By: Van Clines M.D.   On: 04/04/2021 16:47   CT Head Wo Contrast  Result Date: 04/04/2021 CLINICAL DATA:  Multiple falls, head trauma EXAM: CT HEAD WITHOUT CONTRAST TECHNIQUE: Contiguous axial images were obtained from the base of the skull through the vertex without intravenous contrast. COMPARISON:  None. FINDINGS: Brain: No acute intracranial hemorrhage. No focal mass lesion. No CT evidence of acute infarction. No midline shift or mass effect. No hydrocephalus. Basilar cisterns are patent. There are periventricular and subcortical white matter hypodensities. Generalized cortical atrophy. Vascular: No hyperdense vessel or unexpected calcification. Skull: Normal. Negative for fracture or focal lesion. Sinuses/Orbits: Paranasal sinuses and mastoid air cells are clear. Orbits are clear. Other: None. IMPRESSION: 1. No acute intracranial findings. 2. Chronic atrophy and white matter microvascular disease Electronically Signed   By: Suzy Bouchard M.D.   On: 04/04/2021 15:23   CT Cervical Spine Wo Contrast  Result Date: 04/04/2021 CLINICAL DATA:  Neck trauma (Age >= 65y).  Multiple recent falls. EXAM: CT CERVICAL SPINE WITHOUT CONTRAST TECHNIQUE: Multidetector CT imaging of the cervical spine was performed without intravenous contrast. Multiplanar CT image reconstructions were also generated. COMPARISON:  11/24/2020 FINDINGS: Alignment: Chronic reversal the normal  cervical lordosis with grade 1 anterolisthesis of C3 on C4 and grade 1 retrolisthesis of C4 on C5 and C5 on C6, unchanged. Mild left convex curvature of the cervical spine. Skull base and vertebrae: No acute fracture or suspicious osseous lesion. Soft tissues and spinal canal: No prevertebral fluid or swelling. No visible canal hematoma. Disc levels: Unchanged advanced disc space narrowing from C3-4 to C5-6 with degenerative endplate changes, greatest at C5-6. Multilevel facet arthrosis, severe on the left at C3-4. No evidence of high-grade spinal stenosis. Moderate to severe neural foraminal stenosis on the left at C3-4 and on the right at C5-6. Upper chest: Biapical pleuroparenchymal lung scarring with calcification. Other: None. IMPRESSION: 1. No acute cervical spine fracture. 2. Unchanged cervical advanced disc and facet degeneration. Electronically Signed   By: Logan Bores M.D.   On: 04/04/2021 18:03   CT Thoracic Spine Wo Contrast  Result Date: 04/04/2021 CLINICAL DATA:  Back trauma, no prior imaging (Age >= 16y). Multiple recent falls. EXAM: CT THORACIC SPINE WITHOUT CONTRAST TECHNIQUE: Multidetector CT images of the thoracic were obtained using the standard protocol without intravenous contrast. COMPARISON:  Chest radiographs 03/29/2018 FINDINGS: Alignment: Mild S-shaped thoracic scoliosis. No significant listhesis. Vertebrae: There is a T8 vertebral body fracture with compression of the superior and inferior endplates resulting in [id number] vertebral body height loss. Detailed characterization is limited by motion through this region, however the fracture appears recent/unhealed with a visible fracture line and mild sclerosis which could reflect early healing or trabecular impaction. There is no retropulsion, and no definite posterior element fracture is identified within limitations of motion. A T11 inferior endplate compression fracture  with mild vertebral body height loss is chronic and unchanged from  2019, and there is also a chronic, previously augmented T12 compression fracture with 4 mm retropulsion of the posterosuperior vertebral body. Paraspinal and other soft tissues: Biapical pleuroparenchymal lung scarring. Small right pleural effusion. Aortic and coronary atherosclerosis. Disc levels: T12 retropulsion results in mild spinal stenosis. IMPRESSION: 1. Recent/unhealed T8 vertebral body fracture with 45% height loss. No retropulsion or definite posterior element fracture identified within limitations of motion. 2. Chronic T11 and T12 compression fractures. 3. Small right pleural effusion. 4. Aortic Atherosclerosis (ICD10-I70.0). Electronically Signed   By: Logan Bores M.D.   On: 04/04/2021 18:25   CT Lumbar Spine Wo Contrast  Result Date: 04/04/2021 CLINICAL DATA:  Back trauma, no prior imaging (Age >= 16y). Multiple recent falls. EXAM: CT LUMBAR SPINE WITHOUT CONTRAST TECHNIQUE: Multidetector CT imaging of the lumbar spine was performed without intravenous contrast administration. Multiplanar CT image reconstructions were also generated. COMPARISON:  CT abdomen and pelvis 09/26/2008 FINDINGS: Segmentation: The lowest lumbar type vertebra is L5. There are no ribs at T12. Alignment: Severe dextroscoliosis with apex at L2. trace retrolisthesis of L2 on L3. Vertebrae: No acute fracture or suspicious osseous lesion. Angular deformity of the right L1 transverse process suggestive of a remote, healed fracture. Paraspinal and other soft tissues: Small right pleural effusion. Abdominal aortic atherosclerosis without aneurysm. Disc levels: Advanced disc degeneration from L1-2 to L4-5 with severe disc space narrowing and prominent degenerative endplate changes. Solid bridging left lateral vertebral osteophyte at L1-2. Moderate to severe spinal stenosis at L3-4, L4-5, and L5-S1 due to disc bulging and posterior element hypertrophy. Moderate to severe neural foraminal stenosis on the left at L3-4 and on the right  at L4-5. IMPRESSION: 1. No evidence of acute osseous abnormality. 2. Severe lumbar dextroscoliosis with advanced disc and facet degeneration. 3. Moderate to severe spinal stenosis at L3-4, L4-5, and L5-S1. 4. Aortic Atherosclerosis (ICD10-I70.0). Electronically Signed   By: Logan Bores M.D.   On: 04/04/2021 18:34   DG Pelvis Portable  Result Date: 04/04/2021 CLINICAL DATA:  Fall. EXAM: PORTABLE PELVIS 1-2 VIEWS COMPARISON:  January 14, 2018 FINDINGS: A gamma nail and femoral rod across a previously identified intertrochanteric fracture. No definitive acute fracture identified. IMPRESSION: No acute fracture noted. Electronically Signed   By: Dorise Bullion III M.D.   On: 04/04/2021 16:33   DG Chest Portable 1 View  Result Date: 04/04/2021 CLINICAL DATA:  Fall EXAM: PORTABLE CHEST 1 VIEW COMPARISON:  Chest radiograph 06/18/2019 FINDINGS: The heart is enlarged, slightly progressed since 2020. There is calcified atherosclerotic plaque of the aortic arch. The mediastinal contours are otherwise within normal limits. There is a 1.9 cm nodular opacity in the right lower lobe not present on the prior study. There is no other focal consolidation or pulmonary edema. There is no pleural effusion or pneumothorax. There is biapical pleural scarring, right more than left. There is no acute osseous abnormality. Post kyphoplasty changes are noted in the upper lumbar spine. IMPRESSION: 1. 1.9 cm nodular opacity in the right lower lobe not present in 2020. Recommend nonemergent chest CT to evaluate for parenchymal nodule. 2. Cardiomegaly which appears slightly progressed since 2020. Electronically Signed   By: Valetta Mole M.D.   On: 04/04/2021 16:18    EKG: Independently reviewed.  Rate controlled A. fib.  Assessment/Plan Principal Problem:   UTI (urinary tract infection) Active Problems:   Essential hypertension   Paroxysmal atrial fibrillation (HCC)   T12 compression fracture (  [place])   Acute metabolic  encephalopathy   UTI UA with signs of infection.  Mild leukocytosis but no other signs of sepsis. -Continue ceftriaxone.  Order urine culture.  Monitor WBC count.  Acute metabolic encephalopathy Head CT negative for acute finding.  No focal neurodeficit.  Encephalopathy likely secondary to UTI and dehydration. -Continue antibiotic and give IV fluid hydration.  Check TSH, B12, and ammonia levels.  Traumatic rhabdomyolysis In the setting of multiple falls.  No AKI. -IV fluid hydration, monitor CK  Mild hypokalemia -Monitor potassium and magnesium levels, replenish as needed.  Falls -PT/OT eval, fall precautions.  TOC consulted for nursing home placement.  Pulmonary nodule Chest x-ray showing a 1.9 cm nodular opacity in the right lower lobe, new since 2020.  Per son, patient has never smoked cigarettes herself but had longstanding secondhand smoke exposure as her husband smoked cigarettes. -Chest CT ordered for further evaluation  Chronic back pain secondary to T11-T12 compression fracture Recent/nonhealed T8 vertebral body fracture -Supportive care, pain management  Diet controlled type 2 diabetes A1c 6.3 on 10/12/2020.  Paroxysmal A. Fib: Currently rate controlled. Hypertension: Stable. Anxiety and depression -Resume home meds after pharmacy med rec is done.  DVT prophylaxis: Resume Eliquis after pharmacy med rec is done. Code Status: DNR based on prior records and confirmed by the patient's son. Family Communication: Son updated. Disposition Plan: Status is: Inpatient  Remains inpatient appropriate because:Altered mental status and Inpatient level of care appropriate due to severity of illness  Dispo: The patient is from: Home              Anticipated d/c is to: SNF              Patient currently is not medically stable to d/c.   Difficult to place patient No  Level of care: Level of care: Telemetry Medical  The medical decision making on this patient was of high  complexity and the patient is at high risk for clinical deterioration, therefore this is a level 3 visit.  Shela Leff MD [facility]  If 7PM-7AM, please contact night-coverage [email or web address]  04/04/2021, 9:21 PM

## 2021-04-04 NOTE — ED Notes (Signed)
Patient transported to CT 

## 2021-04-04 NOTE — ED Triage Notes (Signed)
Pt to triage via [facility] from home.  Pt has had multiple falls in the last 14 hrs.  EMS called out 2-3 times to get pt up out of floor.  Pt denied hitting her head and refused EMS transport x 2.  Takes Eliquis.  Decreased mental status since this morning with confusion and visual hallucinations.  Pt talking to her dog that died 49 yrs ago.  Pt denies pain.  Alert and oriented x 4 but confused about other things.  Thought EMS was taking her to church.

## 2021-04-04 NOTE — ED Provider Notes (Signed)
[facility] EMERGENCY DEPARTMENT Provider Note   CSN: [id number] Arrival date & time: 04/04/21  1319     History No chief complaint on file.   [name] is a 87 y.o. female.  Patient is a 87 yo female presenting with son from home for fall. Son states he found her between the bed and bedside dresser at 10/11PM last night. States he got her in bed and found her face down this morning on the floor again. Likely head trauma. On eliquis for afib. Pt had hx of dementia with baseline of being alert and oriented to self only. Not to place or time. Can walk unassisted at home. Son states today she was having visual hallucinations talking to his deceased dog. Denies hx of fevers, chills, nausea, vomiting, diarrhea, or coughing. No recent UTI's or antibiotic therapy.   The history is provided by the patient and a relative (son). No language interpreter was used.      Past Medical History:  Diagnosis Date   Anemia    Arthritis    "back" (01/16/2018)   Atrial fibrillation (HCC)    Cancer ([place])    low grade papillary, most recent recurrence 2011,  Dr Karsten Ro   Cardiac arrhythmia due to congenital heart disease    History of blood transfusion 01/15/2018   S/P OR   Hypertension    Hyponatremia 01/26/12   Na+ 131   Macular degeneration, bilateral    "were wet; dry now" (01/16/2018)   Other abnormal glucose 06/14/12   Fasting blood glucose 128; A1c 5.8% on 12/15/11    Patient Active Problem List   Diagnosis Date Noted   Chronic back pain 10/11/2020   Anxiety and depression 04/14/2020   Pseudophakia 03/05/2020   Advanced nonexudative age-related macular degeneration of both eyes with subfoveal involvement 03/05/2020   Jaw pain 02/14/2020   B12 deficiency 01/01/2020   Tinea corporis 09/09/2019   Hypomagnesemia 07/01/2019   Hypophosphatemia 07/01/2019   DNR (do not resuscitate) 06/19/2019   Syncope 06/17/2019   Memory difficulties 12/21/2018   Acute cystitis without  hematuria 12/21/2018   T12 compression fracture ([place]) 04/24/2018   Femur fracture, right ([place]) 01/14/2018   Macular degeneration (senile) of retina 06/14/2010   Vitamin D deficiency 03/10/2009   Osteoporosis 03/10/2009   NEOPLASM, MALIGNANT, BLADDER, HX OF 03/10/2009   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   Diabetes mellitus without complication ([place]) [id number]   Essential hypertension 08/09/2007   Paroxysmal atrial fibrillation ([place]) 08/09/2007    Past Surgical History:  Procedure Laterality Date   ABDOMINAL HYSTERECTOMY  1970s   with oopherectomy for incidenctal cyst (no PMH of abnormal PAP)   APPENDECTOMY     1970s   BLADDER TUMOR EXCISION  2011 & 2013   Dr Karsten Ro   CATARACT EXTRACTION, BILATERAL  2014   Dr Herbert Deaner   COLONOSCOPY     negative; Dr Olevia Perches   CYSTOSCOPY      multiple since tumor excision;Dr Ottelin   [place] (IM) NAIL INTERTROCHANTERIC Right 01/15/2018   Procedure: INTRAMEDULLARY (IM) NAIL INTERTROCHANTRIC;  Surgeon: Melrose Nakayama, MD;  Location: [facility];  Service: Orthopedics;  Laterality: Right;   KYPHOPLASTY N/A 03/08/2018   Procedure: THORACIC 12 KYPHOPLASTY;  Surgeon: Phylliss Bob, MD;  Location: [facility];  Service: Orthopedics;  Laterality: N/A;   TONSILLECTOMY       OB History   No obstetric history on file.  Family History  Problem Relation Age of Onset   Stroke Mother 93   Heart attack Father 10   Coronary artery disease Brother        S/P CBAG ? @ 43   Diabetes Brother    Hypertension Brother    Kidney disease Neg Hx    Cancer Neg Hx    Colon cancer Neg Hx    Colon polyps Neg Hx     Social History   Tobacco Use   Smoking status: Never   Smokeless tobacco: Never  Vaping Use   Vaping Use: Never used  Substance Use Topics   Alcohol use: Never    Alcohol/week: 0.0 standard drinks   Drug use: Never    Home Medications Prior to Admission medications   Medication Sig Start  Date End Date Taking? Authorizing Provider  acetaminophen (TYLENOL) 500 MG tablet Take 500 mg by mouth daily as needed for moderate pain.     [provider]  apixaban (ELIQUIS) 2.5 MG TABS tablet Take 1 tablet (2.5 mg total) by mouth 2 (two) times daily. 08/20/20   Binnie Rail, MD  calcium carbonate (OS-CAL) 600 MG TABS tablet Take 600 mg by mouth 2 (two) times daily.     [provider]  Cholecalciferol (VITAMIN D3 SUPER STRENGTH) 50 MCG (2000 UT) TABS Take 2,000 Units by mouth daily with lunch.    [provider]  HYDROcodone-acetaminophen (NORCO/VICODIN) 5-325 MG tablet Take 1 tablet by mouth every 12 (twelve) hours as needed for moderate pain. For chronic back pain 03/17/21   Binnie Rail, MD  Magnesium 400 MG TABS Take 400 mg by mouth daily with lunch.    [provider]  metoprolol succinate (TOPROL-XL) 50 MG 24 hr tablet TAKE 1 TABLET (50 MG TOTAL) BY MOUTH 2 (TWO) TIMES DAILY. TAKE WITH OR IMMEDIATELY FOLLOWING A MEAL. 12/18/20 03/18/21  O'Neal, Cassie Freer, MD  multivitamin-lutein [facility]) CAPS capsule Take 1 capsule by mouth daily with lunch.     [provider]  Omega-3 Fatty Acids (FISH OIL) 1000 MG CAPS Take 1,000 mg by mouth daily with lunch.     [provider]  sertraline (ZOLOFT) 50 MG tablet Take 1 tablet (50 mg total) by mouth daily. 10/12/20   Binnie Rail, MD  tretinoin (RETIN-A) 0.05 % cream Apply 1 application topically daily.     [provider]    Allergies    Patient has no known allergies.  Review of Systems   Review of Systems  Unable to perform ROS: Mental status change  Psychiatric/Behavioral:  Positive for hallucinations.    Physical Exam Updated Vital Signs BP (!) 152/88   Pulse 99   Temp 97.8 F (36.6 C) (Oral)   Resp 12   SpO2 100%   Physical Exam Vitals and nursing note reviewed.  Constitutional:      General: She is not in acute distress.    Appearance: She is  well-developed.  HENT:     Head: Normocephalic and atraumatic.  Eyes:     General: Lids are normal. Vision grossly intact.     Conjunctiva/sclera: Conjunctivae normal.     Pupils: Pupils are equal, round, and reactive to light.  Cardiovascular:     Rate and Rhythm: Normal rate and regular rhythm.     Heart sounds: No murmur heard. Pulmonary:     Effort: Pulmonary effort is normal. No respiratory distress.     Breath sounds: Normal breath sounds.  Abdominal:  Palpations: Abdomen is soft.     Tenderness: There is no abdominal tenderness.  Musculoskeletal:     Cervical back: Neck supple.     Right lower leg: Bony tenderness present.       Legs:  Skin:    General: Skin is warm and dry.  Neurological:     Mental Status: She is alert.     GCS: GCS eye subscore is 4. GCS verbal subscore is 4. GCS motor subscore is 6.     Cranial Nerves: Cranial nerves are intact.     Sensory: Sensation is intact.     Motor: Motor function is intact.     Comments: Oriented to self only    ED Results / Procedures / Treatments   Labs (all labs ordered are listed, but only abnormal results are displayed) Labs Reviewed  CBC WITH DIFFERENTIAL/PLATELET - Abnormal; Notable for the following components:      Result Value   WBC 12.3 (*)    Neutro Abs 9.8 (*)    Monocytes Absolute 1.6 (*)    Abs Immature Granulocytes 0.08 (*)    All other components within normal limits  COMPREHENSIVE METABOLIC PANEL - Abnormal; Notable for the following components:   Potassium 3.4 (*)    Glucose, Bld 136 (*)    BUN 26 (*)    All other components within normal limits  URINALYSIS, ROUTINE W REFLEX MICROSCOPIC    EKG None  Radiology No results found.  Procedures Procedures   Medications Ordered in ED Medications - No data to display  ED Course  I have reviewed the triage vital signs and the nursing notes.  Pertinent labs & imaging results that were available during my care of the patient were reviewed  by me and considered in my medical decision making (see chart for details).    MDM Rules/Calculators/A&P                          3:58 PM 87 yo female presenting with son from home for fall with unknown head trauma. Patient is alert and oriented to self only. Hx of hallucinations noted today by son.   -Stable CT head and neck -Lumbar back pain on exam. CT spine demonstrates no acute fractures -Stable EKG, troponin's, electrolytes, and CXR. -elevated CPK likely from down time after fall. Maintenance fluids.  -UA positive for UTI likely resulting in AMS. Rocephin given in ED.  -No signs/symptoms of sepsis  I spoke with patient's son who states he feels unsafe to take patient home at this time due to hallucinations and fall. Patient accepted by hospitalist.   Final Clinical Impression(s) / ED Diagnoses Final diagnoses:  Urinary tract infection without hematuria, site unspecified  Fall, initial encounter  Traumatic injury of head, initial encounter  Altered mental status, unspecified altered mental status type  Visual hallucination    Rx / DC Orders ED Discharge Orders     None        Lianne Cure, DO [id number] 2012

## 2021-04-04 NOTE — ED Notes (Signed)
Patient transported to X-ray 

## 2021-04-04 NOTE — ED Notes (Signed)
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

## 2021-04-05 ENCOUNTER — Inpatient Hospital Stay (HOSPITAL_COMMUNITY): Payer: Medicare Other

## 2021-04-05 DIAGNOSIS — F0391 Unspecified dementia with behavioral disturbance: Secondary | ICD-10-CM | POA: Insufficient documentation

## 2021-04-05 DIAGNOSIS — F0390 Unspecified dementia without behavioral disturbance: Secondary | ICD-10-CM | POA: Diagnosis not present

## 2021-04-05 DIAGNOSIS — G9341 Metabolic encephalopathy: Secondary | ICD-10-CM | POA: Diagnosis not present

## 2021-04-05 DIAGNOSIS — S22080A Wedge compression fracture of T11-T12 vertebra, initial encounter for closed fracture: Secondary | ICD-10-CM

## 2021-04-05 DIAGNOSIS — W19XXXA Unspecified fall, initial encounter: Secondary | ICD-10-CM

## 2021-04-05 DIAGNOSIS — Y92009 Unspecified place in unspecified non-institutional (private) residence as the place of occurrence of the external cause: Secondary | ICD-10-CM

## 2021-04-05 DIAGNOSIS — I700 Atherosclerosis of aorta: Secondary | ICD-10-CM

## 2021-04-05 DIAGNOSIS — I480 Paroxysmal atrial fibrillation: Secondary | ICD-10-CM

## 2021-04-05 DIAGNOSIS — R911 Solitary pulmonary nodule: Secondary | ICD-10-CM

## 2021-04-05 DIAGNOSIS — N390 Urinary tract infection, site not specified: Secondary | ICD-10-CM | POA: Diagnosis not present

## 2021-04-05 DIAGNOSIS — F03918 Unspecified dementia, unspecified severity, with other behavioral disturbance: Secondary | ICD-10-CM | POA: Insufficient documentation

## 2021-04-05 DIAGNOSIS — M6282 Rhabdomyolysis: Secondary | ICD-10-CM

## 2021-04-05 LAB — BASIC METABOLIC PANEL
Anion gap: 14 (ref 5–15)
BUN: 26 mg/dL — ABNORMAL HIGH (ref 8–23)
CO2: 19 mmol/L — ABNORMAL LOW (ref 22–32)
Calcium: 9.3 mg/dL (ref 8.9–10.3)
Chloride: 104 mmol/L (ref 98–111)
Creatinine, Ser: 0.7 mg/dL (ref 0.44–1.00)
GFR, Estimated: >60 mL/min (ref >60)
Glucose, Bld: 152 mg/dL — ABNORMAL HIGH (ref 70–99)
Potassium: 4.4 mmol/L (ref 3.5–5.1)
Sodium: 137 mmol/L (ref 135–145)

## 2021-04-05 LAB — MAGNESIUM: Magnesium: 1.8 mg/dL (ref 1.7–2.4)

## 2021-04-05 LAB — CBC
HCT: 38.1 % (ref 36.0–46.0)
Hemoglobin: 12.5 g/dL (ref 12.0–15.0)
MCH: 32.5 pg (ref 26.0–34.0)
MCHC: 32.8 g/dL (ref 30.0–36.0)
MCV: 99 fL (ref 80.0–100.0)
Platelets: 223 10*3/uL (ref 150–400)
RBC: 3.85 MIL/uL — ABNORMAL LOW (ref 3.87–5.11)
RDW: 14.3 % (ref 11.5–15.5)
WBC: 11.6 10*3/uL — ABNORMAL HIGH (ref 4.0–10.5)
nRBC: 0 % (ref 0.0–0.2)

## 2021-04-05 LAB — CK: Total CK: 1624 U/L — ABNORMAL HIGH (ref 38–234)

## 2021-04-05 MED ORDER — APIXABAN 2.5 MG PO TABS
2.5000 mg | ORAL_TABLET | Freq: Two times a day (BID) | ORAL | Status: DC
  Administered 2021-04-05 – 2021-04-14 (×19): 2.5 mg via ORAL
  Filled 2021-04-05 (×19): qty 1

## 2021-04-05 MED ORDER — METOPROLOL SUCCINATE ER 50 MG PO TB24
50.0000 mg | ORAL_TABLET | Freq: Two times a day (BID) | ORAL | Status: DC
  Administered 2021-04-05 – 2021-04-14 (×19): 50 mg via ORAL
  Filled 2021-04-05 (×8): qty 1
  Filled 2021-04-05: qty 2
  Filled 2021-04-05 (×10): qty 1

## 2021-04-05 MED ORDER — SERTRALINE HCL 50 MG PO TABS
50.0000 mg | ORAL_TABLET | Freq: Every day | ORAL | Status: DC
  Administered 2021-04-05 – 2021-04-14 (×10): 50 mg via ORAL
  Filled 2021-04-05 (×10): qty 1

## 2021-04-05 MED ORDER — LORAZEPAM 2 MG/ML IJ SOLN
0.2500 mg | Freq: Once | INTRAMUSCULAR | Status: AC
  Administered 2021-04-05: 0.25 mg via INTRAVENOUS
  Filled 2021-04-05: qty 1

## 2021-04-05 MED ORDER — SODIUM CHLORIDE 0.9 % IV SOLN: INTRAVENOUS | Status: DC

## 2021-04-05 NOTE — ED Notes (Signed)
Dr.Goodrich notified of pt HR 100-140s bpm when pt is moving. Pt denies any cp, sob or any discomfort. Not a good historian as pt has dementia. Pt VS stable. EKG done and Dr.Goodrich notified of EKG available in epic. Will continue to monitor.

## 2021-04-05 NOTE — Assessment & Plan Note (Signed)
--  no inpt treatment indicated

## 2021-04-05 NOTE — ED Notes (Signed)
Went in and changed patients bed, no other complaints

## 2021-04-05 NOTE — Assessment & Plan Note (Signed)
--  no culture obtained --will treat empirically 3 days

## 2021-04-05 NOTE — Assessment & Plan Note (Addendum)
--  Recommend follow-up CT in 3 months to ensure resolution

## 2021-04-05 NOTE — Progress Notes (Signed)
PROGRESS NOTE  Janet KAEO [id number] DOB: 24-Feb-1930 DOA: 04/04/2021 PCP: Binnie Rail, MD  Brief History   87 year old woman with complicated past medical history presented to emergency department from home after multiple falls and being discovered by her son.  Recent history of visual hallucinations, talking to deceased dog, increased confusion, recent severe back pain prior to admission for which outpatient follow-up was planned, admitted for UTI, metabolic encephalopathy, mild rhabdomyolysis, multiple falls. Son requested placement.  A & P  * UTI (urinary tract infection) --no culture obtained --will treat empirically 3 days  Acute metabolic encephalopathy --secondary to UTI, complicated by dementia. CT head negative, no focal deficits. --treat infection  Dementia without behavioral disturbance (HCC) --fall and delirium precautions --continue sertraline  Paroxysmal atrial fibrillation ([place]) --now w/ AVR; will order home Toprol, resume apixaban given no injuries noted; if uncontrolled may need dilt gtt, will upgrade bed to PCU --will need to discuss longterm r/b with family of apixaban  Rhabdomyolysis --mild, secondary to falls at home --IVF, check CK in AM, BMP  Fall at home, initial encounter --multiple at home, no apparent injury --consider r/b of apixaban as outpatient  T12 compression fracture (HCC) --no complicating features noted --pain control, PT consult  Aortic atherosclerosis (HCC) --no inpt treatment indicated  Pulmonary nodule --f/u cT chest   Diabetes mellitus without complication ([place]) --diet-controlled, A1c 6.3 on 10/12/2020.  Disposition Plan:  Discussion: get HR uncontrol, tx infection, f/u PT recs  Status is: Inpatient  Remains inpatient appropriate because:IV treatments appropriate due to intensity of illness or inability to take PO and Inpatient level of care appropriate due to severity of illness  Dispo: The patient is from: Home               Anticipated d/c is to:  TBD              Patient currently is not medically stable to d/c.   Difficult to place patient No  DVT prophylaxis: apixaban (ELIQUIS) tablet 2.5 mg Start: 04/05/21 1000 apixaban (ELIQUIS) tablet 2.5 mg    Code Status: DNR Level of care: Telemetry Medical Family Communication: son [name] by telephone Lives with son Most recent fall was May until this past weekend Dementia progressively worse last 2 months He desires placement, he understands this may not be recommended or possible and that she might have to return home Dr Lynann Bologna did kyphoplasty in past  Time >35 minutes, >50% counseling and coordination of care  Murray Hodgkins, MD  [facility] Direct contact: see [email or web address] (further directions at bottom of note if needed) 7PM-7AM contact night coverage as at bottom of note 04/05/2021, 9:47 AM  LOS: 1 day   Significant Hospital Events   9/18 admit for confusion, UTI   Consults:  None    Procedures:  None   Significant Diagnostic Tests:  Ceftriaxone 9/18 >    Micro Data:  UC was not ordered   Antimicrobials:    Interval History/Subjective  CC: f/u falls  Feels ok, confused and history is limited from pt  Per RN HR 100s at rest, up to 140s w/ movement.  Objective   Vitals:  Vitals:   04/05/21 0750 04/05/21 0900  BP:  (!) 152/82  Pulse:  (!) 101  Resp:  (!) 22  Temp: 98.1 F (36.7 C)   SpO2:  95%    Exam: Physical Exam Constitutional:      General: She is not in acute distress.    Appearance: She is  not ill-appearing or toxic-appearing.  Cardiovascular:     Rate and Rhythm: Tachycardia present. Rhythm irregular.     Heart sounds: No murmur heard.   No friction rub. No gallop.     Comments: Telemetry afib RVR Pulmonary:     Effort: No respiratory distress.     Breath sounds: No wheezing, rhonchi or rales.  Musculoskeletal:     Right lower leg: No edema.     Left lower leg: No edema.  Skin:     Findings: Bruising present.  Neurological:     General: No focal deficit present.     Mental Status: She is alert. She is disoriented.  Psychiatric:        Mood and Affect: Mood normal.     Comments: Confused    I have personally reviewed the labs and other data, making special note of:   Today's Data  BMP noted CK w/o change, 1624 CBC stable  Scheduled Meds:  apixaban  2.5 mg Oral BID   metoprolol succinate  50 mg Oral BID   sertraline  50 mg Oral Daily   Continuous Infusions:  cefTRIAXone (ROCEPHIN)  IV      Principal Problem:   UTI (urinary tract infection) Active Problems:   Acute metabolic encephalopathy   Paroxysmal atrial fibrillation (HCC)   Dementia without behavioral disturbance (HCC)   T12 compression fracture (HCC)   Fall at home, initial encounter   Rhabdomyolysis   Essential hypertension   Diabetes mellitus without complication ([place])   DNR (do not resuscitate)   Pulmonary nodule   Aortic atherosclerosis ([place])   LOS: 1 day   How to contact the [facility] Attending or Consulting provider 7A - 7P or covering provider during after hours [place], for this patient?  Check the care team in [facility] and look for a) attending/consulting TRH provider listed and b) the [facility] team listed Log into [email or web address] and use [place]'s universal password to access. If you do not have the password, please contact the hospital operator. Locate the [facility] provider you are looking for under [facility] and page to a number that you can be directly reached. If you still have difficulty reaching the provider, please page the [facility] (Director on Call) for the Hospitalists listed on amion for assistance.

## 2021-04-05 NOTE — Assessment & Plan Note (Addendum)
--  mild, secondary to falls at home. CK trending down --IVF, check CK in AM, BMP

## 2021-04-05 NOTE — Assessment & Plan Note (Signed)
--  no complicating features noted --pain control, PT consult

## 2021-04-05 NOTE — Assessment & Plan Note (Signed)
--  fall and delirium precautions --continue sertraline

## 2021-04-05 NOTE — Assessment & Plan Note (Addendum)
--  secondary to UTI, complicated by dementia. CT head negative, no focal deficits. --treat infection

## 2021-04-05 NOTE — Assessment & Plan Note (Signed)
--  diet-controlled, A1c 6.3 on 10/12/2020.

## 2021-04-05 NOTE — Evaluation (Signed)
Physical Therapy Evaluation Patient Details Name: [name] MRN: [id number] DOB: Aug 06, 1929 Today's Date: 04/05/2021  History of Present Illness  Pt is an 87 y/o female admitted secondary to multiple falls and confusion. Thought to be secondary to UTI. Also found to have T8 compression fx to be managed conservatively. PMH includes HTN, a fib, DM, osteoporosis, bladder cancer, and chronic thoracic compression fx.  Clinical Impression  Pt admitted secondary to problem above with deficits below. Pt requiring min to mod A for bed mobility and mod A to stand at EOB. Upon standing, HR up to low 170s, so immediately returned to sitting and back to supine. HR returned to low 110s upon return to supine; RN notified. Pt with multiple falls at home. Feel she would benefit from SNF level therapies at d/c. Will continue to follow acutely.      Recommendations for follow up therapy are one component of a multi-disciplinary discharge planning process, led by the attending physician.  Recommendations may be updated based on patient status, additional functional criteria and insurance authorization.  Follow Up Recommendations SNF;Supervision/Assistance - 24 hour    Equipment Recommendations  None recommended by PT    Recommendations for Other Services       Precautions / Restrictions Precautions Precautions: Fall;Back Precaution Booklet Issued: No Precaution Comments: Watch HR; Multiple falls at home Restrictions Weight Bearing Restrictions: No      Mobility  Bed Mobility Overal bed mobility: Needs Assistance Bed Mobility: Rolling;Sidelying to Sit;Sit to Sidelying Rolling: Min assist Sidelying to sit: Mod assist     Sit to sidelying: Mod assist General bed mobility comments: Required assist with trunk and LE throughout. Cues for log roll technique to help with pain management. Brief period of HR into low 150s, but returned to low 110s with seated rest.    Transfers Overall transfer  level: Needs assistance Equipment used: 1 person hand held assist Transfers: Sit to/from Stand Sit to Stand: Mod assist         General transfer comment: Mod A for lift assist and steadying. Upon standing, HR elevated to low 170s, so had pt return to sitting and then supine. HR returned to low 110s upon return to supine. RN notified.  Ambulation/Gait                Stairs            Wheelchair Mobility    Modified Rankin (Stroke Patients Only)       Balance Overall balance assessment: Needs assistance Sitting-balance support: Feet supported;Bilateral upper extremity supported Sitting balance-Leahy Scale: Poor Sitting balance - Comments: Reliant on UE support   Standing balance support: Bilateral upper extremity supported Standing balance-Leahy Scale: Poor Standing balance comment: Reliant on UE and external support                             Pertinent Vitals/Pain Pain Assessment: Faces Faces Pain Scale: Hurts even more Pain Location: back Pain Descriptors / Indicators: Grimacing;Guarding Pain Intervention(s): Limited activity within patient's tolerance;Monitored during session;Repositioned    Home Living Family/patient expects to be discharged to:: Private residence Living Arrangements: Alone Available Help at Discharge: Family;Available PRN/intermittently Type of Home: Apartment Home Access: Stairs to enter   Entrance Stairs-Number of Steps: 3-4 steps Home Layout: One level Home Equipment: Walker - 2 wheels;Cane - single point      Prior Function Level of Independence: Needs assistance      ADL's /  Homemaking Assistance Needed: Reports she has help sometimes, but unable to clarify what kind of help  Comments: Reports she uses cane     Hand Dominance        Extremity/Trunk Assessment   Upper Extremity Assessment Upper Extremity Assessment: Defer to OT evaluation    Lower Extremity Assessment Lower Extremity Assessment:  Generalized weakness    Cervical / Trunk Assessment Cervical / Trunk Assessment: Other exceptions Cervical / Trunk Exceptions: T8 compression fx.  Communication   Communication: No difficulties  Cognition Arousal/Alertness: Awake/alert Behavior During Therapy: WFL for tasks assessed/performed Overall Cognitive Status: No family/caregiver present to determine baseline cognitive functioning                                 General Comments: Reports she was born in 1964. Reports it is currently 48. Disoriented to place, situation. Very slow to respond and reporting confliting information.      General Comments      Exercises     Assessment/Plan    PT Assessment Patient needs continued PT services  PT Problem List Decreased strength;Decreased balance;Decreased activity tolerance;Decreased mobility;Decreased knowledge of use of DME;Decreased knowledge of precautions;Decreased cognition;Decreased safety awareness;Pain       PT Treatment Interventions Gait training;DME instruction;Functional mobility training;Therapeutic activities;Therapeutic exercise;Balance training;Patient/family education;Cognitive remediation    PT Goals (Current goals can be found in the Care Plan section)  Acute Rehab PT Goals Patient Stated Goal: none stated PT Goal Formulation: With patient Time For Goal Achievement: 04/19/21 Potential to Achieve Goals: Good    Frequency Min 2X/week   Barriers to discharge        Co-evaluation               AM-PAC PT "6 Clicks" Mobility  Outcome Measure Help needed turning from your back to your side while in a flat bed without using bedrails?: A Little Help needed moving from lying on your back to sitting on the side of a flat bed without using bedrails?: A Lot Help needed moving to and from a bed to a chair (including a wheelchair)?: A Lot Help needed standing up from a chair using your arms (e.g., wheelchair or bedside chair)?: A Lot Help  needed to walk in hospital room?: Total Help needed climbing 3-5 steps with a railing? : Total 6 Click Score: 11    End of Session Equipment Utilized During Treatment: Gait belt Activity Tolerance: Treatment limited secondary to medical complications (Comment) (elevated HR) Patient left: in bed;with call bell/phone within reach;with nursing/sitter in room (on stretcher in E D) Nurse Communication: Mobility status PT Visit Diagnosis: Unsteadiness on feet (R26.81);Muscle weakness (generalized) (M62.81);Difficulty in walking, not elsewhere classified (R26.2)    Time: [id number] PT Time Calculation (min) (ACUTE ONLY): 19 min   Charges:   PT Evaluation $PT Eval Moderate Complexity: 1 Mod          Reuel Derby, PT, DPT  Acute Rehabilitation Services  Pager: [phone] Office: [phone]   Rudean Hitt 04/05/2021, 10:00 AM

## 2021-04-05 NOTE — ED Notes (Signed)
Breakfast Ordered 

## 2021-04-05 NOTE — Assessment & Plan Note (Signed)
--  multiple at home, no apparent injury --consider r/b of apixaban as outpatient

## 2021-04-05 NOTE — Assessment & Plan Note (Addendum)
--  recurrent AVR this AM, now controlled w/ dilt bolus; will continue home Toprol, apixaban  --will need to discuss longterm r/b with family of apixaban, broached w/ son at bedside yesterday

## 2021-04-05 NOTE — Hospital Course (Addendum)
87 year old woman with complicated past medical history presented to emergency department from home after multiple falls and being discovered by her son.  Recent history of visual hallucinations, talking to deceased dog, increased confusion, recent severe back pain prior to admission for which outpatient follow-up was planned, admitted for UTI, metabolic encephalopathy, mild rhabdomyolysis, multiple falls. Son requested placement.

## 2021-04-06 DIAGNOSIS — M6282 Rhabdomyolysis: Secondary | ICD-10-CM | POA: Diagnosis not present

## 2021-04-06 DIAGNOSIS — N390 Urinary tract infection, site not specified: Secondary | ICD-10-CM | POA: Diagnosis not present

## 2021-04-06 DIAGNOSIS — G9341 Metabolic encephalopathy: Secondary | ICD-10-CM | POA: Diagnosis not present

## 2021-04-06 DIAGNOSIS — I480 Paroxysmal atrial fibrillation: Secondary | ICD-10-CM | POA: Diagnosis not present

## 2021-04-06 DIAGNOSIS — F0391 Unspecified dementia with behavioral disturbance: Secondary | ICD-10-CM

## 2021-04-06 LAB — BASIC METABOLIC PANEL
Anion gap: 15 (ref 5–15)
BUN: 17 mg/dL (ref 8–23)
CO2: 20 mmol/L — ABNORMAL LOW (ref 22–32)
Calcium: 9.2 mg/dL (ref 8.9–10.3)
Chloride: 104 mmol/L (ref 98–111)
Creatinine, Ser: 0.6 mg/dL (ref 0.44–1.00)
GFR, Estimated: >60 mL/min (ref >60)
Glucose, Bld: 163 mg/dL — ABNORMAL HIGH (ref 70–99)
Potassium: 3.6 mmol/L (ref 3.5–5.1)
Sodium: 139 mmol/L (ref 135–145)

## 2021-04-06 LAB — CK: Total CK: 829 U/L — ABNORMAL HIGH (ref 38–234)

## 2021-04-06 MED ORDER — POTASSIUM CHLORIDE CRYS ER 20 MEQ PO TBCR
40.0000 meq | EXTENDED_RELEASE_TABLET | Freq: Two times a day (BID) | ORAL | Status: AC
  Administered 2021-04-06 (×2): 40 meq via ORAL
  Filled 2021-04-06 (×2): qty 2

## 2021-04-06 MED ORDER — DILTIAZEM HCL 25 MG/5ML IV SOLN
10.0000 mg | Freq: Once | INTRAVENOUS | Status: AC
  Administered 2021-04-06: 10 mg via INTRAVENOUS
  Filled 2021-04-06: qty 5

## 2021-04-06 MED ORDER — METOPROLOL TARTRATE 5 MG/5ML IV SOLN
5.0000 mg | Freq: Once | INTRAVENOUS | Status: AC
  Administered 2021-04-06: 5 mg via INTRAVENOUS
  Filled 2021-04-06: qty 5

## 2021-04-06 NOTE — Progress Notes (Addendum)
PROGRESS NOTE  [name] TDV:[id number] DOB: 1929/10/23 DOA: 04/04/2021 PCP: Binnie Rail, MD  Brief History   87 year old woman with complicated past medical history presented to emergency department from home after multiple falls and being discovered by her son.  Recent history of visual hallucinations, talking to deceased dog, increased confusion, recent severe back pain prior to admission for which outpatient follow-up was planned, admitted for UTI, metabolic encephalopathy, mild rhabdomyolysis, multiple falls. Son requested placement.  A & P  * UTI (urinary tract infection) --no culture obtained --will treat empirically 3 days  Acute metabolic encephalopathy --secondary to UTI, complicated by dementia. CT head negative, no focal deficits. --treat infection  Dementia with behavioral disturbance (HCC) --fall and delirium precautions --continue sertraline  Paroxysmal atrial fibrillation (HCC) --recurrent AVR this AM, now controlled w/ dilt bolus; will continue home Toprol, apixaban  --will need to discuss longterm r/b with family of apixaban, broached w/ son at bedside yesterday  Rhabdomyolysis --mild, secondary to falls at home. CK trending down --IVF, check CK in AM, BMP  Fall at home, initial encounter --multiple at home, no apparent injury --consider r/b of apixaban as outpatient  T12 compression fracture ([place]) --no complicating features noted --pain control, PT consult  Aortic atherosclerosis ([place]) --no inpt treatment indicated  Pulmonary nodule --Recommend follow-up CT in 3 months to ensure resolution  Diabetes mellitus without complication (HCC) --diet-controlled, A1c 6.3 on 10/12/2020.  Disposition Plan:  Discussion: Continue empiric antibiotics.  Heart rate initiated earlier today, monitor.  May need SNF.  Status is: Inpatient  Remains inpatient appropriate because:IV treatments appropriate due to intensity of illness or inability to take PO and  Inpatient level of care appropriate due to severity of illness  Dispo: The patient is from: Home              Anticipated d/c is to:  TBD              Patient currently is not medically stable to d/c.   Difficult to place patient No  DVT prophylaxis: apixaban (ELIQUIS) tablet 2.5 mg Start: 04/05/21 1000 apixaban (ELIQUIS) tablet 2.5 mg    Code Status: DNR Level of care: Progressive Family Communication: Updated son at bedside yesterday evening.  He is a retired Librarian, academic who specializes in geriatrics.    Murray Hodgkins, MD  [facility] Direct contact: see [email or web address] (further directions at bottom of note if needed) 7PM-7AM contact night coverage as at bottom of note 04/06/2021, 6:22 PM  LOS: 2 days   Significant Hospital Events   9/18 admit for confusion, UTI   Consults:  None    Procedures:  None   Significant Diagnostic Tests:  Ceftriaxone 9/18 >    Micro Data:  UC was not ordered   Antimicrobials:    Interval History/Subjective  CC: f/u falls  Confused overnight Rapid HR this early AM, bolused diltiazem with improvement  Feels ok Confused, history limited  Objective   Vitals:  Vitals:   04/06/21 1212 04/06/21 1617  BP: (!) 160/94 (!) 133/106  Pulse: 86 92  Resp: (!) 24 (!) 26  Temp: 98.8 F (37.1 C) 98.7 F (37.1 C)  SpO2: 96% 97%    Exam: Physical Exam Vitals and nursing note reviewed.  Constitutional:      General: She is not in acute distress.    Appearance: Normal appearance. She is ill-appearing. She is not toxic-appearing.  Cardiovascular:     Rate and Rhythm: Tachycardia present. Rhythm irregular.  Heart sounds: No murmur heard.    Comments: Telemetry afib 100s Pulmonary:     Effort: Pulmonary effort is normal. No respiratory distress.     Breath sounds: No wheezing, rhonchi or rales.  Musculoskeletal:     Right lower leg: No edema.     Left lower leg: No edema.  Neurological:     Mental Status: She is alert.  She is disoriented.   I have personally reviewed the labs and other data, making special note of:   Today's Data  BMP noted CK down to 829  Scheduled Meds:  apixaban  2.5 mg Oral BID   metoprolol succinate  50 mg Oral BID   potassium chloride  40 mEq Oral BID   sertraline  50 mg Oral Daily   Continuous Infusions:  sodium chloride 75 mL/hr at 04/06/21 1323   cefTRIAXone (ROCEPHIN)  IV 1 g (04/05/21 2002)    Principal Problem:   UTI (urinary tract infection) Active Problems:   Acute metabolic encephalopathy   Paroxysmal atrial fibrillation (HCC)   Dementia with behavioral disturbance (HCC)   T12 compression fracture (HCC)   Fall at home, initial encounter   Rhabdomyolysis   Essential hypertension   Diabetes mellitus without complication ([facility])   DNR (do not resuscitate)   Pulmonary nodule   Aortic atherosclerosis ([place])   LOS: 2 days   How to contact the [facility] Attending or Consulting provider [place] or covering provider during after hours [place], for this patient?  Check the care team in [facility] and look for a) attending/consulting TR[facility] provider listed and b) the [facility] team listed Log into [email or web address] and use [place]'s universal password to access. If you do not have the password, please contact the hospital operator. Locate the [facility] provider you are looking for under [facility] and page to a number that you can be directly reached. If you still have difficulty reaching the provider, please page the [facility] (Director on Call) for the Hospitalists listed on amion for assistance.

## 2021-04-06 NOTE — Plan of Care (Signed)
Patient not progressing. Still confused.

## 2021-04-06 NOTE — Evaluation (Signed)
Occupational Therapy Evaluation Patient Details Name: [name] MRN: [id number] DOB: 1930/01/17 Today's Date: 04/06/2021   History of Present Illness Pt is an 87 y/o female admitted secondary to multiple falls and confusion. Thought to be secondary to UTI. Also found to have T8 compression fx to be managed conservatively. PMH includes HTN, a fib, DM, osteoporosis, bladder cancer, and chronic thoracic compression fx.   Clinical Impression   Cody was mod I PTA, she is not an accurate historian therefore history and home set up was obtained from her son who is now interested in looking for placement for his mother due to cignitive deficits and multiple falls. Upon arrival pt was soiled in bed and lethargic. She required max cueing throughout session to participate minimally in bed mobility during pericare and linen change. Pt's HR elevated to 130  during all functional tasks and required increased time to recover during bed level Adls. Pt would benefit from continued OT acutely to address the deficits and limitations below. Recommend d/c to SNF at d/c.      Recommendations for follow up therapy are one component of a multi-disciplinary discharge planning process, led by the attending physician.  Recommendations may be updated based on patient status, additional functional criteria and insurance authorization.   Follow Up Recommendations  SNF;Supervision/Assistance - 24 hour    Equipment Recommendations  None recommended by OT       Precautions / Restrictions Precautions Precautions: Fall;Back Precaution Booklet Issued: No Precaution Comments: Watch HR; Multiple falls at home Restrictions Weight Bearing Restrictions: No      Mobility Bed Mobility Overal bed mobility: Needs Assistance Bed Mobility: Rolling Rolling: Max assist         General bed mobility comments: limited to rolling this session due to arousal level; pt requried max A for cuesing and physical cueing to roll L&R  durign pericare and linen change    Transfers Overall transfer level: Needs assistance               General transfer comment: defer this session for safety    Balance Overall balance assessment: Needs assistance                                         ADL either performed or assessed with clinical judgement   ADL Overall ADL's : Needs assistance/impaired Eating/Feeding: Moderate assistance;Bed level   Grooming: Maximal assistance;Bed level   Upper Body Bathing: Maximal assistance;Bed level   Lower Body Bathing: Maximal assistance;Bed level   Upper Body Dressing : Maximal assistance;Bed level   Lower Body Dressing: Maximal assistance;Bed level   Toilet Transfer: Maximal assistance   Toileting- Clothing Manipulation and Hygiene: Maximal assistance       Functional mobility during ADLs: Maximal assistance;Cueing for safety General ADL Comments: pt required max multimodial cues adn physical assist for all functional tasks with limited participation. ADLs completed at bed level for safety; pt extremely lethargic this session     Vision   Vision Assessment?: No apparent visual deficits Additional Comments: difficult to assess due to arousal level     Perception Perception Perception Tested?: No   Praxis      Pertinent Vitals/Pain Pain Assessment: Faces Faces Pain Scale: Hurts even more Pain Location: back Pain Descriptors / Indicators: Grimacing;Guarding Pain Intervention(s): Limited activity within patient's tolerance;Monitored during session     Hand Dominance     Extremity/Trunk  Assessment Upper Extremity Assessment Upper Extremity Assessment: Generalized weakness   Lower Extremity Assessment Lower Extremity Assessment: Defer to PT evaluation   Cervical / Trunk Assessment Cervical / Trunk Assessment: Other exceptions Cervical / Trunk Exceptions: T8 compression fx.   Communication Communication Communication: No difficulties    Cognition Arousal/Alertness: Lethargic;Suspect due to medications Behavior During Therapy: Flat affect Overall Cognitive Status: History of cognitive impairments - at baseline                                 General Comments: Pt oriented to self only; does not follow simple one step commands, requires physical assist and tactile cues for all movement. no initiation of tasks, slow processing   General Comments  Pt's resting HR was 100s, with any functional task (such as rolling, BUE ROM or leg movement) pt's HR elevated to 130s adn recovered to 110s with rest    Exercises     Shoulder Instructions      Home Living Family/patient expects to be discharged to:: Private residence Living Arrangements: Alone Available Help at Discharge: Family;Available PRN/intermittently Type of Home: Apartment Home Access: Stairs to enter Entrance Stairs-Number of Steps: 3-4 steps   Home Layout: One level     Bathroom Shower/Tub: Occupational psychologist: Standard     Home Equipment: Environmental consultant - 2 wheels;Cane - single point          Prior Functioning/Environment Level of Independence: Needs assistance    ADL's / Homemaking Assistance Needed: Reports she has help sometimes, but unable to clarify what kind of help   Comments: Reports she uses cane        OT Problem List: Decreased strength;Decreased range of motion;Decreased activity tolerance;Impaired balance (sitting and/or standing);Decreased cognition;Decreased safety awareness;Decreased knowledge of use of DME or AE;Decreased knowledge of precautions;Pain      OT Treatment/Interventions: Self-care/ADL training;Therapeutic exercise;Therapeutic activities;Patient/family education;Balance training;DME and/or AE instruction    OT Goals(Current goals can be found in the care plan section) Acute Rehab OT Goals Patient Stated Goal: none stated OT Goal Formulation: Patient unable to participate in goal setting Time For  Goal Achievement: 04/20/21 Potential to Achieve Goals: Fair ADL Goals Pt Will Perform Grooming: with min guard assist;sitting Pt Will Perform Upper Body Bathing: with min guard assist;sitting Pt Will Perform Lower Body Bathing: with min assist;sit to/from stand Pt Will Perform Upper Body Dressing: with min guard assist;sitting Pt Will Perform Lower Body Dressing: with min assist;sit to/from stand Pt Will Transfer to Toilet: with min guard assist;ambulating;bedside commode Pt Will Perform Toileting - Clothing Manipulation and hygiene: with min guard assist;sitting/lateral leans  OT Frequency: Min 2X/week   Barriers to D/C: Decreased caregiver support  pt lives alone at baseline       Co-evaluation              AM-PAC OT "6 Clicks" Daily Activity     Outcome Measure Help from another person eating meals?: A Lot Help from another person taking care of personal grooming?: A Lot Help from another person toileting, which includes using toliet, bedpan, or urinal?: A Lot Help from another person bathing (including washing, rinsing, drying)?: A Lot Help from another person to put on and taking off regular upper body clothing?: A Lot Help from another person to put on and taking off regular lower body clothing?: A Lot 6 Click Score: 12   End of Session Nurse Communication: Mobility status (  HR)  Activity Tolerance: Patient limited by lethargy Patient left: in bed;with bed alarm set;with call bell/phone within reach  OT Visit Diagnosis: Unsteadiness on feet (R26.81);Other abnormalities of gait and mobility (R26.89);Muscle weakness (generalized) (M62.81);Other symptoms and signs involving cognitive function;Pain                Time: 8485-9276 OT Time Calculation (min): 19 min Charges:  OT General Charges $OT Visit: 1 Visit OT Evaluation $OT Eval Moderate Complexity: 1 Mod    Keyah Blizard A Jayjay Littles 04/06/2021, 10:03 AM

## 2021-04-07 LAB — URINE CULTURE: Culture: >=100000 — AB

## 2021-04-07 LAB — BASIC METABOLIC PANEL
Anion gap: 11 (ref 5–15)
BUN: 22 mg/dL (ref 8–23)
CO2: 19 mmol/L — ABNORMAL LOW (ref 22–32)
Calcium: 9.2 mg/dL (ref 8.9–10.3)
Chloride: 113 mmol/L — ABNORMAL HIGH (ref 98–111)
Creatinine, Ser: 0.69 mg/dL (ref 0.44–1.00)
GFR, Estimated: >60 mL/min (ref >60)
Glucose, Bld: 154 mg/dL — ABNORMAL HIGH (ref 70–99)
Potassium: 3.7 mmol/L (ref 3.5–5.1)
Sodium: 143 mmol/L (ref 135–145)

## 2021-04-07 LAB — MAGNESIUM: Magnesium: 1.8 mg/dL (ref 1.7–2.4)

## 2021-04-07 LAB — CK: Total CK: 409 U/L — ABNORMAL HIGH (ref 38–234)

## 2021-04-07 MED ORDER — DILTIAZEM HCL 25 MG/5ML IV SOLN
10.0000 mg | Freq: Once | INTRAVENOUS | Status: AC
  Administered 2021-04-07: 10 mg via INTRAVENOUS
  Filled 2021-04-07: qty 5

## 2021-04-07 MED ORDER — DILTIAZEM HCL 30 MG PO TABS
30.0000 mg | ORAL_TABLET | Freq: Three times a day (TID) | ORAL | Status: DC
  Administered 2021-04-07: 30 mg via ORAL
  Filled 2021-04-07: qty 1

## 2021-04-07 MED ORDER — METOPROLOL TARTRATE 5 MG/5ML IV SOLN: 5.0000 mg | Freq: Once | INTRAVENOUS | Status: DC

## 2021-04-07 MED ORDER — POTASSIUM CHLORIDE CRYS ER 20 MEQ PO TBCR
20.0000 meq | EXTENDED_RELEASE_TABLET | Freq: Once | ORAL | Status: AC
  Administered 2021-04-07: 20 meq via ORAL
  Filled 2021-04-07: qty 1

## 2021-04-07 MED ORDER — DILTIAZEM HCL 30 MG PO TABS
30.0000 mg | ORAL_TABLET | Freq: Four times a day (QID) | ORAL | Status: DC
  Administered 2021-04-07 – 2021-04-10 (×12): 30 mg via ORAL
  Filled 2021-04-07 (×13): qty 1

## 2021-04-07 MED ORDER — SODIUM CHLORIDE 0.9 % IV BOLUS
500.0000 mL | Freq: Once | INTRAVENOUS | Status: AC
  Administered 2021-04-07: 500 mL via INTRAVENOUS

## 2021-04-07 NOTE — Care Management Important Message (Signed)
Important Message  Patient Details  Name: [name] MRN: [id number] Date of Birth: January 13, 1930   Medicare Important Message Given:  Yes     [name] 04/07/2021, 11:40 AM

## 2021-04-07 NOTE — Progress Notes (Signed)
   04/07/21 0221  Provider Notification  Provider Name/Title Dr Hal Hope  Date Provider Notified 04/07/21  Time Provider Notified 0220  Notification Type Page  Notification Reason Other (Comment) (Pt;s HR going up to the 130s)  Provider response See new orders  Date of Provider Response 04/07/21  Time of Provider Response 0222

## 2021-04-07 NOTE — NC FL2 (Signed)
[place] LEVEL OF CARE SCREENING TOOL     IDENTIFICATION  Patient Name: [name] Birthdate: [birth year] Sex: female Admission Date (Current Location): 04/04/2021  [facility] and [place] Number:  Herbalist and Address:  [facility], [place] [address], [place], [place] [address]      Provider Number: [id number]  Attending Physician Name and Address:  Elmarie Shiley, MD  Relative Name and Phone Number:       Current Level of Care: Hospital Recommended Level of Care: [place] Prior Approval Number:    Date Approved/Denied:   PASRR Number: [id number]  Discharge Plan: SNF    Current Diagnoses: Patient Active Problem List   Diagnosis Date Noted   Dementia with behavioral disturbance ([place]) 04/05/2021   Fall at home, initial encounter 04/05/2021   Rhabdomyolysis 04/05/2021   Pulmonary nodule 04/05/2021   Aortic atherosclerosis ([place]) 04/05/2021   UTI (urinary tract infection) 05/39/7673   Acute metabolic encephalopathy 41/93/7902   Chronic back pain 10/11/2020   Anxiety and depression 04/14/2020   Pseudophakia 03/05/2020   Advanced nonexudative age-related macular degeneration of both eyes with subfoveal involvement 03/05/2020   Jaw pain 02/14/2020   B12 deficiency 01/01/2020   Tinea corporis 09/09/2019   Hypomagnesemia 07/01/2019   Hypophosphatemia 07/01/2019   DNR (do not resuscitate) 06/19/2019   Syncope 06/17/2019   Memory difficulties 12/21/2018   Acute cystitis without hematuria 12/21/2018   T12 compression fracture ([place]) 04/24/2018   Femur fracture, right ([place]) 01/14/2018   Macular degeneration (senile) of retina 06/14/2010   Vitamin D deficiency 03/10/2009   Osteoporosis 03/10/2009   NEOPLASM, MALIGNANT, BLADDER, HX OF 03/10/2009   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   Diabetes mellitus without complication ([place]) 40/97/3532   Essential hypertension 08/09/2007   Paroxysmal atrial fibrillation  ([place]) 08/09/2007    Orientation RESPIRATION BLADDER Height & Weight     Self  Normal Incontinent Weight: 110 lb 14.3 oz (50.3 kg) Height:     BEHAVIORAL SYMPTOMS/MOOD NEUROLOGICAL BOWEL NUTRITION STATUS      Incontinent Diet (heart healthy/carb modified)  AMBULATORY STATUS COMMUNICATION OF NEEDS Skin   Extensive Assist Verbally Normal                       Personal Care Assistance Level of Assistance  Bathing, Feeding, Dressing Bathing Assistance: Maximum assistance Feeding assistance: Limited assistance Dressing Assistance: Maximum assistance     Functional Limitations Info             SPECIAL CARE FACTORS FREQUENCY  PT (By licensed PT), OT (By licensed OT)     PT Frequency: 5x/wk OT Frequency: 5x/wk            Contractures Contractures Info: Not present    Additional Factors Info  Code Status, Allergies, Psychotropic Code Status Info: DNR Allergies Info: NKA Psychotropic Info: Zoloft 50mg  daily         Current Medications (04/07/2021):  This is the current hospital active medication list Current Facility-Administered Medications  Medication Dose Route Frequency Provider Last Rate Last Admin   0.9 %  sodium chloride infusion   Intravenous Continuous Samuella Cota, MD 75 mL/hr at 04/07/21 0235 New Bag at 04/07/21 0235   acetaminophen (TYLENOL) tablet 650 mg  650 mg Oral Q6H PRN Shela Leff, MD   650 mg at 04/05/21 0114   Or   acetaminophen (TYLENOL) suppository 650 mg  650 mg Rectal Q6H PRN Shela Leff, MD  apixaban (ELIQUIS) tablet 2.5 mg  2.5 mg Oral BID Samuella Cota, MD   2.5 mg at 04/06/21 2209   cefTRIAXone (ROCEPHIN) 1 g in sodium chloride 0.9 % 100 mL IVPB  1 g Intravenous Q24H Shela Leff, MD 200 mL/hr at 04/06/21 2022 1 g at 04/06/21 2022   diltiazem (CARDIZEM) tablet 30 mg  30 mg Oral Q8H Regalado, Belkys A, MD   30 mg at 04/07/21 0853   metoprolol succinate (TOPROL-XL) 24 hr tablet 50 mg  50 mg Oral BID  Samuella Cota, MD   50 mg at 04/06/21 2206   sertraline (ZOLOFT) tablet 50 mg  50 mg Oral Daily Samuella Cota, MD   50 mg at 04/06/21 1010     Discharge Medications: Please see discharge summary for a list of discharge medications.  Relevant Imaging Results:  Relevant Lab Results:   Additional Information SS#: [id number]  Geralynn Ochs, LCSW

## 2021-04-07 NOTE — Progress Notes (Signed)
PROGRESS NOTE    [name]  IRC:[id number] DOB: 03/19/1930 DOA: 04/04/2021 PCP: Binnie Rail, MD   Brief Narrative: 87 year old with past medical history for paroxysmal A. fib, diabetes, dementia with behavioral disturbance presented emergency department from home after multiple falls and being discovered by her son.  Recent history of basal hallucination, talking to deceased dog, increased confusion, recent severe back pain prior to admission for which outpatient follow-up was planted.  Admitted for UTI, metabolic encephalopathy, mild rhabdomyolysis, multiple falls.  Son requested placement.   Assessment & Plan:   Principal Problem:   UTI (urinary tract infection) Active Problems:   Essential hypertension   Paroxysmal atrial fibrillation (HCC)   Diabetes mellitus without complication (HCC)   P10 compression fracture (HCC)   DNR (do not resuscitate)   Acute metabolic encephalopathy   Dementia with behavioral disturbance ([place])   Fall at home, initial encounter   Rhabdomyolysis   Pulmonary nodule   Aortic atherosclerosis ([place])  1-UTI: Urine Culture from 9/18 growing E. coli pansensitive. Continue IV ceftriaxone Febrile today.  She will receive Tylenol. Urine retention, had I and O. Monitor urine out put.   Acute metabolic encephalopathy: Secondary to UTI and complicated by dementia.  CT head negative, no focal deficit. Treating  infection.  Dementia With behavioral disturbance: Continue with sertraline Delirium Precaution.  Paroxysmal A. fib: Patient continues to have RVR.  Received IV Cardizem overnight. Treat Fevers.  IV bolus.  Start oral Cardizem every 6 hours.  Rhabdomyolysis: Mild.  Secondary to falls at home.  CK trending down. With IV fluids.  Fall at home, initial encounter: Multiples at home.  No apparent injury. Needs further discussion in regards continuation of apixaban in the setting of recurrent fall  T12 compression fracture: Pain control.  PT  following  Pulmonary nodule: CT follow-up in 3 months DM without complication: Hemoglobin A1c 6.3 on 10/12/2020.    Estimated body mass index is 19.64 kg/m as calculated from the following:   Height as of 10/12/20: 5\' 3"  (1.6 m).   Weight as of this encounter: 50.3 kg.   DVT prophylaxis: On Eliquis Code Status: DNR Family Communication: Will update son when he arrive Disposition Plan:  Status is: Inpatient  Remains inpatient appropriate because:IV treatments appropriate due to intensity of illness or inability to take PO  Dispo: The patient is from: Home              Anticipated d/c is to: SNF              Patient currently is not medically stable to d/c.   Difficult to place patient No        Consultants:  None  Procedures:  None  Antimicrobials:    Subjective: She gets agitated at times, and her HR increases with agitation.  She is confuse.   Objective: Vitals:   04/07/21 0017 04/07/21 0221 04/07/21 0416 04/07/21 0547  BP: (!) 155/90 (!) 145/92 (!) 144/82   Pulse: (!) 126 (!) 124 93   Resp: (!) 21 20 (!) 25   Temp: 98.6 F (37 C)  98.7 F (37.1 C)   TempSrc: Oral  Oral   SpO2: 97% 96% 94%   Weight:    50.3 kg    Intake/Output Summary (Last 24 hours) at 04/07/2021 0744 Last data filed at 04/07/2021 0417 Gross per 24 hour  Intake 2203.16 ml  Output 775 ml  Net 1428.16 ml   Filed Weights   04/07/21 0547  Weight: 50.3  kg    Examination:  General exam: Agitated at times.  Respiratory system: Clear to auscultation. Respiratory effort normal. Cardiovascular system: S1 & S2 heard, IR tachycardic.  Gastrointestinal system: Abdomen is nondistended, soft and nontender. No organomegaly or masses felt. Normal bowel sounds heard. Central nervous system: Alert confuse, follows command.  Extremities: no edema   Data Reviewed: I have personally reviewed following labs and imaging studies  CBC: Recent Labs  Lab 04/04/21 1343 04/05/21 0411  WBC 12.3*  11.6*  NEUTROABS 9.8*  --   HGB 12.8 12.5  HCT 38.9 38.1  MCV 97.0 99.0  PLT 252 628   Basic Metabolic Panel: Recent Labs  Lab 04/04/21 1343 04/05/21 0411 04/06/21 0322  NA 137 137 139  K 3.4* 4.4 3.6  CL 101 104 104  CO2 26 19* 20*  GLUCOSE 136* 152* 163*  BUN 26* 26* 17  CREATININE 0.76 0.70 0.60  CALCIUM 9.9 9.3 9.2  MG  --  1.8  --    GFR: Estimated Creatinine Clearance: 37.9 mL/min (by C-G formula based on SCr of 0.6 mg/dL). Liver Function Tests: Recent Labs  Lab 04/04/21 1343  AST 37  ALT 22  ALKPHOS 91  BILITOT 1.1  PROT 7.1  ALBUMIN 3.5   No results for input(s): LIPASE, AMYLASE in the last 168 hours. No results for input(s): AMMONIA in the last 168 hours. Coagulation Profile: Recent Labs  Lab 04/04/21 1540  INR 1.1   Cardiac Enzymes: Recent Labs  Lab 04/04/21 1540 04/05/21 0411 04/06/21 0322  CKTOTAL 1,506* 1,624* 829*   BNP (last 3 results) No results for input(s): PROBNP in the last 8760 hours. HbA1C: No results for input(s): HGBA1C in the last 72 hours. CBG: No results for input(s): GLUCAP in the last 168 hours. Lipid Profile: No results for input(s): CHOL, HDL, LDLCALC, TRIG, CHOLHDL, LDLDIRECT in the last 72 hours. Thyroid Function Tests: Recent Labs    04/04/21 1926  TSH 0.652   Anemia Panel: No results for input(s): VITAMINB12, FOLATE, FERRITIN, TIBC, IRON, RETICCTPCT in the last 72 hours. Sepsis Labs: No results for input(s): PROCALCITON, LATICACIDVEN in the last 168 hours.  Recent Results (from the past 240 hour(s))  Urine Culture     Status: Abnormal   Collection Time: 04/04/21  1:19 PM   Specimen: Urine, Clean Catch  Result Value Ref Range Status   Specimen Description URINE, CLEAN CATCH  Final   Special Requests   Final    NONE Performed at [facility], [address] [place], [place] [address]    Culture >=100,000 COLONIES/mL ESCHERICHIA COLI (A)  Final   Report Status 04/07/2021 FINAL  Final   Organism  ID, Bacteria ESCHERICHIA COLI (A)  Final      Susceptibility   Escherichia coli - MIC*    AMPICILLIN <=2 SENSITIVE Sensitive     CEFAZOLIN <=4 SENSITIVE Sensitive     CEFEPIME <=0.12 SENSITIVE Sensitive     CEFTRIAXONE <=0.25 SENSITIVE Sensitive     CIPROFLOXACIN <=0.25 SENSITIVE Sensitive     GENTAMICIN <=1 SENSITIVE Sensitive     IMIPENEM <=0.25 SENSITIVE Sensitive     NITROFURANTOIN <=16 SENSITIVE Sensitive     TRIMETH/SULFA <=20 SENSITIVE Sensitive     AMPICILLIN/SULBACTAM <=2 SENSITIVE Sensitive     PIP/TAZO <=4 SENSITIVE Sensitive     * >=100,000 COLONIES/mL ESCHERICHIA COLI  Resp Panel by RT-PCR (Flu A&B, Covid) Nasopharyngeal Swab     Status: None   Collection Time: 04/04/21  3:40 PM  Specimen: Nasopharyngeal Swab; Nasopharyngeal(NP) swabs in vial transport medium  Result Value Ref Range Status   SARS Coronavirus 2 by RT PCR NEGATIVE NEGATIVE Final    Comment: (NOTE) SARS-CoV-2 target nucleic acids are NOT DETECTED.  The SARS-CoV-2 RNA is generally detectable in upper respiratory specimens during the acute phase of infection. The lowest concentration of SARS-CoV-2 viral copies this assay can detect is 138 copies/mL. A negative result does not preclude SARS-Cov-2 infection and should not be used as the sole basis for treatment or other patient management decisions. A negative result may occur with  improper specimen collection/handling, submission of specimen other than nasopharyngeal swab, presence of viral mutation(s) within the areas targeted by this assay, and inadequate number of viral copies(<138 copies/mL). A negative result must be combined with clinical observations, patient history, and epidemiological information. The expected result is Negative.  Fact Sheet for Patients:  EntrepreneurPulse.com.au  Fact Sheet for Healthcare Providers:  IncredibleEmployment.be  This test is no t yet approved or cleared by the [place]  FDA and  has been authorized for detection and/or diagnosis of SARS-CoV-2 by FDA under an Emergency Use Authorization (EUA). This EUA will remain  in effect (meaning this test can be used) for the duration of the COVID-19 declaration under Section 564(b)(1) of the Act, 21 U.S.C.section 360bbb-3(b)(1), unless the authorization is terminated  or revoked sooner.       Influenza A by PCR NEGATIVE NEGATIVE Final   Influenza B by PCR NEGATIVE NEGATIVE Final    Comment: (NOTE) The Xpert Xpress SARS-CoV-2/FLU/RSV plus assay is intended as an aid in the diagnosis of influenza from Nasopharyngeal swab specimens and should not be used as a sole basis for treatment. Nasal washings and aspirates are unacceptable for Xpert Xpress SARS-CoV-2/FLU/RSV testing.  Fact Sheet for Patients: EntrepreneurPulse.com.au  Fact Sheet for Healthcare Providers: IncredibleEmployment.be  This test is not yet approved or cleared by the [place] FDA and has been authorized for detection and/or diagnosis of SARS-CoV-2 by FDA under an Emergency Use Authorization (EUA). This EUA will remain in effect (meaning this test can be used) for the duration of the COVID-19 declaration under Section 564(b)(1) of the Act, 21 U.S.C. section 360bbb-3(b)(1), unless the authorization is terminated or revoked.  Performed at [facility], [place] [address], [place], [place] [address]          Radiology Studies: CT CHEST WO CONTRAST  Result Date: 04/05/2021 CLINICAL DATA:  Lung nodule follow-up EXAM: CT CHEST WITHOUT CONTRAST TECHNIQUE: Multidetector CT imaging of the chest was performed following the standard protocol without IV contrast. COMPARISON:  CT spine dated April 04, 2021 FINDINGS: Cardiovascular: Cardiomegaly. Small pericardial effusion. Severe atherosclerotic disease of the thoracic aorta three-vessel coronary artery calcifications. Mediastinum/Nodes: Patulous  esophagus. No pathologically enlarged lymph nodes seen in the chest. Lungs/Pleura: Central airways are patent small right pleural effusion. Biapical pleural-parenchymal scarring. Evaluation of the lung parenchyma is limited due to respiratory motion artifact. Mild bilateral ground-glass opacities may be due to atelectasis due to expiratory phase of imaging, although evaluation is limited. Nodular opacity of the left lower lobe measuring 1.1 x 0.4 cm on series 15, image 224. No evidence of pneumothorax. Upper Abdomen: No acute abnormality. Musculoskeletal: Compression fractures of the thoracic spine, see recent CT of the thoracic spine for more detailed discussion. IMPRESSION: Nodular opacity in the left lower lobe measuring up to 1.0 cm, possibly due to focal atelectasis, although a pulmonary nodule not be excluded, respiratory motion artifact somewhat limits  evaluation. Recommend follow-up CT in 3 months to ensure resolution. This recommendation follows the consensus statement: Guidelines for Management of Incidental Pulmonary Nodules Detected on CT Images: From the Fleischner Society 2017; Radiology 2017; 284:228-243. Cardiomegaly and small pericardial effusion. Three-vessel coronary artery calcifications and aortic Atherosclerosis (ICD10-I70.0). Electronically Signed   By: Yetta Glassman M.D.   On: 04/05/2021 09:48        Scheduled Meds:  apixaban  2.5 mg Oral BID   diltiazem  30 mg Oral Q8H   metoprolol succinate  50 mg Oral BID   sertraline  50 mg Oral Daily   Continuous Infusions:  sodium chloride 75 mL/hr at 04/07/21 0235   cefTRIAXone (ROCEPHIN)  IV 1 g (04/06/21 2022)   sodium chloride       LOS: 3 days    Time spent: 35 minutes.     Elmarie Shiley, MD [facility]   If 7PM-7AM, please contact night-coverage [email or web address]  04/07/2021, 7:44 AM

## 2021-04-07 NOTE — Plan of Care (Signed)
  Problem: Education: Goal: Knowledge of General Education information will improve Description: Including pain rating scale, medication(s)/side effects and non-pharmacologic comfort measures Outcome: Progressing   Problem: Health Behavior/Discharge Planning: Goal: Ability to manage health-related needs will improve Outcome: Progressing   Problem: Clinical Measurements: Goal: Ability to maintain clinical measurements within normal limits will improve Outcome: Progressing Goal: Will remain free from infection Outcome: Progressing Goal: Respiratory complications will improve Outcome: Progressing   Problem: Activity: Goal: Risk for activity intolerance will decrease Outcome: Progressing   Problem: Nutrition: Goal: Adequate nutrition will be maintained Outcome: Progressing   Problem: Coping: Goal: Level of anxiety will decrease Outcome: Progressing   Problem: Pain Managment: Goal: General experience of comfort will improve Outcome: Progressing   Problem: Safety: Goal: Ability to remain free from injury will improve Outcome: Progressing   Problem: Skin Integrity: Goal: Risk for impaired skin integrity will decrease Outcome: Progressing

## 2021-04-07 NOTE — TOC Initial Note (Signed)
Transition of Care [facility]) - Initial/Assessment Note    Patient Details  Name: [name] MRN: [id number] Date of Birth: [birth year]  Transition of [facility]) CM/SW Contact:    Geralynn Ochs, LCSW Phone Number: 04/07/2021, 10:29 AM  Clinical Narrative:       CSW spoke with patient's son, [name], about SNF recommendation. [name] in agreement to SNF, but also concerned about what the patient was like when he was here to see her yesterday, wondering if he needs to be considering hospice instead. CSW sent MD a message to discuss with the son. CSW has faxed out referral for SNF and can follow up with the son with bed offers if appropriate.             Expected Discharge Plan: Skilled Nursing Facility Barriers to Discharge: Continued Medical Work up, Ship broker   Patient Goals and CMS Choice Patient states their goals for this hospitalization and ongoing recovery are:: patient unable to participate in goal setting, only oriented to self CMS Medicare.gov Compare Post Acute Care list provided to:: Patient Represenative (must comment) Choice offered to / list presented to : Adult Children  Expected Discharge Plan and Services Expected Discharge Plan: [place] Acute Care Choice: [place] Living arrangements for the past 2 months: Single Family Home                                      Prior Living Arrangements/Services Living arrangements for the past 2 months: Single Family Home Lives with:: Self Patient language and need for interpreter reviewed:: No Do you feel safe going back to the place where you live?: Yes      Need for Family Participation in Patient Care: Yes (Comment) Care giver support system in place?: No (comment)   Criminal Activity/Legal Involvement Pertinent to Current Situation/Hospitalization: No - Comment as needed  Activities of Daily Living Home Assistive Devices/Equipment: None ADL Screening  (condition at time of admission) Patient's cognitive ability adequate to safely complete daily activities?: Yes Is the patient deaf or have difficulty hearing?: No Does the patient have difficulty seeing, even when wearing glasses/contacts?: No Does the patient have difficulty concentrating, remembering, or making decisions?: No Patient able to express need for assistance with ADLs?: Yes Does the patient have difficulty dressing or bathing?: No Independently performs ADLs?: Yes (appropriate for developmental age) Does the patient have difficulty walking or climbing stairs?: Yes Weakness of Legs: Both Weakness of Arms/Hands: None  Permission Sought/Granted Permission sought to share information with : Facility Sport and exercise psychologist, Family Supports Permission granted to share information with : Yes, Verbal Permission Granted  Share Information with NAME: [name]  Permission granted to share info w AGENCY: SNF  Permission granted to share info w Relationship: Son     Emotional Assessment   Attitude/Demeanor/Rapport: Unable to Assess Affect (typically observed): Unable to Assess Orientation: : Oriented to Self Alcohol / Substance Use: Not Applicable Psych Involvement: No (comment)  Admission diagnosis:  Visual hallucination [R44.1] UTI (urinary tract infection) [N39.0] Fall, initial encounter [W19.XXXA] Traumatic injury of head, initial encounter [S09.90XA] Urinary tract infection without hematuria, site unspecified [N39.0] Altered mental status, unspecified altered mental status type [X73.53] Acute metabolic encephalopathy [G99.24] Patient Active Problem List   Diagnosis Date Noted   Dementia with behavioral disturbance ([place]) 04/05/2021   Fall at home, initial encounter 04/05/2021   Rhabdomyolysis  04/05/2021   Pulmonary nodule 04/05/2021   Aortic atherosclerosis ([place]) 04/05/2021   UTI (urinary tract infection) 55/97/4163   Acute metabolic encephalopathy 84/53/6468   Chronic  back pain 10/11/2020   Anxiety and depression 04/14/2020   Pseudophakia 03/05/2020   Advanced nonexudative age-related macular degeneration of both eyes with subfoveal involvement 03/05/2020   Jaw pain 02/14/2020   B12 deficiency 01/01/2020   Tinea corporis 09/09/2019   Hypomagnesemia 07/01/2019   Hypophosphatemia 07/01/2019   DNR (do not resuscitate) 06/19/2019   Syncope 06/17/2019   Memory difficulties 12/21/2018   Acute cystitis without hematuria 12/21/2018   T12 compression fracture ([place]) 04/24/2018   Femur fracture, right ([place]) 01/14/2018   Macular degeneration (senile) of retina 06/14/2010   Vitamin D deficiency 03/10/2009   Osteoporosis 03/10/2009   NEOPLASM, MALIGNANT, BLADDER, HX OF 03/10/2009   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   Diabetes mellitus without complication ([place]) 10/06/2246   Essential hypertension 08/09/2007   Paroxysmal atrial fibrillation ([place]) 08/09/2007   PCP:  Binnie Rail, MD Pharmacy:   [id number] [address], [place] [address] Phone: [phone] Fax: [phone]     Social Determinants of Health (SDOH) Interventions    Readmission Risk Interventions No flowsheet data found.

## 2021-04-08 DIAGNOSIS — F0391 Unspecified dementia with behavioral disturbance: Secondary | ICD-10-CM | POA: Diagnosis not present

## 2021-04-08 DIAGNOSIS — Z66 Do not resuscitate: Secondary | ICD-10-CM

## 2021-04-08 DIAGNOSIS — R4182 Altered mental status, unspecified: Secondary | ICD-10-CM

## 2021-04-08 DIAGNOSIS — Z515 Encounter for palliative care: Secondary | ICD-10-CM

## 2021-04-08 DIAGNOSIS — N390 Urinary tract infection, site not specified: Secondary | ICD-10-CM | POA: Diagnosis not present

## 2021-04-08 DIAGNOSIS — G9341 Metabolic encephalopathy: Secondary | ICD-10-CM | POA: Diagnosis not present

## 2021-04-08 LAB — BASIC METABOLIC PANEL
Anion gap: 8 (ref 5–15)
BUN: 26 mg/dL — ABNORMAL HIGH (ref 8–23)
CO2: 20 mmol/L — ABNORMAL LOW (ref 22–32)
Calcium: 8.9 mg/dL (ref 8.9–10.3)
Chloride: 115 mmol/L — ABNORMAL HIGH (ref 98–111)
Creatinine, Ser: 0.67 mg/dL (ref 0.44–1.00)
GFR, Estimated: >60 mL/min (ref >60)
Glucose, Bld: 124 mg/dL — ABNORMAL HIGH (ref 70–99)
Potassium: 3.8 mmol/L (ref 3.5–5.1)
Sodium: 143 mmol/L (ref 135–145)

## 2021-04-08 MED ORDER — TAMSULOSIN HCL 0.4 MG PO CAPS
0.4000 mg | ORAL_CAPSULE | Freq: Every day | ORAL | Status: DC
  Administered 2021-04-08 – 2021-04-14 (×7): 0.4 mg via ORAL
  Filled 2021-04-08 (×7): qty 1

## 2021-04-08 MED ORDER — LIP MEDEX EX OINT
1.0000 "application " | TOPICAL_OINTMENT | CUTANEOUS | Status: DC | PRN
  Filled 2021-04-08: qty 7

## 2021-04-08 NOTE — Progress Notes (Signed)
Physical Therapy Treatment Patient Details Name: [name] MRN: [id number] DOB: Oct 21, 1929 Today's Date: 04/08/2021   History of Present Illness Pt is an 87 y/o female admitted secondary to multiple falls and confusion. Thought to be secondary to UTI. Also found to have T8 compression fx to be managed conservatively. PMH includes HTN, a fib, DM, osteoporosis, bladder cancer, and chronic thoracic compression fx.    PT Comments    Pt received in supine, A&O x1 and with good participation and fair tolerance for bed mobility, seated balance task and transfer training. Pt needing +2 max to totalA for bed mobility, sit<>stand and static standing, limited due to posterior lean in seated/standing postures and L sided back pain and unable to progress OOB mobility/gait this date. Pt HR max 114 bpm with exertion, VSS otherwise on RA. Emphasis on supine/seated BLE strengthening exercises and midline seated posture. Pt continues to benefit from PT services to progress toward functional mobility goals.   Recommendations for follow up therapy are one component of a multi-disciplinary discharge planning process, led by the attending physician.  Recommendations may be updated based on patient status, additional functional criteria and insurance authorization.  Follow Up Recommendations  SNF;Supervision/Assistance - 24 hour     Equipment Recommendations  None recommended by PT    Recommendations for Other Services       Precautions / Restrictions Precautions Precautions: Fall;Back Precaution Booklet Issued: No Precaution Comments: Watch HR; Multiple falls at home Restrictions Weight Bearing Restrictions: No     Mobility  Bed Mobility Overal bed mobility: Needs Assistance Bed Mobility: Rolling;Sidelying to Sit;Sit to Sidelying Rolling: Mod assist Sidelying to sit: Max assist;+2 for safety/equipment     Sit to sidelying: Max assist;+2 for safety/equipment;+2 for physical assistance General  bed mobility comments: Pt requiring mod A to roll and max for sidelying to sit and to return to supine. Requiring verbal cueing for sequencing and initiation and +2 for safety due to cognition/poor technique    Transfers Overall transfer level: Needs assistance Equipment used: 2 person hand held assist Transfers: Sit to/from Stand Sit to Stand: Max assist;+2 physical assistance;+2 safety/equipment         General transfer comment: Pt required +2 handheld assist as well as therapist assisting with lifting her hips using the bed pad. Pt leaning posteriorly and unable to correct with verbal cues, B feet blocked for safety.  Ambulation/Gait             General Gait Details: pt unable due to cognition/posterior lean   Stairs             Wheelchair Mobility    Modified Rankin (Stroke Patients Only)       Balance Overall balance assessment: Needs assistance Sitting-balance support: Bilateral upper extremity supported;Feet supported Sitting balance-Leahy Scale: Poor Sitting balance - Comments: Reliant on UE and support posteriorly at times due to posterior lean Postural control: Posterior lean;Left lateral lean Standing balance support: Bilateral upper extremity supported Standing balance-Leahy Scale: Zero Standing balance comment: Pt unable to maintain standing, leaning backwards                            Cognition Arousal/Alertness: Awake/alert Behavior During Therapy: WFL for tasks assessed/performed Overall Cognitive Status: History of cognitive impairments - at baseline  General Comments: Pt oriented to self, believes that she was visiting someone in the hospital with her son, not that she is in the hospital. Reoriented x2 but remains unaware she is an inpatient.      Exercises Other Exercises Other Exercises: seated BLE AAROM: LAQ x10 reps Other Exercises: lateral leans with elbow taps x10 Other  Exercises: Reaching across midline BUE x5 Other Exercises: supine BLE AAROM: ankle pumps, heel slides x10 reps ea Other Exercises: STS x1 for BLE strengthening    General Comments General comments (skin integrity, edema, etc.): HR up to 114 bpm with exertion; 80-90's bpm resting      Pertinent Vitals/Pain Pain Assessment: Faces Faces Pain Scale: Hurts a little bit Pain Location: back Pain Descriptors / Indicators: Grimacing;Guarding Pain Intervention(s): Limited activity within patient's tolerance;Monitored during session;Repositioned    Home Living                      Prior Function            PT Goals (current goals can now be found in the care plan section) Acute Rehab PT Goals Patient Stated Goal: none stated PT Goal Formulation: With patient Time For Goal Achievement: 04/19/21 Potential to Achieve Goals: Good Progress towards PT goals: Progressing toward goals    Frequency    Min 2X/week      PT Plan Current plan remains appropriate    Co-evaluation PT/OT/SLP Co-Evaluation/Treatment: Yes Reason for Co-Treatment: Complexity of the patient's impairments (multi-system involvement);Necessary to address cognition/behavior during functional activity;For patient/therapist safety;To address functional/ADL transfers PT goals addressed during session: Mobility/safety with mobility;Balance;Strengthening/ROM OT goals addressed during session: ADL's and self-care;Strengthening/ROM      AM-PAC PT "6 Clicks" Mobility   Outcome Measure  Help needed turning from your back to your side while in a flat bed without using bedrails?: A Lot Help needed moving from lying on your back to sitting on the side of a flat bed without using bedrails?: A Lot Help needed moving to and from a bed to a chair (including a wheelchair)?: Total Help needed standing up from a chair using your arms (e.g., wheelchair or bedside chair)?: Total Help needed to walk in hospital room?:  Total Help needed climbing 3-5 steps with a railing? : Total 6 Click Score: 8    End of Session Equipment Utilized During Treatment: Gait belt Activity Tolerance: Patient limited by pain;Patient limited by fatigue Patient left: in bed;with call bell/phone within reach;with bed alarm set;with SCD's reapplied Nurse Communication: Mobility status PT Visit Diagnosis: Unsteadiness on feet (R26.81);Muscle weakness (generalized) (M62.81);Difficulty in walking, not elsewhere classified (R26.2)     Time: 6759-1638 PT Time Calculation (min) (ACUTE ONLY): 24 min  Charges:  $Therapeutic Exercise: 8-22 mins                     Kiala Faraj P., PTA Acute Rehabilitation Services Pager: [phone] Office: [place] 04/08/2021, 6:52 PM

## 2021-04-08 NOTE — Evaluation (Signed)
Clinical/Bedside Swallow Evaluation Patient Details  Name: [name] MRN: [id number] Date of Birth: 1929/10/14  Today's Date: 04/08/2021 Time: SLP Start Time (ACUTE ONLY): 1458 SLP Stop Time (ACUTE ONLY): 1512 SLP Time Calculation (min) (ACUTE ONLY): 14 min  Past Medical History:  Past Medical History:  Diagnosis Date   Anemia    Arthritis    "back" (01/16/2018)   Atrial fibrillation (HCC)    Cancer (HCC)    low grade papillary, most recent recurrence 2011,  Dr Karsten Ro   Cardiac arrhythmia due to congenital heart disease    History of blood transfusion 01/15/2018   S/P OR   Hypertension    Hyponatremia 01/26/12   Na+ 131   Macular degeneration, bilateral    "were wet; dry now" (01/16/2018)   Other abnormal glucose 06/14/12   Fasting blood glucose 128; A1c 5.8% on 12/15/11   Past Surgical History:  Past Surgical History:  Procedure Laterality Date   ABDOMINAL HYSTERECTOMY  1970s   with oopherectomy for incidenctal cyst (no PMH of abnormal PAP)   APPENDECTOMY     1970s   BLADDER TUMOR EXCISION  2011 & 2013   Dr Karsten Ro   CATARACT EXTRACTION, BILATERAL  2014   Dr Herbert Deaner   COLONOSCOPY     negative; Dr Olevia Perches   CYSTOSCOPY      multiple since tumor excision;Dr Belle Rose (IM) NAIL INTERTROCHANTERIC Right 01/15/2018   Procedure: INTRAMEDULLARY (IM) NAIL INTERTROCHANTRIC;  Surgeon: Melrose Nakayama, MD;  Location: [facility];  Service: Orthopedics;  Laterality: Right;   KYPHOPLASTY N/A 03/08/2018   Procedure: THORACIC 12 KYPHOPLASTY;  Surgeon: Phylliss Bob, MD;  Location: [facility];  Service: Orthopedics;  Laterality: N/A;   TONSILLECTOMY     HPI:  Pt is an 87 y/o female admitted secondary to multiple falls and confusion. Thought to be secondary to UTI. Also found to have T8 compression fx to be managed conservatively. CT head (04/04/21) revealed no acute intracranial findings. Chest xray (9/18) revealed nodular  opacity in the right lower lobe not present in  2020. RN reports pt coughing with liquids. BSE requested.  PMH includes HTN, a fib, diabetes, dementia with behavioral disturbance, osteoporosis, bladder cancer and chronic thoracic compression fx.    Assessment / Plan / Recommendation  Clinical Impression  Pt alert and repositioned upright in bed with son present to provide PLOF. At baseline, son reports pt consuming regular diet, thin liquids without difficulty, though he notes occasional coughing with thin liquids. Oral mechanism examination unremarkable. Pt self-fed consecutive sips of thin liquids exhibiting no overt s/sx of aspiration. Mildly prolonged mastication noted with regular textured solids, however pt was able to orally clear completely. Pt's swallow appears functional at bedside. Recommend continue regular, thin liquid diet with adherence to universal swallow precautions. No further SLP f/u warranted.  SLP Visit Diagnosis: Dysphagia, unspecified (R13.10)    Aspiration Risk  Mild aspiration risk    Diet Recommendation Regular;Thin liquid   Liquid Administration via: Straw;Cup Medication Administration: Crushed with puree Supervision: Full supervision/cueing for compensatory strategies;Staff to assist with self feeding Compensations: Minimize environmental distractions;Slow rate;Small sips/bites Postural Changes: Seated upright at 90 degrees    Other  Recommendations Oral Care Recommendations: Oral care BID;Staff/trained caregiver to provide oral care    Recommendations for follow up therapy are one component of a multi-disciplinary discharge planning process, led by the attending physician.  Recommendations may be updated  based on patient status, additional functional criteria and insurance authorization.  Follow up Recommendations None      Frequency and Duration            Prognosis        Swallow Study   General Date of Onset: 04/08/21 HPI: Pt is an 87 y/o female  admitted secondary to multiple falls and confusion. Thought to be secondary to UTI. Also found to have T8 compression fx to be managed conservatively. CT head (04/04/21) revealed no acute intracranial findings. Chest xray (9/18) revealed nodular opacity in the right lower lobe not present in  2020. RN reports pt coughing with liquids. BSE requested.  PMH includes HTN, a fib, diabetes, dementia with behavioral disturbance, osteoporosis, bladder cancer and chronic thoracic compression fx. Type of Study: Bedside Swallow Evaluation Previous Swallow Assessment: none per EMR Diet Prior to this Study: Regular;Thin liquids Temperature Spikes Noted: Yes (100.8) Respiratory Status: Room air History of Recent Intubation: No Behavior/Cognition: Cooperative;Alert;Pleasant mood;Confused Oral Cavity Assessment: Within Functional Limits Oral Care Completed by SLP: No Oral Cavity - Dentition: Adequate natural dentition Vision: Functional for self-feeding Self-Feeding Abilities: Needs assist Patient Positioning: Upright in bed;Postural control adequate for testing Baseline Vocal Quality: Normal Volitional Cough: Weak Volitional Swallow: Able to elicit    Oral/Motor/Sensory Function Overall Oral Motor/Sensory Function: Within functional limits   Ice Chips Ice chips: Not tested   Thin Liquid Thin Liquid: Within functional limits Presentation: Straw;Self Fed    Nectar Thick Nectar Thick Liquid: Not tested   Honey Thick Honey Thick Liquid: Not tested   Puree Puree: Within functional limits Presentation: Spoon   Solid     Solid: Within functional limits Presentation: Vandiver, [place], [place] Office Number: [place] 04/08/2021,3:39 PM

## 2021-04-08 NOTE — Progress Notes (Signed)
Occupational Therapy Treatment Patient Details Name: [name] MRN: [id number] DOB: 08-22-1929 Today's Date: 04/08/2021   History of present illness Pt is an 87 y/o female admitted secondary to multiple falls and confusion. Thought to be secondary to UTI. Also found to have T8 compression fx to be managed conservatively. PMH includes HTN, a fib, DM, osteoporosis, bladder cancer, and chronic thoracic compression fx.   OT comments  Pt making incremental progress with OT goals. This session, pt appears to be more confused than previous therapy sessions, as she had difficulty sequencing simple commands and initiating tasks. Pt requiring mod-max A +2 for all functional mobility and was unable to come to full standing this session. SNF continues to be the best recommendation for rehab once discharged. OT will continue to follow acutely.    Recommendations for follow up therapy are one component of a multi-disciplinary discharge planning process, led by the attending physician.  Recommendations may be updated based on patient status, additional functional criteria and insurance authorization.    Follow Up Recommendations  SNF;Supervision/Assistance - 24 hour    Equipment Recommendations  None recommended by OT    Recommendations for Other Services      Precautions / Restrictions Precautions Precautions: Fall;Back Precaution Booklet Issued: No Precaution Comments: Watch HR; Multiple falls at home Restrictions Weight Bearing Restrictions: No       Mobility Bed Mobility Overal bed mobility: Needs Assistance Bed Mobility: Rolling;Sidelying to Sit;Sit to Sidelying Rolling: Mod assist Sidelying to sit: Max assist     Sit to sidelying: Max assist General bed mobility comments: Pt requiring mod A to roll and max for sidelying to sit and to return to supine. requiring verbal cueing for sequencing and initiation    Transfers Overall transfer level: Needs assistance Equipment used: 2  person hand held assist Transfers: Sit to/from Stand Sit to Stand: Max assist;+2 physical assistance;+2 safety/equipment         General transfer comment: Pt required +2 handheld assist as well as therapist assisting with lifting her hips using the bed pad. Pt leaning posteriorly and unable to correct with verbal cues    Balance Overall balance assessment: Needs assistance Sitting-balance support: Bilateral upper extremity supported;Feet supported Sitting balance-Leahy Scale: Poor Sitting balance - Comments: Reliant on UE and support posteriorly at times due to posterior lean Postural control: Posterior lean Standing balance support: Bilateral upper extremity supported Standing balance-Leahy Scale: Zero Standing balance comment: Pt unable to maintain standing, leaning backwards                           ADL either performed or assessed with clinical judgement   ADL Overall ADL's : Needs assistance/impaired                                       General ADL Comments: Session focused on Exercises, sitting balance, and bed mobility     Vision       Perception     Praxis      Cognition Arousal/Alertness: Awake/alert Behavior During Therapy: WFL for tasks assessed/performed Overall Cognitive Status: History of cognitive impairments - at baseline                                 General Comments: Pt oriented to self, believes that she was  visiting someone inthe hospital with her son, not that she is in the hospital.        Exercises Exercises: Other exercises Other Exercises Other Exercises: reaching forward with BUE x10 Other Exercises: Leaning side to side in sitting x10 Other Exercises: Reaching across midline BUE x5 Other Exercises: Shoulder shrugs x10 Other Exercises: Scapula squeezes x10   Shoulder Instructions       General Comments VSS on RA, highest HR noted was 114 with x2 attempts to stand    Pertinent Vitals/  Pain       Pain Assessment: Faces Faces Pain Scale: Hurts a little bit Pain Location: back Pain Descriptors / Indicators: Grimacing;Guarding Pain Intervention(s): Limited activity within patient's tolerance;Monitored during session;Repositioned  Home Living                                          Prior Functioning/Environment              Frequency  Min 2X/week        Progress Toward Goals  OT Goals(current goals can now be found in the care plan section)  Progress towards OT goals: Progressing toward goals  Acute Rehab OT Goals Patient Stated Goal: none stated OT Goal Formulation: Patient unable to participate in goal setting Time For Goal Achievement: 04/20/21 Potential to Achieve Goals: Fair ADL Goals Pt Will Perform Grooming: with min guard assist;sitting Pt Will Perform Upper Body Bathing: with min guard assist;sitting Pt Will Perform Lower Body Bathing: with min assist;sit to/from stand Pt Will Perform Upper Body Dressing: with min guard assist;sitting Pt Will Perform Lower Body Dressing: with min assist;sit to/from stand Pt Will Transfer to Toilet: with min guard assist;ambulating;bedside commode Pt Will Perform Toileting - Clothing Manipulation and hygiene: with min guard assist;sitting/lateral leans  Plan Discharge plan remains appropriate;Frequency remains appropriate    Co-evaluation    PT/OT/SLP Co-Evaluation/Treatment: Yes Reason for Co-Treatment: Complexity of the patient's impairments (multi-system involvement);Necessary to address cognition/behavior during functional activity;To address functional/ADL transfers;For patient/therapist safety   OT goals addressed during session: ADL's and self-care;Strengthening/ROM      AM-PAC OT "6 Clicks" Daily Activity     Outcome Measure   Help from another person eating meals?: A Lot Help from another person taking care of personal grooming?: A Lot Help from another person toileting, which  includes using toliet, bedpan, or urinal?: A Lot Help from another person bathing (including washing, rinsing, drying)?: A Lot Help from another person to put on and taking off regular upper body clothing?: A Lot Help from another person to put on and taking off regular lower body clothing?: A Lot 6 Click Score: 12    End of Session Equipment Utilized During Treatment: Gait belt  OT Visit Diagnosis: Unsteadiness on feet (R26.81);Other abnormalities of gait and mobility (R26.89);Muscle weakness (generalized) (M62.81);Other symptoms and signs involving cognitive function;Pain   Activity Tolerance Patient tolerated treatment well   Patient Left in bed;with bed alarm set;with call bell/phone within reach   Nurse Communication Mobility status        Time: 0932-3557 OT Time Calculation (min): 24 min  Charges: OT General Charges $OT Visit: 1 Visit OT Treatments $Therapeutic Activity: 8-22 mins  Shontelle Muska H., OTR/L Acute Rehabilitation  Karys Meckley Elane Yolanda Bonine 04/08/2021, 5:53 PM

## 2021-04-08 NOTE — Progress Notes (Signed)
PROGRESS NOTE    [name]  ZOX:[id number] DOB: 1930-05-08 DOA: 04/04/2021 PCP: Binnie Rail, MD   Brief Narrative: 87 year old with past medical history for paroxysmal A. fib, diabetes, dementia with behavioral disturbance presented emergency department from home after multiple falls and being discovered by her son.  Recent history of basal hallucination, talking to deceased dog, increased confusion, recent severe back pain prior to admission for which outpatient follow-up was planted.  Admitted for UTI, metabolic encephalopathy, mild rhabdomyolysis, multiple falls.  Son requested placement.   Assessment & Plan:   Principal Problem:   UTI (urinary tract infection) Active Problems:   Essential hypertension   Paroxysmal atrial fibrillation (HCC)   Diabetes mellitus without complication (HCC)   W11 compression fracture (HCC)   DNR (do not resuscitate)   Acute metabolic encephalopathy   Dementia with behavioral disturbance ([place])   Fall at home, initial encounter   Rhabdomyolysis   Pulmonary nodule   Aortic atherosclerosis ([place])  1-UTI: Urine Culture from 9/18 growing E. coli pansensitive. Continue IV ceftriaxone Urine retention, had I and O. Monitor urine out put.  Required another in and out last night. If persist, might need to placed foley catheter.  Had fever yesterday.   Acute metabolic encephalopathy: Secondary to UTI and complicated by dementia.  CT head negative, no focal deficit. Treating  infection.  Dementia With behavioral disturbance: Continue with sertraline Delirium Precaution.  Paroxysmal A. fib: Patient continues to have RVR.  Received IV Cardizem overnight. Continue with oral Cardizem every 6 hours. HR improved on cardizem Plan to NSL fluids.   Rhabdomyolysis: Mild.  Secondary to falls at home.  CK trending down. Received IV fluids.   Fall at home, initial encounter: Multiples at home.  No apparent injury. Needs further discussion in regards  continuation of apixaban in the setting of recurrent fall  T12 compression fracture: Pain control.  PT following  Pulmonary nodule: CT follow-up in 3 months DM without complication: Hemoglobin A1c 6.3 on 10/12/2020.    Estimated body mass index is 20.07 kg/m as calculated from the following:   Height as of 10/12/20: 5\' 3"  (1.6 m).   Weight as of this encounter: 51.4 kg.   DVT prophylaxis: On Eliquis Code Status: DNR Family Communication: son updated 9/21, plan to consult palliative care for goals of care.  Disposition Plan:  Status is: Inpatient  Remains inpatient appropriate because:IV treatments appropriate due to intensity of illness or inability to take PO  Dispo: The patient is from: Home              Anticipated d/c is to: SNF              Patient currently is not medically stable to d/c.   Difficult to place patient No        Consultants:  None  Procedures:  None  Antimicrobials:    Subjective: She is alert, continue to be confuse. She cough after drinking some water. Will get speech swallow eval.  Son was asking about hospice care, if patient would qualify. Plan to consult palliative care.   Objective: Vitals:   04/08/21 0447 04/08/21 0504 04/08/21 0847 04/08/21 1130  BP: 139/85  (!) 152/91 (!) 148/96  Pulse: 86  83   Resp:   (!) 23 (!) 25  Temp:   98 F (36.7 C) 98.1 F (36.7 C)  TempSrc:   Axillary Axillary  SpO2:   96%   Weight:  51.4 kg  Intake/Output Summary (Last 24 hours) at 04/08/2021 1257 Last data filed at 04/08/2021 1100 Gross per 24 hour  Intake 2026.15 ml  Output 850 ml  Net 1176.15 ml    Filed Weights   04/07/21 0547 04/08/21 0504  Weight: 50.3 kg 51.4 kg    Examination:  General exam: NAD Respiratory system: BL ronchus Cardiovascular system: S 1, S 2 IRR Gastrointestinal system: BS present, soft, nt Central nervous system: alert, follows command Extremities: no edema   Data Reviewed: I have personally reviewed  following labs and imaging studies  CBC: Recent Labs  Lab 04/04/21 1343 04/05/21 0411  WBC 12.3* 11.6*  NEUTROABS 9.8*  --   HGB 12.8 12.5  HCT 38.9 38.1  MCV 97.0 99.0  PLT 252 616    Basic Metabolic Panel: Recent Labs  Lab 04/04/21 1343 04/05/21 0411 04/06/21 0322 04/07/21 0729 04/08/21 0657  NA 137 137 139 143 143  K 3.4* 4.4 3.6 3.7 3.8  CL 101 104 104 113* 115*  CO2 26 19* 20* 19* 20*  GLUCOSE 136* 152* 163* 154* 124*  BUN 26* 26* 17 22 26*  CREATININE 0.76 0.70 0.60 0.69 0.67  CALCIUM 9.9 9.3 9.2 9.2 8.9  MG  --  1.8  --  1.8  --     GFR: Estimated Creatinine Clearance: 38.7 mL/min (by C-G formula based on SCr of 0.67 mg/dL). Liver Function Tests: Recent Labs  Lab 04/04/21 1343  AST 37  ALT 22  ALKPHOS 91  BILITOT 1.1  PROT 7.1  ALBUMIN 3.5    No results for input(s): LIPASE, AMYLASE in the last 168 hours. No results for input(s): AMMONIA in the last 168 hours. Coagulation Profile: Recent Labs  Lab 04/04/21 1540  INR 1.1    Cardiac Enzymes: Recent Labs  Lab 04/04/21 1540 04/05/21 0411 04/06/21 0322 04/07/21 0729  CKTOTAL 1,506* 1,624* 829* 409*    BNP (last 3 results) No results for input(s): PROBNP in the last 8760 hours. HbA1C: No results for input(s): HGBA1C in the last 72 hours. CBG: No results for input(s): GLUCAP in the last 168 hours. Lipid Profile: No results for input(s): CHOL, HDL, LDLCALC, TRIG, CHOLHDL, LDLDIRECT in the last 72 hours. Thyroid Function Tests: No results for input(s): TSH, T4TOTAL, FREET4, T3FREE, THYROIDAB in the last 72 hours.  Anemia Panel: No results for input(s): VITAMINB12, FOLATE, FERRITIN, TIBC, IRON, RETICCTPCT in the last 72 hours. Sepsis Labs: No results for input(s): PROCALCITON, LATICACIDVEN in the last 168 hours.  Recent Results (from the past 240 hour(s))  Urine Culture     Status: Abnormal   Collection Time: 04/04/21  1:19 PM   Specimen: Urine, Clean Catch  Result Value Ref Range  Status   Specimen Description URINE, CLEAN CATCH  Final   Special Requests   Final    NONE Performed at [facility], [address], [place], [place] [address]    Culture >=100,000 COLONIES/mL ESCHERICHIA COLI (A)  Final   Report Status 04/07/2021 FINAL  Final   Organism ID, Bacteria ESCHERICHIA COLI (A)  Final      Susceptibility   Escherichia coli - MIC*    AMPICILLIN <=2 SENSITIVE Sensitive     CEFAZOLIN <=4 SENSITIVE Sensitive     CEFEPIME <=0.12 SENSITIVE Sensitive     CEFTRIAXONE <=0.25 SENSITIVE Sensitive     CIPROFLOXACIN <=0.25 SENSITIVE Sensitive     GENTAMICIN <=1 SENSITIVE Sensitive     IMIPENEM <=0.25 SENSITIVE Sensitive     NITROFURANTOIN <=16  SENSITIVE Sensitive     TRIMETH/SULFA <=20 SENSITIVE Sensitive     AMPICILLIN/SULBACTAM <=2 SENSITIVE Sensitive     PIP/TAZO <=4 SENSITIVE Sensitive     * >=100,000 COLONIES/mL ESCHERICHIA COLI  Resp Panel by RT-PCR (Flu A&B, Covid) Nasopharyngeal Swab     Status: None   Collection Time: 04/04/21  3:40 PM   Specimen: Nasopharyngeal Swab; Nasopharyngeal(NP) swabs in vial transport medium  Result Value Ref Range Status   SARS Coronavirus 2 by RT PCR NEGATIVE NEGATIVE Final    Comment: (NOTE) SARS-CoV-2 target nucleic acids are NOT DETECTED.  The SARS-CoV-2 RNA is generally detectable in upper respiratory specimens during the acute phase of infection. The lowest concentration of SARS-CoV-2 viral copies this assay can detect is 138 copies/mL. A negative result does not preclude SARS-Cov-2 infection and should not be used as the sole basis for treatment or other patient management decisions. A negative result may occur with  improper specimen collection/handling, submission of specimen other than nasopharyngeal swab, presence of viral mutation(s) within the areas targeted by this assay, and inadequate number of viral copies(<138 copies/mL). A negative result must be combined with clinical observations, patient history,  and epidemiological information. The expected result is Negative.  Fact Sheet for Patients:  EntrepreneurPulse.com.au  Fact Sheet for Healthcare Providers:  IncredibleEmployment.be  This test is no t yet approved or cleared by the [place] FDA and  has been authorized for detection and/or diagnosis of SARS-CoV-2 by FDA under an Emergency Use Authorization (EUA). This EUA will remain  in effect (meaning this test can be used) for the duration of the COVID-19 declaration under Section 564(b)(1) of the Act, 21 U.S.C.section 360bbb-3(b)(1), unless the authorization is terminated  or revoked sooner.       Influenza A by PCR NEGATIVE NEGATIVE Final   Influenza B by PCR NEGATIVE NEGATIVE Final    Comment: (NOTE) The Xpert Xpress SARS-CoV-2/FLU/RSV plus assay is intended as an aid in the diagnosis of influenza from Nasopharyngeal swab specimens and should not be used as a sole basis for treatment. Nasal washings and aspirates are unacceptable for Xpert Xpress SARS-CoV-2/FLU/RSV testing.  Fact Sheet for Patients: EntrepreneurPulse.com.au  Fact Sheet for Healthcare Providers: IncredibleEmployment.be  This test is not yet approved or cleared by the [place] FDA and has been authorized for detection and/or diagnosis of SARS-CoV-2 by FDA under an Emergency Use Authorization (EUA). This EUA will remain in effect (meaning this test can be used) for the duration of the COVID-19 declaration under Section 564(b)(1) of the Act, 21 U.S.C. section 360bbb-3(b)(1), unless the authorization is terminated or revoked.  Performed at [facility], [place] [address] [place], [place] [address]           Radiology Studies: No results found.      Scheduled Meds:  apixaban  2.5 mg Oral BID   diltiazem  30 mg Oral Q6H   metoprolol succinate  50 mg Oral BID   sertraline  50 mg Oral Daily   Continuous  Infusions:  sodium chloride 100 mL/hr at 04/08/21 0440   cefTRIAXone (ROCEPHIN)  IV 1 g (04/07/21 1955)     LOS: 4 days    Time spent: 35 minutes.     Elmarie Shiley, MD [facility]   If 7PM-7AM, please contact night-coverage [email or web address]  04/08/2021, 12:57 PM

## 2021-04-08 NOTE — Progress Notes (Signed)
I spoke with the patient at bedside today. She is confused and saying she is traveling with a friend to [place], [place] for a birthday party.  I then spoke with the patient's son [name] on the phone.  He plans on being at the hospital today, 9/22.  We agreed to meet at bedside at 3 PM to discuss his mother and her options for care moving forward.  Terrell Ilsa Iha, FNP-BC Palliative Medicine Team Team Phone # [phone]  NO CHARGE

## 2021-04-08 NOTE — Consult Note (Signed)
                                                                                 Consultation Note Date: 04/08/2021   Patient Name: [name]  DOB: 02/04/1930  MRN: [id number]  Age / Sex: 87 y.o., female  PCP: Burns, Stacy J, MD Referring Physician: Regalado, Belkys A, MD  Reason for Consultation: Establishing goals of care  HPI/Patient Profile: 87 y.o. female  with past medical history of paroxysmal AFib, DMII, dementia with behavioral disturbances, bilateral macular degeneration, B!2 deficiency, Vit D deficiency, osteoporosis, chronic back pain with T12 compression fracture, bladder cancer and multiple falls at home admitted on 04/04/2021 with UTI, metabolic encephelopathy, and mild rhabdomyolysis after being found down by son.   Current plan is for d/c to SNF.   PMT consulted to discuss with son if Hospice should be considered for patient at this time.  Clinical Assessment and Goals of Care: I have reviewed medical records including EPIC notes, labs and imaging, received report from bedside RN, assessed the patient and then met with patient and patient's son [name] at bedside to discuss diagnosis prognosis, GOC, EOL wishes, disposition and options.  I introduced Palliative Medicine as specialized medical care for people living with serious illness. It focuses on providing relief from the symptoms and stress of a serious illness. The goal is to improve quality of life for both the patient and the family.  We discussed a brief life review of the patient.  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  She has 2 children-[name] and Barbara.  Barbara is her financial power of attorney and [name] is her healthcare power of attorney.  No paperwork is on file for this.  [name] that he is surprised that it is not in our system but that he will work on getting the paperwork brought to the hospital.  As far as functional status, the patient could make  breakfast and lunch independently.  She was completely independent with all the ADLs prior to this admission.  She has had several falls over the past week so she has been unable to perform all of her ADLs independently.  As far as nutritional status, she was eating 3 meals a day, 2 made by herself and 1 made by her son.  She had some mild coughing intermittently when swallowing but the son reports she was able to eat drink and swallow without difficulty prior to admission.  The patient will no longer be able to be at home given her multiple falls.  She is scheduled to go to a skilled nursing facility for rehabilitation.  According to her son she is back to her baseline of neurological status.  She does have dementia but is pleasantly confused at the moment.  He says he was more concerned about her yesterday since she seemed to be sleeping and on responsive to his voice.  But today she seems much more alert and back to her baseline.    Given her functional, nutritional, and cognitive status she is not a candidate for inpatient hospice at this time.  However she could benefit from hospice services   in the future.  I reviewed what hospice and palliative outpatient services entail.  Since she does not have a diagnosis that conveys she is facing end-of-life in the next 6 months she is a candidate for palliative outpatient services.  The son is in agreement with this plan.  A referral will be placed to's TOC to have palliative outpatient follow Ms. Holt at her skilled nursing facility.  The patient's son said that his sister wanted to be a part of today's discussion but that she could not get out of work.  I left the palliative medicine team's contact information with him.  I advised that he or she can call anytime in order to speak with [place] further.  I let him know that we are available tomorrow and throughout the weekend.  I conveyed that I will be off of service after today but that other members of my team  are available if he has any future concerns or questions.  Discussed with patient/family the importance of continued conversation with family and the medical providers regarding overall plan of care and treatment options, ensuring decisions are within the context of the patient's values and GOCs.    Primary Decision Maker HCPOA  Code Status/Advance Care Planning: DNR  Prognosis:   Unable to determine  Discharge Planning: [place] for rehab with Palliative care service follow-up  Primary Diagnoses: Present on Admission:  UTI (urinary tract infection)  Essential hypertension  Paroxysmal atrial fibrillation (HCC)  T12 compression fracture (HCC)  DNR (do not resuscitate)   Physical Exam Constitutional:      Appearance: Normal appearance.  HENT:     Head: Normocephalic and atraumatic.  Cardiovascular:     Rate and Rhythm: Normal rate.     Pulses: Normal pulses.  Pulmonary:     Effort: Pulmonary effort is normal.  Abdominal:     Palpations: Abdomen is soft.  Skin:    General: Skin is warm and dry.  Neurological:     Mental Status: She is alert. Mental status is at baseline.    Vital Signs: BP (!) 148/96 (BP Location: Right Arm)   Pulse 83   Temp 98.1 F (36.7 C) (Axillary)   Resp (!) 25   Wt 51.4 kg   SpO2 96%   BMI 20.07 kg/m  Pain Scale: PAINAD   Pain Score: 1  SpO2: SpO2: 96 % O2 Device:SpO2: 96 % O2 Flow Rate: .   Palliative Assessment/Data:     I discussed this patient's plan of care with patient, patient's son.  Thank you for this consult. Palliative medicine will continue to follow and assist holistically.   Time Total: 70 minutes Greater than 50%  of this time was spent counseling and coordinating care related to the above assessment and plan.  Signed by: Jordan Hawks, DNP, FNP-BC Palliative Medicine    Please contact Palliative Medicine Team phone at [phone] for questions and concerns.  For individual provider: See  Shea Evans

## 2021-04-08 NOTE — Plan of Care (Signed)

## 2021-04-08 NOTE — Progress Notes (Signed)
HOSPITAL MEDICINE OVERNIGHT EVENT NOTE      Nursing reports that patient has not been able to urinate all shift.  She additionally has been exhibiting increasing abdominal distention and has lower abdominal tenderness on examination.  Bladder scan reveals 362 cc in the bladder.  This patient is physically unable to void I have written an order for an intermittent catheterization x1.  Will monitor for recurrence.  Vernelle Emerald  MD [facility]

## 2021-04-09 DIAGNOSIS — N390 Urinary tract infection, site not specified: Secondary | ICD-10-CM | POA: Diagnosis not present

## 2021-04-09 NOTE — Progress Notes (Signed)
PROGRESS NOTE    [name]  EUM:[id number] DOB: 02-Oct-1929 DOA: 04/04/2021 PCP: Binnie Rail, MD   Brief Narrative: 87 year old with past medical history for paroxysmal A. fib, diabetes, dementia with behavioral disturbance presented emergency department from home after multiple falls and being discovered by her son.  Recent history of basal hallucination, talking to deceased dog, increased confusion, recent severe back pain prior to admission for which outpatient follow-up was planted.  Admitted for UTI, metabolic encephalopathy, mild rhabdomyolysis, multiple falls.  Son requested placement.   Assessment & Plan:   Principal Problem:   UTI (urinary tract infection) Active Problems:   Essential hypertension   Paroxysmal atrial fibrillation (HCC)   Diabetes mellitus without complication (HCC)   V40 compression fracture (HCC)   DNR (do not resuscitate)   Acute metabolic encephalopathy   Dementia with behavioral disturbance ([place])   Fall at home, initial encounter   Rhabdomyolysis   Pulmonary nodule   Aortic atherosclerosis ([place])   Altered mental status   Palliative care by specialist  1-UTI: Urine Culture from 9/18 growing E. coli pansensitive. She has received 5 days of IV ceftriaxone. Monitor for fever/  Urine retention, had I and O. Monitor urine out put.  Per nurse staff, patient has been urinating.   Acute metabolic encephalopathy: Secondary to UTI and complicated by dementia.  CT head negative, no focal deficit. Treating  infection. Improved.   Dementia With behavioral disturbance: Continue with sertraline Delirium Precaution.  Paroxysmal A. fib: Patient continues to have RVR.  Received IV Cardizem overnight. Continue with oral Cardizem every 6 hours. HR improved on cardizem Plan to change Cardizem tomorrow to long acting.   Rhabdomyolysis: Mild.  Secondary to falls at home.  CK trending down. Received IV fluids.   Fall at home, initial encounter:  Multiples at home.  No apparent injury. Needs further discussion in regards continuation of apixaban in the setting of recurrent fall  T12 compression fracture: Pain control.  PT following  Pulmonary nodule: CT follow-up in 3 months DM without complication: Hemoglobin A1c 6.3 on 10/12/2020.    Estimated body mass index is 20.07 kg/m as calculated from the following:   Height as of 10/12/20: 5\' 3"  (1.6 m).   Weight as of this encounter: 51.4 kg.   DVT prophylaxis: On Eliquis Code Status: DNR Family Communication: son updated 9/21, plan to consult palliative care for goals of care.  Disposition Plan:  Status is: Inpatient  Remains inpatient appropriate because:IV treatments appropriate due to intensity of illness or inability to take PO  Dispo: The patient is from: Home              Anticipated d/c is to: SNF              Patient currently is medically stable to d/c. Awaiting SNF   Difficult to place patient No        Consultants:  None  Procedures:  None  Antimicrobials:    Subjective: She is alert, feels better, asking for breakfast/  She denies dyspnea.  She appears breathing better.   Objective: Vitals:   04/09/21 0840 04/09/21 1000 04/09/21 1100 04/09/21 1130  BP: (!) 157/83   133/72  Pulse: 91 90 72 90  Resp: (!) 22 19 19 17   Temp: 98.4 F (36.9 C)   97.8 F (36.6 C)  TempSrc: Oral   Oral  SpO2: 94% 92% 95% 94%  Weight:        Intake/Output Summary (Last 24 hours)  at 04/09/2021 1352 Last data filed at 04/09/2021 1131 Gross per 24 hour  Intake 340 ml  Output 200 ml  Net 140 ml    Filed Weights   04/07/21 0547 04/08/21 0504  Weight: 50.3 kg 51.4 kg    Examination:  General exam: NAD Respiratory system: CTA Cardiovascular system: S 1, S 2 IRR Gastrointestinal system: BS present, soft, nt Central nervous system: Alert, follows command Extremities: No edema   Data Reviewed: I have personally reviewed following labs and imaging  studies  CBC: Recent Labs  Lab 04/04/21 1343 04/05/21 0411  WBC 12.3* 11.6*  NEUTROABS 9.8*  --   HGB 12.8 12.5  HCT 38.9 38.1  MCV 97.0 99.0  PLT 252 347    Basic Metabolic Panel: Recent Labs  Lab 04/04/21 1343 04/05/21 0411 04/06/21 0322 04/07/21 0729 04/08/21 0657  NA 137 137 139 143 143  K 3.4* 4.4 3.6 3.7 3.8  CL 101 104 104 113* 115*  CO2 26 19* 20* 19* 20*  GLUCOSE 136* 152* 163* 154* 124*  BUN 26* 26* 17 22 26*  CREATININE 0.76 0.70 0.60 0.69 0.67  CALCIUM 9.9 9.3 9.2 9.2 8.9  MG  --  1.8  --  1.8  --     GFR: Estimated Creatinine Clearance: 38.7 mL/min (by C-G formula based on SCr of 0.67 mg/dL). Liver Function Tests: Recent Labs  Lab 04/04/21 1343  AST 37  ALT 22  ALKPHOS 91  BILITOT 1.1  PROT 7.1  ALBUMIN 3.5    No results for input(s): LIPASE, AMYLASE in the last 168 hours. No results for input(s): AMMONIA in the last 168 hours. Coagulation Profile: Recent Labs  Lab 04/04/21 1540  INR 1.1    Cardiac Enzymes: Recent Labs  Lab 04/04/21 1540 04/05/21 0411 04/06/21 0322 04/07/21 0729  CKTOTAL 1,506* 1,624* 829* 409*    BNP (last 3 results) No results for input(s): PROBNP in the last 8760 hours. HbA1C: No results for input(s): HGBA1C in the last 72 hours. CBG: No results for input(s): GLUCAP in the last 168 hours. Lipid Profile: No results for input(s): CHOL, HDL, LDLCALC, TRIG, CHOLHDL, LDLDIRECT in the last 72 hours. Thyroid Function Tests: No results for input(s): TSH, T4TOTAL, FREET4, T3FREE, THYROIDAB in the last 72 hours.  Anemia Panel: No results for input(s): VITAMINB12, FOLATE, FERRITIN, TIBC, IRON, RETICCTPCT in the last 72 hours. Sepsis Labs: No results for input(s): PROCALCITON, LATICACIDVEN in the last 168 hours.  Recent Results (from the past 240 hour(s))  Urine Culture     Status: Abnormal   Collection Time: 04/04/21  1:19 PM   Specimen: Urine, Clean Catch  Result Value Ref Range Status   Specimen  Description URINE, CLEAN CATCH  Final   Special Requests   Final    NONE Performed at [facility], [address] [place], [place] [address]    Culture >=100,000 COLONIES/mL ESCHERICHIA COLI (A)  Final   Report Status 04/07/2021 FINAL  Final   Organism ID, Bacteria ESCHERICHIA COLI (A)  Final      Susceptibility   Escherichia coli - MIC*    AMPICILLIN <=2 SENSITIVE Sensitive     CEFAZOLIN <=4 SENSITIVE Sensitive     CEFEPIME <=0.12 SENSITIVE Sensitive     CEFTRIAXONE <=0.25 SENSITIVE Sensitive     CIPROFLOXACIN <=0.25 SENSITIVE Sensitive     GENTAMICIN <=1 SENSITIVE Sensitive     IMIPENEM <=0.25 SENSITIVE Sensitive     NITROFURANTOIN <=16 SENSITIVE Sensitive  TRIMETH/SULFA <=20 SENSITIVE Sensitive     AMPICILLIN/SULBACTAM <=2 SENSITIVE Sensitive     PIP/TAZO <=4 SENSITIVE Sensitive     * >=100,000 COLONIES/mL ESCHERICHIA COLI  Resp Panel by RT-PCR (Flu A&B, Covid) Nasopharyngeal Swab     Status: None   Collection Time: 04/04/21  3:40 PM   Specimen: Nasopharyngeal Swab; Nasopharyngeal(NP) swabs in vial transport medium  Result Value Ref Range Status   SARS Coronavirus 2 by RT PCR NEGATIVE NEGATIVE Final    Comment: (NOTE) SARS-CoV-2 target nucleic acids are NOT DETECTED.  The SARS-CoV-2 RNA is generally detectable in upper respiratory specimens during the acute phase of infection. The lowest concentration of SARS-CoV-2 viral copies this assay can detect is 138 copies/mL. A negative result does not preclude SARS-Cov-2 infection and should not be used as the sole basis for treatment or other patient management decisions. A negative result may occur with  improper specimen collection/handling, submission of specimen other than nasopharyngeal swab, presence of viral mutation(s) within the areas targeted by this assay, and inadequate number of viral copies(<138 copies/mL). A negative result must be combined with clinical observations, patient history, and  epidemiological information. The expected result is Negative.  Fact Sheet for Patients:  EntrepreneurPulse.com.au  Fact Sheet for Healthcare Providers:  IncredibleEmployment.be  This test is no t yet approved or cleared by the [place] FDA and  has been authorized for detection and/or diagnosis of SARS-CoV-2 by FDA under an Emergency Use Authorization (EUA). This EUA will remain  in effect (meaning this test can be used) for the duration of the COVID-19 declaration under Section 564(b)(1) of the Act, 21 U.S.C.section 360bbb-3(b)(1), unless the authorization is terminated  or revoked sooner.       Influenza A by PCR NEGATIVE NEGATIVE Final   Influenza B by PCR NEGATIVE NEGATIVE Final    Comment: (NOTE) The Xpert Xpress SARS-CoV-2/FLU/RSV plus assay is intended as an aid in the diagnosis of influenza from Nasopharyngeal swab specimens and should not be used as a sole basis for treatment. Nasal washings and aspirates are unacceptable for Xpert Xpress SARS-CoV-2/FLU/RSV testing.  Fact Sheet for Patients: EntrepreneurPulse.com.au  Fact Sheet for Healthcare Providers: IncredibleEmployment.be  This test is not yet approved or cleared by the [place] FDA and has been authorized for detection and/or diagnosis of SARS-CoV-2 by FDA under an Emergency Use Authorization (EUA). This EUA will remain in effect (meaning this test can be used) for the duration of the COVID-19 declaration under Section 564(b)(1) of the Act, 21 U.S.C. section 360bbb-3(b)(1), unless the authorization is terminated or revoked.  Performed at [facility], [place] [address], [place], [place] [address]           Radiology Studies: No results found.      Scheduled Meds:  apixaban  2.5 mg Oral BID   diltiazem  30 mg Oral Q6H   metoprolol succinate  50 mg Oral BID   sertraline  50 mg Oral Daily   tamsulosin  0.4 mg  Oral Daily   Continuous Infusions:  cefTRIAXone (ROCEPHIN)  IV 1 g (04/08/21 2306)     LOS: 5 days    Time spent: 35 minutes.     Elmarie Shiley, MD [facility]   If 7PM-7AM, please contact night-coverage [email or web address]  04/09/2021, 1:52 PM

## 2021-04-09 NOTE — TOC Progression Note (Signed)
Transition of Care [facility]) - Progression Note    Patient Details  Name: [name] MRN: [id number] Date of Birth: 1929/11/02  [facility]) CM/SW Contact  Coralee Pesa, [place] Phone Number: 04/09/2021, 12:32 PM  Clinical Narrative:    CSW spoke with pt's son and provided bed offers ([facility] and [facility]). He asked that pt also be faxed to [facility] or [facility]. CSW explained that those facilities are difficult o be admitted to, but a referral wpuld be sent. Son states that he knows someone in [place] and will see if he can call in a favor. CSW requested he consider the bed offers he has and call back with a choice. TOC will continue to follow for DC needs.   Expected Discharge Plan: [place] Barriers to Discharge: Continued Medical Work up, Ship broker  Expected Discharge Plan and Services Expected Discharge Plan: [place] Choice: [place] arrangements for the past 2 months: Single Family Home                                       Social Determinants of Health (SDOH) Interventions    Readmission Risk Interventions No flowsheet data found.

## 2021-04-10 DIAGNOSIS — G9341 Metabolic encephalopathy: Secondary | ICD-10-CM | POA: Diagnosis not present

## 2021-04-10 DIAGNOSIS — W19XXXA Unspecified fall, initial encounter: Secondary | ICD-10-CM

## 2021-04-10 DIAGNOSIS — E119 Type 2 diabetes mellitus without complications: Secondary | ICD-10-CM

## 2021-04-10 DIAGNOSIS — F0391 Unspecified dementia with behavioral disturbance: Secondary | ICD-10-CM | POA: Diagnosis not present

## 2021-04-10 DIAGNOSIS — R4182 Altered mental status, unspecified: Secondary | ICD-10-CM | POA: Diagnosis not present

## 2021-04-10 DIAGNOSIS — Z7189 Other specified counseling: Secondary | ICD-10-CM

## 2021-04-10 DIAGNOSIS — I10 Essential (primary) hypertension: Secondary | ICD-10-CM

## 2021-04-10 DIAGNOSIS — N390 Urinary tract infection, site not specified: Secondary | ICD-10-CM | POA: Diagnosis not present

## 2021-04-10 LAB — BASIC METABOLIC PANEL
Anion gap: 8 (ref 5–15)
BUN: 18 mg/dL (ref 8–23)
CO2: 26 mmol/L (ref 22–32)
Calcium: 8.6 mg/dL — ABNORMAL LOW (ref 8.9–10.3)
Chloride: 110 mmol/L (ref 98–111)
Creatinine, Ser: 0.52 mg/dL (ref 0.44–1.00)
GFR, Estimated: >60 mL/min (ref >60)
Glucose, Bld: 121 mg/dL — ABNORMAL HIGH (ref 70–99)
Potassium: 3 mmol/L — ABNORMAL LOW (ref 3.5–5.1)
Sodium: 144 mmol/L (ref 135–145)

## 2021-04-10 LAB — CBC
HCT: 36.8 % (ref 36.0–46.0)
Hemoglobin: 11.9 g/dL — ABNORMAL LOW (ref 12.0–15.0)
MCH: 32.3 pg (ref 26.0–34.0)
MCHC: 32.3 g/dL (ref 30.0–36.0)
MCV: 100 fL (ref 80.0–100.0)
Platelets: 255 10*3/uL (ref 150–400)
RBC: 3.68 MIL/uL — ABNORMAL LOW (ref 3.87–5.11)
RDW: 14.8 % (ref 11.5–15.5)
WBC: 11.3 10*3/uL — ABNORMAL HIGH (ref 4.0–10.5)
nRBC: 0.2 % (ref 0.0–0.2)

## 2021-04-10 MED ORDER — DILTIAZEM HCL ER COATED BEADS 120 MG PO CP24
120.0000 mg | ORAL_CAPSULE | Freq: Every day | ORAL | Status: DC
  Administered 2021-04-10 – 2021-04-14 (×4): 120 mg via ORAL
  Filled 2021-04-10 (×5): qty 1

## 2021-04-10 MED ORDER — POTASSIUM CHLORIDE 20 MEQ PO PACK
40.0000 meq | PACK | Freq: Two times a day (BID) | ORAL | Status: AC
  Administered 2021-04-10 (×2): 40 meq via ORAL
  Filled 2021-04-10 (×2): qty 2

## 2021-04-10 NOTE — Progress Notes (Signed)
PROGRESS NOTE    [name]  ZOX:[id number] DOB: [birth year] DOA: 04/04/2021 PCP: Binnie Rail, MD   Brief Narrative: 87 year old with past medical history for paroxysmal A. fib, diabetes, dementia with behavioral disturbance presented emergency department from home after multiple falls and being discovered by her son.  Recent history of basal hallucination, talking to deceased dog, increased confusion, recent severe back pain prior to admission for which outpatient follow-up was planted.  Admitted for UTI, metabolic encephalopathy, mild rhabdomyolysis, multiple falls.  Son requested placement.   Assessment & Plan:   Principal Problem:   UTI (urinary tract infection) Active Problems:   Essential hypertension   Paroxysmal atrial fibrillation (HCC)   Diabetes mellitus without complication (HCC)   W11 compression fracture (HCC)   DNR (do not resuscitate)   Acute metabolic encephalopathy   Dementia with behavioral disturbance ([place])   Fall at home, initial encounter   Rhabdomyolysis   Pulmonary nodule   Aortic atherosclerosis ([place])   Altered mental status   Palliative care by specialist  1-UTI: Urine Culture from 9/18 growing E. coli pansensitive. She has received 5 days of IV ceftriaxone. Monitor for fever/  Urine retention, had I and O. Monitor urine out put.  Per nurse staff, patient has been urinating.   Acute metabolic encephalopathy: Secondary to UTI and complicated by dementia.  CT head negative, no focal deficit. Treating  infection. Improved.   Dementia With behavioral disturbance: Continue with sertraline Delirium Precaution.  Paroxysmal A. fib: Patient continues to have RVR.  Received IV Cardizem overnight. Continue with oral Cardizem every 6 hours. HR improved on cardizem Change Cardizem to 120 daily.   Rhabdomyolysis: Mild.  Secondary to falls at home.  CK trending down. Received IV fluids.   Fall at home, initial encounter: Multiples at home.  No  apparent injury. Needs further discussion in regards continuation of apixaban in the setting of recurrent fall  T12 compression fracture: Pain control.  PT following  Pulmonary nodule: CT follow-up in 3 months DM without complication: Hemoglobin A1c 6.3 on 10/12/2020. Hypokalemia; replete orally.     Estimated body mass index is 20.07 kg/m as calculated from the following:   Height as of 10/12/20: 5\' 3"  (1.6 m).   Weight as of this encounter: 51.4 kg.   DVT prophylaxis: On Eliquis Code Status: DNR Family Communication: son updated 9/23 Disposition Plan:  Status is: Inpatient  Remains inpatient appropriate because:IV treatments appropriate due to intensity of illness or inability to take PO  Dispo: The patient is from: Home              Anticipated d/c is to: SNF              Patient currently is medically stable to d/c. Awaiting SNF   Difficult to place patient No        Consultants:  None  Procedures:  None  Antimicrobials:    Subjective: She is bed. Feels of. Denies dyspnea or chest pain. Nurse will feed her.  She is pleasantly confuse.   Objective: Vitals:   04/10/21 0600 04/10/21 0737 04/10/21 0909 04/10/21 1112  BP:  (!) 159/89 (!) 149/69 115/82  Pulse: 79 84 81 77  Resp: 16 19  (!) 21  Temp:  98.2 F (36.8 C)  98 F (36.7 C)  TempSrc:  Oral  Oral  SpO2: 94% 97%  90%  Weight:        Intake/Output Summary (Last 24 hours) at 04/10/2021 1233 Last data filed  at 04/10/2021 0933 Gross per 24 hour  Intake 360 ml  Output 200 ml  Net 160 ml    Filed Weights   04/07/21 0547 04/08/21 0504  Weight: 50.3 kg 51.4 kg    Examination:  General exam: NAD Respiratory system: CTA Cardiovascular system: S 1, S 2 RRR Gastrointestinal system: BS present, soft, nt Central nervous system: Alert, follows command Extremities: No edema   Data Reviewed: I have personally reviewed following labs and imaging studies  CBC: Recent Labs  Lab 04/04/21 1343  04/05/21 0411 04/10/21 0806  WBC 12.3* 11.6* 11.3*  NEUTROABS 9.8*  --   --   HGB 12.8 12.5 11.9*  HCT 38.9 38.1 36.8  MCV 97.0 99.0 100.0  PLT 252 223 989    Basic Metabolic Panel: Recent Labs  Lab 04/05/21 0411 04/06/21 0322 04/07/21 0729 04/08/21 0657 04/10/21 0806  NA 137 139 143 143 144  K 4.4 3.6 3.7 3.8 3.0*  CL 104 104 113* 115* 110  CO2 19* 20* 19* 20* 26  GLUCOSE 152* 163* 154* 124* 121*  BUN 26* 17 22 26* 18  CREATININE 0.70 0.60 0.69 0.67 0.52  CALCIUM 9.3 9.2 9.2 8.9 8.6*  MG 1.8  --  1.8  --   --     GFR: Estimated Creatinine Clearance: 38.7 mL/min (by C-G formula based on SCr of 0.52 mg/dL). Liver Function Tests: Recent Labs  Lab 04/04/21 1343  AST 37  ALT 22  ALKPHOS 91  BILITOT 1.1  PROT 7.1  ALBUMIN 3.5    No results for input(s): LIPASE, AMYLASE in the last 168 hours. No results for input(s): AMMONIA in the last 168 hours. Coagulation Profile: Recent Labs  Lab 04/04/21 1540  INR 1.1    Cardiac Enzymes: Recent Labs  Lab 04/04/21 1540 04/05/21 0411 04/06/21 0322 04/07/21 0729  CKTOTAL 1,506* 1,624* 829* 409*    BNP (last 3 results) No results for input(s): PROBNP in the last 8760 hours. HbA1C: No results for input(s): HGBA1C in the last 72 hours. CBG: No results for input(s): GLUCAP in the last 168 hours. Lipid Profile: No results for input(s): CHOL, HDL, LDLCALC, TRIG, CHOLHDL, LDLDIRECT in the last 72 hours. Thyroid Function Tests: No results for input(s): TSH, T4TOTAL, FREET4, T3FREE, THYROIDAB in the last 72 hours.  Anemia Panel: No results for input(s): VITAMINB12, FOLATE, FERRITIN, TIBC, IRON, RETICCTPCT in the last 72 hours. Sepsis Labs: No results for input(s): PROCALCITON, LATICACIDVEN in the last 168 hours.  Recent Results (from the past 240 hour(s))  Urine Culture     Status: Abnormal   Collection Time: 04/04/21  1:19 PM   Specimen: Urine, Clean Catch  Result Value Ref Range Status   Specimen Description  URINE, CLEAN CATCH  Final   Special Requests   Final    NONE Performed at [facility], [address], [place],  [address]    Culture >=100,000 COLONIES/mL ESCHERICHIA COLI (A)  Final   Report Status 04/07/2021 FINAL  Final   Organism ID, Bacteria ESCHERICHIA COLI (A)  Final      Susceptibility   Escherichia coli - MIC*    AMPICILLIN <=2 SENSITIVE Sensitive     CEFAZOLIN <=4 SENSITIVE Sensitive     CEFEPIME <=0.12 SENSITIVE Sensitive     CEFTRIAXONE <=0.25 SENSITIVE Sensitive     CIPROFLOXACIN <=0.25 SENSITIVE Sensitive     GENTAMICIN <=1 SENSITIVE Sensitive     IMIPENEM <=0.25 SENSITIVE Sensitive     NITROFURANTOIN <=16 SENSITIVE Sensitive  TRIMETH/SULFA <=20 SENSITIVE Sensitive     AMPICILLIN/SULBACTAM <=2 SENSITIVE Sensitive     PIP/TAZO <=4 SENSITIVE Sensitive     * >=100,000 COLONIES/mL ESCHERICHIA COLI  Resp Panel by RT-PCR (Flu A&B, Covid) Nasopharyngeal Swab     Status: None   Collection Time: 04/04/21  3:40 PM   Specimen: Nasopharyngeal Swab; Nasopharyngeal(NP) swabs in vial transport medium  Result Value Ref Range Status   SARS Coronavirus 2 by RT PCR NEGATIVE NEGATIVE Final    Comment: (NOTE) SARS-CoV-2 target nucleic acids are NOT DETECTED.  The SARS-CoV-2 RNA is generally detectable in upper respiratory specimens during the acute phase of infection. The lowest concentration of SARS-CoV-2 viral copies this assay can detect is 138 copies/mL. A negative result does not preclude SARS-Cov-2 infection and should not be used as the sole basis for treatment or other patient management decisions. A negative result may occur with  improper specimen collection/handling, submission of specimen other than nasopharyngeal swab, presence of viral mutation(s) within the areas targeted by this assay, and inadequate number of viral copies(<138 copies/mL). A negative result must be combined with clinical observations, patient history, and  epidemiological information. The expected result is Negative.  Fact Sheet for Patients:  EntrepreneurPulse.com.au  Fact Sheet for Healthcare Providers:  IncredibleEmployment.be  This test is no t yet approved or cleared by the [place] FDA and  has been authorized for detection and/or diagnosis of SARS-CoV-2 by FDA under an Emergency Use Authorization (EUA). This EUA will remain  in effect (meaning this test can be used) for the duration of the COVID-19 declaration under Section 564(b)(1) of the Act, 21 U.S.C.section 360bbb-3(b)(1), unless the authorization is terminated  or revoked sooner.       Influenza A by PCR NEGATIVE NEGATIVE Final   Influenza B by PCR NEGATIVE NEGATIVE Final    Comment: (NOTE) The Xpert Xpress SARS-CoV-2/FLU/RSV plus assay is intended as an aid in the diagnosis of influenza from Nasopharyngeal swab specimens and should not be used as a sole basis for treatment. Nasal washings and aspirates are unacceptable for Xpert Xpress SARS-CoV-2/FLU/RSV testing.  Fact Sheet for Patients: EntrepreneurPulse.com.au  Fact Sheet for Healthcare Providers: IncredibleEmployment.be  This test is not yet approved or cleared by the [place] FDA and has been authorized for detection and/or diagnosis of SARS-CoV-2 by FDA under an Emergency Use Authorization (EUA). This EUA will remain in effect (meaning this test can be used) for the duration of the COVID-19 declaration under Section 564(b)(1) of the Act, 21 U.S.C. section 360bbb-3(b)(1), unless the authorization is terminated or revoked.  Performed at [facility], [address], [place], [place] [address]           Radiology Studies: No results found.      Scheduled Meds:  apixaban  2.5 mg Oral BID   diltiazem  120 mg Oral Daily   metoprolol succinate  50 mg Oral BID   sertraline  50 mg Oral Daily   tamsulosin  0.4  mg Oral Daily   Continuous Infusions:     LOS: 6 days    Time spent: 35 minutes.     Elmarie Shiley, MD [facility]   If 7PM-7AM, please contact night-coverage [email or web address]  04/10/2021, 12:33 PM

## 2021-04-10 NOTE — Plan of Care (Signed)
Patient remains confused.  Attempted to educate on care plans.     Problem: Education: Goal: Knowledge of General Education information will improve Description: Including pain rating scale, medication(s)/side effects and non-pharmacologic comfort measures Outcome: Not Progressing   Problem: Health Behavior/Discharge Planning: Goal: Ability to manage health-related needs will improve Outcome: Not Progressing   Problem: Clinical Measurements: Goal: Ability to maintain clinical measurements within normal limits will improve Outcome: Not Progressing Goal: Will remain free from infection Outcome: Not Progressing Goal: Respiratory complications will improve Outcome: Not Progressing   Problem: Activity: Goal: Risk for activity intolerance will decrease Outcome: Not Progressing   Problem: Nutrition: Goal: Adequate nutrition will be maintained Outcome: Not Progressing   Problem: Coping: Goal: Level of anxiety will decrease Outcome: Not Progressing   Problem: Pain Managment: Goal: General experience of comfort will improve Outcome: Not Progressing   Problem: Safety: Goal: Ability to remain free from injury will improve Outcome: Not Progressing   Problem: Skin Integrity: Goal: Risk for impaired skin integrity will decrease Outcome: Not Progressing

## 2021-04-10 NOTE — Progress Notes (Signed)
   Palliative Medicine Inpatient Follow Up Note     Chart Reviewed. Patient assessed at the bedside.   Resting in bed. Denies pain. Pleasantly confused. No family at the bedside during both bedside occassions. Patient tolerating oral nutrition with feeding assistance.   I spoke with daughter, [name] via phone. She shares her brother is the main contact/HCPOA and updates her however appreciative of the phone call. Updates provided from a palliative aspect. Daughter verbalized understanding and her discussions with her brother regarding goals of care and plans.   [name] shares family was hopeful patient could go to [facility] however this is unable to happen. They are working closely with [facility] regarding SNF placement as they are realistic in their understanding patient requires additional assistance at this time.   Goals remain clear and set for outpatient palliative support at discharge. DNR/DNI.   Discussed the importance of continued conversation with family and their  medical providers regarding overall plan of care and treatment options, ensuring decisions are within the context of the patients values and GOCs.  Questions addressed and support provided.    Objective Assessment: Vital Signs Vitals:   04/10/21 1112 04/10/21 1253  BP: 115/82 121/72  Pulse: 77   Resp: (!) 21   Temp: 98 F (36.7 C)   SpO2: 90%     Intake/Output Summary (Last 24 hours) at 04/10/2021 1546 Last data filed at 04/10/2021 1300 Gross per 24 hour  Intake 480 ml  Output 200 ml  Net 280 ml   Last Weight  Most recent update: 04/08/2021  5:05 AM    Weight  51.4 kg (113 lb 5.1 oz)            Gen:  NAD, elderly female HEENT: moist mucous membranes CV: Regular rate and rhythm, no murmurs rubs or gallops PULM: clear to auscultation bilaterally. No wheezes/rales/rhonchi ABD: soft/nontender/nondistended/normal bowel sounds Neuro: Pleasantly confused, alert, follows commands  SUMMARY OF  RECOMMENDATIONS   Continue with current plan of care per medical team/ DNR/DNI Goals are clear and set for SNF at discharge and outpatient palliative support. Family will continue to have ongoing discussions and closely evaluate for future needs and limitations of care.  PMT will continue to support and follow on as needed basis. Will not see daily in the setting of goals clear and set. Please secure chat for urgent needs.   Time Total: 40 min.   Visit consisted of counseling and education dealing with the complex and emotionally intense issues of symptom management and palliative care in the setting of serious and potentially life-threatening illness.Greater than 50%  of this time was spent counseling and coordinating care related to the above assessment and plan.  Alda Lea, AGPCNP-BC  Palliative Medicine Team [phone]  Palliative Medicine Team providers are available by phone from 7am to 7pm daily and can be reached through the team cell phone. Should this patient require assistance outside of these hours, please call the patient's attending physician.

## 2021-04-10 NOTE — TOC Progression Note (Addendum)
Transition of Care [facility]) - Progression Note    Patient Details  Name: TONNYA GARBETT MRN: [id number] Date of Birth: 30-Oct-1929  Transition of Care [facility]) CM/SW [place], [place] Phone Number: 04/10/2021, 12:57 PM  Clinical Narrative:    2:15- CSW received call from [name] who confirmed they have chosen [facility] care. CSW notified facility and they stated they will have a bed available early next week. CSW will hold off on insurance authorization for now. TOC will continue to follow.  CSW spoke with pt's son [name], to follow up on the conversation from [employer]. CSW noted that [facility] had declined at this time  and they still had the same two bed offers. [name] noted that his sister would research them today and let CSW know. CSW will begin Insurance authorization as soon as a facility is chosen. TOC will continue to follow.   Expected Discharge Plan: [place] Barriers to Discharge: Continued Medical Work up, Ship broker  Expected Discharge Plan and Services Expected Discharge Plan: [place] Choice: [place] arrangements for the past 2 months: Single Family Home                                       Social Determinants of Health (SDOH) Interventions    Readmission Risk Interventions No flowsheet data found.

## 2021-04-11 DIAGNOSIS — N390 Urinary tract infection, site not specified: Secondary | ICD-10-CM | POA: Diagnosis not present

## 2021-04-11 LAB — BASIC METABOLIC PANEL
Anion gap: 5 (ref 5–15)
BUN: 16 mg/dL (ref 8–23)
CO2: 26 mmol/L (ref 22–32)
Calcium: 8.8 mg/dL — ABNORMAL LOW (ref 8.9–10.3)
Chloride: 110 mmol/L (ref 98–111)
Creatinine, Ser: 0.59 mg/dL (ref 0.44–1.00)
GFR, Estimated: >60 mL/min (ref >60)
Glucose, Bld: 123 mg/dL — ABNORMAL HIGH (ref 70–99)
Potassium: 3.9 mmol/L (ref 3.5–5.1)
Sodium: 141 mmol/L (ref 135–145)

## 2021-04-11 MED ORDER — SODIUM CHLORIDE 0.9 % IV SOLN: INTRAVENOUS | Status: AC

## 2021-04-11 MED ORDER — SODIUM CHLORIDE 0.9 % IV SOLN: INTRAVENOUS | Status: DC

## 2021-04-11 NOTE — Progress Notes (Signed)
PROGRESS NOTE    [name]  PNT:[id number] DOB: [birth year] DOA: 04/04/2021 PCP: Binnie Rail, MD   Brief Narrative: 87 year old with past medical history for paroxysmal A. fib, diabetes, dementia with behavioral disturbance presented emergency department from home after multiple falls and being discovered by her son.  Recent history of basal hallucination, talking to deceased dog, increased confusion, recent severe back pain prior to admission for which outpatient follow-up was planted.  Admitted for UTI, metabolic encephalopathy, mild rhabdomyolysis, multiple falls.  Son requested placement.   Assessment & Plan:   Principal Problem:   UTI (urinary tract infection) Active Problems:   Essential hypertension   Paroxysmal atrial fibrillation (HCC)   Diabetes mellitus without complication (HCC)   G86 compression fracture (HCC)   DNR (do not resuscitate)   Acute metabolic encephalopathy   Dementia with behavioral disturbance ([place])   Fall at home, initial encounter   Rhabdomyolysis   Pulmonary nodule   Aortic atherosclerosis ([place])   Altered mental status   Palliative care by specialist  1-UTI: Urine Culture from 9/18 growing E. coli pansensitive. She has received 5 days of IV ceftriaxone. Monitor for fever/  Urine retention, had I and O. Per nurse staff, patient has been urinating.   Acute metabolic encephalopathy: Secondary to UTI and complicated by dementia.  CT head negative, no focal deficit. Treating  infection. Stable.   Dementia With behavioral disturbance: Continue with sertraline Delirium Precaution.  Paroxysmal A. fib: HR improved on cardizem Continue with Cardizem to 120 daily.  Continue with metoprolol/.   Rhabdomyolysis: Mild.  Secondary to falls at home.  CK trending down. Received IV fluids.   Fall at home, initial encounter: Multiples at home.  No apparent injury. Needs further discussion in regards continuation of apixaban in the setting of  recurrent fall  T12 compression fracture: Pain control.  PT following  Pulmonary nodule: CT follow-up in 3 months DM without complication: Hemoglobin A1c 6.3 on 10/12/2020. Hypokalemia; replete orally.     Estimated body mass index is 20.07 kg/m as calculated from the following:   Height as of 10/12/20: 5\' 3"  (1.6 m).   Weight as of this encounter: 51.4 kg.   DVT prophylaxis: On Eliquis Code Status: DNR Family Communication: son updated 9/23 Disposition Plan:  Status is: Inpatient  Remains inpatient appropriate because:IV treatments appropriate due to intensity of illness or inability to take PO  Dispo: The patient is from: Home              Anticipated d/c is to: SNF              Patient currently is medically stable to d/c. Awaiting SNF   Difficult to place patient No        Consultants:  None  Procedures:  None  Antimicrobials:    Subjective: She is alert, pleasantly confuse. Denies pian or dyspnea.   Objective: Vitals:   04/11/21 0400 04/11/21 0435 04/11/21 0828 04/11/21 1026  BP:  (!) 156/93 (!) 161/88 (!) 162/92  Pulse: 73 75 77 89  Resp: 20 19 20    Temp:  98.4 F (36.9 C) 97.9 F (36.6 C)   TempSrc:  Oral Oral   SpO2: 96% 93% 97%   Weight:        Intake/Output Summary (Last 24 hours) at 04/11/2021 1046 Last data filed at 04/10/2021 1755 Gross per 24 hour  Intake 340 ml  Output --  Net 340 ml    Filed Weights   04/07/21 0547  04/08/21 0504  Weight: 50.3 kg 51.4 kg    Examination:  General exam: NAD Respiratory system: CTA Cardiovascular system: S 1, S 2 RRR Gastrointestinal system: BS present, soft, nt Central nervous system: alert, follows command Extremities: No edema   Data Reviewed: I have personally reviewed following labs and imaging studies  CBC: Recent Labs  Lab 04/04/21 1343 04/05/21 0411 04/10/21 0806  WBC 12.3* 11.6* 11.3*  NEUTROABS 9.8*  --   --   HGB 12.8 12.5 11.9*  HCT 38.9 38.1 36.8  MCV 97.0 99.0 100.0   PLT 252 223 182    Basic Metabolic Panel: Recent Labs  Lab 04/05/21 0411 04/06/21 0322 04/07/21 0729 04/08/21 0657 04/10/21 0806 04/11/21 0807  NA 137 139 143 143 144 141  K 4.4 3.6 3.7 3.8 3.0* 3.9  CL 104 104 113* 115* 110 110  CO2 19* 20* 19* 20* 26 26  GLUCOSE 152* 163* 154* 124* 121* 123*  BUN 26* 17 22 26* 18 16  CREATININE 0.70 0.60 0.69 0.67 0.52 0.59  CALCIUM 9.3 9.2 9.2 8.9 8.6* 8.8*  MG 1.8  --  1.8  --   --   --     GFR: CrCl cannot be calculated (Unknown ideal weight.). Liver Function Tests: Recent Labs  Lab 04/04/21 1343  AST 37  ALT 22  ALKPHOS 91  BILITOT 1.1  PROT 7.1  ALBUMIN 3.5    No results for input(s): LIPASE, AMYLASE in the last 168 hours. No results for input(s): AMMONIA in the last 168 hours. Coagulation Profile: Recent Labs  Lab 04/04/21 1540  INR 1.1    Cardiac Enzymes: Recent Labs  Lab 04/04/21 1540 04/05/21 0411 04/06/21 0322 04/07/21 0729  CKTOTAL 1,506* 1,624* 829* 409*    BNP (last 3 results) No results for input(s): PROBNP in the last 8760 hours. HbA1C: No results for input(s): HGBA1C in the last 72 hours. CBG: No results for input(s): GLUCAP in the last 168 hours. Lipid Profile: No results for input(s): CHOL, HDL, LDLCALC, TRIG, CHOLHDL, LDLDIRECT in the last 72 hours. Thyroid Function Tests: No results for input(s): TSH, T4TOTAL, FREET4, T3FREE, THYROIDAB in the last 72 hours.  Anemia Panel: No results for input(s): VITAMINB12, FOLATE, FERRITIN, TIBC, IRON, RETICCTPCT in the last 72 hours. Sepsis Labs: No results for input(s): PROCALCITON, LATICACIDVEN in the last 168 hours.  Recent Results (from the past 240 hour(s))  Urine Culture     Status: Abnormal   Collection Time: 04/04/21  1:19 PM   Specimen: Urine, Clean Catch  Result Value Ref Range Status   Specimen Description URINE, CLEAN CATCH  Final   Special Requests   Final    NONE Performed at [facility], [address], [place],   [address]    Culture >=100,000 COLONIES/mL ESCHERICHIA COLI (A)  Final   Report Status 04/07/2021 FINAL  Final   Organism ID, Bacteria ESCHERICHIA COLI (A)  Final      Susceptibility   Escherichia coli - MIC*    AMPICILLIN <=2 SENSITIVE Sensitive     CEFAZOLIN <=4 SENSITIVE Sensitive     CEFEPIME <=0.12 SENSITIVE Sensitive     CEFTRIAXONE <=0.25 SENSITIVE Sensitive     CIPROFLOXACIN <=0.25 SENSITIVE Sensitive     GENTAMICIN <=1 SENSITIVE Sensitive     IMIPENEM <=0.25 SENSITIVE Sensitive     NITROFURANTOIN <=16 SENSITIVE Sensitive     TRIMETH/SULFA <=20 SENSITIVE Sensitive     AMPICILLIN/SULBACTAM <=2 SENSITIVE Sensitive     PIP/TAZO <=4  SENSITIVE Sensitive     * >=100,000 COLONIES/mL ESCHERICHIA COLI  Resp Panel by RT-PCR (Flu A&B, Covid) Nasopharyngeal Swab     Status: None   Collection Time: 04/04/21  3:40 PM   Specimen: Nasopharyngeal Swab; Nasopharyngeal(NP) swabs in vial transport medium  Result Value Ref Range Status   SARS Coronavirus 2 by RT PCR NEGATIVE NEGATIVE Final    Comment: (NOTE) SARS-CoV-2 target nucleic acids are NOT DETECTED.  The SARS-CoV-2 RNA is generally detectable in upper respiratory specimens during the acute phase of infection. The lowest concentration of SARS-CoV-2 viral copies this assay can detect is 138 copies/mL. A negative result does not preclude SARS-Cov-2 infection and should not be used as the sole basis for treatment or other patient management decisions. A negative result may occur with  improper specimen collection/handling, submission of specimen other than nasopharyngeal swab, presence of viral mutation(s) within the areas targeted by this assay, and inadequate number of viral copies(<138 copies/mL). A negative result must be combined with clinical observations, patient history, and epidemiological information. The expected result is Negative.  Fact Sheet for Patients:  EntrepreneurPulse.com.au  Fact Sheet for  Healthcare Providers:  IncredibleEmployment.be  This test is no t yet approved or cleared by the [place] FDA and  has been authorized for detection and/or diagnosis of SARS-CoV-2 by FDA under an Emergency Use Authorization (EUA). This EUA will remain  in effect (meaning this test can be used) for the duration of the COVID-19 declaration under Section 564(b)(1) of the Act, 21 U.S.C.section 360bbb-3(b)(1), unless the authorization is terminated  or revoked sooner.       Influenza A by PCR NEGATIVE NEGATIVE Final   Influenza B by PCR NEGATIVE NEGATIVE Final    Comment: (NOTE) The Xpert Xpress SARS-CoV-2/FLU/RSV plus assay is intended as an aid in the diagnosis of influenza from Nasopharyngeal swab specimens and should not be used as a sole basis for treatment. Nasal washings and aspirates are unacceptable for Xpert Xpress SARS-CoV-2/FLU/RSV testing.  Fact Sheet for Patients: EntrepreneurPulse.com.au  Fact Sheet for Healthcare Providers: IncredibleEmployment.be  This test is not yet approved or cleared by the [place] FDA and has been authorized for detection and/or diagnosis of SARS-CoV-2 by FDA under an Emergency Use Authorization (EUA). This EUA will remain in effect (meaning this test can be used) for the duration of the COVID-19 declaration under Section 564(b)(1) of the Act, 21 U.S.C. section 360bbb-3(b)(1), unless the authorization is terminated or revoked.  Performed at [facility], [place] [address] [place], [place] [address]           Radiology Studies: No results found.      Scheduled Meds:  apixaban  2.5 mg Oral BID   diltiazem  120 mg Oral Daily   metoprolol succinate  50 mg Oral BID   sertraline  50 mg Oral Daily   tamsulosin  0.4 mg Oral Daily   Continuous Infusions:     LOS: 7 days    Time spent: 35 minutes.     Elmarie Shiley, MD [facility]   If  7PM-7AM, please contact night-coverage [email or web address]  04/11/2021, 10:46 AM

## 2021-04-12 ENCOUNTER — Ambulatory Visit: Payer: Medicare Other | Admitting: Internal Medicine

## 2021-04-12 DIAGNOSIS — N390 Urinary tract infection, site not specified: Secondary | ICD-10-CM | POA: Diagnosis not present

## 2021-04-12 LAB — SARS CORONAVIRUS 2 (TAT 6-24 HRS): SARS Coronavirus 2: NEGATIVE

## 2021-04-12 MED ORDER — SENNA 8.6 MG PO TABS
1.0000 | ORAL_TABLET | Freq: Every day | ORAL | Status: DC
  Administered 2021-04-12 – 2021-04-14 (×2): 8.6 mg via ORAL
  Filled 2021-04-12 (×2): qty 1

## 2021-04-12 MED ORDER — POLYETHYLENE GLYCOL 3350 17 G PO PACK
17.0000 g | PACK | Freq: Two times a day (BID) | ORAL | Status: DC
  Administered 2021-04-12 – 2021-04-13 (×3): 17 g via ORAL
  Filled 2021-04-12 (×3): qty 1

## 2021-04-12 NOTE — Progress Notes (Signed)
Physical Therapy Treatment Patient Details Name: [name] MRN: [id number] DOB: 04-30-1930 Today's Date: 04/12/2021   History of Present Illness Pt is an 87 y/o female admitted secondary to multiple falls and confusion. Thought to be secondary to UTI. Also found to have T8 compression fx to be managed conservatively. PMH includes HTN, a fib, DM, osteoporosis, bladder cancer, and chronic thoracic compression fx.    PT Comments    Pt progressing towards physical therapy goals. We progressed to OOB to chair and pt tolerated well without complaints of pain. Noted non-blanching redness on spinal processes and RN notified to visualize prior to placing foam dressing over top. Sacral foam also placed prophylactically. Will continue to follow and progress as able per POC.    Recommendations for follow up therapy are one component of a multi-disciplinary discharge planning process, led by the attending physician.  Recommendations may be updated based on patient status, additional functional criteria and insurance authorization.  Follow Up Recommendations  SNF;Supervision/Assistance - 24 hour     Equipment Recommendations  None recommended by PT    Recommendations for Other Services       Precautions / Restrictions Precautions Precautions: Fall;Back Precaution Booklet Issued: No Precaution Comments: Watch HR; Multiple falls at home Restrictions Weight Bearing Restrictions: No     Mobility  Bed Mobility Overal bed mobility: Needs Assistance Bed Mobility: Rolling;Sidelying to Sit;Sit to Sidelying Rolling: Min assist Sidelying to sit: Mod assist       General bed mobility comments: Log roll for safety. Assist required for rolling and elevation of trunk to full sitting position. Pt initiated movement and was able to follow commands to reach for the R railing with LUE    Transfers Overall transfer level: Needs assistance Equipment used: 1 person hand held assist Transfers: Sit  to/from [facility] Sit to Stand: Max assist;+2 safety/equipment Stand pivot transfers: Max assist;+2 physical assistance;+2 safety/equipment       General transfer comment: VC's for hand placement on seated surface for safety. Pt SPT from bed to [facility] and then BSC to recliner.  Ambulation/Gait             General Gait Details: Unable to progress to gait training this session.   Stairs             Wheelchair Mobility    Modified Rankin (Stroke Patients Only)       Balance Overall balance assessment: Needs assistance Sitting-balance support: Bilateral upper extremity supported;Feet supported Sitting balance-Leahy Scale: Poor Sitting balance - Comments: Reliant on UE and support posteriorly at times due to posterior lean Postural control: Posterior lean;Left lateral lean Standing balance support: Bilateral upper extremity supported Standing balance-Leahy Scale: Zero Standing balance comment: max assist                            Cognition Arousal/Alertness: Awake/alert Behavior During Therapy: WFL for tasks assessed/performed Overall Cognitive Status: History of cognitive impairments - at baseline                                        Exercises      General Comments        Pertinent Vitals/Pain Pain Assessment: Faces Faces Pain Scale: Hurts a little bit Pain Location: back Pain Descriptors / Indicators: Grimacing;Guarding Pain Intervention(s): Limited activity within patient's tolerance;Monitored during session;Repositioned  Home Living                      Prior Function            PT Goals (current goals can now be found in the care plan section) Acute Rehab PT Goals Patient Stated Goal: none stated PT Goal Formulation: With patient Time For Goal Achievement: 04/19/21 Potential to Achieve Goals: Good Progress towards PT goals: Progressing toward goals    Frequency    Min  2X/week      PT Plan Current plan remains appropriate    Co-evaluation              AM-PAC PT "6 Clicks" Mobility   Outcome Measure  Help needed turning from your back to your side while in a flat bed without using bedrails?: A Lot Help needed moving from lying on your back to sitting on the side of a flat bed without using bedrails?: A Lot Help needed moving to and from a bed to a chair (including a wheelchair)?: Total Help needed standing up from a chair using your arms (e.g., wheelchair or bedside chair)?: Total Help needed to walk in hospital room?: Total Help needed climbing 3-5 steps with a railing? : Total 6 Click Score: 8    End of Session Equipment Utilized During Treatment: Gait belt Activity Tolerance: Patient limited by pain;Patient limited by fatigue Patient left: in chair;with call bell/phone within reach;with chair alarm set Nurse Communication: Mobility status PT Visit Diagnosis: Unsteadiness on feet (R26.81);Muscle weakness (generalized) (M62.81);Difficulty in walking, not elsewhere classified (R26.2)     Time: 1400-1430 PT Time Calculation (min) (ACUTE ONLY): 30 min  Charges:  $Therapeutic Activity: 23-37 mins                     Rolinda Roan, PT, DPT Acute Rehabilitation Services Pager: [phone] Office: [phone]    Thelma Comp 04/12/2021, 4:08 PM

## 2021-04-12 NOTE — TOC Progression Note (Signed)
Transition of Care [facility]) - Progression Note    Patient Details  Name: [name] MRN: [id number] Date of Birth: 1930-04-05  [facility] CM/SW Contact  Coralee P[place], [place] Phone Number: 04/12/2021, 5:21 PM  Clinical Narrative:    Insurance authorization was initiated and covid requested with the intent to discharge pt tomorrow. [facility] sent a message stating they now may or may not have a bed available and TOC will need to follow up in the morning. TOC will continue to follow for DC needs.   Expected Discharge Plan: [place] Barriers to Discharge: Continued Medical Work up, Ship broker  Expected Discharge Plan and Services Expected Discharge Plan: [place] Choice: [place] arrangements for the past 2 months: Single Family Home                                       Social Determinants of Health (SDOH) Interventions    Readmission Risk Interventions No flowsheet data found.

## 2021-04-12 NOTE — Progress Notes (Signed)
PROGRESS NOTE    [name]  UXL:[id number] DOB: 20-Jun-1930 DOA: 04/04/2021 PCP: Binnie Rail, MD   Brief Narrative: 87 year old with past medical history for paroxysmal A. fib, diabetes, dementia with behavioral disturbance presented emergency department from home after multiple falls and being discovered by her son.  Recent history of basal hallucination, talking to deceased dog, increased confusion, recent severe back pain prior to admission for which outpatient follow-up was planted.  Admitted for UTI, metabolic encephalopathy, mild rhabdomyolysis, multiple falls.  Son requested placement.   Assessment & Plan:   Principal Problem:   UTI (urinary tract infection) Active Problems:   Essential hypertension   Paroxysmal atrial fibrillation (HCC)   Diabetes mellitus without complication (HCC)   Z36 compression fracture (HCC)   DNR (do not resuscitate)   Acute metabolic encephalopathy   Dementia with behavioral disturbance ([place])   Fall at home, initial encounter   Rhabdomyolysis   Pulmonary nodule   Aortic atherosclerosis ([place])   Altered mental status   Palliative care by specialist  1-UTI: Urine Culture from 9/18 growing E. coli pansensitive. She has received 5 days of IV ceftriaxone. Monitor for fever/  Urine retention, earlier during hospitalization.  Per nurse staff, patient has been urinating.   Acute metabolic encephalopathy: Secondary to UTI and complicated by dementia.  CT head negative, no focal deficit. Treated for infection. Stable.   Dementia With behavioral disturbance: Continue with sertraline Delirium Precaution.  Paroxysmal A. fib: HR improved on cardizem Continue with Cardizem to 120 daily.  Continue with metoprolol/.   Rhabdomyolysis: Mild.  Secondary to falls at home.  CK trending down. Received IV fluids.   Fall at home, initial encounter: Multiples at home.  No apparent injury. Needs further discussion in regards continuation of apixaban  in the setting of recurrent fall  T12 compression fracture: Pain control.  PT following  Pulmonary nodule: CT follow-up in 3 months DM without complication: Hemoglobin A1c 6.3 on 10/12/2020. Hypokalemia; replete orally.     Estimated body mass index is 20.07 kg/m as calculated from the following:   Height as of 10/12/20: 5\' 3"  (1.6 m).   Weight as of this encounter: 51.4 kg.   DVT prophylaxis: On Eliquis Code Status: DNR Family Communication: son updated during hospitalization.  Disposition Plan:  Status is: Inpatient  Remains inpatient appropriate because:IV treatments appropriate due to intensity of illness or inability to take PO  Dispo: The patient is from: Home              Anticipated d/c is to: SNF              Patient currently is medically stable to d/c. Awaiting SNF   Difficult to place patient No        Consultants:  None  Procedures:  None  Antimicrobials:    Subjective: She is alert, no new complaints, follows command Objective: Vitals:   04/12/21 0457 04/12/21 0814 04/12/21 1037 04/12/21 1200  BP: (!) 159/84 (!) 166/85 (!) 143/98 (!) 151/81  Pulse: 73 64 79   Resp: 15 16  18   Temp: 98 F (36.7 C) 97.7 F (36.5 C)  97.8 F (36.6 C)  TempSrc: Oral Oral  Axillary  SpO2: 94% 94%  96%  Weight:        Intake/Output Summary (Last 24 hours) at 04/12/2021 1430 Last data filed at 04/12/2021 0841 Gross per 24 hour  Intake 408.71 ml  Output 400 ml  Net 8.71 ml    [employer]  04/07/21 0547 04/08/21 0504  Weight: 50.3 kg 51.4 kg    Examination:  General exam: NAD Respiratory system: CTA Cardiovascular system: S 1, S 2  Gastrointestinal system: BS present, soft, nt Central nervous system: Alert, follows command Extremities: No edema   Data Reviewed: I have personally reviewed following labs and imaging studies  CBC: Recent Labs  Lab 04/10/21 0806  WBC 11.3*  HGB 11.9*  HCT 36.8  MCV 100.0  PLT 782    Basic Metabolic  Panel: Recent Labs  Lab 04/06/21 0322 04/07/21 0729 04/08/21 0657 04/10/21 0806 04/11/21 0807  NA 139 143 143 144 141  K 3.6 3.7 3.8 3.0* 3.9  CL 104 113* 115* 110 110  CO2 20* 19* 20* 26 26  GLUCOSE 163* 154* 124* 121* 123*  BUN 17 22 26* 18 16  CREATININE 0.60 0.69 0.67 0.52 0.59  CALCIUM 9.2 9.2 8.9 8.6* 8.8*  MG  --  1.8  --   --   --     GFR: CrCl cannot be calculated (Unknown ideal weight.). Liver Function Tests: No results for input(s): AST, ALT, ALKPHOS, BILITOT, PROT, ALBUMIN in the last 168 hours.  No results for input(s): LIPASE, AMYLASE in the last 168 hours. No results for input(s): AMMONIA in the last 168 hours. Coagulation Profile: No results for input(s): INR, PROTIME in the last 168 hours.  Cardiac Enzymes: Recent Labs  Lab 04/06/21 0322 04/07/21 0729  CKTOTAL 829* 409*    BNP (last 3 results) No results for input(s): PROBNP in the last 8760 hours. HbA1C: No results for input(s): HGBA1C in the last 72 hours. CBG: No results for input(s): GLUCAP in the last 168 hours. Lipid Profile: No results for input(s): CHOL, HDL, LDLCALC, TRIG, CHOLHDL, LDLDIRECT in the last 72 hours. Thyroid Function Tests: No results for input(s): TSH, T4TOTAL, FREET4, T3FREE, THYROIDAB in the last 72 hours.  Anemia Panel: No results for input(s): VITAMINB12, FOLATE, FERRITIN, TIBC, IRON, RETICCTPCT in the last 72 hours. Sepsis Labs: No results for input(s): PROCALCITON, LATICACIDVEN in the last 168 hours.  Recent Results (from the past 240 hour(s))  Urine Culture     Status: Abnormal   Collection Time: 04/04/21  1:19 PM   Specimen: Urine, Clean Catch  Result Value Ref Range Status   Specimen Description URINE, CLEAN CATCH  Final   Special Requests   Final    NONE Performed at [facility], [address], [place], [place] [address]    Culture >=100,000 COLONIES/mL ESCHERICHIA COLI (A)  Final   Report Status 04/07/2021 FINAL  Final   Organism ID, Bacteria  ESCHERICHIA COLI (A)  Final      Susceptibility   Escherichia coli - MIC*    AMPICILLIN <=2 SENSITIVE Sensitive     CEFAZOLIN <=4 SENSITIVE Sensitive     CEFEPIME <=0.12 SENSITIVE Sensitive     CEFTRIAXONE <=0.25 SENSITIVE Sensitive     CIPROFLOXACIN <=0.25 SENSITIVE Sensitive     GENTAMICIN <=1 SENSITIVE Sensitive     IMIPENEM <=0.25 SENSITIVE Sensitive     NITROFURANTOIN <=16 SENSITIVE Sensitive     TRIMETH/SULFA <=20 SENSITIVE Sensitive     AMPICILLIN/SULBACTAM <=2 SENSITIVE Sensitive     PIP/TAZO <=4 SENSITIVE Sensitive     * >=100,000 COLONIES/mL ESCHERICHIA COLI  Resp Panel by RT-PCR (Flu A&B, Covid) Nasopharyngeal Swab     Status: None   Collection Time: 04/04/21  3:40 PM   Specimen: Nasopharyngeal Swab; Nasopharyngeal(NP) swabs in vial transport medium  Result Value  Ref Range Status   SARS Coronavirus 2 by RT PCR NEGATIVE NEGATIVE Final    Comment: (NOTE) SARS-CoV-2 target nucleic acids are NOT DETECTED.  The SARS-CoV-2 RNA is generally detectable in upper respiratory specimens during the acute phase of infection. The lowest concentration of SARS-CoV-2 viral copies this assay can detect is 138 copies/mL. A negative result does not preclude SARS-Cov-2 infection and should not be used as the sole basis for treatment or other patient management decisions. A negative result may occur with  improper specimen collection/handling, submission of specimen other than nasopharyngeal swab, presence of viral mutation(s) within the areas targeted by this assay, and inadequate number of viral copies(<138 copies/mL). A negative result must be combined with clinical observations, patient history, and epidemiological information. The expected result is Negative.  Fact Sheet for Patients:  EntrepreneurPulse.com.au  Fact Sheet for Healthcare Providers:  IncredibleEmployment.be  This test is no t yet approved or cleared by the [place] FDA and   has been authorized for detection and/or diagnosis of SARS-CoV-2 by FDA under an Emergency Use Authorization (EUA). This EUA will remain  in effect (meaning this test can be used) for the duration of the COVID-19 declaration under Section 564(b)(1) of the Act, 21 U.S.C.section 360bbb-3(b)(1), unless the authorization is terminated  or revoked sooner.       Influenza A by PCR NEGATIVE NEGATIVE Final   Influenza B by PCR NEGATIVE NEGATIVE Final    Comment: (NOTE) The Xpert Xpress SARS-CoV-2/FLU/RSV plus assay is intended as an aid in the diagnosis of influenza from Nasopharyngeal swab specimens and should not be used as a sole basis for treatment. Nasal washings and aspirates are unacceptable for Xpert Xpress SARS-CoV-2/FLU/RSV testing.  Fact Sheet for Patients: EntrepreneurPulse.com.au  Fact Sheet for Healthcare Providers: IncredibleEmployment.be  This test is not yet approved or cleared by the [place] FDA and has been authorized for detection and/or diagnosis of SARS-CoV-2 by FDA under an Emergency Use Authorization (EUA). This EUA will remain in effect (meaning this test can be used) for the duration of the COVID-19 declaration under Section 564(b)(1) of the Act, 21 U.S.C. section 360bbb-3(b)(1), unless the authorization is terminated or revoked.  Performed at [facility], [place] [address] [place], [place] [address]           Radiology Studies: No results found.      Scheduled Meds:  apixaban  2.5 mg Oral BID   diltiazem  120 mg Oral Daily   metoprolol succinate  50 mg Oral BID   sertraline  50 mg Oral Daily   tamsulosin  0.4 mg Oral Daily   Continuous Infusions:     LOS: 8 days    Time spent: 35 minutes.     Elmarie Shiley, MD [facility]   If 7PM-7AM, please contact night-coverage [email or web address]  04/12/2021, 2:30 PM

## 2021-04-13 DIAGNOSIS — N390 Urinary tract infection, site not specified: Secondary | ICD-10-CM | POA: Diagnosis not present

## 2021-04-13 MED ORDER — SENNA 8.6 MG PO TABS: 1.0000 | ORAL_TABLET | Freq: Every day | ORAL | 0 refills | Status: AC

## 2021-04-13 MED ORDER — POLYETHYLENE GLYCOL 3350 17 G PO PACK: 17.0000 g | PACK | Freq: Two times a day (BID) | ORAL | 0 refills | Status: AC

## 2021-04-13 MED ORDER — DILTIAZEM HCL ER COATED BEADS 120 MG PO CP24: 120.0000 mg | ORAL_CAPSULE | Freq: Every day | ORAL | 3 refills | Status: AC

## 2021-04-13 NOTE — TOC Progression Note (Signed)
Transition of Care [facility]) - Progression Note    Patient Details  Name: [name] MRN: [id number] Date of Birth: 1930-05-30  Transition of [facility]) CM/SW Contact  Joanne Chars, LCSW Phone Number: 04/13/2021, 9:51 AM  Clinical Narrative:   Pt auth approved by Everlene Balls: Plan Auth ID: [id number].  3 days 9/27-9/29. CSW informed by Juliann Pulse at [facility] that they do not have a bed until tomorrow.  Covid test is back and negative.  MD informed.      Expected Discharge Plan: [place] Barriers to Discharge: Continued Medical Work up, Ship broker  Expected Discharge Plan and Services Expected Discharge Plan: [place] Choice: [place] arrangements for the past 2 months: Single Family Home                                       Social Determinants of Health (SDOH) Interventions    Readmission Risk Interventions No flowsheet data found.

## 2021-04-13 NOTE — Progress Notes (Signed)
Occupational Therapy Treatment Patient Details Name: [name] MRN: [id number] DOB: January 06, 1930 Today's Date: 04/13/2021   History of present illness Pt is an 87 y/o female admitted secondary to multiple falls and confusion. Thought to be secondary to UTI. Also found to have T8 compression fx to be managed conservatively. PMH includes HTN, a fib, DM, osteoporosis, bladder cancer, and chronic thoracic compression fx.   OT comments  [place] is progressing well. She was min A for bed mobility and mod A for all OOB mobility this session. She continues to only be oriented to self, however follows all commands appropriately. Pt was set up for lunch sitting up in the chair. She continues to benefit from OT acutely. D/c plan remains appropriate.   Recommendations for follow up therapy are one component of a multi-disciplinary discharge planning process, led by the attending physician.  Recommendations may be updated based on patient status, additional functional criteria and insurance authorization.    Follow Up Recommendations  SNF;Supervision/Assistance - 24 hour    Equipment Recommendations  None recommended by OT       Precautions / Restrictions Precautions Precautions: Fall;Back Precaution Booklet Issued: No Precaution Comments: Watch HR; Multiple falls at home Restrictions Weight Bearing Restrictions: No       Mobility Bed Mobility Overal bed mobility: Needs Assistance Bed Mobility: Rolling;Sidelying to Sit;Sit to Sidelying Rolling: Min guard Sidelying to sit: Min assist       General bed mobility comments: min guard-min A for safety due to compression fx. pt followed all commands for technique, min A to adjust hips to EOb with use of chuck    Transfers Overall transfer level: Needs assistance Equipment used: 1 person hand held assist Transfers: Sit to/from [facility] Sit to Stand: Mod assist Stand pivot transfers: Mod assist       General transfer  comment: mod A for transfers given verbal cues for all sequencing    Balance Overall balance assessment: Needs assistance Sitting-balance support: Single extremity supported Sitting balance-Leahy Scale: Fair Sitting balance - Comments: pt able to balance while sitting EOb with feet supported and 1UE supported Postural control: Posterior lean;Left lateral lean Standing balance support: Bilateral upper extremity supported Standing balance-Leahy Scale: Zero               ADL either performed or assessed with clinical judgement   ADL Overall ADL's : Needs assistance/impaired Eating/Feeding: Set up Eating/Feeding Details (indicate cue type and reason): set up for self feeding at the end of the session                 Functional mobility during ADLs: Moderate assistance;Cueing for sequencing;Cueing for safety General ADL Comments: mod A for transfer from bed>chair, pt followed all commands appropriately.      Cognition Arousal/Alertness: Awake/alert Behavior During Therapy: WFL for tasks assessed/performed Overall Cognitive Status: History of cognitive impairments - at baseline           General Comments: oriented to self only, follows all commands appropriately              General Comments VSS on RA    Pertinent Vitals/ Pain       Pain Assessment: No/denies pain Pain Intervention(s): Monitored during session;Limited activity within patient's tolerance;Repositioned   Frequency  Min 2X/week        Progress Toward Goals  OT Goals(current goals can now be found in the care plan section)  Progress towards OT goals: Progressing toward goals  Acute Rehab  OT Goals Patient Stated Goal: none stated OT Goal Formulation: Patient unable to participate in goal setting Time For Goal Achievement: 04/20/21 Potential to Achieve Goals: Fair ADL Goals Pt Will Perform Grooming: with min guard assist;sitting Pt Will Perform Upper Body Bathing: with min guard  assist;sitting Pt Will Perform Lower Body Bathing: with min assist;sit to/from stand Pt Will Perform Upper Body Dressing: with min guard assist;sitting Pt Will Perform Lower Body Dressing: with min assist;sit to/from stand Pt Will Transfer to Toilet: with min guard assist;ambulating;bedside commode Pt Will Perform Toileting - Clothing Manipulation and hygiene: with min guard assist;sitting/lateral leans  Plan Discharge plan remains appropriate;Frequency remains appropriate       AM-PAC OT "6 Clicks" Daily Activity     Outcome Measure   Help from another person eating meals?: A Lot Help from another person taking care of personal grooming?: A Lot Help from another person toileting, which includes using toliet, bedpan, or urinal?: A Lot Help from another person bathing (including washing, rinsing, drying)?: A Lot Help from another person to put on and taking off regular upper body clothing?: A Lot Help from another person to put on and taking off regular lower body clothing?: A Lot 6 Click Score: 12    End of Session Equipment Utilized During Treatment: Gait belt  OT Visit Diagnosis: Unsteadiness on feet (R26.81);Other abnormalities of gait and mobility (R26.89);Muscle weakness (generalized) (M62.81);Other symptoms and signs involving cognitive function;Pain   Activity Tolerance Patient tolerated treatment well   Patient Left in chair;with call bell/phone within reach;with chair alarm set   Nurse Communication Mobility status        Time: 7564-3329 OT Time Calculation (min): 17 min  Charges: OT General Charges $OT Visit: 1 Visit OT Treatments $Therapeutic Activity: 8-22 mins   Ellicia Alix A Shelah Heatley 04/13/2021, 12:48 PM

## 2021-04-13 NOTE — Discharge Summary (Addendum)
Physician Discharge Summary  [name] XLK:[id number] DOB: 1930/01/17 DOA: 04/04/2021  PCP: Binnie Rail, MD  Admit date: 04/04/2021 Discharge date: 04/14/2021  Admitted From: Home  Disposition: SNF  Recommendations for Outpatient Follow-up:  Follow up with PCP in 1-2 weeks Please obtain BMP/CBC in one week Palliative care follow up at facility Further discussion in regards Eliquis use due to history of fall.     Discharge Condition: Stable.  CODE STATUS: DNR Diet recommendation: Heart Healthy   Brief/Interim Summary: 87 year old with past medical history for paroxysmal A. fib, diabetes, dementia with behavioral disturbance presented emergency department from home after multiple falls and being discovered by her son.  Recent history of basal hallucination, talking to deceased dog, increased confusion, recent severe back pain prior to admission for which outpatient follow-up was planted.  Admitted for UTI, metabolic encephalopathy, mild rhabdomyolysis, multiple falls, A fib RVR.  Son requested placement.     1-UTI: Urine Culture from 9/18 growing E. coli pansensitive. She has received 5 days of IV ceftriaxone. Monitor for fever/  Urine retention, earlier during hospitalization. resolved.  Per nurse staff, patient has been urinating.    Acute metabolic encephalopathy: Secondary to UTI and complicated by dementia.  CT head negative, no focal deficit. Treated for infection. Stable.    Dementia With behavioral disturbance: Continue with sertraline Delirium Precaution.   Paroxysmal A. fib: HR improved on cardizem Continue with Cardizem to 120 daily.  Continue with metoprolol/.  On eliquis. Now she is going to be under supervision continue with eliquis. Further discussion with family needed due to frequent fall.    Rhabdomyolysis: Mild.  Secondary to falls at home.  CK trending down. Received IV fluids.    Fall at home, initial encounter: Multiples at home.  No apparent  injury. Needs further discussion in regards continuation of apixaban in the setting of recurrent fall.    T12 compression fracture: Pain control.  PT following   Pulmonary nodule: CT follow-up in 3 months DM without complication: Hemoglobin A1c 6.3 on 10/12/2020. Hypokalemia; replete orally.        Estimated body mass index is 20.07 kg/m as calculated from the following:   Height as of 10/12/20: 5\' 3"  (1.6 m).   Weight as of this encounter: 51.4 kg.      Discharge Diagnoses:  Principal Problem:   UTI (urinary tract infection) Active Problems:   Essential hypertension   Paroxysmal atrial fibrillation (HCC)   Diabetes mellitus without complication ([place])   D66 compression fracture (HCC)   DNR (do not resuscitate)   Acute metabolic encephalopathy   Dementia with behavioral disturbance ([place])   Fall at home, initial encounter   Rhabdomyolysis   Pulmonary nodule   Aortic atherosclerosis ([place])   Altered mental status   Palliative care by specialist    Discharge Instructions   Allergies as of 04/13/2021   No Known Allergies      Medication List     TAKE these medications    acetaminophen 500 MG tablet Commonly known as: TYLENOL Take 500 mg by mouth daily as needed for moderate pain.   apixaban 2.5 MG Tabs tablet Commonly known as: Eliquis Take 1 tablet (2.5 mg total) by mouth 2 (two) times daily.   calcium carbonate 600 MG Tabs tablet Commonly known as: OS-CAL Take 600 mg by mouth 2 (two) times daily.   diltiazem 120 MG 24 hr capsule Commonly known as: CARDIZEM CD Take 1 capsule (120 mg total) by mouth daily.  Fish Oil 1000 MG Caps Take 1,000 mg by mouth daily with lunch.   HYDROcodone-acetaminophen 5-325 MG tablet Commonly known as: NORCO/VICODIN Take 1 tablet by mouth every 12 (twelve) hours as needed for moderate pain. For chronic back pain   Magnesium 400 MG Tabs Take 400 mg by mouth daily with lunch.   metoprolol succinate 50 MG 24 hr  tablet Commonly known as: TOPROL-XL TAKE 1 TABLET (50 MG TOTAL) BY MOUTH 2 (TWO) TIMES DAILY. TAKE WITH OR IMMEDIATELY FOLLOWING A MEAL.   multivitamin-lutein Caps capsule Take 1 capsule by mouth daily with lunch.   polyethylene glycol 17 g packet Commonly known as: MIRALAX / GLYCOLAX Take 17 g by mouth 2 (two) times daily.   senna 8.6 MG Tabs tablet Commonly known as: SENOKOT Take 1 tablet (8.6 mg total) by mouth daily.   sertraline 50 MG tablet Commonly known as: ZOLOFT Take 1 tablet (50 mg total) by mouth daily.   tretinoin 0.05 % cream Commonly known as: RETIN-A Apply 1 application topically daily.   Vitamin D3 Super Strength 50 MCG (2000 UT) Tabs Generic drug: Cholecalciferol Take 2,000 Units by mouth daily with lunch.        No Known Allergies  Consultations: Palliative   Procedures/Studies: DG Tibia/Fibula Right  Result Date: 04/04/2021 CLINICAL DATA:  Fall with bruising and pain EXAM: RIGHT TIBIA AND FIBULA - 2 VIEW COMPARISON:  None. FINDINGS: Mild osteoarthritis of the knee. Bony demineralization. Distal femoral IM nail noted. Atherosclerotic vascular calcifications. Bony demineralization. Degenerative midfoot arthropathy. No tibia/fibular fracture identified. No foreign body noted. IMPRESSION: 1. No acute bony findings. 2. Osteoarthritis of the knee. Degenerative findings in the midfoot. 3. Bony demineralization. 4. Atherosclerosis. Electronically Signed   By: Van Clines M.D.   On: 04/04/2021 16:47   CT Head Wo Contrast  Result Date: 04/04/2021 CLINICAL DATA:  Multiple falls, head trauma EXAM: CT HEAD WITHOUT CONTRAST TECHNIQUE: Contiguous axial images were obtained from the base of the skull through the vertex without intravenous contrast. COMPARISON:  None. FINDINGS: Brain: No acute intracranial hemorrhage. No focal mass lesion. No CT evidence of acute infarction. No midline shift or mass effect. No hydrocephalus. Basilar cisterns are patent. There are  periventricular and subcortical white matter hypodensities. Generalized cortical atrophy. Vascular: No hyperdense vessel or unexpected calcification. Skull: Normal. Negative for fracture or focal lesion. Sinuses/Orbits: Paranasal sinuses and mastoid air cells are clear. Orbits are clear. Other: None. IMPRESSION: 1. No acute intracranial findings. 2. Chronic atrophy and white matter microvascular disease Electronically Signed   By: Suzy Bouchard M.D.   On: 04/04/2021 15:23   CT CHEST WO CONTRAST  Result Date: 04/05/2021 CLINICAL DATA:  Lung nodule follow-up EXAM: CT CHEST WITHOUT CONTRAST TECHNIQUE: Multidetector CT imaging of the chest was performed following the standard protocol without IV contrast. COMPARISON:  CT spine dated April 04, 2021 FINDINGS: Cardiovascular: Cardiomegaly. Small pericardial effusion. Severe atherosclerotic disease of the thoracic aorta three-vessel coronary artery calcifications. Mediastinum/Nodes: Patulous esophagus. No pathologically enlarged lymph nodes seen in the chest. Lungs/Pleura: Central airways are patent small right pleural effusion. Biapical pleural-parenchymal scarring. Evaluation of the lung parenchyma is limited due to respiratory motion artifact. Mild bilateral ground-glass opacities may be due to atelectasis due to expiratory phase of imaging, although evaluation is limited. Nodular opacity of the left lower lobe measuring 1.1 x 0.4 cm on series 15, image 224. No evidence of pneumothorax. Upper Abdomen: No acute abnormality. Musculoskeletal: Compression fractures of the thoracic spine, see recent CT of the  thoracic spine for more detailed discussion. IMPRESSION: Nodular opacity in the left lower lobe measuring up to 1.0 cm, possibly due to focal atelectasis, although a pulmonary nodule not be excluded, respiratory motion artifact somewhat limits evaluation. Recommend follow-up CT in 3 months to ensure resolution. This recommendation follows the consensus  statement: Guidelines for Management of Incidental Pulmonary Nodules Detected on CT Images: From the Fleischner Society 2017; Radiology 2017; 284:228-243. Cardiomegaly and small pericardial effusion. Three-vessel coronary artery calcifications and aortic Atherosclerosis (ICD10-I70.0). Electronically Signed   By: Yetta Glassman M.D.   On: 04/05/2021 09:48   CT Cervical Spine Wo Contrast  Result Date: 04/04/2021 CLINICAL DATA:  Neck trauma (Age >= 65y).  Multiple recent falls. EXAM: CT CERVICAL SPINE WITHOUT CONTRAST TECHNIQUE: Multidetector CT imaging of the cervical spine was performed without intravenous contrast. Multiplanar CT image reconstructions were also generated. COMPARISON:  11/24/2020 FINDINGS: Alignment: Chronic reversal the normal cervical lordosis with grade 1 anterolisthesis of C3 on C4 and grade 1 retrolisthesis of C4 on C5 and C5 on C6, unchanged. Mild left convex curvature of the cervical spine. Skull base and vertebrae: No acute fracture or suspicious osseous lesion. Soft tissues and spinal canal: No prevertebral fluid or swelling. No visible canal hematoma. Disc levels: Unchanged advanced disc space narrowing from C3-4 to C5-6 with degenerative endplate changes, greatest at C5-6. Multilevel facet arthrosis, severe on the left at C3-4. No evidence of high-grade spinal stenosis. Moderate to severe neural foraminal stenosis on the left at C3-4 and on the right at C5-6. Upper chest: Biapical pleuroparenchymal lung scarring with calcification. Other: None. IMPRESSION: 1. No acute cervical spine fracture. 2. Unchanged cervical advanced disc and facet degeneration. Electronically Signed   By: Logan Bores M.D.   On: 04/04/2021 18:03   CT Thoracic Spine Wo Contrast  Result Date: 04/04/2021 CLINICAL DATA:  Back trauma, no prior imaging (Age >= 16y). Multiple recent falls. EXAM: CT THORACIC SPINE WITHOUT CONTRAST TECHNIQUE: Multidetector CT images of the thoracic were obtained using the standard  protocol without intravenous contrast. COMPARISON:  Chest radiographs 03/29/2018 FINDINGS: Alignment: Mild S-shaped thoracic scoliosis. No significant listhesis. Vertebrae: There is a T8 vertebral body fracture with compression of the superior and inferior endplates resulting in 81% vertebral body height loss. Detailed characterization is limited by motion through this region, however the fracture appears recent/unhealed with a visible fracture line and mild sclerosis which could reflect early healing or trabecular impaction. There is no retropulsion, and no definite posterior element fracture is identified within limitations of motion. A T11 inferior endplate compression fracture with mild vertebral body height loss is chronic and unchanged from 2019, and there is also a chronic, previously augmented T12 compression fracture with 4 mm retropulsion of the posterosuperior vertebral body. Paraspinal and other soft tissues: Biapical pleuroparenchymal lung scarring. Small right pleural effusion. Aortic and coronary atherosclerosis. Disc levels: T12 retropulsion results in mild spinal stenosis. IMPRESSION: 1. Recent/unhealed T8 vertebral body fracture with 45% height loss. No retropulsion or definite posterior element fracture identified within limitations of motion. 2. Chronic T11 and T12 compression fractures. 3. Small right pleural effusion. 4. Aortic Atherosclerosis (ICD10-I70.0). Electronically Signed   By: Logan Bores M.D.   On: 04/04/2021 18:25   CT Lumbar Spine Wo Contrast  Result Date: 04/04/2021 CLINICAL DATA:  Back trauma, no prior imaging (Age >= 16y). Multiple recent falls. EXAM: CT LUMBAR SPINE WITHOUT CONTRAST TECHNIQUE: Multidetector CT imaging of the lumbar spine was performed without intravenous contrast administration. Multiplanar CT image reconstructions were  also generated. COMPARISON:  CT abdomen and pelvis 09/26/2008 FINDINGS: Segmentation: The lowest lumbar type vertebra is L5. There are no  ribs at T12. Alignment: Severe dextroscoliosis with apex at L2. trace retrolisthesis of L2 on L3. Vertebrae: No acute fracture or suspicious osseous lesion. Angular deformity of the right L1 transverse process suggestive of a remote, healed fracture. Paraspinal and other soft tissues: Small right pleural effusion. Abdominal aortic atherosclerosis without aneurysm. Disc levels: Advanced disc degeneration from L1-2 to L4-5 with severe disc space narrowing and prominent degenerative endplate changes. Solid bridging left lateral vertebral osteophyte at L1-2. Moderate to severe spinal stenosis at L3-4, L4-5, and L5-S1 due to disc bulging and posterior element hypertrophy. Moderate to severe neural foraminal stenosis on the left at L3-4 and on the right at L4-5. IMPRESSION: 1. No evidence of acute osseous abnormality. 2. Severe lumbar dextroscoliosis with advanced disc and facet degeneration. 3. Moderate to severe spinal stenosis at L3-4, L4-5, and L5-S1. 4. Aortic Atherosclerosis (ICD10-I70.0). Electronically Signed   By: Logan Bores M.D.   On: 04/04/2021 18:34   DG Pelvis Portable  Result Date: 04/04/2021 CLINICAL DATA:  Fall. EXAM: PORTABLE PELVIS 1-2 VIEWS COMPARISON:  January 14, 2018 FINDINGS: A gamma nail and femoral rod across a previously identified intertrochanteric fracture. No definitive acute fracture identified. IMPRESSION: No acute fracture noted. Electronically Signed   By: Dorise Bullion III M.D.   On: 04/04/2021 16:33   DG Chest Portable 1 View  Result Date: 04/04/2021 CLINICAL DATA:  Fall EXAM: PORTABLE CHEST 1 VIEW COMPARISON:  Chest radiograph 06/18/2019 FINDINGS: The heart is enlarged, slightly progressed since 2020. There is calcified atherosclerotic plaque of the aortic arch. The mediastinal contours are otherwise within normal limits. There is a 1.9 cm nodular opacity in the right lower lobe not present on the prior study. There is no other focal consolidation or pulmonary edema. There is  no pleural effusion or pneumothorax. There is biapical pleural scarring, right more than left. There is no acute osseous abnormality. Post kyphoplasty changes are noted in the upper lumbar spine. IMPRESSION: 1. 1.9 cm nodular opacity in the right lower lobe not present in 2020. Recommend nonemergent chest CT to evaluate for parenchymal nodule. 2. Cardiomegaly which appears slightly progressed since 2020. Electronically Signed   By: Valetta Mole M.D.   On: 04/04/2021 16:18     Subjective: She is alert, pleasantly confuse  Discharge Exam: Vitals:   04/13/21 1031 04/13/21 1216  BP: 116/85 126/72  Pulse: 84 98  Resp:  17  Temp:  98 F (36.7 C)  SpO2:  98%     General: Pt is alert, awake, not in acute distress Cardiovascular: RRR, S1/S2 +, no rubs, no gallops Respiratory: CTA bilaterally, no wheezing, no rhonchi Abdominal: Soft, NT, ND, bowel sounds + Extremities: no edema, no cyanosis    The results of significant diagnostics from this hospitalization (including imaging, microbiology, ancillary and laboratory) are listed below for reference.     Microbiology: Recent Results (from the past 240 hour(s))  Urine Culture     Status: Abnormal   Collection Time: 04/04/21  1:19 PM   Specimen: Urine, Clean Catch  Result Value Ref Range Status   Specimen Description URINE, CLEAN CATCH  Final   Special Requests   Final    NONE Performed at [facility], [address] [place], [place] [address]    Culture >=100,000 COLONIES/mL ESCHERICHIA COLI (A)  Final   Report Status 04/07/2021 FINAL  Final  Organism ID, Bacteria ESCHERICHIA COLI (A)  Final      Susceptibility   Escherichia coli - MIC*    AMPICILLIN <=2 SENSITIVE Sensitive     CEFAZOLIN <=4 SENSITIVE Sensitive     CEFEPIME <=0.12 SENSITIVE Sensitive     CEFTRIAXONE <=0.25 SENSITIVE Sensitive     CIPROFLOXACIN <=0.25 SENSITIVE Sensitive     GENTAMICIN <=1 SENSITIVE Sensitive     IMIPENEM <=0.25 SENSITIVE Sensitive      NITROFURANTOIN <=16 SENSITIVE Sensitive     TRIMETH/SULFA <=20 SENSITIVE Sensitive     AMPICILLIN/SULBACTAM <=2 SENSITIVE Sensitive     PIP/TAZO <=4 SENSITIVE Sensitive     * >=100,000 COLONIES/mL ESCHERICHIA COLI  Resp Panel by RT-PCR (Flu A&B, Covid) Nasopharyngeal Swab     Status: None   Collection Time: 04/04/21  3:40 PM   Specimen: Nasopharyngeal Swab; Nasopharyngeal(NP) swabs in vial transport medium  Result Value Ref Range Status   SARS Coronavirus 2 by RT PCR NEGATIVE NEGATIVE Final    Comment: (NOTE) SARS-CoV-2 target nucleic acids are NOT DETECTED.  The SARS-CoV-2 RNA is generally detectable in upper respiratory specimens during the acute phase of infection. The lowest concentration of SARS-CoV-2 viral copies this assay can detect is 138 copies/mL. A negative result does not preclude SARS-Cov-2 infection and should not be used as the sole basis for treatment or other patient management decisions. A negative result may occur with  improper specimen collection/handling, submission of specimen other than nasopharyngeal swab, presence of viral mutation(s) within the areas targeted by this assay, and inadequate number of viral copies(<138 copies/mL). A negative result must be combined with clinical observations, patient history, and epidemiological information. The expected result is Negative.  Fact Sheet for Patients:  EntrepreneurPulse.com.au  Fact Sheet for Healthcare Providers:  IncredibleEmployment.be  This test is no t yet approved or cleared by the [place] FDA and  has been authorized for detection and/or diagnosis of SARS-CoV-2 by FDA under an Emergency Use Authorization (EUA). This EUA will remain  in effect (meaning this test can be used) for the duration of the COVID-19 declaration under Section 564(b)(1) of the Act, 21 U.S.C.section 360bbb-3(b)(1), unless the authorization is terminated  or revoked sooner.        Influenza A by PCR NEGATIVE NEGATIVE Final   Influenza B by PCR NEGATIVE NEGATIVE Final    Comment: (NOTE) The Xpert Xpress SARS-CoV-2/FLU/RSV plus assay is intended as an aid in the diagnosis of influenza from Nasopharyngeal swab specimens and should not be used as a sole basis for treatment. Nasal washings and aspirates are unacceptable for Xpert Xpress SARS-CoV-2/FLU/RSV testing.  Fact Sheet for Patients: EntrepreneurPulse.com.au  Fact Sheet for Healthcare Providers: IncredibleEmployment.be  This test is not yet approved or cleared by the [place] FDA and has been authorized for detection and/or diagnosis of SARS-CoV-2 by FDA under an Emergency Use Authorization (EUA). This EUA will remain in effect (meaning this test can be used) for the duration of the COVID-19 declaration under Section 564(b)(1) of the Act, 21 U.S.C. section 360bbb-3(b)(1), unless the authorization is terminated or revoked.  Performed at [facility], [place] [address] [place], [place] [address]   SARS CORONAVIRUS 2 (TAT 6-24 HRS) Nasopharyngeal Nasopharyngeal Swab     Status: None   Collection Time: 04/12/21  4:08 PM   Specimen: Nasopharyngeal Swab  Result Value Ref Range Status   SARS Coronavirus 2 NEGATIVE NEGATIVE Final    Comment: (NOTE) SARS-CoV-2 target nucleic acids are NOT DETECTED.  The  SARS-CoV-2 RNA is generally detectable in upper and lower respiratory specimens during the acute phase of infection. Negative results do not preclude SARS-CoV-2 infection, do not rule out co-infections with other pathogens, and should not be used as the sole basis for treatment or other patient management decisions. Negative results must be combined with clinical observations, patient history, and epidemiological information. The expected result is Negative.  Fact Sheet for Patients: SugarRoll.be  Fact Sheet for Healthcare  Providers: https://www.woods-mathews.com/  This test is not yet approved or cleared by the [place] FDA and  has been authorized for detection and/or diagnosis of SARS-CoV-2 by FDA under an Emergency Use Authorization (EUA). This EUA will remain  in effect (meaning this test can be used) for the duration of the COVID-19 declaration under Se ction 564(b)(1) of the Act, 21 U.S.C. section 360bbb-3(b)(1), unless the authorization is terminated or revoked sooner.  Performed at [facility], [place] [address] [place], [place] [address]      Labs: BNP (last 3 results) No results for input(s): BNP in the last 8760 hours. Basic Metabolic Panel: Recent Labs  Lab 04/07/21 0729 04/08/21 0657 04/10/21 0806 04/11/21 0807  NA 143 143 144 141  K 3.7 3.8 3.0* 3.9  CL 113* 115* 110 110  CO2 19* 20* 26 26  GLUCOSE 154* 124* 121* 123*  BUN 22 26* 18 16  CREATININE 0.69 0.67 0.52 0.59  CALCIUM 9.2 8.9 8.6* 8.8*  MG 1.8  --   --   --    Liver Function Tests: No results for input(s): AST, ALT, ALKPHOS, BILITOT, PROT, ALBUMIN in the last 168 hours. No results for input(s): LIPASE, AMYLASE in the last 168 hours. No results for input(s): AMMONIA in the last 168 hours. CBC: Recent Labs  Lab 04/10/21 0806  WBC 11.3*  HGB 11.9*  HCT 36.8  MCV 100.0  PLT 255   Cardiac Enzymes: Recent Labs  Lab 04/07/21 0729  CKTOTAL 409*   BNP: Invalid input(s): POCBNP CBG: No results for input(s): GLUCAP in the last 168 hours. D-Dimer No results for input(s): DDIMER in the last 72 hours. Hgb A1c No results for input(s): HGBA1C in the last 72 hours. Lipid Profile No results for input(s): CHOL, HDL, LDLCALC, TRIG, CHOLHDL, LDLDIRECT in the last 72 hours. Thyroid function studies No results for input(s): TSH, T4TOTAL, T3FREE, THYROIDAB in the last 72 hours.  Invalid input(s): FREET3 Anemia work up No results for input(s): VITAMINB12, FOLATE, FERRITIN, TIBC, IRON, RETICCTPCT  in the last 72 hours. Urinalysis    Component Value Date/Time   COLORURINE YELLOW 04/04/2021 1343   APPEARANCEUR CLEAR 04/04/2021 1343   LABSPEC >1.030 (H) 04/04/2021 1343   PHURINE 6.0 04/04/2021 1343   GLUCOSEU NEGATIVE 04/04/2021 1343   GLUCOSEU NEGATIVE 11/13/2017 1102   HGBUR LARGE (A) 04/04/2021 1343   HGBUR negative 09/10/2008 0752   BILIRUBINUR NEGATIVE 04/04/2021 1343   BILIRUBINUR negative 12/21/2018 1101   KETONESUR 15 (A) 04/04/2021 1343   PROTEINUR 100 (A) 04/04/2021 1343   UROBILINOGEN negative (A) 12/21/2018 1101   UROBILINOGEN 0.2 11/13/2017 1102   NITRITE POSITIVE (A) 04/04/2021 1343   LEUKOCYTESUR SMALL (A) 04/04/2021 1343   Sepsis Labs Invalid input(s): PROCALCITONIN,  WBC,  LACTICIDVEN Microbiology Recent Results (from the past 240 hour(s))  Urine Culture     Status: Abnormal   Collection Time: 04/04/21  1:19 PM   Specimen: Urine, Clean Catch  Result Value Ref Range Status   Specimen Description URINE, CLEAN CATCH  Final  Special Requests   Final    NONE Performed at [facility], [place] [address], [place], [place] [address]    Culture >=100,000 COLONIES/mL ESCHERICHIA COLI (A)  Final   Report Status 04/07/2021 FINAL  Final   Organism ID, Bacteria ESCHERICHIA COLI (A)  Final      Susceptibility   Escherichia coli - MIC*    AMPICILLIN <=2 SENSITIVE Sensitive     CEFAZOLIN <=4 SENSITIVE Sensitive     CEFEPIME <=0.12 SENSITIVE Sensitive     CEFTRIAXONE <=0.25 SENSITIVE Sensitive     CIPROFLOXACIN <=0.25 SENSITIVE Sensitive     GENTAMICIN <=1 SENSITIVE Sensitive     IMIPENEM <=0.25 SENSITIVE Sensitive     NITROFURANTOIN <=16 SENSITIVE Sensitive     TRIMETH/SULFA <=20 SENSITIVE Sensitive     AMPICILLIN/SULBACTAM <=2 SENSITIVE Sensitive     PIP/TAZO <=4 SENSITIVE Sensitive     * >=100,000 COLONIES/mL ESCHERICHIA COLI  Resp Panel by RT-PCR (Flu A&B, Covid) Nasopharyngeal Swab     Status: None   Collection Time: 04/04/21  3:40 PM   Specimen:  Nasopharyngeal Swab; Nasopharyngeal(NP) swabs in vial transport medium  Result Value Ref Range Status   SARS Coronavirus 2 by RT PCR NEGATIVE NEGATIVE Final    Comment: (NOTE) SARS-CoV-2 target nucleic acids are NOT DETECTED.  The SARS-CoV-2 RNA is generally detectable in upper respiratory specimens during the acute phase of infection. The lowest concentration of SARS-CoV-2 viral copies this assay can detect is 138 copies/mL. A negative result does not preclude SARS-Cov-2 infection and should not be used as the sole basis for treatment or other patient management decisions. A negative result may occur with  improper specimen collection/handling, submission of specimen other than nasopharyngeal swab, presence of viral mutation(s) within the areas targeted by this assay, and inadequate number of viral copies(<138 copies/mL). A negative result must be combined with clinical observations, patient history, and epidemiological information. The expected result is Negative.  Fact Sheet for Patients:  EntrepreneurPulse.com.au  Fact Sheet for Healthcare Providers:  IncredibleEmployment.be  This test is no t yet approved or cleared by the [place] FDA and  has been authorized for detection and/or diagnosis of SARS-CoV-2 by FDA under an Emergency Use Authorization (EUA). This EUA will remain  in effect (meaning this test can be used) for the duration of the COVID-19 declaration under Section 564(b)(1) of the Act, 21 U.S.C.section 360bbb-3(b)(1), unless the authorization is terminated  or revoked sooner.       Influenza A by PCR NEGATIVE NEGATIVE Final   Influenza B by PCR NEGATIVE NEGATIVE Final    Comment: (NOTE) The Xpert Xpress SARS-CoV-2/FLU/RSV plus assay is intended as an aid in the diagnosis of influenza from Nasopharyngeal swab specimens and should not be used as a sole basis for treatment. Nasal washings and aspirates are unacceptable for  Xpert Xpress SARS-CoV-2/FLU/RSV testing.  Fact Sheet for Patients: EntrepreneurPulse.com.au  Fact Sheet for Healthcare Providers: IncredibleEmployment.be  This test is not yet approved or cleared by the [place] FDA and has been authorized for detection and/or diagnosis of SARS-CoV-2 by FDA under an Emergency Use Authorization (EUA). This EUA will remain in effect (meaning this test can be used) for the duration of the COVID-19 declaration under Section 564(b)(1) of the Act, 21 U.S.C. section 360bbb-3(b)(1), unless the authorization is terminated or revoked.  Performed at [facility], [place] [address] [place], [place] [address]   SARS CORONAVIRUS 2 (TAT 6-24 HRS) Nasopharyngeal Nasopharyngeal Swab  Status: None   Collection Time: 04/12/21  4:08 PM   Specimen: Nasopharyngeal Swab  Result Value Ref Range Status   SARS Coronavirus 2 NEGATIVE NEGATIVE Final    Comment: (NOTE) SARS-CoV-2 target nucleic acids are NOT DETECTED.  The SARS-CoV-2 RNA is generally detectable in upper and lower respiratory specimens during the acute phase of infection. Negative results do not preclude SARS-CoV-2 infection, do not rule out co-infections with other pathogens, and should not be used as the sole basis for treatment or other patient management decisions. Negative results must be combined with clinical observations, patient history, and epidemiological information. The expected result is Negative.  Fact Sheet for Patients: SugarRoll.be  Fact Sheet for Healthcare Providers: https://www.woods-mathews.com/  This test is not yet approved or cleared by the [place] FDA and  has been authorized for detection and/or diagnosis of SARS-CoV-2 by FDA under an Emergency Use Authorization (EUA). This EUA will remain  in effect (meaning this test can be used) for the duration of the COVID-19 declaration under  Se ction 564(b)(1) of the Act, 21 U.S.C. section 360bbb-3(b)(1), unless the authorization is terminated or revoked sooner.  Performed at [facility], [place] [address], [place], [place] [address]      Time coordinating discharge: 40 minutes  SIGNED:   Elmarie Shiley, MD  [facility]

## 2021-04-14 ENCOUNTER — Telehealth: Payer: Self-pay

## 2021-04-14 LAB — GLUCOSE, CAPILLARY: Glucose-Capillary: 152 mg/dL — ABNORMAL HIGH (ref 70–99)

## 2021-04-14 NOTE — Progress Notes (Signed)
Patient seen and examined, continues to be pleasantly confused. She has  a bed available at skilled nursing facility.  Discharge summary completed.  See discharge summary done by Dr. Tyrell Antonio.

## 2021-04-14 NOTE — Plan of Care (Signed)
  Problem: Education: Goal: Knowledge of General Education information will improve Description: Including pain rating scale, medication(s)/side effects and non-pharmacologic comfort measures Outcome: Adequate for Discharge   Problem: Health Behavior/Discharge Planning: Goal: Ability to manage health-related needs will improve Outcome: Adequate for Discharge   Problem: Clinical Measurements: Goal: Ability to maintain clinical measurements within normal limits will improve Outcome: Adequate for Discharge Goal: Will remain free from infection Outcome: Adequate for Discharge Goal: Respiratory complications will improve Outcome: Adequate for Discharge   Problem: Activity: Goal: Risk for activity intolerance will decrease Outcome: Adequate for Discharge   Problem: Nutrition: Goal: Adequate nutrition will be maintained Outcome: Adequate for Discharge   Problem: Coping: Goal: Level of anxiety will decrease Outcome: Adequate for Discharge   Problem: Pain Managment: Goal: General experience of comfort will improve Outcome: Adequate for Discharge   Problem: Safety: Goal: Ability to remain free from injury will improve Outcome: Adequate for Discharge   Problem: Skin Integrity: Goal: Risk for impaired skin integrity will decrease Outcome: Adequate for Discharge

## 2021-04-14 NOTE — Telephone Encounter (Signed)
Prolia VOB initiated via [email or web address]  Last OV:  Next OV:  Last Prolia inj: 07/03/20 Next Prolia inj DUE: 01/02/21

## 2021-04-14 NOTE — TOC Progression Note (Signed)
Transition of Care [facility]) - Progression Note    Patient Details  Name: [name] MRN: [id number] Date of Birth: Mar 23, 1930  Transition of Care [facility]) CM/SW [place], LCSW Phone Number: 04/14/2021, 11:36 AM  Clinical Narrative:    [facility] able to accept patient today. CSW updated son who is in agreement with [facility] for transport and requested [facility] [place] for outpatient palliative follow up. CSW sent referral to [facility].    Expected Discharge Plan: [place] Barriers to Discharge: Continued Medical Work up, Ship broker  Expected Discharge Plan and Services Expected Discharge Plan: [place] Choice: [place] arrangements for the past 2 months: Single Family Home Expected Discharge Date: 04/14/21                                     Social Determinants of Health (SDOH) Interventions    Readmission Risk Interventions No flowsheet data found.

## 2021-04-14 NOTE — Progress Notes (Signed)
Called report to [place] at [facility]

## 2021-04-16 ENCOUNTER — Other Ambulatory Visit: Payer: Self-pay

## 2021-04-16 ENCOUNTER — Non-Acute Institutional Stay: Payer: Medicare Other | Admitting: Hospice

## 2021-04-16 DIAGNOSIS — G9341 Metabolic encephalopathy: Secondary | ICD-10-CM

## 2021-04-16 DIAGNOSIS — Z515 Encounter for palliative care: Secondary | ICD-10-CM

## 2021-04-16 DIAGNOSIS — R296 Repeated falls: Secondary | ICD-10-CM

## 2021-04-16 DIAGNOSIS — R531 Weakness: Secondary | ICD-10-CM

## 2021-04-16 NOTE — Progress Notes (Signed)
[facility] Consult Note Telephone: [phone]  Fax: [phone]  PATIENT NAME: [name] [address] [place] [address] [phone] (home)  DOB: 12/04/1929 MRN: [id number]  PRIMARY CARE PROVIDER:    Binnie Rail, MD,  [place] [address] [phone]  REFERRING PROVIDER:   Dr. Garwin Brothers  RESPONSIBLE PARTY:   Self living staff [name] this is [facility] palliative nurse practitioner saw your mom today she is not she is she is currently at the end [place]     Name Relation Home Work [place] [phone]  [phone]   [facility] Daughter [phone]     Lillias, Difrancesco   [phone]        I met face to face with patient at facility. Palliative Care was asked to follow this patient by consultation request of Dr. Bertram Gala to address advance care planning, complex medical decision making and goals of care clarification.  NP called [name] and updated him on visit for which he expressed appreciation.  This is the initial visit.    ASSESSMENT AND / RECOMMENDATIONS:   Advance Care Planning: Our advance care planning conversation included a discussion about:    The value and importance of advance care planning  Difference between Hospice and Palliative care Exploration of goals of care in the event of a sudden injury or illness  Identification and preparation of a healthcare agent  Review and updating or creation of an  advance directive document . Decision not to resuscitate or to de-escalate disease focused treatments due to poor prognosis.  CODE STATUS: Patient is a DO NOT RESUSCITATE  Goals of Care: Goals include to maximize quality of life and symptom management  I spent  20 minutes providing this initial consultation. More than 50% of the time in this consultation was spent on counseling patient and coordinating  communication. --------------------------------------------------------------------------------------------------------------------------------------  Symptom Management/Plan: Metabolic encephalopathy: Completed antibiotics for UTI.  Patient with some confusion related to underlying dementia.  Continue ongoing supportive care. Type 2 diabetes mellitus: Diet controlled. A1c 6.3 on 10/12/2020.  Repeat A1c, CBC BMP. Weakness: PT/OT in process. Falls: Falls precautions.  PT/OT for strengthening and gait training.  Follow up: Palliative care will continue to follow for complex medical decision making, advance care planning, and clarification of goals. Return 6 weeks or prn.Encouraged to call provider sooner with any concerns.   Family /Caregiver/Community Supports: Patient at SNF for ongoing care  HOSPICE ELIGIBILITY/DIAGNOSIS: TBD  Chief Complaint: Initial Palliative care visit  HISTORY OF PRESENT ILLNESS:  [name] is a 87 y.o. year old female  with multiple medical conditions including acute metabolic encephalopathy, complicated by advanced dementia. Hospitalization 9/18 - 6/75/4492 for metabolic encephalopathy related to UTI; patient was treated and discharged to SNF for acute rehab.  Metabolic encephalopathy impairs her independence, worse as the day progresses.  Patient completed antibiotics which was helpful.  History of A. fib, multiple falls, dementia, diabetes mellitus.  Patient denied pain/discomfort, poor historian due to dementia. History obtained from review of EMR, discussion with primary team, caregiver, family and/or Ms. Murrell.  Review and summarization of Epic records shows history from other than patient. Rest of 10 point ROS asked and negative.     Review of lab tests/diagnostics   Recent Labs  Lab 04/10/21 0806  WBC 11.3*  HGB 11.9*  HCT 36.8  PLT 255  MCV 100.0   Recent Labs  Lab  04/10/21 0806 04/11/21 0807  NA 144 141  K 3.0* 3.9  CL 110 110  CO2 26 26   BUN 18 16  CREATININE 0.52 0.59  GLUCOSE 121* 123*    Physical Exam: Constitutional: NAD General: Well groomed, cooperative EYES: anicteric sclera, lids intact, no discharge  ENMT: Moist mucous membrane CV: S1 S2, RRR, no LE edema Pulmonary: LCTA, no increased work of breathing, no cough, Abdomen: active BS + 4 quadrants, soft and non tender GU: no suprapubic tenderness MSK: weakness, sarcopenia, limited ROM Skin: warm and dry, no rashes or wounds on visible skin Neuro:  weakness, otherwise non focal, confused, memory loss Psych: non-anxious affect Hem/lymph/immuno: no widespread bruising   PAST MEDICAL HISTORY:  Active Ambulatory Problems    Diagnosis Date Noted   Vitamin D deficiency 03/10/2009   Macular degeneration (senile) of retina 06/14/2010   Essential hypertension 08/09/2007   Paroxysmal atrial fibrillation ([place]) 08/09/2007   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) 89/37/3428   NEOPLASM, MALIGNANT, BLADDER, HX OF 03/10/2009   Femur fracture, right ([place]) 01/14/2018   T12 compression fracture ([place]) 04/24/2018   Memory difficulties 12/21/2018   Acute cystitis without hematuria 12/21/2018   Syncope 06/17/2019   DNR (do not resuscitate) 06/19/2019   Hypomagnesemia 07/01/2019   Hypophosphatemia 07/01/2019   Tinea corporis 09/09/2019   B12 deficiency 01/01/2020   Jaw pain 02/14/2020   Pseudophakia 03/05/2020   Advanced nonexudative age-related macular degeneration of both eyes with subfoveal involvement 03/05/2020   Anxiety and depression 04/14/2020   Chronic back pain 10/11/2020   UTI (urinary tract infection) 76/81/1572   Acute metabolic encephalopathy 62/09/5595   Dementia with behavioral disturbance ([place]) 04/05/2021   Fall at home, initial encounter 04/05/2021   Rhabdomyolysis 04/05/2021   Pulmonary nodule 04/05/2021   Aortic atherosclerosis ([place]) 04/05/2021   Altered mental status     Palliative care by specialist    Resolved Ambulatory Problems    Diagnosis Date Noted   Paroxysmal ventricular tachycardia ([place]) 08/09/2007   LUMBAR RADICULOPATHY, RIGHT 05/27/2008   POSTMENOPAUSAL STATUS 01/23/2009   Hyponatremia 02/13/2012   Dysuria 10/12/2017   Hip fracture ([place]) 01/14/2018   Atrial fibrillation with RVR (HCC) 41/63/8453   Metabolic acidosis 64/68/0321   Infected skin tear 09/09/2019   Past Medical History:  Diagnosis Date   Anemia    Arthritis    Atrial fibrillation (HCC)    Cancer (HCC)    Cardiac arrhythmia due to congenital heart disease    History of blood transfusion 01/15/2018   Hypertension    Macular degeneration, bilateral    Other abnormal glucose 06/14/12    SOCIAL HX:  Social History   Tobacco Use   Smoking status: Never   Smokeless tobacco: Never  Substance Use Topics   Alcohol use: Never    Alcohol/week: 0.0 standard drinks     FAMILY HX:  Family History  Problem Relation Age of Onset   Stroke Mother 60   Heart attack Father 11   Coronary artery disease Brother        S/P CBAG ? @ 44   Diabetes Brother    Hypertension Brother    Kidney disease Neg Hx    Cancer Neg Hx    Colon cancer Neg Hx    Colon polyps Neg Hx       ALLERGIES: No Known Allergies    PERTINENT MEDICATIONS:  Outpatient Encounter Medications as of 04/16/2021  Medication Sig   acetaminophen (  TYLENOL) 500 MG tablet Take 500 mg by mouth daily as needed for moderate pain.    apixaban (ELIQUIS) 2.5 MG TABS tablet Take 1 tablet (2.5 mg total) by mouth 2 (two) times daily.   calcium carbonate (OS-CAL) 600 MG TABS tablet Take 600 mg by mouth 2 (two) times daily.    Cholecalciferol (VITAMIN D3 SUPER STRENGTH) 50 MCG (2000 UT) TABS Take 2,000 Units by mouth daily with lunch.   diltiazem (CARDIZEM CD) 120 MG 24 hr capsule Take 1 capsule (120 mg total) by mouth daily.   HYDROcodone-acetaminophen (NORCO/VICODIN) 5-325 MG tablet Take 1 tablet by mouth every 12 (twelve)  hours as needed for moderate pain. For chronic back pain   Magnesium 400 MG TABS Take 400 mg by mouth daily with lunch.   metoprolol succinate (TOPROL-XL) 50 MG 24 hr tablet TAKE 1 TABLET (50 MG TOTAL) BY MOUTH 2 (TWO) TIMES DAILY. TAKE WITH OR IMMEDIATELY FOLLOWING A MEAL.   multivitamin-lutein (OCUVITE-LUTEIN) CAPS capsule Take 1 capsule by mouth daily with lunch.    Omega-3 Fatty Acids (FISH OIL) 1000 MG CAPS Take 1,000 mg by mouth daily with lunch.    polyethylene glycol (MIRALAX / GLYCOLAX) 17 g packet Take 17 g by mouth 2 (two) times daily.   senna (SENOKOT) 8.6 MG TABS tablet Take 1 tablet (8.6 mg total) by mouth daily.   sertraline (ZOLOFT) 50 MG tablet Take 1 tablet (50 mg total) by mouth daily.   tretinoin (RETIN-A) 0.05 % cream Apply 1 application topically daily.    No facility-administered encounter medications on file as of 04/16/2021.     Thank you for the opportunity to participate in the care of Ms. Pinera.  The palliative care team will continue to follow. Please call our office at [phone] if we can be of additional assistance.   Note: Portions of this note were generated with Lobbyist. Dictation errors may occur despite best attempts at proofreading.  Teodoro Spray, NP

## 2021-04-21 NOTE — Telephone Encounter (Signed)
Prior Authorization required for Prolia.   PA PROCESS DETAILS: Please complete the prior authorization form located at [email or web address] or call [phone]

## 2021-04-29 ENCOUNTER — Emergency Department (HOSPITAL_COMMUNITY): Payer: Medicare Other

## 2021-04-29 ENCOUNTER — Inpatient Hospital Stay (HOSPITAL_COMMUNITY)
Admission: EM | Admit: 2021-04-29 | Discharge: 2021-05-07 | DRG: 291 | Disposition: A | Discharge status: Hospice | Payer: Medicare Other | Source: Skilled Nursing Facility | Attending: Internal Medicine | Admitting: Internal Medicine

## 2021-04-29 ENCOUNTER — Other Ambulatory Visit: Payer: Self-pay

## 2021-04-29 DIAGNOSIS — W19XXXA Unspecified fall, initial encounter: Secondary | ICD-10-CM

## 2021-04-29 DIAGNOSIS — F03918 Unspecified dementia, unspecified severity, with other behavioral disturbance: Secondary | ICD-10-CM

## 2021-04-29 DIAGNOSIS — I4891 Unspecified atrial fibrillation: Secondary | ICD-10-CM

## 2021-04-29 DIAGNOSIS — Z66 Do not resuscitate: Secondary | ICD-10-CM

## 2021-04-29 DIAGNOSIS — J9601 Acute respiratory failure with hypoxia: Principal | ICD-10-CM

## 2021-04-29 DIAGNOSIS — I10 Essential (primary) hypertension: Secondary | ICD-10-CM

## 2021-04-29 DIAGNOSIS — J90 Pleural effusion, not elsewhere classified: Secondary | ICD-10-CM

## 2021-04-29 DIAGNOSIS — Z9889 Other specified postprocedural states: Secondary | ICD-10-CM

## 2021-04-29 DIAGNOSIS — S0003XA Contusion of scalp, initial encounter: Secondary | ICD-10-CM | POA: Diagnosis present

## 2021-04-29 DIAGNOSIS — Z9181 History of falling: Secondary | ICD-10-CM

## 2021-04-29 DIAGNOSIS — I4821 Permanent atrial fibrillation: Secondary | ICD-10-CM | POA: Diagnosis present

## 2021-04-29 DIAGNOSIS — I110 Hypertensive heart disease with heart failure: Principal | ICD-10-CM | POA: Diagnosis present

## 2021-04-29 DIAGNOSIS — N390 Urinary tract infection, site not specified: Secondary | ICD-10-CM | POA: Diagnosis present

## 2021-04-29 DIAGNOSIS — Z781 Physical restraint status: Secondary | ICD-10-CM

## 2021-04-29 DIAGNOSIS — B952 Enterococcus as the cause of diseases classified elsewhere: Secondary | ICD-10-CM | POA: Diagnosis present

## 2021-04-29 DIAGNOSIS — F03911 Unspecified dementia, unspecified severity, with agitation: Secondary | ICD-10-CM | POA: Diagnosis present

## 2021-04-29 DIAGNOSIS — Z79899 Other long term (current) drug therapy: Secondary | ICD-10-CM | POA: Diagnosis not present

## 2021-04-29 DIAGNOSIS — Z823 Family history of stroke: Secondary | ICD-10-CM | POA: Diagnosis not present

## 2021-04-29 DIAGNOSIS — I5023 Acute on chronic systolic (congestive) heart failure: Secondary | ICD-10-CM | POA: Diagnosis present

## 2021-04-29 DIAGNOSIS — Z20822 Contact with and (suspected) exposure to covid-19: Secondary | ICD-10-CM | POA: Diagnosis present

## 2021-04-29 DIAGNOSIS — Z7901 Long term (current) use of anticoagulants: Secondary | ICD-10-CM

## 2021-04-29 DIAGNOSIS — R627 Adult failure to thrive: Secondary | ICD-10-CM | POA: Diagnosis not present

## 2021-04-29 DIAGNOSIS — F0394 Unspecified dementia, unspecified severity, with anxiety: Secondary | ICD-10-CM | POA: Diagnosis present

## 2021-04-29 DIAGNOSIS — I272 Pulmonary hypertension, unspecified: Secondary | ICD-10-CM | POA: Diagnosis present

## 2021-04-29 DIAGNOSIS — I482 Chronic atrial fibrillation, unspecified: Secondary | ICD-10-CM | POA: Diagnosis not present

## 2021-04-29 DIAGNOSIS — M1990 Unspecified osteoarthritis, unspecified site: Secondary | ICD-10-CM | POA: Diagnosis present

## 2021-04-29 DIAGNOSIS — Z515 Encounter for palliative care: Secondary | ICD-10-CM

## 2021-04-29 DIAGNOSIS — I081 Rheumatic disorders of both mitral and tricuspid valves: Secondary | ICD-10-CM | POA: Diagnosis present

## 2021-04-29 DIAGNOSIS — Z8249 Family history of ischemic heart disease and other diseases of the circulatory system: Secondary | ICD-10-CM | POA: Diagnosis not present

## 2021-04-29 DIAGNOSIS — R531 Weakness: Secondary | ICD-10-CM | POA: Diagnosis not present

## 2021-04-29 DIAGNOSIS — R519 Headache, unspecified: Secondary | ICD-10-CM | POA: Diagnosis present

## 2021-04-29 DIAGNOSIS — R52 Pain, unspecified: Secondary | ICD-10-CM | POA: Diagnosis not present

## 2021-04-29 DIAGNOSIS — Z833 Family history of diabetes mellitus: Secondary | ICD-10-CM

## 2021-04-29 LAB — CBC WITH DIFFERENTIAL/PLATELET
Abs Immature Granulocytes: 0.17 10*3/uL — ABNORMAL HIGH (ref 0.00–0.07)
Basophils Absolute: 0 10*3/uL (ref 0.0–0.1)
Basophils Relative: 0 %
Eosinophils Absolute: 0.1 10*3/uL (ref 0.0–0.5)
Eosinophils Relative: 0 %
HCT: 41.2 % (ref 36.0–46.0)
Hemoglobin: 12.5 g/dL (ref 12.0–15.0)
Immature Granulocytes: 1 %
Lymphocytes Relative: 6 %
Lymphs Abs: 0.8 10*3/uL (ref 0.7–4.0)
MCH: 30.9 pg (ref 26.0–34.0)
MCHC: 30.3 g/dL (ref 30.0–36.0)
MCV: 102 fL — ABNORMAL HIGH (ref 80.0–100.0)
Monocytes Absolute: 0.8 10*3/uL (ref 0.1–1.0)
Monocytes Relative: 6 %
Neutro Abs: 10.8 10*3/uL — ABNORMAL HIGH (ref 1.7–7.7)
Neutrophils Relative %: 87 %
Platelets: 328 10*3/uL (ref 150–400)
RBC: 4.04 MIL/uL (ref 3.87–5.11)
RDW: 14.8 % (ref 11.5–15.5)
WBC: 12.5 10*3/uL — ABNORMAL HIGH (ref 4.0–10.5)
nRBC: 0 % (ref 0.0–0.2)

## 2021-04-29 LAB — URINALYSIS, ROUTINE W REFLEX MICROSCOPIC
Bilirubin Urine: NEGATIVE
Glucose, UA: NEGATIVE mg/dL
Ketones, ur: NEGATIVE mg/dL
Nitrite: NEGATIVE
Protein, ur: 30 mg/dL — AB
Specific Gravity, Urine: 1.02 (ref 1.005–1.030)
WBC, UA: >50 WBC/hpf — ABNORMAL HIGH (ref 0–5)
pH: 7 (ref 5.0–8.0)

## 2021-04-29 LAB — BASIC METABOLIC PANEL
Anion gap: 9 (ref 5–15)
BUN: 20 mg/dL (ref 8–23)
CO2: 27 mmol/L (ref 22–32)
Calcium: 9 mg/dL (ref 8.9–10.3)
Chloride: 103 mmol/L (ref 98–111)
Creatinine, Ser: 1.03 mg/dL — ABNORMAL HIGH (ref 0.44–1.00)
GFR, Estimated: 52 mL/min — ABNORMAL LOW (ref >60)
Glucose, Bld: 130 mg/dL — ABNORMAL HIGH (ref 70–99)
Potassium: 3.4 mmol/L — ABNORMAL LOW (ref 3.5–5.1)
Sodium: 139 mmol/L (ref 135–145)

## 2021-04-29 LAB — RESP PANEL BY RT-PCR (FLU A&B, COVID) ARPGX2
Influenza A by PCR: NEGATIVE
Influenza B by PCR: NEGATIVE
SARS Coronavirus 2 by RT PCR: NEGATIVE

## 2021-04-29 LAB — TROPONIN I (HIGH SENSITIVITY): Troponin I (High Sensitivity): 11 ng/L (ref <18)

## 2021-04-29 LAB — BRAIN NATRIURETIC PEPTIDE: B Natriuretic Peptide: 197 pg/mL — ABNORMAL HIGH (ref 0.0–100.0)

## 2021-04-29 MED ORDER — SODIUM CHLORIDE 0.9 % IV SOLN
INTRAVENOUS | Status: DC | PRN
  Administered 2021-04-29: 250 mL via INTRAVENOUS

## 2021-04-29 MED ORDER — METOPROLOL TARTRATE 5 MG/5ML IV SOLN
5.0000 mg | Freq: Once | INTRAVENOUS | Status: AC
  Administered 2021-04-29: 5 mg via INTRAVENOUS
  Filled 2021-04-29: qty 5

## 2021-04-29 MED ORDER — HALOPERIDOL LACTATE 5 MG/ML IJ SOLN
2.0000 mg | Freq: Once | INTRAMUSCULAR | Status: AC
  Administered 2021-04-29: 2 mg via INTRAVENOUS
  Filled 2021-04-29: qty 1

## 2021-04-29 MED ORDER — POTASSIUM CHLORIDE 10 MEQ/100ML IV SOLN
10.0000 meq | INTRAVENOUS | Status: AC
  Administered 2021-04-29 – 2021-04-30 (×2): 10 meq via INTRAVENOUS
  Filled 2021-04-29 (×2): qty 100

## 2021-04-29 MED ORDER — IOHEXOL 350 MG/ML SOLN
75.0000 mL | Freq: Once | INTRAVENOUS | Status: AC | PRN
  Administered 2021-04-29: 75 mL via INTRAVENOUS

## 2021-04-29 NOTE — Assessment & Plan Note (Signed)
Required chemical and physical restraints this afternoon due to agitation.

## 2021-04-29 NOTE — Assessment & Plan Note (Signed)
Stable

## 2021-04-29 NOTE — H&P (Signed)
History and Physical    [name] QZE:[id number] DOB: 04-23-1930 DOA: 04/29/2021  PCP: Garwin Brothers, MD   Patient coming from: SNF  I have personally briefly reviewed patient's old medical records in [place]  CC: multiple falls HPI: 87 year old female with a history of chronic atrial fibrillation, dementia with behavioral disturbance, hypertension presents to the ER from the nursing home due to repeated falls today.  Patient is fallen least 3 times at the nursing home today.  Son states that she has fallen 6 times in the last week.  Patient has a history of A. fib is on Eliquis.  Patient evaluated in the ER as a trauma patient.  Trauma scans were all negative.  Patient noted to have atrial fibrillation.  CT scan performed of her chest today due to persistent hypoxia demonstrated bilateral pleural effusions right greater than left that have significantly increased in size since her scan in September 2022.  Patient has dementia cannot give any history or review of systems.  Have discussed the case with the patient's son [name] who states that he would like the patient admitted and have her effusions removed via thoracentesis.  Patient recently seen by palliative care as an outpatient.  Son was hopeful that the patient could go to [facility] under hospice care.  He would like the patient's pleural effusions to be dealt with prior to discussing inpatient hospice.  Son does confirm patient is a DNR/DNI.   ED Course: pt presented as a trauma patient. No fractures noted. CT chest performed due to hypoxia. Shows bilateral pleural effusions R >L.  Review of Systems:  Review of Systems  Unable to perform ROS: Dementia    Past Medical History:  Diagnosis Date   Anemia     Arthritis      "back" (01/16/2018)   Atrial fibrillation ([place])     Cancer (HCC)      low grade papillary, most recent recurrence 2011,  Dr Karsten Ro   Cardiac arrhythmia due to congenital heart disease      History of blood transfusion 01/15/2018    S/P OR   Hypertension     Hyponatremia 01/26/12    Na+ 131   Macular degeneration, bilateral      "were wet; dry now" (01/16/2018)   Other abnormal glucose 06/14/12    Fasting blood glucose 128; A1c 5.8% on 12/15/11           Past Surgical History:  Procedure Laterality Date   ABDOMINAL HYSTERECTOMY   1970s    with oopherectomy for incidenctal cyst (no PMH of abnormal PAP)   APPENDECTOMY        1970s   BLADDER TUMOR EXCISION   2011 & 2013    Dr Karsten Ro   CATARACT EXTRACTION, BILATERAL   2014    Dr Herbert Deaner   COLONOSCOPY        negative; Dr Olevia Perches   CYSTOSCOPY         multiple since tumor excision;Dr [place] ([place]) NAIL INTERTROCHANTERIC Right 01/15/2018    Procedure: INTRAMEDULLARY (IM) NAIL INTERTROCHANTRIC;  Surgeon: Melrose Nakayama, MD;  Location: [facility];  Service: Orthopedics;  Laterality: Right;   KYPHOPLASTY N/A 03/08/2018    Procedure: THORACIC 12 KYPHOPLASTY;  Surgeon: Phylliss Bob, MD;  Location: [facility];  Service: Orthopedics;  Laterality: N/A;   TONSILLECTOMY  reports that she has never smoked. She has never used smokeless tobacco. She reports that she does not drink alcohol and does not use drugs.   No Known Allergies        Family History  Problem Relation Age of Onset   Stroke Mother 63   Heart attack Father 80   Coronary artery disease Brother          S/P CBAG ? @ 69   Diabetes Brother     Hypertension Brother     Kidney disease Neg Hx     Cancer Neg Hx     Colon cancer Neg Hx     Colon polyps Neg Hx          Prior to Admission medications   Medication Sig Start Date End Date Taking? Authorizing Provider  calcium carbonate (OSCAL) 1500 (600 Ca) MG TABS tablet Take 600 mg of elemental calcium by mouth 2 (two) times daily with a meal.   Yes [provider]  Cholecalciferol (D3-1000) 25 MCG (1000 UT) tablet Take 2,000  Units by mouth daily.   Yes [provider]  diltiazem (DILTIAZEM CD) 120 MG 24 hr capsule Take 120 mg by mouth daily.   Yes [provider]  ELIQUIS 2.5 MG TABS tablet Take 2.5 mg by mouth 2 (two) times daily. 03/03/21  Yes [provider]  HYDROcodone-acetaminophen (NORCO/VICODIN) 5-325 MG tablet Take 1 tablet by mouth every 12 (twelve) hours as needed for pain. 03/18/21  Yes [provider]  magnesium oxide (MAG-OX) 400 MG tablet Take 400 mg by mouth daily.   Yes [provider]  metoprolol succinate (TOPROL-XL) 50 MG 24 hr tablet Take 50 mg by mouth daily. 04/16/21  Yes [provider]  Omega-3 Fatty Acids (FISH OIL) 1000 MG CAPS Take 1,000 mg by mouth daily.   Yes [provider]  polyethylene glycol (MIRALAX / GLYCOLAX) 17 g packet Take 17 g by mouth 2 (two) times daily.   Yes [provider]  senna (SENOKOT) 8.6 MG TABS tablet Take 1 tablet by mouth daily.   Yes [provider]  sertraline (ZOLOFT) 50 MG tablet Take 50 mg by mouth daily. 04/16/21  Yes [provider]    Physical Exam: Vitals:   04/29/21 2118 04/29/21 2130 04/29/21 2240 04/29/21 2300  BP: (!) 138/115  114/87 124/73  Pulse: 98 80 100 (!) 109  Resp: (!) 34 (!) 28 (!) 28 (!) 30  Temp:      TempSrc:      SpO2: 92% (!) 79% 97% 96%  Weight:      Height:        Physical Exam Vitals and nursing note reviewed.  Constitutional:      General: She is not in acute distress.    Appearance: She is not toxic-appearing.     Comments: Frail and chronically ill appearing  HENT:     Head: Normocephalic.     Comments: Hematoma right side of head    Nose: Nose normal. No rhinorrhea.  Eyes:     General:        Right eye: No discharge.        Left eye: No discharge.  Cardiovascular:     Rate and Rhythm: Tachycardia present. Rhythm irregular.     Pulses: Normal pulses.  Pulmonary:     Effort: No respiratory distress.     Breath sounds:  Examination of the right-middle field reveals decreased breath sounds. Examination of the left-middle  field reveals decreased breath sounds. Examination of the right-lower field reveals decreased breath sounds. Examination of the left-lower field reveals decreased breath sounds. Decreased breath sounds present.  Abdominal:     General: Bowel sounds are normal. There is no distension.     Tenderness: There is no abdominal tenderness. There is no guarding.  Musculoskeletal:     Right lower leg: Edema present.  Skin:    General: Skin is warm and dry.     Capillary Refill: Capillary refill takes less than 2 seconds.  Neurological:     Mental Status: She is disoriented.     Labs on Admission: I have personally reviewed following labs and imaging studies  CBC: Recent Labs  Lab 04/29/21 1754  WBC 12.5*  NEUTROABS 10.8*  HGB 12.5  HCT 41.2  MCV 102.0*  PLT 413   Basic Metabolic Panel: Recent Labs  Lab 04/29/21 1754  NA 139  K 3.4*  CL 103  CO2 27  GLUCOSE 130*  BUN 20  CREATININE 1.03*  CALCIUM 9.0   GFR: Estimated Creatinine Clearance: 26.5 mL/min (A) (by C-G formula based on SCr of 1.03 mg/dL (H)). Liver Function Tests: No results for input(s): AST, ALT, ALKPHOS, BILITOT, PROT, ALBUMIN in the last 168 hours. No results for input(s): LIPASE, AMYLASE in the last 168 hours. No results for input(s): AMMONIA in the last 168 hours. Coagulation Profile: No results for input(s): INR, PROTIME in the last 168 hours. Cardiac Enzymes: No results for input(s): CKTOTAL, CKMB, CKMBINDEX, TROPONINI in the last 168 hours. BNP (last 3 results) No results for input(s): PROBNP in the last 8760 hours. HbA1C: No results for input(s): HGBA1C in the last 72 hours. CBG: No results for input(s): GLUCAP in the last 168 hours. Lipid Profile: No results for input(s): CHOL, HDL, LDLCALC, TRIG, CHOLHDL, LDLDIRECT in the last 72 hours. Thyroid Function Tests: No results for input(s): TSH,  T4TOTAL, FREET4, T3FREE, THYROIDAB in the last 72 hours. Anemia Panel: No results for input(s): VITAMINB12, FOLATE, FERRITIN, TIBC, IRON, RETICCTPCT in the last 72 hours. Urine analysis:    Component Value Date/Time   COLORURINE YELLOW 04/29/2021 2234   APPEARANCEUR CLOUDY (A) 04/29/2021 2234   LABSPEC 1.020 04/29/2021 2234   PHURINE 7.0 04/29/2021 2234   GLUCOSEU NEGATIVE 04/29/2021 2234   HGBUR MODERATE (A) 04/29/2021 2234   [place] 04/29/2021 2234   [place] 04/29/2021 2234   PROTEINUR 30 (A) 04/29/2021 2234   NITRITE NEGATIVE 04/29/2021 2234   LEUKOCYTESUR LARGE (A) 04/29/2021 2234    Radiological Exams on Admission: I have personally reviewed images DG Chest 2 View  Result Date: 04/29/2021 CLINICAL DATA:  Fall.  Blood thinners. EXAM: CHEST - 2 VIEW COMPARISON:  Chest x-ray 04/04/2021 FINDINGS: The heart and mediastinal contours are unchanged. Aortic calcification. Biapical pleural/pulmonary scarring. Bilateral lower lobe patchy airspace opacities. No pulmonary edema. At least small volume bilateral pleural effusions. No pneumothorax. No acute osseous abnormality. Compression fracture status post kyphoplasty of likely the L1 level. IMPRESSION: Interval development of at least small volume bilateral pleural effusions. Associated bilateral lower lung zone airspace opacities that may represent infection/inflammation versus atelectasis. Followup PA and lateral chest X-ray is recommended in 3-4 weeks following therapy to ensure resolution and exclude underlying malignancy. Electronically Signed   By: Iven Finn M.D.   On: 04/29/2021 18:07   DG Pelvis 1-2 Views  Result Date: 04/29/2021 CLINICAL DATA:  Status post fall.  On blood thinners. EXAM: PELVIS - 1-2 VIEW COMPARISON:  X-ray pelvis  04/04/2021 FINDINGS: Markedly limited evaluation due to overlapping osseous structures and overlying soft tissues. There is no evidence of pelvic fracture or diastasis. No  aggressive appearing pelvic bone lesions are seen. Old healed right femoral fracture status post intramedullary nail fixation. No dislocation of the right hip. No dislocation of the left hip. No definite acute displaced fracture of the left hip on frontal view. Sacrum grossly unremarkable. Degenerative changes of the visualized lower lumbar spine. Phleboliths overlie the pelvis. IMPRESSION: Negative for acute traumatic injury. Electronically Signed   By: Iven Finn M.D.   On: 04/29/2021 18:09   CT HEAD WO CONTRAST (5MM)  Result Date: 04/29/2021 CLINICAL DATA:  [place], head trauma EXAM: CT HEAD WITHOUT CONTRAST TECHNIQUE: Contiguous axial images were obtained from the base of the skull through the vertex without intravenous contrast. COMPARISON:  04/04/2021 FINDINGS: Brain: No signs of acute infarct or hemorrhage. Stable hypodensities throughout the periventricular white matter consistent with chronic small vessel ischemic change. Lateral ventricles and midline structures are unremarkable. No acute extra-axial fluid collections. No mass effect. Vascular: No hyperdense vessel or unexpected calcification. Stable atherosclerosis. Skull: Right parietal scalp hematoma. No underlying fractures. Remainder of the calvarium is unremarkable. Sinuses/Orbits: No acute finding. Other: None. IMPRESSION: 1. Right parietal scalp hematoma. 2. No acute intracranial process. Electronically Signed   By: Randa Ngo M.D.   On: 04/29/2021 17:29   CT Angio Chest PE W and/or Wo Contrast  Result Date: 04/29/2021 CLINICAL DATA:  Chest pain and shortness of breath. EXAM: CT ANGIOGRAPHY CHEST WITH CONTRAST TECHNIQUE: Multidetector CT imaging of the chest was performed using the standard protocol during bolus administration of intravenous contrast. Multiplanar CT image reconstructions and MIPs were obtained to evaluate the vascular anatomy. CONTRAST:  28mL OMNIPAQUE IOHEXOL 350 MG/ML SOLN COMPARISON:  April 04, 2021 FINDINGS:  Cardiovascular: Marked severity calcification of the aortic arch is seen without evidence of aortic aneurysm or dissection. Satisfactory opacification of the pulmonary arteries to the segmental level. No evidence of pulmonary embolism. Mild cardiomegaly with marked severity coronary artery calcification. No pericardial effusion. Mediastinum/Nodes: Mild pretracheal and AP window lymphadenopathy is seen. Thyroid gland, trachea, and esophagus demonstrate no significant findings. Lungs/Pleura: Stable, moderate severity areas of biapical scarring and/or atelectasis are seen. Mild anterior right upper lobe, mild inferior left upper lobe and moderate to marked severity bibasilar atelectasis and/or infiltrate is seen. This is a new finding when compared to the prior study. Moderate severity bilateral pleural effusions are noted, right greater than left. These are both increased in size when compared to the prior study. No pneumothorax is identified. Upper Abdomen: A 2.3 cm x 1.9 cm low-attenuation left adrenal mass is seen (approximately 23.17 Hounsfield units). Musculoskeletal: Multiple chronic left-sided rib fractures are seen. A compression fracture deformity is seen at the level of T9 with prior vertebroplasty noted at the level of L1. Review of the MIP images confirms the above findings. IMPRESSION: 1. No evidence of pulmonary embolus. 2. Moderate severity bilateral pleural effusions, right greater than left, increased in size when compared to the prior study. 3. Mild anterior right upper lobe, mild inferior left upper lobe and moderate to marked severity bibasilar atelectasis and/or infiltrate. 4. Stable cardiomegaly with marked severity coronary artery calcification. 5. Low-attenuation left adrenal mass which may represent an adrenal adenoma. Further evaluation with nonemergent adrenal protocol CT is recommended. Aortic Atherosclerosis (ICD10-I70.0). Electronically Signed   By: Virgina Norfolk M.D.   On: 04/29/2021  22:15   CT Cervical Spine Wo Contrast  Result Date: 04/29/2021 CLINICAL DATA:  [place] EXAM: CT CERVICAL SPINE WITHOUT CONTRAST TECHNIQUE: Multidetector CT imaging of the cervical spine was performed without intravenous contrast. Multiplanar CT image reconstructions were also generated. COMPARISON:  04/04/2021 FINDINGS: Alignment: Reversal cervical lordosis centered at C4 unchanged, likely due to multilevel spondylosis. Otherwise alignment is anatomic. Skull base and vertebrae: No acute fracture. No primary bone lesion or focal pathologic process. Soft tissues and spinal canal: No prevertebral fluid or swelling. No visible canal hematoma. Disc levels: Extensive multilevel spondylosis and facet hypertrophy again identified, most pronounced from C3-4 through C5-6. Upper chest: Mucoid material within the trachea. Right pleural effusion is noted. Other: Reconstructed images demonstrate no additional findings. IMPRESSION: 1. No acute cervical spine fracture. 2. Stable multilevel spondylosis and facet hypertrophy. Electronically Signed   By: Randa Ngo M.D.   On: 04/29/2021 17:27    EKG: I have personally reviewed EKG: shows afib    Assessment/Plan Principal Problem:   Acute respiratory failure with hypoxia (HCC) Active Problems:   Bilateral pleural effusion   Fall   A-fib ([place])   Benign essential HTN   DNR (do not resuscitate)/DNI(Do not intubate)   Dementia with behavioral disturbance    Acute respiratory failure with hypoxia ([place]) Admit to telemetry bed. Hypoxia likely due to bilateral pleural effusion. Will order thoracentesis for Saturday. Pt likely received her Eliquis at [facility] today. Will have right side performed first at this is the largest effusion, then consider left side if pt is still hypoxic. Both effusion have gotten substantially larger since her CT chest 04-05-2021.  A-fib ([place]) Continue cardizem and lopressor. Will hold Eliquis due to need for thoracentesis. Pt with repeated  falls at SNF. Systemic anticoagulation is contraindicated. Discussed with son [name] who agrees. Will stop systemic anticoagulation permanently.     Benign essential HTN Stable.  Bilateral pleural effusion Will need thoracentesis as compression of her lung is likely causing her hypoxia. Will have right side performed first, then consider left side thoracentesis if pt still requires supplemental O2.  DNR (do not resuscitate)/DNI(Do not intubate) Verified DNR papework.  Fall No fractures seen on xrays and CT today. Pt with repeated falls at SNF. Systemic anticoagulation is contraindicated. Discussed with son [name] who agrees. Will stop systemic anticoagulation permanently.    Dementia with behavioral disturbance Required chemical and physical restraints this afternoon due to agitation.  DVT prophylaxis: SCDs Code Status: DNR/DNI(Do NOT Intubate) verified with pt's son    Family Communication: discussed via phone with pt's son [name]  Disposition Plan: SNF vs hospice house  Consults called: none  Admission status: Inpatient, Telemetry bed   Kristopher Oppenheim, DO [facility] 04/29/2021, 11:23 PM

## 2021-04-29 NOTE — Assessment & Plan Note (Addendum)
Continue cardizem and lopressor. Will hold Eliquis due to need for thoracentesis. Pt with repeated falls at SNF. Systemic anticoagulation is contraindicated. Discussed with son [name] who agrees. Will stop systemic anticoagulation permanently.

## 2021-04-29 NOTE — Assessment & Plan Note (Addendum)
No fractures seen on xrays and CT today. Pt with repeated falls at SNF. Systemic anticoagulation is contraindicated. Discussed with son [name] who agrees. Will stop systemic anticoagulation permanently.

## 2021-04-29 NOTE — ED Notes (Addendum)
Trauma Response Nurse Note-  Reason for Call / Reason for Trauma activation:   - Multiple falls today at [facility]. Hit head multiple times, takes Eliquis for Afib.  Initial Focused Assessment (If applicable, or please see trauma documentation):  - Patient in no acute distress. VSS enroute. Baseline dementia, is able to verbalize name and birthday. Complaining only of pain on top of head. Abrasion to left elbow. No other obvious traumatic injury.  Interventions:  - CT head and neck, XR Chest and Pelvis, Bmet, CBC  Plan of Care as of this note:  - All imaging and lab work complete.  Event Summary:   - CT head and cspine negative, small external scalp hematoma seen. Other imaging pending at this time.

## 2021-04-29 NOTE — ED Notes (Signed)
Security notified

## 2021-04-29 NOTE — Progress Notes (Signed)
Orthopedic Tech Progress Note Patient Details:  [name] 05-02-1930 [id number]  Patient ID: [name], female   DOB: [birth year], 87 y.o.   MRN: [id number] Level II Trauma; Not Needed At The Moment. Vernona Rieger 04/29/2021, 4:55 PM

## 2021-04-29 NOTE — Assessment & Plan Note (Signed)
Admit to telemetry bed. Hypoxia likely due to bilateral pleural effusion. Will order thoracentesis for Saturday. Pt likely received her Eliquis at [facility] today. Will have right side performed first at this is the largest effusion, then consider left side if pt is still hypoxic. Both effusion have gotten substantially larger since her CT chest 04-05-2021.

## 2021-04-29 NOTE — ED Notes (Signed)
Patient transported to CT 

## 2021-04-29 NOTE — ED Notes (Addendum)
Phlebotomy notified

## 2021-04-29 NOTE — Subjective & Objective (Signed)
CC: multiple falls HPI: 87 year old female with a history of chronic atrial fibrillation, dementia with behavioral disturbance, hypertension presents to the ER from the nursing home due to repeated falls today.  Patient is fallen least 3 times at the nursing home today.  Son states that she has fallen 6 times in the last week.  Patient has a history of A. fib is on Eliquis.  Patient evaluated in the ER as a trauma patient.  Trauma scans were all negative.  Patient noted to have atrial fibrillation.  CT scan performed of her chest today due to persistent hypoxia demonstrated bilateral pleural effusions right greater than left that have significantly increased in size since her scan in September 2022.  Patient has dementia cannot give any history or review of systems.  Have discussed the case with the patient's son [name] who states that he would like the patient admitted and have her effusions removed via thoracentesis.  Patient recently seen by palliative care as an outpatient.  Son was hopeful that the patient could go to [facility] under hospice care.  He would like the patient's pleural effusions to be dealt with prior to discussing inpatient hospice.  Son does confirm patient is a DNR/DNI.

## 2021-04-29 NOTE — ED Notes (Signed)
Radiology notified.

## 2021-04-29 NOTE — Assessment & Plan Note (Signed)
Verified DNR papework.

## 2021-04-29 NOTE — ED Provider Notes (Signed)
[facility] EMERGENCY DEPARTMENT Provider Note   CSN: [id number] Arrival date & time: 04/29/21  1647     History Chief Complaint  Patient presents with   Fall   Trauma    [name] is a 87 y.o. female.  The history is provided by the patient.  Fall This is a new problem. The current episode started less than 1 hour ago. The problem has been resolved. Associated symptoms include headaches. Pertinent negatives include no chest pain, no abdominal pain and no shortness of breath. Nothing aggravates the symptoms. Nothing relieves the symptoms. She has tried nothing for the symptoms. The treatment provided no relief.  Trauma Mechanism of injury: Fall   Current symptoms:      Associated symptoms:            Reports headache.            Denies abdominal pain, back pain, chest pain, seizures and vomiting.      No past medical history on file.  There are no problems to display for this patient.   Atrial fibrillation, dementia   OB History   No obstetric history on file.     No family history on file.     Home Medications Prior to Admission medications   Medication Sig Start Date End Date Taking? Authorizing Provider  calcium carbonate (OSCAL) 1500 (600 Ca) MG TABS tablet Take 600 mg of elemental calcium by mouth 2 (two) times daily with a meal.   Yes [provider]  Cholecalciferol (D3-1000) 25 MCG (1000 UT) tablet Take 2,000 Units by mouth daily.   Yes [provider]  diltiazem (DILTIAZEM CD) 120 MG 24 hr capsule Take 120 mg by mouth daily.   Yes [provider]  ELIQUIS 2.5 MG TABS tablet Take 2.5 mg by mouth 2 (two) times daily. 03/03/21  Yes [provider]  HYDROcodone-acetaminophen (NORCO/VICODIN) 5-325 MG tablet Take 1 tablet by mouth every 12 (twelve) hours as needed for pain. 03/18/21  Yes [provider]  magnesium oxide (MAG-OX) 400 MG tablet Take 400 mg by mouth daily.   Yes [provider]  metoprolol succinate (TOPROL-XL) 50 MG 24 hr tablet Take 50 mg by mouth daily. 04/16/21  Yes [provider]  Omega-3 Fatty Acids (FISH OIL) 1000 MG CAPS Take 1,000 mg by mouth daily.   Yes [provider]  polyethylene glycol (MIRALAX / GLYCOLAX) 17 g packet Take 17 g by mouth 2 (two) times daily.   Yes [provider]  senna (SENOKOT) 8.6 MG TABS tablet Take 1 tablet by mouth daily.   Yes [provider]  sertraline (ZOLOFT) 50 MG tablet Take 50 mg by mouth daily. 04/16/21  Yes [provider]    Allergies    Patient has no known allergies.  Review of Systems   Review of Systems  Constitutional:  Negative for chills and fever.  HENT:  Negative for ear pain and sore throat.   Eyes:  Negative for pain and visual disturbance.  Respiratory:  Negative for cough and shortness of breath.   Cardiovascular:  Negative for chest pain and palpitations.  Gastrointestinal:  Negative for abdominal pain and vomiting.  Genitourinary:  Negative for dysuria and hematuria.  Musculoskeletal:  Negative for arthralgias and back pain.  Skin:  Negative for color change, rash and wound.  Neurological:  Positive for headaches. Negative for dizziness, seizures and syncope.  All other systems reviewed and are negative.  Physical Exam  Updated Vital Signs BP 114/87   Pulse 100   Temp 98.2 F (36.8 C) (Oral)   Resp (!) 28   Ht 5\' 5"  (1.651 m)   Wt 45.4 kg   SpO2 97%   BMI 16.64 kg/m   Physical Exam Vitals and nursing note reviewed.  Constitutional:      General: She is not in acute distress.    Appearance: She is well-developed. She is not ill-appearing.  HENT:     Head:     Comments: Hematoma to posterior scalp Eyes:     Conjunctiva/sclera: Conjunctivae normal.  Cardiovascular:     Rate and Rhythm: Normal rate and regular rhythm.     Pulses: Normal pulses.     Heart sounds: Normal heart sounds. No murmur heard. Pulmonary:     Effort: Pulmonary  effort is normal. No respiratory distress.     Breath sounds: Normal breath sounds.  Abdominal:     Palpations: Abdomen is soft.     Tenderness: There is no abdominal tenderness.  Musculoskeletal:        General: No tenderness. Normal range of motion.     Cervical back: Neck supple. No tenderness.     Comments: No midline tenderness of cervical, thoracic, lumbar spine, and cervical collar  Skin:    General: Skin is warm and dry.     Capillary Refill: Capillary refill takes less than 2 seconds.     Findings: No erythema or rash.  Neurological:     General: No focal deficit present.     Mental Status: She is alert.     Cranial Nerves: No cranial nerve deficit.     Sensory: No sensory deficit.     Motor: No weakness.     Coordination: Coordination normal.  Psychiatric:        Mood and Affect: Mood normal.    ED Results / Procedures / Treatments   Labs (all labs ordered are listed, but only abnormal results are displayed) Labs Reviewed  CBC WITH DIFFERENTIAL/PLATELET - Abnormal; Notable for the following components:      Result Value   WBC 12.5 (*)    MCV 102.0 (*)    Neutro Abs 10.8 (*)    Abs Immature Granulocytes 0.17 (*)    All other components within normal limits  BASIC METABOLIC PANEL - Abnormal; Notable for the following components:   Potassium 3.4 (*)    Glucose, Bld 130 (*)    Creatinine, Ser 1.03 (*)    GFR, Estimated 52 (*)    All other components within normal limits  BRAIN NATRIURETIC PEPTIDE - Abnormal; Notable for the following components:   B Natriuretic Peptide 197.0 (*)    All other components within normal limits  RESP PANEL BY RT-PCR (FLU A&B, COVID) [id number]  URINE CULTURE  URINALYSIS, ROUTINE W REFLEX MICROSCOPIC  TROPONIN I (HIGH SENSITIVITY)    EKG EKG Interpretation  Date/Time:  Thursday April 29 2021 17:33:09 EDT Ventricular Rate:  106 PR Interval:    QRS Duration: 89 QT Interval:  367 QTC Calculation: 488 R Axis:   -81 Text  Interpretation: Atrial fibrillation Abnormal R-wave progression, early transition Inferior infarct, old Consider anterior infarct Lateral leads are also involved Confirmed by Lennice Sites [phone]) on 04/29/2021 6:42:23 PM  Radiology DG Chest 2 View  Result Date: 04/29/2021 CLINICAL DATA:  Fall.  Blood thinners. EXAM: CHEST - 2 VIEW COMPARISON:  Chest x-ray 04/04/2021 FINDINGS: The heart and mediastinal contours are unchanged. Aortic calcification. Biapical pleural/pulmonary  scarring. Bilateral lower lobe patchy airspace opacities. No pulmonary edema. At least small volume bilateral pleural effusions. No pneumothorax. No acute osseous abnormality. Compression fracture status post kyphoplasty of likely the L1 level. IMPRESSION: Interval development of at least small volume bilateral pleural effusions. Associated bilateral lower lung zone airspace opacities that may represent infection/inflammation versus atelectasis. Followup PA and lateral chest X-ray is recommended in 3-4 weeks following therapy to ensure resolution and exclude underlying malignancy. Electronically Signed   By: Iven Finn M.D.   On: 04/29/2021 18:07   DG Pelvis 1-2 Views  Result Date: 04/29/2021 CLINICAL DATA:  Status post fall.  On blood thinners. EXAM: PELVIS - 1-2 VIEW COMPARISON:  X-ray pelvis 04/04/2021 FINDINGS: Markedly limited evaluation due to overlapping osseous structures and overlying soft tissues. There is no evidence of pelvic fracture or diastasis. No aggressive appearing pelvic bone lesions are seen. Old healed right femoral fracture status post intramedullary nail fixation. No dislocation of the right hip. No dislocation of the left hip. No definite acute displaced fracture of the left hip on frontal view. Sacrum grossly unremarkable. Degenerative changes of the visualized lower lumbar spine. Phleboliths overlie the pelvis. IMPRESSION: Negative for acute traumatic injury. Electronically Signed   By: Iven Finn  M.D.   On: 04/29/2021 18:09   CT HEAD WO CONTRAST (5MM)  Result Date: 04/29/2021 CLINICAL DATA:  [place], head trauma EXAM: CT HEAD WITHOUT CONTRAST TECHNIQUE: Contiguous axial images were obtained from the base of the skull through the vertex without intravenous contrast. COMPARISON:  04/04/2021 FINDINGS: Brain: No signs of acute infarct or hemorrhage. Stable hypodensities throughout the periventricular white matter consistent with chronic small vessel ischemic change. Lateral ventricles and midline structures are unremarkable. No acute extra-axial fluid collections. No mass effect. Vascular: No hyperdense vessel or unexpected calcification. Stable atherosclerosis. Skull: Right parietal scalp hematoma. No underlying fractures. Remainder of the calvarium is unremarkable. Sinuses/Orbits: No acute finding. Other: None. IMPRESSION: 1. Right parietal scalp hematoma. 2. No acute intracranial process. Electronically Signed   By: Randa Ngo M.D.   On: 04/29/2021 17:29   CT Angio Chest PE W and/or Wo Contrast  Result Date: 04/29/2021 CLINICAL DATA:  Chest pain and shortness of breath. EXAM: CT ANGIOGRAPHY CHEST WITH CONTRAST TECHNIQUE: Multidetector CT imaging of the chest was performed using the standard protocol during bolus administration of intravenous contrast. Multiplanar CT image reconstructions and MIPs were obtained to evaluate the vascular anatomy. CONTRAST:  62mL OMNIPAQUE IOHEXOL 350 MG/ML SOLN COMPARISON:  April 04, 2021 FINDINGS: Cardiovascular: Marked severity calcification of the aortic arch is seen without evidence of aortic aneurysm or dissection. Satisfactory opacification of the pulmonary arteries to the segmental level. No evidence of pulmonary embolism. Mild cardiomegaly with marked severity coronary artery calcification. No pericardial effusion. Mediastinum/Nodes: Mild pretracheal and AP window lymphadenopathy is seen. Thyroid gland, trachea, and esophagus demonstrate no significant  findings. Lungs/Pleura: Stable, moderate severity areas of biapical scarring and/or atelectasis are seen. Mild anterior right upper lobe, mild inferior left upper lobe and moderate to marked severity bibasilar atelectasis and/or infiltrate is seen. This is a new finding when compared to the prior study. Moderate severity bilateral pleural effusions are noted, right greater than left. These are both increased in size when compared to the prior study. No pneumothorax is identified. Upper Abdomen: A 2.3 cm x 1.9 cm low-attenuation left adrenal mass is seen (approximately 23.17 Hounsfield units). Musculoskeletal: Multiple chronic left-sided rib fractures are seen. A compression fracture deformity is seen at the level of  T9 with prior vertebroplasty noted at the level of L1. Review of the MIP images confirms the above findings. IMPRESSION: 1. No evidence of pulmonary embolus. 2. Moderate severity bilateral pleural effusions, right greater than left, increased in size when compared to the prior study. 3. Mild anterior right upper lobe, mild inferior left upper lobe and moderate to marked severity bibasilar atelectasis and/or infiltrate. 4. Stable cardiomegaly with marked severity coronary artery calcification. 5. Low-attenuation left adrenal mass which may represent an adrenal adenoma. Further evaluation with nonemergent adrenal protocol CT is recommended. Aortic Atherosclerosis (ICD10-I70.0). Electronically Signed   By: Virgina Norfolk M.D.   On: 04/29/2021 22:15   CT Cervical Spine Wo Contrast  Result Date: 04/29/2021 CLINICAL DATA:  [place] EXAM: CT CERVICAL SPINE WITHOUT CONTRAST TECHNIQUE: Multidetector CT imaging of the cervical spine was performed without intravenous contrast. Multiplanar CT image reconstructions were also generated. COMPARISON:  04/04/2021 FINDINGS: Alignment: Reversal cervical lordosis centered at C4 unchanged, likely due to multilevel spondylosis. Otherwise alignment is anatomic. Skull base  and vertebrae: No acute fracture. No primary bone lesion or focal pathologic process. Soft tissues and spinal canal: No prevertebral fluid or swelling. No visible canal hematoma. Disc levels: Extensive multilevel spondylosis and facet hypertrophy again identified, most pronounced from C3-4 through C5-6. Upper chest: Mucoid material within the trachea. Right pleural effusion is noted. Other: Reconstructed images demonstrate no additional findings. IMPRESSION: 1. No acute cervical spine fracture. 2. Stable multilevel spondylosis and facet hypertrophy. Electronically Signed   By: Randa Ngo M.D.   On: 04/29/2021 17:27    Procedures Procedures   Medications Ordered in ED Medications  metoprolol tartrate (LOPRESSOR) injection 5 mg (5 mg Intravenous Given 04/29/21 1928)  haloperidol lactate (HALDOL) injection 2 mg (2 mg Intravenous Given 04/29/21 2124)  iohexol (OMNIPAQUE) 350 MG/ML injection 75 mL (75 mLs Intravenous Contrast Given 04/29/21 2155)    ED Course  I have reviewed the triage vital signs and the nursing notes.  Pertinent labs & imaging results that were available during my care of the patient were reviewed by me and considered in my medical decision making (see chart for details).    MDM Rules/Calculators/A&P                           [name] is an 87 year old female with history of dementia, atrial fibrillation on blood thinners who presents to the ED after fall in nursing home.  Patient had 3 falls today.  Not sure if these were witnessed.  She is pleasantly demented on exam.  She does have hematoma to the back of her head.  Oxygen appears to be in the upper 80s.  She does have some trace edema in her legs.  She is not on any fluid pills.  Recent admission for what sounds like may be aspiration pneumonia.  She has no fever.  Vital signs otherwise unremarkable.  Neurologically she appears to be intact.  Head CT, neck CT, chest x-ray, basic labs, pelvic x-ray obtained.  No  acute head bleed or cervical spine fracture.  However chest x-ray does show interval development of bilateral pleural effusions.  Overall it has been difficult to get consistent vitals on her.  When she is calm it appears that she mostly has pulse ox in the 90s but has had episode of persistent drops in the 80S. Will start 02. She does not look overloaded volume wise on exam.  We will add a troponin and BNP  and CT scan of her chest to further evaluate her pulmonary process.  We will try to obtain a urinalysis to look for any kind of UTI.  Troponin and BNP were unremarkable.    he was given IV Haldol given her agitation.  She need to be placed in soft restraints.  She appears more comfortable now.  Awaiting CT scan of her chest as well as urinalysis.  Overall CT scan does show markedly enlarged pleural effusion.  There is no evidence of PE but patient does have moderate severity bilateral pleural effusions right greater than left.  This is increased from prior study.  She does have some may be atelectasis as well or possibly infiltrate.  She does not have fever or major leukocytosis.  Overall suspect hypoxia from volume overload.  She will likely need a thoracentesis.  She is admitted to hospitalist service for further care.  This chart was dictated using voice recognition software.  Despite best efforts to proofread,  errors can occur which can change the documentation meaning.   Final Clinical Impression(s) / ED Diagnoses Final diagnoses:  Acute respiratory failure with hypoxia (HCC)  Pleural effusion    Rx / DC Orders ED Discharge Orders     None        Lennice Sites, DO 04/29/21 2251

## 2021-04-29 NOTE — ED Notes (Signed)
Ortho tech notified.  

## 2021-04-29 NOTE — ED Notes (Signed)
Registration notified

## 2021-04-29 NOTE — ED Notes (Signed)
[facility] arrived

## 2021-04-29 NOTE — ED Notes (Signed)
Pt to CT at this time.

## 2021-04-29 NOTE — Social Work (Addendum)
CSW spoke with Pt son, [name] @ [phone]. Per son, Pt has not been discharged from [facility] and is able to return there should Pt be medically cleared.  Vergie Living MSW LCSWA Transitions of Care  Clinical Social Worker  [facility]  [phone]

## 2021-04-29 NOTE — Assessment & Plan Note (Signed)
Will need thoracentesis as compression of her lung is likely causing her hypoxia. Will have right side performed first, then consider left side thoracentesis if pt still requires supplemental O2.

## 2021-04-29 NOTE — ED Triage Notes (Addendum)
Pt arrives via EMS from [facility] as level II trauma for fall on thinner. Takes eliquis. 3 falls today. Hit her head each time. Hx of dementia. Posterior head hematoma. Ccollar in place

## 2021-04-30 ENCOUNTER — Inpatient Hospital Stay (HOSPITAL_COMMUNITY): Payer: Medicare Other

## 2021-04-30 DIAGNOSIS — I4891 Unspecified atrial fibrillation: Secondary | ICD-10-CM

## 2021-04-30 HISTORY — PX: IR THORACENTESIS ASP PLEURAL SPACE W/IMG GUIDE: IMG5380

## 2021-04-30 LAB — BODY FLUID CELL COUNT WITH DIFFERENTIAL
Eos, Fluid: 0 %
Lymphs, Fluid: 50 %
Monocyte-Macrophage-Serous Fluid: 23 % — ABNORMAL LOW (ref 50–90)
Neutrophil Count, Fluid: 27 % — ABNORMAL HIGH (ref 0–25)
Total Nucleated Cell Count, Fluid: 725 cu mm (ref 0–1000)

## 2021-04-30 LAB — CBC WITH DIFFERENTIAL/PLATELET
Abs Immature Granulocytes: 0.05 10*3/uL (ref 0.00–0.07)
Basophils Absolute: 0 10*3/uL (ref 0.0–0.1)
Basophils Relative: 0 %
Eosinophils Absolute: 0.1 10*3/uL (ref 0.0–0.5)
Eosinophils Relative: 1 %
HCT: 33.2 % — ABNORMAL LOW (ref 36.0–46.0)
Hemoglobin: 10.5 g/dL — ABNORMAL LOW (ref 12.0–15.0)
Immature Granulocytes: 1 %
Lymphocytes Relative: 10 %
Lymphs Abs: 1 10*3/uL (ref 0.7–4.0)
MCH: 31.4 pg (ref 26.0–34.0)
MCHC: 31.6 g/dL (ref 30.0–36.0)
MCV: 99.4 fL (ref 80.0–100.0)
Monocytes Absolute: 0.8 10*3/uL (ref 0.1–1.0)
Monocytes Relative: 8 %
Neutro Abs: 8 10*3/uL — ABNORMAL HIGH (ref 1.7–7.7)
Neutrophils Relative %: 80 %
Platelets: 280 10*3/uL (ref 150–400)
RBC: 3.34 MIL/uL — ABNORMAL LOW (ref 3.87–5.11)
RDW: 14.7 % (ref 11.5–15.5)
WBC: 10 10*3/uL (ref 4.0–10.5)
nRBC: 0 % (ref 0.0–0.2)

## 2021-04-30 LAB — ECHOCARDIOGRAM COMPLETE
Height: 65 in
S' Lateral: 3 cm
Weight: 1600 oz

## 2021-04-30 LAB — MAGNESIUM: Magnesium: 1.9 mg/dL (ref 1.7–2.4)

## 2021-04-30 LAB — COMPREHENSIVE METABOLIC PANEL
ALT: 19 U/L (ref 0–44)
AST: 16 U/L (ref 15–41)
Albumin: 2.1 g/dL — ABNORMAL LOW (ref 3.5–5.0)
Alkaline Phosphatase: 121 U/L (ref 38–126)
Anion gap: 8 (ref 5–15)
BUN: 16 mg/dL (ref 8–23)
CO2: 26 mmol/L (ref 22–32)
Calcium: 8.6 mg/dL — ABNORMAL LOW (ref 8.9–10.3)
Chloride: 106 mmol/L (ref 98–111)
Creatinine, Ser: 0.88 mg/dL (ref 0.44–1.00)
GFR, Estimated: >60 mL/min (ref >60)
Glucose, Bld: 122 mg/dL — ABNORMAL HIGH (ref 70–99)
Potassium: 3.8 mmol/L (ref 3.5–5.1)
Sodium: 140 mmol/L (ref 135–145)
Total Bilirubin: 1 mg/dL (ref 0.3–1.2)
Total Protein: 5.4 g/dL — ABNORMAL LOW (ref 6.5–8.1)

## 2021-04-30 LAB — ALBUMIN, PLEURAL OR PERITONEAL FLUID: Albumin, Fluid: <1.5 g/dL

## 2021-04-30 LAB — PROTEIN, PLEURAL OR PERITONEAL FLUID: Total protein, fluid: <3 g/dL

## 2021-04-30 MED ORDER — MAGNESIUM OXIDE -MG SUPPLEMENT 400 (240 MG) MG PO TABS
400.0000 mg | ORAL_TABLET | Freq: Every day | ORAL | Status: DC
  Administered 2021-04-30 – 2021-05-03 (×4): 400 mg via ORAL
  Filled 2021-04-30 (×4): qty 1

## 2021-04-30 MED ORDER — ACETAMINOPHEN 325 MG PO TABS: 650.0000 mg | ORAL_TABLET | Freq: Four times a day (QID) | ORAL | Status: DC | PRN

## 2021-04-30 MED ORDER — LIDOCAINE HCL 1 % IJ SOLN
INTRAMUSCULAR | Status: AC
  Filled 2021-04-30: qty 20

## 2021-04-30 MED ORDER — DILTIAZEM HCL ER COATED BEADS 120 MG PO CP24
120.0000 mg | ORAL_CAPSULE | Freq: Every day | ORAL | Status: DC
  Administered 2021-05-01 – 2021-05-03 (×3): 120 mg via ORAL
  Filled 2021-04-30 (×6): qty 1

## 2021-04-30 MED ORDER — LIDOCAINE HCL (PF) 1 % IJ SOLN
INTRAMUSCULAR | Status: DC | PRN
  Administered 2021-04-30: 10 mL

## 2021-04-30 MED ORDER — ACETAMINOPHEN 650 MG RE SUPP: 650.0000 mg | Freq: Four times a day (QID) | RECTAL | Status: DC | PRN

## 2021-04-30 MED ORDER — ALBUTEROL SULFATE (2.5 MG/3ML) 0.083% IN NEBU: 2.5000 mg | INHALATION_SOLUTION | RESPIRATORY_TRACT | Status: DC | PRN

## 2021-04-30 MED ORDER — HALOPERIDOL LACTATE 5 MG/ML IJ SOLN
5.0000 mg | Freq: Four times a day (QID) | INTRAMUSCULAR | Status: DC | PRN
  Administered 2021-05-01 – 2021-05-02 (×2): 5 mg via INTRAVENOUS
  Filled 2021-04-30 (×2): qty 1

## 2021-04-30 MED ORDER — ONDANSETRON HCL 4 MG PO TABS: 4.0000 mg | ORAL_TABLET | Freq: Four times a day (QID) | ORAL | Status: DC | PRN

## 2021-04-30 MED ORDER — FUROSEMIDE 10 MG/ML IJ SOLN
20.0000 mg | Freq: Two times a day (BID) | INTRAMUSCULAR | Status: DC
  Administered 2021-04-30 – 2021-05-01 (×3): 20 mg via INTRAVENOUS
  Filled 2021-04-30 (×3): qty 2

## 2021-04-30 MED ORDER — METOPROLOL TARTRATE 5 MG/5ML IV SOLN
2.5000 mg | Freq: Four times a day (QID) | INTRAVENOUS | Status: DC | PRN
  Administered 2021-04-30 – 2021-05-02 (×2): 2.5 mg via INTRAVENOUS
  Filled 2021-04-30 (×3): qty 5

## 2021-04-30 MED ORDER — METOPROLOL SUCCINATE ER 50 MG PO TB24
50.0000 mg | ORAL_TABLET | Freq: Every day | ORAL | Status: DC
  Administered 2021-04-30 – 2021-05-03 (×4): 50 mg via ORAL
  Filled 2021-04-30 (×3): qty 1
  Filled 2021-04-30: qty 2

## 2021-04-30 MED ORDER — ONDANSETRON HCL 4 MG/2ML IJ SOLN: 4.0000 mg | Freq: Four times a day (QID) | INTRAMUSCULAR | Status: DC | PRN

## 2021-04-30 NOTE — Progress Notes (Signed)
Spoke with on call provider regarding pt's elevated heart rate.  Order given to give daily dose of Cardizem that she did not receive earlier in e.d if her b/p is stable. Blood pressure is stable at this time. Will give medication and reassess vs.

## 2021-04-30 NOTE — ED Notes (Signed)
Meds given crushed in apple sauce, sips of water.

## 2021-04-30 NOTE — ED Notes (Signed)
Pt alert, NAD, calm, interactive, amiable, fidgety, not restless, pt to IR by IR staff.

## 2021-04-30 NOTE — Procedures (Signed)
PROCEDURE SUMMARY:  Successful image-guided right thoracentesis. Yielded 60 cc of hazy yellow fluid. Pt tolerated procedure well. No immediate complications. EBL = trace   Specimen was sent for labs. CXR ordered.  Please see imaging section of Epic for full dictation.  Armando Gang Davyn Elsasser PA-C 04/30/2021 11:51 AM

## 2021-04-30 NOTE — Progress Notes (Signed)
PROGRESS NOTE  [name] OIN:[id number] DOB: 10-09-1929 DOA: 04/29/2021 PCP: Garwin Brothers, MD  HPI/Recap of past 52 hours: 87 year old female with a history of chronic atrial fibrillation on Eliquis, dementia with behavioral disturbance, hypertension presents to the ER from the nursing home due to repeated falls on the day of presentation.  Patient has a history of A. fib is on Eliquis.  Patient evaluated in the ER as a trauma patient.  Trauma scans were all negative.  Patient noted to have atrial fibrillation.  CT scan chest performed due to persistent hypoxia demonstrated bilateral pleural effusions right greater than left that have significantly increased in size since her scan in September 2022.  Plan for right thoracentesis on 04/30/2021 by IR.  Patient recently seen by palliative care as an outpatient.  Son was hopeful that the patient could go to [facility] under hospice care.  He would like the patient's pleural effusions to be dealt with prior to discussing inpatient hospice.  Son does confirm patient is a DNR/DNI.   04/30/2021: Patient was seen and examined at bedside in the ED.  She is pleasantly demented.  She has no new complaints.    Assessment/Plan: Principal Problem:   Acute respiratory failure with hypoxia (HCC) Active Problems:   Bilateral pleural effusion   Fall   A-fib [facility])   Benign essential HTN   DNR (do not resuscitate)/DNI(Do not intubate)   Dementia with behavioral disturbance  Acute hypoxic respiratory failure secondary to bilateral pleural effusions, right greater than left, Plan for thoracentesis on 04/30/2021 by interventional radiologist. Eliquis discontinued due to high fall risk. Maintain a saturation greater than 92%, currently on 2 L.   Permanent A-fib ([place]) Continue home cardizem and lopressor. Continue to hold off Eliquis in the setting of multiple falls.    Benign essential HTN BP is currently at goal. Continue home Cardizem and  Lopressor.   Admitted dysfunction with multiple falls.  No fractures seen on xrays and CT today.  Pt with repeated falls at SNF.  Systemic anticoagulation is contraindicated.  Previous provider discussed with son [name] who agrees.   Eliquis stopped permanently.     Dementia with behavioral disturbance Closely monitor. Fall precautions.   DVT prophylaxis: SCDs Code Status: DNR/DNI(Do NOT Intubate) verified with pt's son    Family Communication: None at bedside.  Disposition Plan: SNF vs hospice house  Consults called: none  Admission status: Inpatient, Telemetry bed         Objective: Vitals:   04/30/21 1030 04/30/21 1045 04/30/21 1100 04/30/21 1115  BP: (!) 143/95 135/83 (!) 146/103 125/81  Pulse: (!) 116  (!) 53 81  Resp: (!) 21 (!) 23 (!) 27 (!) 25  Temp:      TempSrc:      SpO2: 90%  97% 95%  Weight:      Height:        Intake/Output Summary (Last 24 hours) at 04/30/2021 1300 Last data filed at 04/30/2021 1042 Gross per 24 hour  Intake --  Output 900 ml  Net -900 ml   Filed Weights   04/29/21 1651  Weight: 45.4 kg    Exam:  General: 87 y.o. year-old female well developed well nourished in no acute distress.  Alert and pleasantly confused. Cardiovascular: Irregular rate and rhythm with no rubs or gallops.  No thyromegaly or JVD noted.   Respiratory: Mild rales at bases.  No wheezing noted.  Poor inspiratory effort. Abdomen: Soft nontender nondistended with normal bowel sounds x4  quadrants. Musculoskeletal: Trace lower extremity edema.  Skin: No ulcerative lesions noted or rashes, Psychiatry: Mood is appropriate for condition and setting   Data Reviewed: CBC: Recent Labs  Lab 04/29/21 1754 04/30/21 0600  WBC 12.5* 10.0  NEUTROABS 10.8* 8.0*  HGB 12.5 10.5*  HCT 41.2 33.2*  MCV 102.0* 99.4  PLT 328 889   Basic Metabolic Panel: Recent Labs  Lab 04/29/21 1754 04/30/21 0600  NA 139 140  K 3.4* 3.8  CL 103 106  CO2 27 26  GLUCOSE  130* 122*  BUN 20 16  CREATININE 1.03* 0.88  CALCIUM 9.0 8.6*  MG  --  1.9   GFR: Estimated Creatinine Clearance: 31.1 mL/min (by C-G formula based on SCr of 0.88 mg/dL). Liver Function Tests: Recent Labs  Lab 04/30/21 0600  AST 16  ALT 19  ALKPHOS 121  BILITOT 1.0  PROT 5.4*  ALBUMIN 2.1*   No results for input(s): LIPASE, AMYLASE in the last 168 hours. No results for input(s): AMMONIA in the last 168 hours. Coagulation Profile: No results for input(s): INR, PROTIME in the last 168 hours. Cardiac Enzymes: No results for input(s): CKTOTAL, CKMB, CKMBINDEX, TROPONINI in the last 168 hours. BNP (last 3 results) No results for input(s): PROBNP in the last 8760 hours. HbA1C: No results for input(s): HGBA1C in the last 72 hours. CBG: No results for input(s): GLUCAP in the last 168 hours. Lipid Profile: No results for input(s): CHOL, HDL, LDLCALC, TRIG, CHOLHDL, LDLDIRECT in the last 72 hours. Thyroid Function Tests: No results for input(s): TSH, T4TOTAL, FREET4, T3FREE, THYROIDAB in the last 72 hours. Anemia Panel: No results for input(s): VITAMINB12, FOLATE, FERRITIN, TIBC, IRON, RETICCTPCT in the last 72 hours. Urine analysis:    Component Value Date/Time   COLORURINE YELLOW 04/29/2021 2234   APPEARANCEUR CLOUDY (A) 04/29/2021 2234   LABSPEC 1.020 04/29/2021 2234   PHURINE 7.0 04/29/2021 2234   GLUCOSEU NEGATIVE 04/29/2021 2234   HGBUR MODERATE (A) 04/29/2021 2234   [place] 04/29/2021 [place] 04/29/2021 2234   PROTEINUR 30 (A) 04/29/2021 2234   NITRITE NEGATIVE 04/29/2021 2234   LEUKOCYTESUR LARGE (A) 04/29/2021 2234   Sepsis Labs: @LABRCNTIP (procalcitonin:4,lacticidven:4)  ) Recent Results (from the past 240 hour(s))  Resp Panel by RT-PCR (Flu A&B, Covid) Nasopharyngeal Swab     Status: None   Collection Time: 04/29/21  7:08 PM   Specimen: Nasopharyngeal Swab; Nasopharyngeal(NP) swabs in vial transport medium  Result Value Ref  Range Status   SARS Coronavirus 2 by RT PCR NEGATIVE NEGATIVE Final    Comment: (NOTE) SARS-CoV-2 target nucleic acids are NOT DETECTED.  The SARS-CoV-2 RNA is generally detectable in upper respiratory specimens during the acute phase of infection. The lowest concentration of SARS-CoV-2 viral copies this assay can detect is 138 copies/mL. A negative result does not preclude SARS-Cov-2 infection and should not be used as the sole basis for treatment or other patient management decisions. A negative result may occur with  improper specimen collection/handling, submission of specimen other than nasopharyngeal swab, presence of viral mutation(s) within the areas targeted by this assay, and inadequate number of viral copies(<138 copies/mL). A negative result must be combined with clinical observations, patient history, and epidemiological information. The expected result is Negative.  Fact Sheet for Patients:  EntrepreneurPulse.com.au  Fact Sheet for Healthcare Providers:  IncredibleEmployment.be  This test is no t yet approved or cleared by the [place] FDA and  has been authorized for detection and/or diagnosis of SARS-CoV-2  by FDA under an Emergency Use Authorization (EUA). This EUA will remain  in effect (meaning this test can be used) for the duration of the COVID-19 declaration under Section 564(b)(1) of the Act, 21 U.S.C.section 360bbb-3(b)(1), unless the authorization is terminated  or revoked sooner.       Influenza A by PCR NEGATIVE NEGATIVE Final   Influenza B by PCR NEGATIVE NEGATIVE Final    Comment: (NOTE) The Xpert Xpress SARS-CoV-2/FLU/RSV plus assay is intended as an aid in the diagnosis of influenza from Nasopharyngeal swab specimens and should not be used as a sole basis for treatment. Nasal washings and aspirates are unacceptable for Xpert Xpress SARS-CoV-2/FLU/RSV testing.  Fact Sheet for  Patients: EntrepreneurPulse.com.au  Fact Sheet for Healthcare Providers: IncredibleEmployment.be  This test is not yet approved or cleared by the [place] FDA and has been authorized for detection and/or diagnosis of SARS-CoV-2 by FDA under an Emergency Use Authorization (EUA). This EUA will remain in effect (meaning this test can be used) for the duration of the COVID-19 declaration under Section 564(b)(1) of the Act, 21 U.S.C. section 360bbb-3(b)(1), unless the authorization is terminated or revoked.  Performed at [facility], [place] [address], [place], [place] [address]       Studies: DG Chest 1 View  Result Date: 04/30/2021 CLINICAL DATA:  87 year old female status post right thoracentesis. EXAM: CHEST  1 VIEW COMPARISON:  Chest x-ray 04/29/2021. FINDINGS: Previously noted right pleural effusion has decreased considerably in size, now small. Skin fold artifact in the lateral aspect of the right hemithorax. No definitive right-sided pneumothorax confidently identified. Bibasilar opacities (left greater than right), which may reflect areas of atelectasis and/or consolidation. Small to moderate left pleural effusion. No evidence of pulmonary edema. Scarring or subsegmental atelectasis in the left mid lung. Mild cardiomegaly. The patient is rotated to the left on today's exam, resulting in distortion of the mediastinal contours and reduced diagnostic sensitivity and specificity for mediastinal pathology. Atherosclerotic calcifications in the thoracic aorta. Old healed right humeral diaphysis fracture incompletely imaged. IMPRESSION: 1. Decreased size of right pleural effusion (now small) following right-sided thoracentesis. No definitive evidence of pneumothorax. 2. Bibasilar areas of atelectasis and/or consolidation (left greater than right) with persistent small to moderate left pleural effusion. 3. Cardiomegaly. 4. Aortic atherosclerosis.  Electronically Signed   By: Vinnie Langton M.D.   On: 04/30/2021 12:24   DG Chest 2 View  Result Date: 04/29/2021 CLINICAL DATA:  Fall.  Blood thinners. EXAM: CHEST - 2 VIEW COMPARISON:  Chest x-ray 04/04/2021 FINDINGS: The heart and mediastinal contours are unchanged. Aortic calcification. Biapical pleural/pulmonary scarring. Bilateral lower lobe patchy airspace opacities. No pulmonary edema. At least small volume bilateral pleural effusions. No pneumothorax. No acute osseous abnormality. Compression fracture status post kyphoplasty of likely the L1 level. IMPRESSION: Interval development of at least small volume bilateral pleural effusions. Associated bilateral lower lung zone airspace opacities that may represent infection/inflammation versus atelectasis. Followup PA and lateral chest X-ray is recommended in 3-4 weeks following therapy to ensure resolution and exclude underlying malignancy. Electronically Signed   By: Iven Finn M.D.   On: 04/29/2021 18:07   DG Pelvis 1-2 Views  Result Date: 04/29/2021 CLINICAL DATA:  Status post fall.  On blood thinners. EXAM: PELVIS - 1-2 VIEW COMPARISON:  X-ray pelvis 04/04/2021 FINDINGS: Markedly limited evaluation due to overlapping osseous structures and overlying soft tissues. There is no evidence of pelvic fracture or diastasis. No aggressive appearing pelvic bone lesions are seen. Old healed  right femoral fracture status post intramedullary nail fixation. No dislocation of the right hip. No dislocation of the left hip. No definite acute displaced fracture of the left hip on frontal view. Sacrum grossly unremarkable. Degenerative changes of the visualized lower lumbar spine. Phleboliths overlie the pelvis. IMPRESSION: Negative for acute traumatic injury. Electronically Signed   By: Iven Finn M.D.   On: 04/29/2021 18:09   CT HEAD WO CONTRAST (5MM)  Result Date: 04/29/2021 CLINICAL DATA:  [place], head trauma EXAM: CT HEAD WITHOUT CONTRAST  TECHNIQUE: Contiguous axial images were obtained from the base of the skull through the vertex without intravenous contrast. COMPARISON:  04/04/2021 FINDINGS: Brain: No signs of acute infarct or hemorrhage. Stable hypodensities throughout the periventricular white matter consistent with chronic small vessel ischemic change. Lateral ventricles and midline structures are unremarkable. No acute extra-axial fluid collections. No mass effect. Vascular: No hyperdense vessel or unexpected calcification. Stable atherosclerosis. Skull: Right parietal scalp hematoma. No underlying fractures. Remainder of the calvarium is unremarkable. Sinuses/Orbits: No acute finding. Other: None. IMPRESSION: 1. Right parietal scalp hematoma. 2. No acute intracranial process. Electronically Signed   By: Randa Ngo M.D.   On: 04/29/2021 17:29   CT Angio Chest PE W and/or Wo Contrast  Result Date: 04/29/2021 CLINICAL DATA:  Chest pain and shortness of breath. EXAM: CT ANGIOGRAPHY CHEST WITH CONTRAST TECHNIQUE: Multidetector CT imaging of the chest was performed using the standard protocol during bolus administration of intravenous contrast. Multiplanar CT image reconstructions and MIPs were obtained to evaluate the vascular anatomy. CONTRAST:  55mL OMNIPAQUE IOHEXOL 350 MG/ML SOLN COMPARISON:  April 04, 2021 FINDINGS: Cardiovascular: Marked severity calcification of the aortic arch is seen without evidence of aortic aneurysm or dissection. Satisfactory opacification of the pulmonary arteries to the segmental level. No evidence of pulmonary embolism. Mild cardiomegaly with marked severity coronary artery calcification. No pericardial effusion. Mediastinum/Nodes: Mild pretracheal and AP window lymphadenopathy is seen. Thyroid gland, trachea, and esophagus demonstrate no significant findings. Lungs/Pleura: Stable, moderate severity areas of biapical scarring and/or atelectasis are seen. Mild anterior right upper lobe, mild inferior  left upper lobe and moderate to marked severity bibasilar atelectasis and/or infiltrate is seen. This is a new finding when compared to the prior study. Moderate severity bilateral pleural effusions are noted, right greater than left. These are both increased in size when compared to the prior study. No pneumothorax is identified. Upper Abdomen: A 2.3 cm x 1.9 cm low-attenuation left adrenal mass is seen (approximately 23.17 Hounsfield units). Musculoskeletal: Multiple chronic left-sided rib fractures are seen. A compression fracture deformity is seen at the level of T9 with prior vertebroplasty noted at the level of L1. Review of the MIP images confirms the above findings. IMPRESSION: 1. No evidence of pulmonary embolus. 2. Moderate severity bilateral pleural effusions, right greater than left, increased in size when compared to the prior study. 3. Mild anterior right upper lobe, mild inferior left upper lobe and moderate to marked severity bibasilar atelectasis and/or infiltrate. 4. Stable cardiomegaly with marked severity coronary artery calcification. 5. Low-attenuation left adrenal mass which may represent an adrenal adenoma. Further evaluation with nonemergent adrenal protocol CT is recommended. Aortic Atherosclerosis (ICD10-I70.0). Electronically Signed   By: Virgina Norfolk M.D.   On: 04/29/2021 22:15   CT Cervical Spine Wo Contrast  Result Date: 04/29/2021 CLINICAL DATA:  [place] EXAM: CT CERVICAL SPINE WITHOUT CONTRAST TECHNIQUE: Multidetector CT imaging of the cervical spine was performed without intravenous contrast. Multiplanar CT image reconstructions were also generated. COMPARISON:  04/04/2021 FINDINGS: Alignment: Reversal cervical lordosis centered at C4 unchanged, likely due to multilevel spondylosis. Otherwise alignment is anatomic. Skull base and vertebrae: No acute fracture. No primary bone lesion or focal pathologic process. Soft tissues and spinal canal: No prevertebral fluid or swelling.  No visible canal hematoma. Disc levels: Extensive multilevel spondylosis and facet hypertrophy again identified, most pronounced from C3-4 through C5-6. Upper chest: Mucoid material within the trachea. Right pleural effusion is noted. Other: Reconstructed images demonstrate no additional findings. IMPRESSION: 1. No acute cervical spine fracture. 2. Stable multilevel spondylosis and facet hypertrophy. Electronically Signed   By: Randa Ngo M.D.   On: 04/29/2021 17:27    Scheduled Meds:  diltiazem  120 mg Oral Daily   furosemide  20 mg Intravenous Q12H   lidocaine       magnesium oxide  400 mg Oral Daily   metoprolol succinate  50 mg Oral Daily    Continuous Infusions:  sodium chloride 250 mL (04/29/21 2317)     LOS: 1 day     Kayleen Memos, MD [facility] Pager [phone]  If 7PM-7AM, please contact night-coverage [email or web address] Password [email or web address] 04/30/2021, 1:00 PM

## 2021-04-30 NOTE — ED Notes (Signed)
[place] received and applied, remains alert, NAD, calm, interactive, resps e/u, mildly fidgety, not restless, agitated or pulling at lines. Remains confused, follows some directions.

## 2021-04-30 NOTE — ED Notes (Signed)
IR waiting for family for consent.

## 2021-04-30 NOTE — Progress Notes (Signed)
IR was requested for image guided thoracentesis.   Called both son and daughter to obtain consent, no answer and unable to leave VM for both.   Will call to obtain consent again later.    Prestina Raigoza Lemmie Evens Kavari Parrillo PA-C 04/30/2021 8:01 AM

## 2021-04-30 NOTE — TOC CAGE-AID Note (Signed)
Transition of Care [facility]) - CAGE-AID Screening   Patient Details  Name: [name] MRN: [id number] Date of Birth: 1929-07-28  Transition of [facility]) CM/SW Contact:    Florena Kozma C Tarpley-Carter, [place] Phone Number: 04/30/2021, 9:37 AM   Clinical Narrative: Pt is unable to participate in Cage Aid.  Pt has dementia.  Patricia Perales Tarpley-Carter, MSW, LCSW-A Pronouns:  She/Her/Hers [facility]itions of Care Clinical Social Worker Direct Number:  [phone] [email or web address]   CAGE-AID Screening: Substance Abuse Screening unable to be completed due to: : Patient unable to participate (Pt has dementia.)             Substance Abuse Education Offered: No

## 2021-04-30 NOTE — ED Notes (Signed)
Back fro IR, no changes, VSS, afib RVR, HR 95-120, sleeping, arousable to voice, interactive, remains confused to baseline.

## 2021-04-30 NOTE — ED Notes (Signed)
Pt alert, NAD, calm, interactive, resps e/u, speaking clearly, denies pain, nausea, or sob. VSS. Afib on monitor 95-122.

## 2021-05-01 MED ORDER — FUROSEMIDE 10 MG/ML IJ SOLN
20.0000 mg | Freq: Every day | INTRAMUSCULAR | Status: DC
  Administered 2021-05-02 – 2021-05-03 (×2): 20 mg via INTRAVENOUS
  Filled 2021-05-01 (×2): qty 2

## 2021-05-01 NOTE — Evaluation (Signed)
Occupational Therapy Evaluation Patient Details Name: [name] MRN: [id number] DOB: [birth year] Today's Date: 05/01/2021   History of Present Illness Pt is an 87 y.o. female admitted 10/13 with dx of acute resp failure with hypoxia. She presented to the ED from SNF following multiple falls. Xrays negative. Chest CT revealed bilat pleural effusions. She underwent thoracentesis 10/14. PMH: dementia, a fib   Clinical Impression   Pt admitted for concerns listed above. PTA pt was living at [facility], receiving assist for transfers and ADL's. At this time, unsure what levels of assist pt was receiving. Due to pt lethargy/level of arousal, pt requiring increased assist with all ADL's, min A for UB and Max A for LB. Pt remained EOB this session for a short time as she immediately wanted to return to supine. Recommend return to SNF and OT will follow acutely.       Recommendations for follow up therapy are one component of a multi-disciplinary discharge planning process, led by the attending physician.  Recommendations may be updated based on patient status, additional functional criteria and insurance authorization.   Follow Up Recommendations  SNF;Supervision/Assistance - 24 hour    Equipment Recommendations  None recommended by OT    Recommendations for Other Services       Precautions / Restrictions Precautions Precautions: Fall Restrictions Weight Bearing Restrictions: No      Mobility Bed Mobility Overal bed mobility: Needs Assistance Bed Mobility: Supine to Sit;Sit to Supine     Supine to sit: Mod assist;HOB elevated Sit to supine: Min assist;HOB elevated   General bed mobility comments: decreased initiation due to lethargy requiring increased assist to transition to EOB. Decreased assist for sit to supine as pt more motivated to return to bed.    Transfers                 General transfer comment: unable to progress beyond EOB due to lethargy     Balance Overall balance assessment: Needs assistance Sitting-balance support: Bilateral upper extremity supported;Feet supported Sitting balance-Leahy Scale: Fair                                     ADL either performed or assessed with clinical judgement   ADL Overall ADL's : Needs assistance/impaired Eating/Feeding: Set up;Minimal assistance;Cueing for sequencing;Sitting   Grooming: Set up;Sitting;Cueing for sequencing   Upper Body Bathing: Set up;Min guard;Cueing for sequencing;Sitting   Lower Body Bathing: Maximal assistance;Bed level   Upper Body Dressing : Min guard;Minimal assistance;Cueing for sequencing;Sitting   Lower Body Dressing: Maximal assistance;Bed level       Toileting- Clothing Manipulation and Hygiene: Maximal assistance;Bed level         General ADL Comments: Pt remained EOB thissession due to level of arousal and participation. Unable to fully assess any transfers or LB ADL's besides bed level     Vision Baseline Vision/History: 1 Wears glasses Ability to See in Adequate Light: 1 Impaired Patient Visual Report: No change from baseline Vision Assessment?: No apparent visual deficits Additional Comments: Pt did not open her eyes throughout session     Perception Perception Perception Tested?: No   Praxis Praxis Praxis tested?: Not tested    Pertinent Vitals/Pain Pain Assessment: Faces Faces Pain Scale: Hurts little more Pain Location: BLE with mobility Pain Descriptors / Indicators: Grimacing;Discomfort Pain Intervention(s): Limited activity within patient's tolerance;Monitored during session;Repositioned     Hand Dominance Right  Extremity/Trunk Assessment Upper Extremity Assessment Upper Extremity Assessment: Overall WFL for tasks assessed   Lower Extremity Assessment Lower Extremity Assessment: Defer to PT evaluation   Cervical / Trunk Assessment Cervical / Trunk Assessment: Kyphotic   Communication  Communication Communication: No difficulties   Cognition Arousal/Alertness: Lethargic Behavior During Therapy: Flat affect Overall Cognitive Status: History of cognitive impairments - at baseline                                 General Comments: very lethargic. Follows simple commands inconsistently due to drifting off to sleep. Eyes closed throughout session.   General Comments  VSS on RA, pt very lethargic, difficulty arousing and did not open her eyes this session.    Exercises     Shoulder Instructions      Home Living Family/patient expects to be discharged to:: Skilled nursing facility                                 Additional Comments: [place]      Prior Functioning/Environment Level of Independence: Needs assistance  Gait / Transfers Assistance Needed: Pt reports ambulating without AD. Unsure of validity of information provided. h/o dementia. ADL's / Homemaking Assistance Needed: Pt recieves assist for all ADL's   Comments: SNF resident        OT Problem List: Decreased strength;Decreased activity tolerance;Impaired balance (sitting and/or standing);Decreased coordination;Decreased cognition;Decreased safety awareness;Decreased knowledge of use of DME or AE      OT Treatment/Interventions: Self-care/ADL training;Therapeutic exercise;Energy conservation;DME and/or AE instruction;Therapeutic activities    OT Goals(Current goals can be found in the care plan section) Acute Rehab OT Goals Patient Stated Goal: not stated OT Goal Formulation: Patient unable to participate in goal setting Time For Goal Achievement: 05/15/21 Potential to Achieve Goals: Fair ADL Goals Pt Will Perform Grooming: with modified independence;sitting Pt Will Perform Lower Body Bathing: with mod assist;sitting/lateral leans;sit to/from stand Pt Will Perform Lower Body Dressing: with min assist;sitting/lateral leans;sit to/from stand Pt Will Transfer to  Toilet: with mod assist;stand pivot transfer Pt Will Perform Toileting - Clothing Manipulation and hygiene: with min assist;with mod assist;sitting/lateral leans;sit to/from stand  OT Frequency: Min 2X/week   Barriers to D/C:            Co-evaluation              AM-PAC OT "6 Clicks" Daily Activity     Outcome Measure Help from another person eating meals?: A Little Help from another person taking care of personal grooming?: A Little Help from another person toileting, which includes using toliet, bedpan, or urinal?: A Lot Help from another person bathing (including washing, rinsing, drying)?: A Lot Help from another person to put on and taking off regular upper body clothing?: A Little Help from another person to put on and taking off regular lower body clothing?: A Lot 6 Click Score: 15   End of Session Equipment Utilized During Treatment: Gait belt Nurse Communication: Mobility status  Activity Tolerance: Patient limited by lethargy Patient left: in bed;with call bell/phone within reach;with bed alarm set  OT Visit Diagnosis: Unsteadiness on feet (R26.81);Other abnormalities of gait and mobility (R26.89);Muscle weakness (generalized) (M62.81)                Time: 1941-7408 OT Time Calculation (min): 14 min Charges:  OT General Charges $OT Visit: 1  Visit OT Evaluation $OT Eval Moderate Complexity: 1 Mod  Taetum Flewellen H., OTR/L Acute Rehabilitation  Deaveon Schoen Elane Yolanda Bonine 05/01/2021, 1:51 PM

## 2021-05-01 NOTE — Progress Notes (Addendum)
     Referral received for [name] :goals of care discussion. Chart reviewed and updates received.. Patient assessed and is unable to engage appropriately in discussions. Attempted to contact patient's son, [name] (unable to leave VM, not set-up) and daughter, [name] (also unable to leave VM).   Attempted to reach family @ 1030, 1225, 1615.   PMT will re-attempt to contact family at a later time/date. Detailed note and recommendations to follow once GOC has been completed.   Thank you for your referral and allowing PMT to assist in Mrs. [name] care.   Alda Lea, AGPCNP-BC Palliative Medicine Team  Phone: [phone] Pager: [phone] Amion: N. [place]   NO CHARGE

## 2021-05-01 NOTE — Progress Notes (Signed)
SATURATION QUALIFICATIONS: (This note is used to comply with regulatory documentation for home oxygen)  Patient Saturations on Room Air at Rest = 95 %  Patient Saturations on Room Air while Ambulating = unable to ambulate.Not tested

## 2021-05-01 NOTE — Evaluation (Signed)
Physical Therapy Evaluation Patient Details Name: [name] MRN: [id number] DOB: 02-21-1930 Today's Date: 05/01/2021  History of Present Illness  Pt is an 87 y.o. female admitted 10/13 with dx of acute resp failure with hypoxia. She presented to the ED from SNF following multiple falls. Xrays negative. Chest CT revealed bilat pleural effusions. She underwent thoracentesis 10/14. PMH: dementia, a fib   Clinical Impression  Pt admitted with above diagnosis. PTA pt resided in SNF. Pt reports she ambulated without AD, but unsure of validity of information (h/o dementia, no family present). On eval, pt required min/mod assist bed mobility. Unable to progress beyond EOB due to lethargy. Pt will benefit from skilled PT to increase their independence and safety with mobility to allow discharge to the venue listed below.          Recommendations for follow up therapy are one component of a multi-disciplinary discharge planning process, led by the attending physician.  Recommendations may be updated based on patient status, additional functional criteria and insurance authorization.  Follow Up Recommendations SNF    Equipment Recommendations  Other (comment) (defer to next venue)    Recommendations for Other Services       Precautions / Restrictions Precautions Precautions: Fall Restrictions Weight Bearing Restrictions: No      Mobility  Bed Mobility Overal bed mobility: Needs Assistance Bed Mobility: Supine to Sit;Sit to Supine     Supine to sit: Mod assist;HOB elevated Sit to supine: Min assist;HOB elevated   General bed mobility comments: decreased initiation due to lethargy requiring increased assist to transition to EOB. Decreased assist for sit to supine as pt more motivated to return to bed.    Transfers                 General transfer comment: unable to progress beyond EOB due to lethargy  Ambulation/Gait             General Gait Details: unable due to  lethargy  Stairs            Wheelchair Mobility    Modified Rankin (Stroke Patients Only)       Balance Overall balance assessment: Needs assistance Sitting-balance support: Bilateral upper extremity supported;Feet supported Sitting balance-Leahy Scale: Fair                                       Pertinent Vitals/Pain Pain Assessment: Faces Faces Pain Scale: Hurts little more Pain Location: BLE with mobility Pain Descriptors / Indicators: Grimacing;Discomfort Pain Intervention(s): Limited activity within patient's tolerance;Monitored during session    Home Living Family/patient expects to be discharged to:: Skilled nursing facility                 Additional Comments: [place]    Prior Function Level of Independence: Needs assistance   Gait / Transfers Assistance Needed: Pt reports ambulating without AD. Unsure of validity of information provided. h/o dementia.     Comments: SNF resident     Hand Dominance        Extremity/Trunk Assessment   Upper Extremity Assessment Upper Extremity Assessment: Defer to OT evaluation    Lower Extremity Assessment Lower Extremity Assessment: Generalized weakness    Cervical / Trunk Assessment Cervical / Trunk Assessment: Kyphotic  Communication      Cognition Arousal/Alertness: Lethargic Behavior During Therapy: Flat affect Overall Cognitive Status: Difficult to assess  General Comments: very lethargic. Follows simple commands inconsistently due to drifting off to sleep. Eyes closed throughout session.      General Comments General comments (skin integrity, edema, etc.): VSS on RA    Exercises     Assessment/Plan    PT Assessment Patient needs continued PT services  PT Problem List Decreased strength;Decreased mobility;Decreased safety awareness;Decreased activity tolerance;Cardiopulmonary status limiting activity;Decreased  balance;Pain       PT Treatment Interventions DME instruction;Therapeutic activities;Cognitive remediation;Gait training;Therapeutic exercise;Patient/family education;Balance training;Functional mobility training    PT Goals (Current goals can be found in the Care Plan section)  Acute Rehab PT Goals Patient Stated Goal: not stated PT Goal Formulation: Patient unable to participate in goal setting Time For Goal Achievement: 05/15/21 Potential to Achieve Goals: Fair    Frequency Min 2X/week   Barriers to discharge        Co-evaluation               AM-PAC PT "6 Clicks" Mobility  Outcome Measure Help needed turning from your back to your side while in a flat bed without using bedrails?: A Little Help needed moving from lying on your back to sitting on the side of a flat bed without using bedrails?: A Lot Help needed moving to and from a bed to a chair (including a wheelchair)?: A Lot Help needed standing up from a chair using your arms (e.g., wheelchair or bedside chair)?: A Lot Help needed to walk in hospital room?: Total Help needed climbing 3-5 steps with a railing? : Total 6 Click Score: 11    End of Session   Activity Tolerance: Patient limited by lethargy Patient left: in bed;with call bell/phone within reach;with bed alarm set Nurse Communication: Mobility status PT Visit Diagnosis: Other abnormalities of gait and mobility (R26.89);Muscle weakness (generalized) (M62.81);Pain    Time: [id number] PT Time Calculation (min) (ACUTE ONLY): 11 min   Charges:   PT Evaluation $PT Eval Moderate Complexity: 1 Mod          Lorrin Goodell, PT  Office # [phone] Pager [phone]   Lorriane Shire 05/01/2021, 1:31 PM

## 2021-05-01 NOTE — Progress Notes (Signed)
PROGRESS NOTE  [name] ZOX:[id number] DOB: 1930/04/17 DOA: 04/29/2021 PCP: Garwin Brothers, MD  HPI/Recap of past 56 hours: 87 year old female with a history of chronic atrial fibrillation on Eliquis, dementia with behavioral disturbance, hypertension presents to the ER from the nursing home due to repeated falls on the day of presentation.  Patient has a history of A. fib is on Eliquis.  Patient evaluated in the ER as a trauma patient.  Trauma scans were all negative.  Patient noted to have atrial fibrillation.  CT scan chest performed due to persistent hypoxia demonstrated bilateral pleural effusions right greater than left that have significantly increased in size since her scan in September 2022.  Plan for right thoracentesis on 04/30/2021 by IR.  Patient recently seen by palliative care as an outpatient.  Son was hopeful that the patient could go to [facility] under hospice care.  He would like the patient's pleural effusions to be dealt with prior to discussing inpatient hospice.  Son does confirm patient is a DNR/DNI.   05/01/2021: Patient was seen and examined at bedside.  She is drowsy however she is easily arousable to voices.  She denies any complaints, in the setting of advanced dementia.    Assessment/Plan: Principal Problem:   Acute respiratory failure with hypoxia (HCC) Active Problems:   Bilateral pleural effusion   Fall   A-fib [facility])   Benign essential HTN   DNR (do not resuscitate)/DNI(Do not intubate)   Dementia with behavioral disturbance  Acute hypoxic respiratory failure secondary to bilateral pleural effusions, right greater than left, Post thoracentesis on 04/30/2021 by interventional radiologist, 600 cc of hazy yellow fluid was removed. Eliquis discontinued due to high fall risk. She is currently on room air with O2 saturation of 97%.  Acute systolic CHF 2D echo done on 04/30/2021 showed LVEF 45 to 50%, left ventricle demonstrate global hypokinesis, there  is the interventricular septum flattened in systole and diastolic consistent with right ventricular pressure and volume overload.  Right ventricular systolic function is moderately reduced.  The right ventricular size is moderately enlarged, there is severely elevated pulmonary artery systolic pressure.  Left atrial size was moderately dilated, right atrial size was moderately dilated.  Small pericardial effusion is present.  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 Continue to hold off Eliquis in the setting of multiple falls.    Benign essential HTN BPs are soft, reduce IV Lasix 20 mg to daily from twice daily Continue home Cardizem and Lopressor.   Ambulatory dysfunction with multiple falls.  No fractures seen on xrays and CT today.  Pt with repeated falls at SNF.  Systemic anticoagulation is contraindicated.  Previous provider discussed with son [name] who agrees.   Eliquis stopped permanently.     Dementia with behavioral disturbance Continue fall precautions. Reorient as needed.   DVT prophylaxis: SCDs Code Status: DNR/DNI(Do NOT Intubate) verified with pt's son    Family Communication: None at bedside.  Disposition Plan: SNF vs hospice house  Consults called: none  Admission status: Inpatient, Telemetry bed         Objective: Vitals:   04/30/21 2136 05/01/21  0500 05/01/21 0701 05/01/21 1223  BP: 129/71  118/80 95/60  Pulse: 98  97 98  Resp: 17  16 17   Temp: 99.2 F (37.3 C)  97.7 F (36.5 C) 97.8 F (36.6 C)  TempSrc: Oral  Oral Oral  SpO2: 93%  93% 97%  Weight:  44.5 kg    Height:        Intake/Output Summary (Last 24 hours) at 05/01/2021 1523 Last data filed at 05/01/2021 1200 Gross per 24 hour  Intake 270 ml  Output 550 ml  Net -280 ml   Filed Weights   04/29/21 1651 05/01/21 0500  Weight: 45.4 kg 44.5 kg    Exam:  General: 87 y.o. year-old female frail-appearing in no acute distress.  She is somnolent but easily arousable to voices.   Cardiovascular: Irregular rate and rhythm no rubs or gallops.  Respiratory: Mild rales at bases with no wheezing noted.  Poor inspiratory effort.   Abdomen: Submental normal bowel sounds present.. Musculoskeletal: Trace lower extremity edema bilaterally. Skin: No ulcerative lesions noted.   Psychiatry: Mood is appropriate for condition and setting.  Data Reviewed: CBC: Recent Labs  Lab 04/29/21 1754 04/30/21 0600  WBC 12.5* 10.0  NEUTROABS 10.8* 8.0*  HGB 12.5 10.5*  HCT 41.2 33.2*  MCV 102.0* 99.4  PLT 328 161   Basic Metabolic Panel: Recent Labs  Lab 04/29/21 1754 04/30/21 0600  NA 139 140  K 3.4* 3.8  CL 103 106  CO2 27 26  GLUCOSE 130* 122*  BUN 20 16  CREATININE 1.03* 0.88  CALCIUM 9.0 8.6*  MG  --  1.9   GFR: Estimated Creatinine Clearance: 30.4 mL/min (by C-G formula based on SCr of 0.88 mg/dL). Liver Function Tests: Recent Labs  Lab 04/30/21 0600  AST 16  ALT 19  ALKPHOS 121  BILITOT 1.0  PROT 5.4*  ALBUMIN 2.1*   No results for input(s): LIPASE, AMYLASE in the last 168 hours. No results for input(s): AMMONIA in the last 168 hours. Coagulation Profile: No results for input(s): INR, PROTIME in the last 168 hours. Cardiac Enzymes: No results for input(s): CKTOTAL, CKMB, CKMBINDEX, TROPONINI in the last 168 hours. BNP (last 3 results) No results  for input(s): PROBNP in the last 8760 hours. HbA1C: No results for input(s): HGBA1C in the last 72 hours. CBG: No results for input(s): GLUCAP in the last 168 hours. Lipid Profile: No results for input(s): CHOL, HDL, LDLCALC, TRIG, CHOLHDL, LDLDIRECT in the last 72 hours. Thyroid Function Tests: No results for input(s): TSH, T4TOTAL, FREET4, T3FREE, THYROIDAB in the last 72 hours. Anemia Panel: No results for input(s): VITAMINB12, FOLATE, FERRITIN, TIBC, IRON, RETICCTPCT in the last 72 hours. Urine analysis:    Component Value Date/Time   COLORURINE YELLOW 04/29/2021 2234   APPEARANCEUR CLOUDY (A) 04/29/2021 2234   LABSPEC 1.020 04/29/2021 2234   PHURINE 7.0 04/29/2021 2234   GLUCOSEU NEGATIVE 04/29/2021 2234   HGBUR MODERATE (A) 04/29/2021 2234   [place] 04/29/2021 [place] 04/29/2021 2234   PROTEINUR 30 (A) 04/29/2021 2234   NITRITE NEGATIVE 04/29/2021 2234   LEUKOCYTESUR LARGE (A) 04/29/2021 2234   Sepsis Labs: @LABRCNTIP (procalcitonin:4,lacticidven:4)  ) Recent Results (from the past 240 hour(s))  Urine Culture     Status: Abnormal (Preliminary result)   Collection Time: 04/29/21  5:05 PM   Specimen: Urine, Clean Catch  Result Value Ref Range Status   Specimen Description URINE, CLEAN CATCH  Final   Special Requests NONE  Final   Culture (A)  Final    >=100,000 COLONIES/mL ENTEROCOCCUS FAECALIS SUSCEPTIBILITIES TO FOLLOW Performed at [facility], [address], [place], [place] [address]    Report Status PENDING  Incomplete  Resp Panel by RT-PCR (Flu A&B, Covid) Nasopharyngeal Swab     Status: None   Collection Time: 04/29/21  7:08 PM   Specimen: Nasopharyngeal Swab; Nasopharyngeal(NP) swabs in vial transport medium  Result Value Ref Range Status   SARS Coronavirus 2 by RT PCR NEGATIVE NEGATIVE Final    Comment: (NOTE) SARS-CoV-2 target nucleic acids are NOT DETECTED.  The SARS-CoV-2 RNA is generally detectable in upper  respiratory specimens during the acute phase of infection. The lowest concentration of SARS-CoV-2 viral copies this assay can detect is 138 copies/mL. A negative result does not preclude SARS-Cov-2 infection and should not be used as the sole basis for treatment or other patient management decisions. A negative result may occur with  improper specimen collection/handling, submission of specimen other than nasopharyngeal swab, presence of viral mutation(s) within the areas targeted by this assay, and inadequate number of viral copies(<138 copies/mL). A negative result must be combined with clinical observations, patient history, and epidemiological information. The expected result is Negative.  Fact Sheet for Patients:  EntrepreneurPulse.com.au  Fact Sheet for Healthcare Providers:  IncredibleEmployment.be  This test is no t yet approved or cleared by the [place] FDA and  has been authorized for detection and/or diagnosis of SARS-CoV-2 by FDA under an Emergency Use Authorization (EUA). This EUA will remain  in effect (meaning this test can be used) for the duration of the COVID-19 declaration under Section 564(b)(1) of the Act, 21 U.S.C.section 360bbb-3(b)(1), unless the authorization is terminated  or revoked sooner.       Influenza A by PCR NEGATIVE NEGATIVE Final   Influenza B by PCR NEGATIVE NEGATIVE Final    Comment: (NOTE) The Xpert Xpress SARS-CoV-2/FLU/RSV plus assay is intended as an aid in the diagnosis of influenza from Nasopharyngeal swab specimens and should not be used as a sole basis for treatment. Nasal washings and aspirates are unacceptable for Xpert Xpress SARS-CoV-2/FLU/RSV testing.  Fact Sheet for Patients: EntrepreneurPulse.com.au  Fact Sheet for Healthcare Providers: IncredibleEmployment.be  This test is not yet approved or cleared by the [place] FDA and has been  authorized for detection and/or diagnosis of SARS-CoV-2 by FDA under an Emergency Use Authorization (EUA). This EUA will remain in effect (meaning this test can be used) for the duration of the COVID-19 declaration under Section 564(b)(1) of the Act, 21 U.S.C. section 360bbb-3(b)(1), unless the authorization is terminated or revoked.  Performed at [facility], [place] [address] [place], [place] [address]   Body fluid culture w Gram Stain     Status: None (Preliminary result)   Collection Time: 04/30/21 11:40 AM   Specimen: Lung, Right; Pleural Fluid  Result Value Ref Range Status   Specimen Description PLEURAL FLUID  Final   Special Requests LUNG RIGHT  Final   Gram Stain   Final    WBC PRESENT,BOTH PMN AND MONONUCLEAR NO ORGANISMS SEEN CYTOSPIN SMEAR    Culture   Final    NO GROWTH < 24 HOURS Performed at [facility], [place] [address], [place], [place] [address]    Report Status PENDING  Incomplete      Studies: ECHOCARDIOGRAM COMPLETE  Result Date: 04/30/2021    ECHOCARDIOGRAM REPORT   Patient Name:   [name] Date of Exam: 04/30/2021 Medical Rec #:  [id number]    Height:       65.0 in Accession #:    [id number]   Weight:       100.0 lb Date of Birth:  1930-05-22    BSA:          1.473 m Patient Age:    64 years     BP:           127/88 mmHg Patient Gender: F            HR:           130 bpm. Exam Location:  Inpatient Procedure: 2D Echo, Cardiac Doppler and Color Doppler Indications:    AFIB  History:        Patient has no prior history of Echocardiogram examinations.                 Arrythmias:Atrial Fibrillation; Risk Factors:Hypertension.  Sonographer:    Meagan Baucom RDCS, FE, PE Referring Phys: [place]  1. Left ventricular ejection fraction, by estimation, is 45 to 50%. The left ventricle has mildly decreased function. The left ventricle demonstrates global hypokinesis. Left ventricular diastolic function could not be evaluated. There is the  interventricular septum is flattened in systole and diastole, consistent with right ventricular pressure and volume overload.  2. Right ventricular systolic function is moderately reduced. The right ventricular size is moderately enlarged. There is severely elevated pulmonary artery systolic pressure.  3. Left atrial size was moderately dilated.  4. Right atrial size was moderately dilated.  5. A small pericardial effusion is present. The pericardial effusion is posterior to the left ventricle.  6. The mitral valve is myxomatous. Mild to moderate mitral valve regurgitation. There is mild late systolic prolapse of both leaflets of the mitral valve.  7. The tricuspid valve is myxomatous. Tricuspid valve regurgitation is moderate to severe.  8. The aortic valve is tricuspid. There is moderate thickening of the aortic valve. Aortic valve regurgitation is mild. Mild to moderate aortic valve sclerosis/calcification is present, without any evidence of aortic stenosis.  9. The inferior vena cava is dilated in size with <50% respiratory variability, suggesting right atrial pressure of 15 mmHg. FINDINGS  Left Ventricle: Left ventricular ejection fraction, by estimation, is 45 to 50%. The left ventricle has mildly decreased function. The left ventricle demonstrates global hypokinesis. The left ventricular internal cavity size was normal in size. There is  no left ventricular hypertrophy. The interventricular septum is flattened in systole and diastole, consistent with right ventricular pressure and volume overload. Left ventricular diastolic function could not be evaluated due to atrial fibrillation. Left ventricular diastolic function could not be evaluated. Right Ventricle: The right ventricular size is moderately enlarged. No increase in right ventricular wall thickness. Right ventricular systolic function is moderately reduced. There is severely elevated pulmonary artery systolic pressure. The tricuspid regurgitant  velocity is 3.53 m/s, and with an assumed right atrial pressure of 15 mmHg, the estimated right ventricular systolic pressure is 43.1 mmHg. Left Atrium: Left atrial size was moderately dilated. Right Atrium: Right atrial size was moderately dilated. Pericardium: A small pericardial effusion is present. The pericardial effusion is posterior to the left ventricle. Mitral Valve: The mitral valve is myxomatous. There is mild late systolic prolapse of both leaflets of the mitral  valve. Mild to moderate mitral valve regurgitation, with centrally-directed jet. Tricuspid Valve: The tricuspid valve is myxomatous. Tricuspid valve regurgitation is moderate to severe. There is mild prolapse of the tricuspid. The flow in the hepatic veins is reversed during ventricular systole. Aortic Valve: The aortic valve is tricuspid. There is moderate thickening of the aortic valve. Aortic valve regurgitation is mild. Mild to moderate aortic valve sclerosis/calcification is present, without any evidence of aortic stenosis. Pulmonic Valve: The pulmonic valve was normal in structure. Pulmonic valve regurgitation is trivial. Aorta: The aortic root and ascending aorta are structurally normal, with no evidence of dilitation. Venous: The inferior vena cava is dilated in size with less than 50% respiratory variability, suggesting right atrial pressure of 15 mmHg. IAS/Shunts: No atrial level shunt detected by color flow Doppler.  LEFT VENTRICLE PLAX 2D LVIDd:         3.70 cm   Diastology LVIDs:         3.00 cm   LV e' medial: 3.92 cm/s LV PW:         0.90 cm LV IVS:        0.90 cm LVOT diam:     1.70 cm LV SV:         23 LV SV Index:   16 LVOT Area:     2.27 cm  LEFT ATRIUM             Index        RIGHT ATRIUM           Index LA diam:        3.86 cm 2.62 cm/m   RA Area:     19.70 cm LA Vol (A2C):   61.2 ml 41.54 ml/m  RA Volume:   57.30 ml  38.89 ml/m LA Vol (A4C):   40.8 ml 27.69 ml/m LA Biplane Vol: 51.0 ml 34.61 ml/m  AORTIC VALVE LVOT  Vmax:   78.30 cm/s LVOT Vmean:  49.800 cm/s LVOT VTI:    0.103 m  AORTA Ao Root diam: 3.30 cm Ao Asc diam:  3.10 cm TRICUSPID VALVE TR Peak grad:   49.8 mmHg TR Vmax:        353.00 cm/s  SHUNTS Systemic VTI:  0.10 m Systemic Diam: 1.70 cm Mihai Croitoru MD Electronically signed by Sanda Klein MD Signature Date/Time: 04/30/2021/7:52:11 PM    Final     Scheduled Meds:  diltiazem  120 mg Oral Daily   furosemide  20 mg Intravenous Q12H   magnesium oxide  400 mg Oral Daily   metoprolol succinate  50 mg Oral Daily    Continuous Infusions:  sodium chloride 250 mL (04/29/21 2317)     LOS: 2 days     Kayleen Memos, MD [facility] Pager [phone]  If 7PM-7AM, please contact night-coverage [email or web address] Password [facility] 05/01/2021, 3:23 PM

## 2021-05-01 NOTE — Progress Notes (Signed)
Pt has been calm and cooperative. No need to apply restraints, less restrictive methods have been used. Will continue to monitor and tx as indicated.

## 2021-05-02 LAB — CBC
HCT: 37.3 % (ref 36.0–46.0)
Hemoglobin: 12.1 g/dL (ref 12.0–15.0)
MCH: 31.3 pg (ref 26.0–34.0)
MCHC: 32.4 g/dL (ref 30.0–36.0)
MCV: 96.6 fL (ref 80.0–100.0)
Platelets: 307 10*3/uL (ref 150–400)
RBC: 3.86 MIL/uL — ABNORMAL LOW (ref 3.87–5.11)
RDW: 14 % (ref 11.5–15.5)
WBC: 10.4 10*3/uL (ref 4.0–10.5)
nRBC: 0 % (ref 0.0–0.2)

## 2021-05-02 LAB — BASIC METABOLIC PANEL
Anion gap: 11 (ref 5–15)
Anion gap: 17 — ABNORMAL HIGH (ref 5–15)
BUN: 23 mg/dL (ref 8–23)
BUN: 30 mg/dL — ABNORMAL HIGH (ref 8–23)
CO2: 28 mmol/L (ref 22–32)
CO2: 23 mmol/L (ref 22–32)
Calcium: 8.3 mg/dL — ABNORMAL LOW (ref 8.9–10.3)
Calcium: 8.7 mg/dL — ABNORMAL LOW (ref 8.9–10.3)
Chloride: 97 mmol/L — ABNORMAL LOW (ref 98–111)
Chloride: 98 mmol/L (ref 98–111)
Creatinine, Ser: 1.05 mg/dL — ABNORMAL HIGH (ref 0.44–1.00)
Creatinine, Ser: 1.23 mg/dL — ABNORMAL HIGH (ref 0.44–1.00)
GFR, Estimated: 51 mL/min — ABNORMAL LOW (ref >60)
GFR, Estimated: 42 mL/min — ABNORMAL LOW (ref >60)
Glucose, Bld: 142 mg/dL — ABNORMAL HIGH (ref 70–99)
Glucose, Bld: 150 mg/dL — ABNORMAL HIGH (ref 70–99)
Potassium: 2.7 mmol/L — CL (ref 3.5–5.1)
Potassium: 4.6 mmol/L (ref 3.5–5.1)
Sodium: 136 mmol/L (ref 135–145)
Sodium: 138 mmol/L (ref 135–145)

## 2021-05-02 LAB — PHOSPHORUS: Phosphorus: 3.1 mg/dL (ref 2.5–4.6)

## 2021-05-02 LAB — URINE CULTURE: Culture: >=100000 — AB

## 2021-05-02 LAB — MAGNESIUM: Magnesium: 2.1 mg/dL (ref 1.7–2.4)

## 2021-05-02 MED ORDER — POTASSIUM CHLORIDE CRYS ER 20 MEQ PO TBCR
40.0000 meq | EXTENDED_RELEASE_TABLET | ORAL | Status: AC
Start: 2021-05-02 — End: 2021-05-02
  Administered 2021-05-02 (×2): 40 meq via ORAL
  Filled 2021-05-02 (×2): qty 2

## 2021-05-02 NOTE — Progress Notes (Signed)
Patient has decreased appetite and eating very little.Noted difficulty chewing food. Changed diet to mechanical soft.

## 2021-05-02 NOTE — Progress Notes (Signed)
Rec'd call from lab re: critical lab value of 2.7, messaged on call provider and rec'd order for Potassium Chloride(Klor-Con)Cr tablet 84meq po q4 hrs. Will initiate medication as ordered and continue to monitor lab values and tx as indicated.

## 2021-05-02 NOTE — Progress Notes (Signed)
PROGRESS NOTE  [name] JME:[id number] DOB: 07/25/1929 DOA: 04/29/2021 PCP: Garwin Brothers, MD  HPI/Recap of past 30 hours: 87 year old female with a history of chronic atrial fibrillation on Eliquis, dementia with behavioral disturbance, hypertension presents to the ER from the nursing home due to repeated falls on the day of presentation.  Patient has a history of A. fib is on Eliquis.  Patient evaluated in the ER as a trauma patient.  Trauma scans were all negative.  Patient noted to have atrial fibrillation.  CT scan chest performed due to persistent hypoxia demonstrated bilateral pleural effusions right greater than left that have significantly increased in size since her scan in September 2022.   -Underwent thoracentesis in IR on 10/14 Patient recently seen by palliative care as an outpatient.  Son was hopeful that the patient could go to [facility] under hospice care.     Assessment/Plan:  Acute hypoxic respiratory failure secondary to bilateral pleural effusions, right greater than left, Acute on chronic systolic CHF, EF 22% with RV dysfunction Pulmonary hypertension, moderate MR, moderate to severe TR Post thoracentesis on 04/30/2021 by interventional radiologist, 600 cc of hazy yellow fluid was removed. 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 -Dr. Bridgett Larsson discontinued Eliquis in the setting of multiple falls,  agree with this   Benign essential HTN BPs are soft, reduce IV Lasix 20 mg to daily from twice daily Continue home Cardizem and Lopressor.   Ambulatory dysfunction with multiple falls.  No fractures seen on xrays and CT  Pt with repeated falls at [facility].  -Previous provider discussed anticoagulation with patient's son in setting of frequent falls and Eliquis was discontinued     DVT prophylaxis: SCDs Code Status: DNR/DNI(Do NOT Intubate)   Family Communication: None at bedside.  Disposition Plan: SNF vs hospice house  Consults called: none  Admission status: Inpatient, Telemetry bed   Objective: Vitals:   05/01/21 1223 05/01/21 2349 05/02/21 0500 05/02/21 0811  BP: 95/60 101/61  132/77  Pulse: 98 71  100  Resp: 17     Temp: 97.8 F (36.6 C) 98.1 F (36.7 C)  97.6 F (36.4 C)  TempSrc: Oral Oral  Oral  SpO2: 97%   99%  Weight:   45.1 kg   Height:        Intake/Output Summary (Last 24 hours) at 05/02/2021 1031 Last data filed at 05/01/2021 1600 Gross per 24 hour  Intake 120 ml  Output 600 ml  Net -480 ml   Filed Weights   04/29/21 1651 05/01/21 0500 05/02/21 0500  Weight: 45.4 kg 44.5 kg 45.1 kg    Exam:  General: 87 y.o. year-old female chronically ill elderly female, laying in bed, awake alert, easily arousable, oriented to self and place only, cognitive deficits noted CVS: S1-S2, regular rate rhythm Lungs: Rare basilar rales, decreased breath sounds Abdomen: Soft, nontender, bowel sounds present Extremities: No edema  Skin: No rashes on exposed skin Psychiatry: Poor insight and judgment  Data Reviewed: CBC: Recent Labs  Lab 04/29/21 1754 04/30/21 0600 05/02/21 0251  WBC 12.5* 10.0 10.4  NEUTROABS 10.8* 8.0*  --   HGB 12.5 10.5* 12.1  HCT 41.2 33.2* 37.3  MCV 102.0* 99.4 96.6  PLT 328 280 161   Basic Metabolic Panel: Recent Labs  Lab 04/29/21 1754 04/30/21 0600 05/02/21 0251  NA 139 140 136  K 3.4* 3.8 2.7*  CL 103 106 97*  CO2 27 26 28    GLUCOSE 130* 122* 142*  BUN 20 16 23   CREATININE 1.03* 0.88 1.05*  CALCIUM 9.0 8.6* 8.3*  MG  --  1.9 2.1  PHOS  --   --  3.1   GFR: Estimated Creatinine Clearance: 25.9 mL/min (A) (by C-G formula based on SCr of 1.05 mg/dL (H)). Liver Function Tests: Recent Labs  Lab 04/30/21 0600  AST 16  ALT 19  ALKPHOS 121  BILITOT 1.0  PROT 5.4*  ALBUMIN 2.1*   No results for input(s): LIPASE, AMYLASE in the last 168 hours. No results for input(s): AMMONIA in the last 168 hours. Coagulation Profile: No results for input(s): INR, PROTIME in the last 168 hours. Cardiac Enzymes: No results for input(s): CKTOTAL, CKMB, CKMBINDEX, TROPONINI in the last 168 hours. BNP (last 3 results) No results for input(s): PROBNP in the last 8760 hours. HbA1C: No results for input(s): HGBA1C in the last 72 hours. CBG: No results for input(s): GLUCAP in the last 168 hours. Lipid Profile: No results for input(s): CHOL, HDL, LDLCALC, TRIG, CHOLHDL, LDLDIRECT in the last 72 hours. Thyroid Function Tests: No results for input(s): TSH, T4TOTAL, FREET4, T3FREE, THYROIDAB in the last 72 hours. Anemia Panel: No results for input(s): VITAMINB12, FOLATE, FERRITIN, TIBC, IRON, RETICCTPCT in the last 72 hours. Urine analysis:    Component Value Date/Time   COLORURINE YELLOW 04/29/2021 2234   APPEARANCEUR CLOUDY (A) 04/29/2021 2234   LABSPEC 1.020 04/29/2021 2234   PHURINE 7.0 04/29/2021 2234   GLUCOSEU NEGATIVE 04/29/2021 2234   HGBUR MODERATE (A) 04/29/2021 2234   BILIRUBINUR NEGATIVE 04/29/2021 [place] 04/29/2021 2234   PROTEINUR 30 (A) 04/29/2021 2234   NITRITE NEGATIVE 04/29/2021 2234   LEUKOCYTESUR LARGE (A) 04/29/2021 2234   Sepsis Labs: @LABRCNTIP (procalcitonin:4,lacticidven:4)  ) Recent Results (from the past 240 hour(s))  Urine Culture     Status: Abnormal (Preliminary result)   Collection Time: 04/29/21  5:05 PM   Specimen: Urine, Clean Catch  Result Value Ref Range  Status   Specimen Description URINE, CLEAN CATCH  Final   Special Requests NONE  Final   Culture (A)  Final    >=100,000 COLONIES/mL ENTEROCOCCUS FAECALIS SUSCEPTIBILITIES TO FOLLOW Performed at [facility], [place] [address] [place], [place] [address]    Report Status PENDING  Incomplete  Resp Panel by RT-PCR (Flu A&B, Covid) Nasopharyngeal Swab     Status: None   Collection Time: 04/29/21  7:08 PM   Specimen: Nasopharyngeal Swab; Nasopharyngeal(NP) swabs in vial transport medium  Result Value Ref Range Status   SARS Coronavirus 2 by RT PCR NEGATIVE NEGATIVE Final    Comment: (NOTE) SARS-CoV-2 target nucleic acids are NOT DETECTED.  The SARS-CoV-2 RNA is generally detectable in upper respiratory specimens during the acute phase of infection. The lowest concentration of SARS-CoV-2 viral copies this assay can detect is 138 copies/mL. A negative result does not preclude SARS-Cov-2 infection and should not be used as the sole basis for treatment or other patient management decisions. A negative result may occur with  improper specimen  collection/handling, submission of specimen other than nasopharyngeal swab, presence of viral mutation(s) within the areas targeted by this assay, and inadequate number of viral copies(<138 copies/mL). A negative result must be combined with clinical observations, patient history, and epidemiological information. The expected result is Negative.  Fact Sheet for Patients:  EntrepreneurPulse.com.au  Fact Sheet for Healthcare Providers:  IncredibleEmployment.be  This test is no t yet approved or cleared by the [place] FDA and  has been authorized for detection and/or diagnosis of SARS-CoV-2 by FDA under an Emergency Use Authorization (EUA). This EUA will remain  in effect (meaning this test can be used) for the duration of the COVID-19 declaration under Section 564(b)(1) of the Act, 21 U.S.C.section  360bbb-3(b)(1), unless the authorization is terminated  or revoked sooner.       Influenza A by PCR NEGATIVE NEGATIVE Final   Influenza B by PCR NEGATIVE NEGATIVE Final    Comment: (NOTE) The Xpert Xpress SARS-CoV-2/FLU/RSV plus assay is intended as an aid in the diagnosis of influenza from Nasopharyngeal swab specimens and should not be used as a sole basis for treatment. Nasal washings and aspirates are unacceptable for Xpert Xpress SARS-CoV-2/FLU/RSV testing.  Fact Sheet for Patients: EntrepreneurPulse.com.au  Fact Sheet for Healthcare Providers: IncredibleEmployment.be  This test is not yet approved or cleared by the [place] FDA and has been authorized for detection and/or diagnosis of SARS-CoV-2 by FDA under an Emergency Use Authorization (EUA). This EUA will remain in effect (meaning this test can be used) for the duration of the COVID-19 declaration under Section 564(b)(1) of the Act, 21 U.S.C. section 360bbb-3(b)(1), unless the authorization is terminated or revoked.  Performed at [facility], [place] [address], [place], [place] [address]   Body fluid culture w Gram Stain     Status: None (Preliminary result)   Collection Time: 04/30/21 11:40 AM   Specimen: Lung, Right; Pleural Fluid  Result Value Ref Range Status   Specimen Description PLEURAL FLUID  Final   Special Requests LUNG RIGHT  Final   Gram Stain   Final    WBC PRESENT,BOTH PMN AND MONONUCLEAR NO ORGANISMS SEEN CYTOSPIN SMEAR    Culture   Final    NO GROWTH < 24 HOURS Performed at [facility], [place] [address], [place], [place] [address]    Report Status PENDING  Incomplete      Studies: No results found.  Scheduled Meds:  diltiazem  120 mg Oral Daily   furosemide  20 mg Intravenous Daily   magnesium oxide  400 mg Oral Daily   metoprolol succinate  50 mg Oral Daily    Continuous Infusions:  sodium chloride 250 mL (04/29/21 2317)      LOS: 3 days     Domenic Polite, MD [facility]  05/02/2021, 10:31 AM

## 2021-05-03 DIAGNOSIS — R627 Adult failure to thrive: Secondary | ICD-10-CM

## 2021-05-03 LAB — BASIC METABOLIC PANEL
Anion gap: 8 (ref 5–15)
BUN: 27 mg/dL — ABNORMAL HIGH (ref 8–23)
CO2: 30 mmol/L (ref 22–32)
Calcium: 8.8 mg/dL — ABNORMAL LOW (ref 8.9–10.3)
Chloride: 98 mmol/L (ref 98–111)
Creatinine, Ser: 0.99 mg/dL (ref 0.44–1.00)
GFR, Estimated: 55 mL/min — ABNORMAL LOW (ref >60)
Glucose, Bld: 169 mg/dL — ABNORMAL HIGH (ref 70–99)
Potassium: 4.2 mmol/L (ref 3.5–5.1)
Sodium: 136 mmol/L (ref 135–145)

## 2021-05-03 LAB — CYTOLOGY - NON PAP

## 2021-05-03 LAB — BODY FLUID CULTURE W GRAM STAIN: Culture: NO GROWTH

## 2021-05-03 MED ORDER — DILTIAZEM HCL ER COATED BEADS 180 MG PO CP24: 180.0000 mg | ORAL_CAPSULE | Freq: Every day | ORAL | Status: DC

## 2021-05-03 MED ORDER — FUROSEMIDE 20 MG PO TABS: 20.0000 mg | ORAL_TABLET | Freq: Every day | ORAL | Status: DC

## 2021-05-03 MED ORDER — DILTIAZEM HCL 60 MG PO TABS
60.0000 mg | ORAL_TABLET | Freq: Once | ORAL | Status: AC
  Administered 2021-05-03: 60 mg via ORAL
  Filled 2021-05-03: qty 1

## 2021-05-03 MED ORDER — MORPHINE SULFATE (CONCENTRATE) 10 MG/0.5ML PO SOLN: 5.0000 mg | ORAL | Status: DC | PRN

## 2021-05-03 NOTE — Consult Note (Signed)
Consultation Note Date: 05/03/2021   Patient Name: [name]  DOB: 1930-06-13  MRN: [id number]  Age / Sex: 87 y.o., female  PCP: Garwin Brothers, MD Referring Physician: Domenic Polite, MD  Reason for Consultation: Establishing goals of care and Psychosocial/spiritual support  HPI/Patient Profile: 87 y.o. female  admitted on 04/29/2021 with past medical history of chronic atrial fibrillation,  dementia with behavioral disturbance, hypertension presents to the ER from the nursing home due to repeated falls on the day of presentation.    Family report continued physical, functional and cognitive decline over the past many months.   Patient evaluated in the ER as a trauma patient.  Trauma scans were all negative.  Patient noted to have atrial fibrillation.  CT scan chest performed due to persistent hypoxia demonstrated bilateral pleural effusions right greater than left that have significantly increased in size since her scan in September 2022.    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  Currently patient confused, unable to follow commands with very poor p.o. intake.  She does not have capacity to make her own decisions.  Family face treatment option decisions, advanced directive decisions and anticipatory care needs.   Clinical Assessment and Goals of Care:  This NP Wadie Lessen reviewed medical records, received report from team, assessed the patient and then meet with the patient's son Cassell [facility] (currently in the emergency room and waiting admission) and daughter Jearld Adjutant to discuss diagnosis, prognosis, [place], EOL wishes disposition and options.   Concept of Palliative Care was introduced as specialized medical care for people and their families living with serious illness.  If focuses on providing  relief from the symptoms and stress of a serious illness.  The goal is to improve quality of life for both the patient and the family.  Values and goals of care important to patient and family were attempted to be elicited.    A  discussion was had today regarding advanced directives.  Concepts specific to code status, artifical feeding and hydration, continued IV antibiotics and rehospitalization was had.  The difference between a aggressive medical intervention path  and a palliative comfort care path for this patient at this time was had.     Created space and opportunity for family to explore thoughts and feelings regarding current medical situation.  Both son and daughter verbalized an understanding of their mother's current medical situation and continued decline/failing to thrive.  Education offered on the natural trajectory of dementia.  Both son and daughter agreed that focus of care should focus solely on comfort and dignity foregoing life-prolonging measures allowing for natural death.  MOST form completed   Natural trajectory and expectations at EOL were discussed.  Questions and concerns addressed.  Patient  encouraged to call with questions or concerns.     PMT will continue to support holistically.          HPOA/son/[name] Neidhardt      SUMMARY OF RECOMMENDATIONS  Code Status/Advance Care Planning: DNR Focus of care is comfort and dignity, allowing for natural death   Symptom Management:  Pain/Dyspnea: Roxanol 5 mg p.o./sublingual every 2 hours as needed for pain or shortness of breath. Anxiety/agitation: Ativan 1 mg po/sl every 4 hrs as needed  Palliative Prophylaxis:  Frequent Pain Assessment, Oral Care, and Turn Reposition  Additional Recommendations (Limitations, Scope, Preferences): Full Comfort Care  Psycho-social/Spiritual:  Desire for further Chaplaincy support:yes Additional Recommendations: Education on Hospice    Discharge Planning: To Be  Determined      Primary Diagnoses: Present on Admission: **None**   I have reviewed the medical record, interviewed the patient and family, and examined the patient. The following aspects are pertinent.  No past medical history on file. Social History   Socioeconomic History   Marital status: Widowed    Spouse name: Not on file   Number of children: Not on file   Years of education: Not on file   Highest education level: Not on file  Occupational History   Not on file  Tobacco Use   Smoking status: Not on file   Smokeless tobacco: Not on file  Substance and Sexual Activity   Alcohol use: Not on file   Drug use: Not on file   Sexual activity: Not on file  Other Topics Concern   Not on file  Social History Narrative   Not on file   Social Determinants of Health   Financial Resource Strain: Not on file  Food Insecurity: Not on file  Transportation Needs: Not on file  Physical Activity: Not on file  Stress: Not on file  Social Connections: Not on file   No family history on file. Scheduled Meds:  diltiazem  120 mg Oral Daily   furosemide  20 mg Intravenous Daily   magnesium oxide  400 mg Oral Daily   metoprolol succinate  50 mg Oral Daily   Continuous Infusions:  sodium chloride 250 mL (04/29/21 2317)   PRN Meds:.sodium chloride, acetaminophen **OR** acetaminophen, albuterol, haloperidol lactate, lidocaine (PF), metoprolol tartrate, ondansetron **OR** ondansetron (ZOFRAN) IV Medications Prior to Admission:  Prior to Admission medications   Medication Sig Start Date End Date Taking? Authorizing Provider  calcium carbonate (OSCAL) 1500 (600 Ca) MG TABS tablet Take 600 mg of elemental calcium by mouth 2 (two) times daily with a meal.   Yes [provider]  Cholecalciferol (D3-1000) 25 MCG (1000 UT) tablet Take 2,000 Units by mouth daily.   Yes [provider]  diltiazem (CARDIZEM CD) 120 MG 24 hr capsule Take 120 mg by mouth daily.   Yes [provider]  ELIQUIS 2.5 MG TABS tablet Take 2.5 mg by mouth 2 (two) times daily. 03/03/21  Yes [provider]  HYDROcodone-acetaminophen (NORCO/VICODIN) 5-325 MG tablet Take 1 tablet by mouth every 12 (twelve) hours as needed for pain. 03/18/21  Yes [provider]  magnesium oxide (MAG-OX) 400 MG tablet Take 400 mg by mouth daily.   Yes [provider]  metoprolol succinate (TOPROL-XL) 50 MG 24 hr tablet Take 50 mg by mouth daily. 04/16/21  Yes [provider]  NONFORMULARY OR COMPOUNDED ITEM Take 120 mLs by mouth 3 (three) times daily after meals. House supplement for malnutrition   Yes [provider]  Omega-3 Fatty Acids (FISH OIL) 1000 MG CAPS Take 1,000 mg by mouth daily.   Yes [provider]  polyethylene glycol (MIRALAX / GLYCOLAX) 17 g packet Take 17 g by mouth 2 (two)  times daily.   Yes [provider]  senna (SENOKOT) 8.6 MG TABS tablet Take 1 tablet by mouth daily.   Yes [provider]  sertraline (ZOLOFT) 50 MG tablet Take 50 mg by mouth daily. 04/16/21  Yes [provider]   No Known Allergies Review of Systems  Unable to perform ROS: Mental status change   Physical Exam Constitutional:      Appearance: She is underweight. She is ill-appearing.  Cardiovascular:     Rate and Rhythm: Tachycardia present.  Pulmonary:     Effort: Pulmonary effort is normal.  Musculoskeletal:     Comments: Generalized weakness and muscle atrophy  Neurological:     Mental Status: She is lethargic.    Vital Signs: BP 100/70 (BP Location: Right Arm)   Pulse 100   Temp 98.8 F (37.1 C) (Oral)   Resp 18   Ht 5\' 5"  (1.651 m)   Wt 45.3 kg   SpO2 99%   BMI 16.62 kg/m  Pain Scale: PAINAD   Pain Score: 0-No pain   SpO2: SpO2: 99 % O2 Device:SpO2: 99 % O2 Flow Rate: .O2 Flow Rate (L/min): 4 L/min  IO: Intake/output summary:  Intake/Output Summary (Last 24 hours) at 05/03/2021 1015 Last data filed at  05/02/2021 1800 Gross per 24 hour  Intake 170 ml  Output 450 ml  Net -280 ml    LBM:   Baseline Weight: Weight: 45.4 kg Most recent weight: Weight: 45.3 kg     Palliative Assessment/Data: 30 % at best     Discussed with Dr. Broadus John and bedside RN  Time In: 1215 Time Out: 1330 Time Total: 75 minutes Greater than 50%  of this time was spent counseling and coordinating care related to the above assessment and plan.  Signed by: Wadie Lessen, NP   Please contact Palliative Medicine Team phone at [phone] for questions and concerns.  For individual provider: See Shea Evans

## 2021-05-03 NOTE — Progress Notes (Signed)
PROGRESS NOTE  [name] GLO:[id number] DOB: Nov 01, 1929 DOA: 04/29/2021 PCP: Garwin Brothers, MD  HPI/Recap of past 88 hours: 87 year old female with a history of chronic atrial fibrillation on Eliquis, dementia with behavioral disturbance, hypertension presents to the ER from the nursing home due to repeated falls on the day of presentation.  Patient has a history of A. fib is on Eliquis.  Patient evaluated in the ER as a trauma patient.  Trauma scans were all negative.  Patient noted to have atrial fibrillation.  CT scan chest performed due to persistent hypoxia demonstrated bilateral pleural effusions right greater than left that have significantly increased in size since her scan in September 2022.   -Underwent thoracentesis in IR on 10/14: 600 mL drained, 64ml drained Patient recently seen by palliative care as an outpatient.  Son was hopeful that the patient could go to [facility] under hospice care.     Assessment/Plan:  Acute hypoxic respiratory failure secondary to bilateral pleural effusions, right greater than left, Acute on chronic systolic CHF, EF 18% with RV dysfunction Pulmonary hypertension, moderate MR, moderate to severe TR Post thoracentesis on 04/30/2021 by interventional radiologist, 600 cc of hazy yellow fluid was removed. 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 Bridgett Larsson discontinued Eliquis in the setting of multiple falls, agree with this   Benign essential HTN -Stable, as above   Ambulatory dysfunction with multiple falls.  No fractures seen on xrays and CT  Pt with repeated falls at [facility].  -Previous provider discussed anticoagulation with patient's son in setting of frequent falls and Eliquis was discontinued     DVT prophylaxis: SCDs Code Status: DNR/DNI  Family Communication: None at bedside, was unable to reach son yesterday, will attempt later today  Disposition Plan: SNF vs hospice house  Consults called: Palliative medicine, IR Admission status: Inpatient, Telemetry bed  Procedure: thoracentesis in IR on 10/14: 600 mL drained, 680ml drained   Objective: Vitals:   05/03/21 0356 05/03/21 0805 05/03/21 0821 05/03/21 0840  BP:      Pulse:   (!) 117 100  Resp:  (!) 25 18   Temp:      TempSrc:      SpO2:      Weight: 45.3 kg     Height:        Intake/Output Summary (Last 24 hours) at 05/03/2021 1020 Last data filed at 05/02/2021 1800 Gross per 24 hour  Intake 170 ml  Output 450 ml  Net -280 ml   Filed Weights   05/01/21 0500 05/02/21 0500 05/03/21 0356  Weight: 44.5 kg 45.1 kg 45.3 kg    Exam:  chronically ill elderly female, laying in bed, awake alert, easily arousable, oriented to self and place only, cognitive deficits noted CVS: S1-S2, regular rate rhythm Lungs: Rare basilar rales, decreased breath sounds Abdomen: Soft, nontender, bowel sounds present Extremities: No edema  Skin: No rashes on exposed skin Psychiatry: Poor insight and judgment  Data Reviewed: CBC: Recent Labs  Lab 04/29/21 1754 04/30/21 0600 05/02/21 0251  WBC 12.5* 10.0 10.4  NEUTROABS 10.8* 8.0*  --   HGB 12.5 10.5* 12.1  HCT 41.2 33.2* 37.3  MCV 102.0* 99.4 96.6  PLT 328 280 597   Basic Metabolic Panel: Recent Labs  Lab 04/29/21 1754 04/30/21 0600 05/02/21 0251 05/02/21 1433 05/03/21 0238  NA 139 140  136 138 136  K 3.4* 3.8 2.7* 4.6 4.2  CL 103 106 97* 98 98  CO2 27 26 28 23 30   GLUCOSE 130* 122* 142* 150* 169*  BUN 20 16 23  30* 27*  CREATININE 1.03* 0.88 1.05* 1.23* 0.99  CALCIUM 9.0 8.6* 8.3* 8.7* 8.8*  MG  --  1.9 2.1  --   --   PHOS  --   --  3.1  --   --    GFR: Estimated Creatinine Clearance: 27.5 mL/min (by C-G formula based on SCr of 0.99 mg/dL). Liver Function Tests: Recent Labs  Lab 04/30/21 0600  AST 16  ALT 19  ALKPHOS 121  BILITOT 1.0  PROT 5.4*  ALBUMIN 2.1*   No results for input(s): LIPASE, AMYLASE in the last 168 hours. No results for input(s): AMMONIA in the last 168 hours. Coagulation Profile: No results for input(s): INR, PROTIME in the last 168 hours. Cardiac Enzymes: No results for input(s): CKTOTAL, CKMB, CKMBINDEX, TROPONINI in the last 168 hours. BNP (last 3 results) No results for input(s): PROBNP in the last 8760 hours. HbA1C: No results for input(s): HGBA1C in the last 72 hours. CBG: No results for input(s): GLUCAP in the last 168 hours. Lipid Profile: No results for input(s): CHOL, HDL, LDLCALC, TRIG, CHOLHDL, LDLDIRECT in the last 72 hours. Thyroid Function Tests: No results for input(s): TSH, T4TOTAL, FREET4, T3FREE, THYROIDAB in the last 72 hours. Anemia Panel: No results for input(s): VITAMINB12, FOLATE, FERRITIN, TIBC, IRON, RETICCTPCT in the last 72 hours. Urine analysis:    Component Value Date/Time   COLORURINE YELLOW 04/29/2021 2234   APPEARANCEUR CLOUDY (A) 04/29/2021 2234   LABSPEC 1.020 04/29/2021 2234   PHURINE 7.0 04/29/2021 2234   GLUCOSEU NEGATIVE 04/29/2021 2234   HGBUR MODERATE (A) 04/29/2021 2234   BILIRUBINUR NEGATIVE 04/29/2021 [place] 04/29/2021 2234   PROTEINUR 30 (A) 04/29/2021 2234   NITRITE NEGATIVE 04/29/2021 2234   LEUKOCYTESUR LARGE (A) 04/29/2021 2234   Sepsis Labs: @LABRCNTIP (procalcitonin:4,lacticidven:4)  ) Recent Results (from the past 240 hour(s))  Urine Culture      Status: Abnormal   Collection Time: 04/29/21  5:05 PM   Specimen: Urine, Clean Catch  Result Value Ref Range Status   Specimen Description URINE, CLEAN CATCH  Final   Special Requests   Final    NONE Performed at [facility], [place] [address], [place], [place] [address]    Culture >=100,000 COLONIES/mL ENTEROCOCCUS FAECALIS (A)  Final   Report Status 05/02/2021 FINAL  Final   Organism ID, Bacteria ENTEROCOCCUS FAECALIS (A)  Final      Susceptibility   Enterococcus faecalis - MIC*    AMPICILLIN <=2 SENSITIVE Sensitive     NITROFURANTOIN <=16 SENSITIVE Sensitive     VANCOMYCIN 1 SENSITIVE Sensitive     * >=100,000 COLONIES/mL ENTEROCOCCUS FAECALIS  Resp Panel by RT-PCR (Flu A&B, Covid) Nasopharyngeal Swab     Status: None   Collection Time: 04/29/21  7:08 PM   Specimen: Nasopharyngeal Swab; Nasopharyngeal(NP) swabs in vial transport medium  Result Value Ref Range Status  SARS Coronavirus 2 by RT PCR NEGATIVE NEGATIVE Final    Comment: (NOTE) SARS-CoV-2 target nucleic acids are NOT DETECTED.  The SARS-CoV-2 RNA is generally detectable in upper respiratory specimens during the acute phase of infection. The lowest concentration of SARS-CoV-2 viral copies this assay can detect is 138 copies/mL. A negative result does not preclude SARS-Cov-2 infection and should not be used as the sole basis for treatment or other patient management decisions. A negative result may occur with  improper specimen collection/handling, submission of specimen other than nasopharyngeal swab, presence of viral mutation(s) within the areas targeted by this assay, and inadequate number of viral copies(<138 copies/mL). A negative result must be combined with clinical observations, patient history, and epidemiological information. The expected result is Negative.  Fact Sheet for Patients:  EntrepreneurPulse.com.au  Fact Sheet for Healthcare Providers:   IncredibleEmployment.be  This test is no t yet approved or cleared by the [place] FDA and  has been authorized for detection and/or diagnosis of SARS-CoV-2 by FDA under an Emergency Use Authorization (EUA). This EUA will remain  in effect (meaning this test can be used) for the duration of the COVID-19 declaration under Section 564(b)(1) of the Act, 21 U.S.C.section 360bbb-3(b)(1), unless the authorization is terminated  or revoked sooner.       Influenza A by PCR NEGATIVE NEGATIVE Final   Influenza B by PCR NEGATIVE NEGATIVE Final    Comment: (NOTE) The Xpert Xpress SARS-CoV-2/FLU/RSV plus assay is intended as an aid in the diagnosis of influenza from Nasopharyngeal swab specimens and should not be used as a sole basis for treatment. Nasal washings and aspirates are unacceptable for Xpert Xpress SARS-CoV-2/FLU/RSV testing.  Fact Sheet for Patients: EntrepreneurPulse.com.au  Fact Sheet for Healthcare Providers: IncredibleEmployment.be  This test is not yet approved or cleared by the [place] FDA and has been authorized for detection and/or diagnosis of SARS-CoV-2 by FDA under an Emergency Use Authorization (EUA). This EUA will remain in effect (meaning this test can be used) for the duration of the COVID-19 declaration under Section 564(b)(1) of the Act, 21 U.S.C. section 360bbb-3(b)(1), unless the authorization is terminated or revoked.  Performed at [facility], [place] [address], [place], [place] [address]   Body fluid culture w Gram Stain     Status: None (Preliminary result)   Collection Time: 04/30/21 11:40 AM   Specimen: Lung, Right; Pleural Fluid  Result Value Ref Range Status   Specimen Description PLEURAL FLUID  Final   Special Requests LUNG RIGHT  Final   Gram Stain   Final    WBC PRESENT,BOTH PMN AND MONONUCLEAR NO ORGANISMS SEEN CYTOSPIN SMEAR    Culture   Final    NO GROWTH 2  DAYS Performed at [facility], [address], [place], [place] [address]    Report Status PENDING  Incomplete      Studies: No results found.  Scheduled Meds:  diltiazem  120 mg Oral Daily   furosemide  20 mg Intravenous Daily   magnesium oxide  400 mg Oral Daily   metoprolol succinate  50 mg Oral Daily    Continuous Infusions:  sodium chloride 250 mL (04/29/21 2317)     LOS: 4 days    Domenic Polite, MD [facility]  05/03/2021, 10:20 AM

## 2021-05-04 DIAGNOSIS — R52 Pain, unspecified: Secondary | ICD-10-CM

## 2021-05-04 DIAGNOSIS — Z515 Encounter for palliative care: Secondary | ICD-10-CM

## 2021-05-04 MED ORDER — MORPHINE SULFATE (CONCENTRATE) 10 MG/0.5ML PO SOLN
5.0000 mg | Freq: Three times a day (TID) | ORAL | Status: DC
Start: 2021-05-04 — End: 2021-05-07
  Administered 2021-05-04 – 2021-05-07 (×9): 5 mg via ORAL
  Filled 2021-05-04 (×9): qty 0.5

## 2021-05-04 MED ORDER — LORAZEPAM 1 MG PO TABS: 1.0000 mg | ORAL_TABLET | Freq: Four times a day (QID) | ORAL | Status: DC | PRN

## 2021-05-04 MED ORDER — MORPHINE SULFATE (CONCENTRATE) 10 MG/0.5ML PO SOLN: 5.0000 mg | ORAL | Status: DC | PRN

## 2021-05-04 NOTE — Progress Notes (Signed)
This chaplain responded to PMT consult for spiritual care requested by the Pt. son.    The chaplain arrived with the Pt. meal. The Pt. is awake at the time of the chaplain's bedside presence and responds to the chaplain's conversation. The chaplain notified the unit secretary of the tray arrival and asked for RN assistance with the Pt. clothes.   The chaplain covered the Pt. up and listened as the Pt. described the trees in her back yard.  As we move the conversation to family, the chaplain learned the Pt. has two children: Harrie Jeans and [place].   The time with the Pt. ended as shared "be careful" and  fell asleep.  The chaplain is available for F/U spiritual care as needed.  Chaplain Sallyanne Kuster [phone]

## 2021-05-04 NOTE — Progress Notes (Signed)
PROGRESS NOTE  Arlicia Paquette FWY:[id number] DOB: [birth year] DOA: 04/29/2021 PCP: Garwin Brothers, MD  HPI/Recap of past 24 hours: [name] with a history of chronic atrial fibrillation on Eliquis, dementia with behavioral disturbance, hypertension presents to the ER from the nursing home due to repeated falls.  Patient has a history of A. fib is on Eliquis.   Trauma scans were all negative. CT scan chest performed due to persistent hypoxia demonstrated bilateral pleural effusions right greater than left that have significantly increased in size since her scan in September 2022.   -Underwent thoracentesis in IR on 10/14: 600 mL drained, 69ml drained Patient recently seen by palliative care as an outpatient.  Son was hopeful that the patient could go to [facility] under hospice care.   -Seen by palliative care, now comfort measures   Assessment/Plan:  Acute hypoxic respiratory failure secondary to bilateral pleural effusions, right greater than left, Acute on chronic systolic CHF, EF 27% with RV dysfunction Pulmonary hypertension, moderate MR, moderate to severe TR Post thoracentesis on 04/30/2021 by interventional radiologist, 600 cc of hazy yellow fluid was removed. 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      DVT prophylaxis: SCDs Code Status: DNR/DNI Family Communication: No family at bedside, was unable to reach son yesterday who is currently  hospitalized  Disposition Plan: To be determined,?  Residential hospice versus SNF w/ hospice Consults called: Palliative medicine, IR   Procedure: thoracentesis in IR on 10/14: 600 mL drained, 64ml drained   Objective: Vitals:   05/03/21 1102 05/03/21 1356 05/03/21 2000 05/04/21 0500  BP: 99/60 (!) 102/54 128/73 (!) 147/88  Pulse:  88 94 80  Resp:  16 18 18   Temp:  98.9 F (37.2 C) 97.9 F (36.6 C) 98 F (36.7 C)  TempSrc:  Oral Axillary Oral  SpO2:   90% 100%  Weight:      Height:        Intake/Output Summary (Last 24 hours) at 05/04/2021 1510 Last data filed at 05/04/2021 0500 Gross per 24 hour  Intake --  Output 250 ml  Net -250 ml   Filed Weights   05/01/21 0500 05/02/21 0500 05/03/21 0356  Weight: 44.5 kg 45.1 kg 45.3 kg    Exam:  Chronically ill elderly female laying in bed, somnolent, easily arousable, somewhat confused CVS: S1-S2, irregular rhythm, tachycardic Lungs: Decreased breath sounds to bases Abdomen: Soft, nontender, bowel sounds present Extremities: No edema  Skin: No rashes on exposed skin Psychiatry: Poor insight and judgment  Data Reviewed: CBC: Recent Labs  Lab 04/29/21 1754 04/30/21 0600 05/02/21 0251  WBC 12.5* 10.0 10.4  NEUTROABS 10.8* 8.0*  --   HGB 12.5 10.5* 12.1  HCT 41.2 33.2* 37.3  MCV 102.0* 99.4 96.6  PLT 328 280 741   Basic Metabolic Panel: Recent Labs  Lab 04/29/21 1754 04/30/21 0600 05/02/21 0251 05/02/21 1433 05/03/21 0238  NA 139 140 136 138 136  K 3.4* 3.8 2.7* 4.6 4.2  CL 103 106 97* 98 98  CO2 27  26 28 23 30   GLUCOSE 130* 122* 142* 150* 169*  BUN 20 16 23  30* 27*  CREATININE 1.03* 0.88 1.05* 1.23* 0.99  CALCIUM 9.0 8.6* 8.3* 8.7* 8.8*  MG  --  1.9 2.1  --   --   PHOS  --   --  3.1  --   --    GFR: Estimated Creatinine Clearance: 27.5 mL/min (by C-G formula based on SCr of 0.99 mg/dL). Liver Function Tests: Recent Labs  Lab 04/30/21 0600  AST 16  ALT 19  ALKPHOS 121  BILITOT 1.0  PROT  5.4*  ALBUMIN 2.1*   No results for input(s): LIPASE, AMYLASE in the last 168 hours. No results for input(s): AMMONIA in the last 168 hours. Coagulation Profile: No results for input(s): INR, PROTIME in the last 168 hours. Cardiac Enzymes: No results for input(s): CKTOTAL, CKMB, CKMBINDEX, TROPONINI in the last 168 hours. BNP (last 3 results) No results for input(s): PROBNP in the last 8760 hours. HbA1C: No results for input(s): HGBA1C in the last 72 hours. CBG: No results for input(s): GLUCAP in the last 168 hours. Lipid Profile: No results for input(s): CHOL, HDL, LDLCALC, TRIG, CHOLHDL, LDLDIRECT in the last 72 hours. Thyroid Function Tests: No results for input(s): TSH, T4TOTAL, FREET4, T3FREE, THYROIDAB in the last 72 hours. Anemia Panel: No results for input(s): VITAMINB12, FOLATE, FERRITIN, TIBC, IRON, RETICCTPCT in the last 72 hours. Urine analysis:    Component Value Date/Time   COLORURINE YELLOW 04/29/2021 2234   APPEARANCEUR CLOUDY (A) 04/29/2021 2234   LABSPEC 1.020 04/29/2021 2234   PHURINE 7.0 04/29/2021 2234   GLUCOSEU NEGATIVE 04/29/2021 2234   HGBUR MODERATE (A) 04/29/2021 2234   BILIRUBINUR NEGATIVE 04/29/2021 [place] 04/29/2021 2234   PROTEINUR 30 (A) 04/29/2021 2234   NITRITE NEGATIVE 04/29/2021 2234   LEUKOCYTESUR LARGE (A) 04/29/2021 2234   Sepsis Labs: @LABRCNTIP (procalcitonin:4,lacticidven:4)  ) Recent Results (from the past 240 hour(s))  Urine Culture     Status: Abnormal   Collection Time: 04/29/21  5:05 PM   Specimen: Urine, Clean Catch  Result Value Ref Range Status   Specimen Description URINE, CLEAN CATCH  Final   Special Requests   Final    NONE Performed at [facility], [place] [address], [place], [place] [address]    Culture >=100,000 COLONIES/mL ENTEROCOCCUS FAECALIS (A)  Final   Report Status 05/02/2021 FINAL  Final   Organism ID, Bacteria ENTEROCOCCUS FAECALIS (A)  Final      Susceptibility   Enterococcus  faecalis - MIC*    AMPICILLIN <=2 SENSITIVE Sensitive     NITROFURANTOIN <=16 SENSITIVE Sensitive     VANCOMYCIN 1 SENSITIVE Sensitive     * >=100,000 COLONIES/mL ENTEROCOCCUS FAECALIS  Resp Panel by RT-PCR (Flu A&B, Covid) Nasopharyngeal Swab     Status: None   Collection Time: 04/29/21  7:08 PM   Specimen: Nasopharyngeal Swab; Nasopharyngeal(NP) swabs in vial transport medium  Result Value Ref Range Status   SARS Coronavirus 2 by RT PCR NEGATIVE NEGATIVE Final    Comment: (NOTE) SARS-CoV-2 target nucleic acids are NOT DETECTED.  The SARS-CoV-2 RNA is generally detectable in upper respiratory specimens during the acute phase of infection. The lowest concentration of SARS-CoV-2 viral copies this assay can detect is 138 copies/mL. A negative result does not preclude SARS-Cov-2 infection and should not be used as the sole basis for treatment or other patient management decisions. A negative result may occur with  improper specimen collection/handling,  submission of specimen other than nasopharyngeal swab, presence of viral mutation(s) within the areas targeted by this assay, and inadequate number of viral copies(<138 copies/mL). A negative result must be combined with clinical observations, patient history, and epidemiological information. The expected result is Negative.  Fact Sheet for Patients:  EntrepreneurPulse.com.au  Fact Sheet for Healthcare Providers:  IncredibleEmployment.be  This test is no t yet approved or cleared by the [place] FDA and  has been authorized for detection and/or diagnosis of SARS-CoV-2 by FDA under an Emergency Use Authorization (EUA). This EUA will remain  in effect (meaning this test can be used) for the duration of the COVID-19 declaration under Section 564(b)(1) of the Act, 21 U.S.C.section 360bbb-3(b)(1), unless the authorization is terminated  or revoked sooner.       Influenza A by PCR NEGATIVE  NEGATIVE Final   Influenza B by PCR NEGATIVE NEGATIVE Final    Comment: (NOTE) The Xpert Xpress SARS-CoV-2/FLU/RSV plus assay is intended as an aid in the diagnosis of influenza from Nasopharyngeal swab specimens and should not be used as a sole basis for treatment. Nasal washings and aspirates are unacceptable for Xpert Xpress SARS-CoV-2/FLU/RSV testing.  Fact Sheet for Patients: EntrepreneurPulse.com.au  Fact Sheet for Healthcare Providers: IncredibleEmployment.be  This test is not yet approved or cleared by the [place] FDA and has been authorized for detection and/or diagnosis of SARS-CoV-2 by FDA under an Emergency Use Authorization (EUA). This EUA will remain in effect (meaning this test can be used) for the duration of the COVID-19 declaration under Section 564(b)(1) of the Act, 21 U.S.C. section 360bbb-3(b)(1), unless the authorization is terminated or revoked.  Performed at [facility], [place] [address], [place], [place] [address]   Body fluid culture w Gram Stain     Status: None   Collection Time: 04/30/21 11:40 AM   Specimen: Lung, Right; Pleural Fluid  Result Value Ref Range Status   Specimen Description PLEURAL FLUID  Final   Special Requests LUNG RIGHT  Final   Gram Stain   Final    WBC PRESENT,BOTH PMN AND MONONUCLEAR NO ORGANISMS SEEN CYTOSPIN SMEAR    Culture   Final    NO GROWTH 3 DAYS Performed at [facility], [place] [address] [place],  [address]    Report Status 05/03/2021 FINAL  Final      Studies: No results found.  Scheduled Meds:    Continuous Infusions:  sodium chloride 250 mL (04/29/21 2317)     LOS: 5 days    Domenic Polite, MD [facility]  05/04/2021, 3:10 PM

## 2021-05-04 NOTE — Progress Notes (Signed)
Patient ID: [name], female   DOB: [birth year], 87 y.o.   MRN: [id number]    Progress Note from the Palliative Medicine Team at [facility]   Patient Name: [name]        Date: 05/04/2021 DOB: 01/23/1930  Age: 87 y.o. MRN#: [id number] Attending Physician: Domenic Polite, MD Primary Care Physician: Garwin Brothers, MD Admit Date: 04/29/2021   Medical records reviewed   87 y.o. female  admitted on 04/29/2021 with past medical history of chronic atrial fibrillation,  dementia with behavioral disturbance, hypertension presents to the ER from the nursing home due to repeated falls on the day of presentation.     Family report continued physical, functional and cognitive decline over the past many months.    Patient evaluated in the ER as a trauma patient.  Trauma scans were all negative.  Patient noted to have atrial fibrillation.  CT scan chest performed due to persistent hypoxia demonstrated bilateral pleural effusions right greater than left that have significantly increased in size since her scan in September 2022.    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  Initial IP consult 06-03-21 with son/HPOA and daughter   This NP visited patient at the bedside as a follow up to  yesterday's [facility], for palliative medicine needs and emotional support.  Patient is lethargic and not responding to gentle touch and verbal stimuli, taking only sips and bites.   She does not have capacity to make her own decisions.  Patient son is currently  patient and holding in the ER.   I spoke to daughter/ [name] # [phone] and gave her a medical updated .   Until son is able to participate in his mother's care, call [name].  Continued conversation regarding current medical situation and treatment plan.  Focus remains comfort and dignity allowing for a natural death.  Family securing decision  to focus on comfort, ultimately hope is to secure residential hospice for end-of-life care.     Plan of Care: -DNR/DNI -Comfort and dignity or focus of care-care allowing a natural death -Diet as tolerated/mouth care -Symptom management    Roxanol 5 mg p.o./sublingual every 8 hours scheduled for underlying back pain    Roxanol 5 mg p.o./sublingual every 2 hours as needed for dyspnea/pain    Ativan: 1 mg p.o./sublingual every 6 hours as needed for anxiety or sleep - Reevaluate in the morning for residential hospice eligibility  Questions and concerns addressed   Discussed with Dr  Broadus John  Total time spent on the unit was 35 minutes  Greater than 50% of the time was spent in counseling and coordination of care  Wadie Lessen NP  Palliative Medicine Team Team Phone # [phone] Pager [phone]

## 2021-05-05 DIAGNOSIS — R531 Weakness: Secondary | ICD-10-CM

## 2021-05-05 NOTE — Progress Notes (Signed)
PROGRESS NOTE  [name] NUU:[id number] DOB: 08/10/1929 DOA: 04/29/2021 PCP: Garwin Brothers, MD  HPI/Recap of past 24 hours: [name] with a history of chronic atrial fibrillation on Eliquis, dementia with behavioral disturbance, hypertension presents to the ER from the nursing home due to repeated falls.  Patient has a history of A. fib is on Eliquis.   Trauma scans were all negative. CT scan chest performed due to persistent hypoxia demonstrated bilateral pleural effusions right greater than left that have significantly increased in size since her scan in September 2022.   -Underwent thoracentesis in IR on 10/14: 600 mL drained, 644ml drained Patient recently seen by palliative care as an outpatient.  Son was hopeful that the patient could go to [facility] under hospice care.   -Seen by palliative care, now comfort measures   Assessment/Plan:  Acute hypoxic respiratory failure secondary to bilateral pleural effusions, right greater than left, Acute on chronic systolic CHF, EF 34% with RV dysfunction Pulmonary hypertension, moderate MR, moderate to severe TR Post thoracentesis on 04/30/2021 by interventional radiologist, 600 cc of hazy yellow fluid was removed. 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      DVT prophylaxis: SCDs Code Status: DNR/DNI  Disposition Plan: residential hospice Consults called: Palliative medicine, IR   Procedure: thoracentesis  in IR on 10/14: 600 mL drained, 632ml drained   Objective: Vitals:   05/04/21 1828 05/04/21 2000 05/04/21 2200 05/05/21 1209  BP: 114/72 119/71  113/72  Pulse: (!) 109 (!) 160 96 85  Resp: 16 (!) 32    Temp: 98.3 F (36.8 C) 98 F (36.7 C)  97.7 F (36.5 C)  TempSrc:  Oral  Axillary  SpO2:  100%    Weight:      Height:       No intake or output data in the 24 hours ending 05/05/21 1220  Filed Weights   05/01/21 0500 05/02/21 0500 05/03/21 0356  Weight: 44.5 kg 45.1 kg 45.3 kg    Exam:  Not responsive to verbal or tactile stimuli  Data Reviewed: CBC: Recent Labs  Lab 04/29/21 1754 04/30/21 0600 05/02/21 0251  WBC 12.5* 10.0 10.4  NEUTROABS 10.8* 8.0*  --   HGB 12.5 10.5* 12.1  HCT 41.2 33.2* 37.3  MCV 102.0* 99.4 96.6  PLT 328 280 742   Basic Metabolic Panel: Recent Labs  Lab 04/29/21 1754 04/30/21 0600 05/02/21 0251 05/02/21 1433 05/03/21 0238  NA 139 140 136 138 136  K 3.4* 3.8 2.7* 4.6 4.2  CL 103 106 97* 98 98  CO2 27 26 28 23 30   GLUCOSE 130* 122* 142* 150* 169*  BUN 20 16 23  30* 27*  CREATININE 1.03* 0.88 1.05* 1.23* 0.99  CALCIUM 9.0 8.6* 8.3* 8.7* 8.8*  MG  --  1.9 2.1  --   --   PHOS  --   --  3.1  --   --    GFR: Estimated Creatinine Clearance: 27.5 mL/min (by C-G formula based on SCr of 0.99 mg/dL). Liver Function  Tests: Recent Labs  Lab 04/30/21 0600  AST 16  ALT 19  ALKPHOS 121  BILITOT 1.0  PROT 5.4*  ALBUMIN 2.1*   No results for input(s): LIPASE, AMYLASE in the last 168 hours. No results for input(s): AMMONIA in the last 168 hours. Coagulation Profile: No results for input(s): INR, PROTIME in the last 168 hours. Cardiac Enzymes: No results for input(s): CKTOTAL, CKMB, CKMBINDEX, TROPONINI in the last 168 hours. BNP (last 3 results) No results for input(s): PROBNP in the last 8760 hours. HbA1C: No results for input(s): HGBA1C in the last 72 hours. CBG: No results for input(s): GLUCAP in the last 168 hours. Lipid  Profile: No results for input(s): CHOL, HDL, LDLCALC, TRIG, CHOLHDL, LDLDIRECT in the last 72 hours. Thyroid Function Tests: No results for input(s): TSH, T4TOTAL, FREET4, T3FREE, THYROIDAB in the last 72 hours. Anemia Panel: No results for input(s): VITAMINB12, FOLATE, FERRITIN, TIBC, IRON, RETICCTPCT in the last 72 hours. Urine analysis:    Component Value Date/Time   COLORURINE YELLOW 04/29/2021 2234   APPEARANCEUR CLOUDY (A) 04/29/2021 2234   LABSPEC 1.020 04/29/2021 2234   PHURINE 7.0 04/29/2021 2234   GLUCOSEU NEGATIVE 04/29/2021 2234   HGBUR MODERATE (A) 04/29/2021 2234   [place] 04/29/2021 [place] 04/29/2021 2234   PROTEINUR 30 (A) 04/29/2021 2234   NITRITE NEGATIVE 04/29/2021 2234   LEUKOCYTESUR LARGE (A) 04/29/2021 2234    Recent Results (from the past 240 hour(s))  Urine Culture     Status: Abnormal   Collection Time: 04/29/21  5:05 PM   Specimen: Urine, Clean Catch  Result Value Ref Range Status   Specimen Description URINE, CLEAN CATCH  Final   Special Requests   Final    NONE Performed at [facility], [place] [address] [place], [place] [address]    Culture >=100,000 COLONIES/mL ENTEROCOCCUS FAECALIS (A)  Final   Report Status 05/02/2021 FINAL  Final   Organism ID, Bacteria ENTEROCOCCUS FAECALIS (A)  Final      Susceptibility   Enterococcus faecalis - MIC*    AMPICILLIN <=2 SENSITIVE Sensitive     NITROFURANTOIN <=16 SENSITIVE Sensitive     VANCOMYCIN 1 SENSITIVE Sensitive     * >=100,000 COLONIES/mL ENTEROCOCCUS FAECALIS  Resp Panel by RT-PCR (Flu A&B, Covid) Nasopharyngeal Swab     Status: None   Collection Time: 04/29/21  7:08 PM   Specimen: Nasopharyngeal Swab; Nasopharyngeal(NP) swabs in vial transport medium  Result Value Ref Range Status   SARS Coronavirus 2 by RT PCR NEGATIVE NEGATIVE Final    Comment: (NOTE) SARS-CoV-2 target nucleic acids are NOT DETECTED.  The SARS-CoV-2 RNA is generally detectable in upper  respiratory specimens during the acute phase of infection. The lowest concentration of SARS-CoV-2 viral copies this assay can detect is 138 copies/mL. A negative result does not preclude SARS-Cov-2 infection and should not be used as the sole basis for treatment or other patient management decisions. A negative result may occur with  improper specimen collection/handling, submission of specimen other than nasopharyngeal swab, presence of viral mutation(s) within the areas targeted by this assay, and inadequate number of viral copies(<138 copies/mL). A negative result must be combined with clinical observations, patient history, and epidemiological information. The expected result is Negative.  Fact Sheet for Patients:  EntrepreneurPulse.com.au  Fact Sheet for Healthcare Providers:  IncredibleEmployment.be  This test is no t yet approved or cleared by the [place] FDA and  has been authorized for detection and/or diagnosis  of SARS-CoV-2 by FDA under an Emergency Use Authorization (EUA). This EUA will remain  in effect (meaning this test can be used) for the duration of the COVID-19 declaration under Section 564(b)(1) of the Act, 21 U.S.C.section 360bbb-3(b)(1), unless the authorization is terminated  or revoked sooner.       Influenza A by PCR NEGATIVE NEGATIVE Final   Influenza B by PCR NEGATIVE NEGATIVE Final    Comment: (NOTE) The Xpert Xpress SARS-CoV-2/FLU/RSV plus assay is intended as an aid in the diagnosis of influenza from Nasopharyngeal swab specimens and should not be used as a sole basis for treatment. Nasal washings and aspirates are unacceptable for Xpert Xpress SARS-CoV-2/FLU/RSV testing.  Fact Sheet for Patients: EntrepreneurPulse.com.au  Fact Sheet for Healthcare Providers: IncredibleEmployment.be  This test is not yet approved or cleared by the [place] FDA and has been  authorized for detection and/or diagnosis of SARS-CoV-2 by FDA under an Emergency Use Authorization (EUA). This EUA will remain in effect (meaning this test can be used) for the duration of the COVID-19 declaration under Section 564(b)(1) of the Act, 21 U.S.C. section 360bbb-3(b)(1), unless the authorization is terminated or revoked.  Performed at [facility], [place] [address], [place], [place] [address]   Body fluid culture w Gram Stain     Status: None   Collection Time: 04/30/21 11:40 AM   Specimen: Lung, Right; Pleural Fluid  Result Value Ref Range Status   Specimen Description PLEURAL FLUID  Final   Special Requests LUNG RIGHT  Final   Gram Stain   Final    WBC PRESENT,BOTH PMN AND MONONUCLEAR NO ORGANISMS SEEN CYTOSPIN SMEAR    Culture   Final    NO GROWTH 3 DAYS Performed at [facility], [place] [address] [place],  [address]    Report Status 05/03/2021 FINAL  Final      Studies: No results found.  Scheduled Meds:  morphine CONCENTRATE  5 mg Oral Q8H     Continuous Infusions:  sodium chloride 250 mL (04/29/21 2317)     LOS: 6 days    Geradine Girt, DO [facility]  05/05/2021, 12:20 PM

## 2021-05-05 NOTE — Plan of Care (Signed)
  Problem: Clinical Measurements: Goal: Will remain free from infection Outcome: Progressing   Problem: Coping: Goal: Level of anxiety will decrease Outcome: Progressing   Problem: Pain Managment: Goal: General experience of comfort will improve Outcome: Progressing   Problem: Safety: Goal: Ability to remain free from injury will improve Outcome: Progressing   

## 2021-05-05 NOTE — Care Management Important Message (Signed)
Important Message  Patient Details  Name: [name] MRN: [id number] Date of Birth: Sep 06, 1929   Medicare Important Message Given:  Yes     Kimberla Driskill 05/05/2021, 3:31 PM

## 2021-05-05 NOTE — Progress Notes (Signed)
Patient ID: [name], female   DOB: [birth year], 87 y.o.   MRN: [id number]    Progress Note from the Palliative Medicine Team at [facility]   Patient Name: [name]        Date: 05/05/2021 DOB: 29-Apr-1930  Age: 87 y.o. MRN#: [id number] Attending Physician: Geradine Girt, DO Primary Care Physician: Garwin Brothers, MD Admit Date: 04/29/2021   Medical records reviewed   87 y.o. female  admitted on 04/29/2021 with past medical history of chronic atrial fibrillation,  dementia with behavioral disturbance, hypertension presents to the ER from the nursing home due to repeated falls on the day of presentation.     Family report continued physical, functional and cognitive decline over the past many months.    Patient evaluated in the ER as a trauma patient.  Trauma scans were all negative.  Patient noted to have atrial fibrillation.  CT scan chest performed due to persistent hypoxia demonstrated bilateral pleural effusions right greater than left that have significantly increased in size since her scan in September 2022.    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  Initial IP consult 06-03-21 with son/HPOA and daughter   This NP visited patient at the bedside as a follow up to  yesterday's [place], for palliative medicine needs and emotional support.  Patient is lethargic and slowly  responding to gentle touch and verbal stimuli, taking only sips and bites.   She does not have capacity to make her own decisions.  She appears weaker today   Patient son is currently  patient on 79M.    I spoke to daughter/ Jearld Adjutant # [phone] and gave her a medical updated .   I also spoke with son in his hospital room.    Continued conversation regarding current medical situation and treatment plan.  Focus remains comfort and dignity allowing for a natural death.  Family comfortable with decision to focus  on comfort, ultimately hope is to secure residential hospice for end-of-life care.     Plan of Care: -DNR/DNI -Comfort and dignity or focus of care-care allowing a natural death -Diet as tolerated/mouth care -Symptom management    Roxanol 5 mg p.o./sublingual every 8 hours scheduled for underlying back pain    Roxanol 5 mg p.o./sublingual every 2 hours as needed for dyspnea/pain    Ativan: 1 mg p.o./sublingual every 6 hours as needed for anxiety or sleep - Family is hopeful for residential hospice/ [facility], will place referral   Questions and concerns addressed   Discussed with LCSW via secure chat  This nurse practitioner informed  the family that I will be out of the hospital until Sunday  morning.  If the patient is still hospitalized I will follow-up at that time.  Call palliative medicine team phone # [phone] with questions or concerns in the interim.   Total time spent on the unit was 35 minutes  Greater than 50% of the time was spent in counseling and coordination of care  Wadie Lessen NP  Palliative Medicine Team Team Phone # [phone] Pager [phone]

## 2021-05-05 NOTE — TOC Initial Note (Signed)
Transition of Care [facility]) - Initial/Assessment Note    Patient Details  Name: Flo Berroa MRN: [id number] Date of Birth: Jan 20, 1930  [facility]) CM/SW Contact:    Joanne Chars, LCSW Phone Number: 05/05/2021, 3:19 PM  Clinical Narrative:  Pt not able to participate in assessment.  CSW spoke with pt daughter [name].  Pt has lived at home with her son [name], who is POA.  Pt has most recently been at [facility] for short term rehab for several weeks, prior to this admission.  [name] is also currently hospitalized at [facility] and less available to participate at this time.  [name] and [name] did meet with Mary/palliative.  She confirms that they want to pursue residential hospice placement at [facility].  CSW discussed the process, discussed choice, per Idolina Primer had requested [facility] place prior to his hospitalization and she is in agreement with that. Choice document placed in pt room.    CSW made referral to Bevely Palmer at [facility]                  Expected Discharge Plan: Hospice Medical Facility Barriers to Discharge: Other (must enter comment) (pending residential hospice)   Patient Goals and CMS Choice Patient states their goals for this hospitalization and ongoing recovery are:: comfort CMS Medicare.gov Compare Post Acute Care list provided to:: Patient Represenative (must comment) Choice offered to / list presented to : Adult Children  Expected Discharge Plan and Services Expected Discharge Plan: [place]     Post Acute Care Choice: Hospice Living arrangements for the past 2 months: Single Family Home                                      Prior Living Arrangements/Services Living arrangements for the past 2 months: Single Family Home Lives with:: Adult Children (with son) Patient language and need for interpreter reviewed:: No        Need for Family Participation in Patient Care: Yes (Comment) Care  giver support system in place?: Yes (comment) Current home services: Other (comment) (none) Criminal Activity/Legal Involvement Pertinent to Current Situation/Hospitalization: No - Comment as needed  Activities of Daily Living      Permission Sought/Granted                  Emotional Assessment Appearance:: Appears stated age Attitude/Demeanor/Rapport: Unable to Assess Affect (typically observed): Unable to Assess Orientation: :  (not oriented) Alcohol / Substance Use: Not Applicable Psych Involvement: No (comment)  Admission diagnosis:  Pleural effusion [J90] S/P thoracentesis [Z98.890] Bilateral pleural effusion [J90] Acute respiratory failure with hypoxia (HCC) [J96.01] Patient Active Problem List   Diagnosis Date Noted   Acute respiratory failure with hypoxia ([place]) 04/29/2021   Bilateral pleural effusion 04/29/2021   Fall 04/29/2021   A-fib ([place]) 04/29/2021   Benign essential HTN 04/29/2021   DNR (do not resuscitate)/DNI(Do not intubate) 04/29/2021   Dementia with behavioral disturbance 04/29/2021   PCP:  Garwin Brothers, MD Pharmacy:  No Pharmacies Listed    Social Determinants of Health (SDOH) Interventions    Readmission Risk Interventions No flowsheet data found.

## 2021-05-06 NOTE — Telephone Encounter (Signed)
Pt ready for scheduling on or after 01/02/21 Juluis Rainier looks like pt was placed in palliative care 04/16/21.  Out-of-pocket cost due at time of visit: $295  Primary: UHC Medicare [place] co-insurance: 20% (approximately $270) Admin fee co-insurance: 20% (approximately $25)  Secondary: n/a [place] co-insurance:  Admin fee co-insurance:   Deductible: does not apply  Prior Auth: APPROVED PA# [id number] Valid: 05/06/21-05/06/22  ** This summary of benefits is an estimation of the patient's out-of-pocket cost. Exact cost may vary based on individual plan coverage.

## 2021-05-06 NOTE — Progress Notes (Signed)
PROGRESS NOTE  [name] EYC:[id number] DOB: [birth year] DOA: 04/29/2021 PCP: Garwin Brothers, MD  HPI/Recap of past 24 hours: [name] with a history of chronic atrial fibrillation on Eliquis, dementia with behavioral disturbance, hypertension presents to the ER from the nursing home due to repeated falls.  Patient has a history of A. fib is on Eliquis.   Trauma scans were all negative. CT scan chest performed due to persistent hypoxia demonstrated bilateral pleural effusions right greater than left that have significantly increased in size since her scan in September 2022.   -Underwent thoracentesis in IR on 10/14: 600 mL drained, 66ml drained Patient recently seen by palliative care as an outpatient.  Son was hopeful that the patient could go to [facility] under hospice care.   -Seen by palliative care, now comfort measures -awaiting residential hospice   Assessment/Plan:  Acute hypoxic respiratory failure secondary to bilateral pleural effusions, right greater than left, Acute on chronic systolic CHF, EF 14% with RV dysfunction Pulmonary hypertension, moderate MR, moderate to severe TR Post thoracentesis on 04/30/2021 by interventional radiologist, 600 cc of hazy yellow fluid was removed. 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      DVT prophylaxis: SCDs Code Status: DNR/DNI Disposition Plan: residential hospice Consults called: Palliative medicine, IR   Procedure: thoracentesis in IR on 10/14: 600 mL  drained, 625ml drained   Objective: Vitals:   05/04/21 1828 05/04/21 2000 05/04/21 2200 05/05/21 1209  BP: 114/72 119/71  113/72  Pulse: (!) 109 (!) 160 96 85  Resp: 16 (!) 32    Temp: 98.3 F (36.8 C) 98 F (36.7 C)  97.7 F (36.5 C)  TempSrc:  Oral  Axillary  SpO2:  100%    Weight:      Height:       No intake or output data in the 24 hours ending 05/06/21 1322  Filed Weights   05/01/21 0500 05/02/21 0500 05/03/21 0356  Weight: 44.5 kg 45.1 kg 45.3 kg    Exam:  Will awaken   Data Reviewed: CBC: Recent Labs  Lab 04/29/21 1754 04/30/21 0600 05/02/21 0251  WBC 12.5* 10.0 10.4  NEUTROABS 10.8* 8.0*  --   HGB 12.5 10.5* 12.1  HCT 41.2 33.2* 37.3  MCV 102.0* 99.4 96.6  PLT 328 280 970   Basic Metabolic Panel: Recent Labs  Lab 04/29/21 1754 04/30/21 0600 05/02/21 0251 05/02/21 1433 05/03/21 0238  NA 139 140 136 138 136  K 3.4* 3.8 2.7* 4.6 4.2  CL 103 106 97* 98 98  CO2 27 26 28 23 30   GLUCOSE 130* 122* 142* 150* 169*  BUN 20 16 23  30* 27*  CREATININE 1.03* 0.88 1.05* 1.23* 0.99  CALCIUM 9.0 8.6* 8.3* 8.7* 8.8*  MG  --  1.9 2.1  --   --   PHOS  --   --  3.1  --   --    GFR: Estimated Creatinine Clearance: 27.5 mL/min (by C-G formula based on SCr of 0.99 mg/dL). Liver Function Tests: Recent Labs  Lab 04/30/21 0600  AST 16  ALT 19  ALKPHOS 121  BILITOT 1.0  PROT 5.4*  ALBUMIN 2.1*   No results for input(s): LIPASE, AMYLASE in the last 168 hours. No results for input(s): AMMONIA in the last 168 hours. Coagulation Profile: No results for input(s): INR, PROTIME in the last 168 hours. Cardiac Enzymes: No results for input(s): CKTOTAL, CKMB, CKMBINDEX, TROPONINI in the last 168 hours. BNP (last 3 results) No results for input(s): PROBNP in the last 8760 hours. HbA1C: No results for input(s): HGBA1C in the last 72 hours. CBG: No results for input(s): GLUCAP in the last 168 hours. Lipid Profile: No results for input(s): CHOL, HDL, LDLCALC,  TRIG, CHOLHDL, LDLDIRECT in the last 72 hours. Thyroid Function Tests: No results for input(s): TSH, T4TOTAL, FREET4, T3FREE, THYROIDAB in the last 72 hours. Anemia Panel: No results for input(s): VITAMINB12, FOLATE, FERRITIN, TIBC, IRON, RETICCTPCT in the last 72 hours. Urine analysis:    Component Value Date/Time   COLORURINE YELLOW 04/29/2021 2234   APPEARANCEUR CLOUDY (A) 04/29/2021 2234   LABSPEC 1.020 04/29/2021 2234   PHURINE 7.0 04/29/2021 2234   GLUCOSEU NEGATIVE 04/29/2021 2234   HGBUR MODERATE (A) 04/29/2021 2234   [place] 04/29/2021 [place] 04/29/2021 2234   PROTEINUR 30 (A) 04/29/2021 2234   NITRITE NEGATIVE 04/29/2021 2234   LEUKOCYTESUR LARGE (A) 04/29/2021 2234    Recent Results (from the past 240 hour(s))  Urine Culture     Status: Abnormal   Collection Time: 04/29/21  5:05 PM   Specimen: Urine, Clean Catch  Result Value Ref Range Status   Specimen Description URINE, CLEAN CATCH  Final   Special Requests   Final    NONE Performed at [facility], [place] [address], [place], [place] [address]    Culture >=100,000 COLONIES/mL ENTEROCOCCUS FAECALIS (A)  Final   Report Status 05/02/2021 FINAL  Final   Organism ID, Bacteria ENTEROCOCCUS FAECALIS (A)  Final      Susceptibility   Enterococcus faecalis - MIC*    AMPICILLIN <=2 SENSITIVE Sensitive     NITROFURANTOIN <=16 SENSITIVE Sensitive     VANCOMYCIN 1 SENSITIVE Sensitive     * >=100,000 COLONIES/mL ENTEROCOCCUS FAECALIS  Resp Panel by RT-PCR (Flu A&B, Covid) Nasopharyngeal Swab     Status: None   Collection Time: 04/29/21  7:08 PM   Specimen: Nasopharyngeal Swab; Nasopharyngeal(NP) swabs in vial transport medium  Result Value Ref Range Status   SARS Coronavirus 2 by RT PCR NEGATIVE NEGATIVE Final    Comment: (NOTE) SARS-CoV-2 target nucleic acids are NOT DETECTED.  The SARS-CoV-2 RNA is generally detectable in upper respiratory specimens during the acute phase of  infection. The lowest concentration of SARS-CoV-2 viral copies this assay can detect is 138 copies/mL. A negative result does not preclude SARS-Cov-2 infection and should not be used as the sole basis for treatment or other patient management decisions. A negative result may occur with  improper specimen collection/handling, submission of specimen other than nasopharyngeal swab, presence of viral mutation(s) within the areas targeted by this assay, and inadequate number of viral copies(<138 copies/mL). A negative result must be combined with clinical observations, patient history, and epidemiological information. The expected result is Negative.  Fact Sheet for Patients:  EntrepreneurPulse.com.au  Fact Sheet for Healthcare Providers:  IncredibleEmployment.be  This test is no t yet approved or cleared by the [place] FDA and  has been authorized for detection and/or diagnosis of SARS-CoV-2 by FDA under an Emergency Use Authorization (EUA). This  EUA will remain  in effect (meaning this test can be used) for the duration of the COVID-19 declaration under Section 564(b)(1) of the Act, 21 U.S.C.section 360bbb-3(b)(1), unless the authorization is terminated  or revoked sooner.       Influenza A by PCR NEGATIVE NEGATIVE Final   Influenza B by PCR NEGATIVE NEGATIVE Final    Comment: (NOTE) The Xpert Xpress SARS-CoV-2/FLU/RSV plus assay is intended as an aid in the diagnosis of influenza from Nasopharyngeal swab specimens and should not be used as a sole basis for treatment. Nasal washings and aspirates are unacceptable for Xpert Xpress SARS-CoV-2/FLU/RSV testing.  Fact Sheet for Patients: EntrepreneurPulse.com.au  Fact Sheet for Healthcare Providers: IncredibleEmployment.be  This test is not yet approved or cleared by the [place] FDA and has been authorized for detection and/or diagnosis of SARS-CoV-2  by FDA under an Emergency Use Authorization (EUA). This EUA will remain in effect (meaning this test can be used) for the duration of the COVID-19 declaration under Section 564(b)(1) of the Act, 21 U.S.C. section 360bbb-3(b)(1), unless the authorization is terminated or revoked.  Performed at [facility], [place] [address], [place], [place] [address]   Body fluid culture w Gram Stain     Status: None   Collection Time: 04/30/21 11:40 AM   Specimen: Lung, Right; Pleural Fluid  Result Value Ref Range Status   Specimen Description PLEURAL FLUID  Final   Special Requests LUNG RIGHT  Final   Gram Stain   Final    WBC PRESENT,BOTH PMN AND MONONUCLEAR NO ORGANISMS SEEN CYTOSPIN SMEAR    Culture   Final    NO GROWTH 3 DAYS Performed at [facility], [place] [address], [place], [place] [address]    Report Status 05/03/2021 FINAL  Final      Studies: No results found.  Scheduled Meds:  morphine CONCENTRATE  5 mg Oral Q8H     Continuous Infusions:  sodium chloride 250 mL (04/29/21 2317)     LOS: 7 days    Geradine Girt, DO [facility]  05/06/2021, 1:22 PM

## 2021-05-06 NOTE — Plan of Care (Signed)
  Problem: Clinical Measurements: Goal: Will remain free from infection Outcome: Progressing Goal: Respiratory complications will improve Outcome: Progressing Goal: Cardiovascular complication will be avoided Outcome: Progressing   

## 2021-05-06 NOTE — Plan of Care (Signed)

## 2021-05-06 NOTE — Progress Notes (Addendum)
[place] 2W10 [facility]) Hospital Liaison Note  Received request from Transitions of Care Manager for family interest in [facility]. Chart reviewed and spoke with family to acknowledge referral. Patient hospice eligibility status is approved. Unfortunately, [facility] is not able to offer a room today. Family and Transitions of Care Manager aware hospital liaison will follow up tomorrow or sooner if room becomes available. Please do not hesitate to call with questions.   Please call with any questions/concerns.    Thank you for the opportunity to participate in this patient's care.  Addendum  2:45-patient set to discharge tomorrow to [facility]. Daughter/Melissa aware and in agreement with DC plan.   Daphene Calamity, MSW [facility] Liaison  [phone]

## 2021-05-07 ENCOUNTER — Encounter (HOSPITAL_COMMUNITY): Payer: Self-pay | Admitting: Emergency Medicine

## 2021-05-07 MED ORDER — MORPHINE SULFATE (CONCENTRATE) 10 MG/0.5ML PO SOLN: 5.0000 mg | Freq: Three times a day (TID) | ORAL | 0 refills | Status: AC

## 2021-05-07 MED ORDER — LORAZEPAM 1 MG PO TABS: 1.0000 mg | ORAL_TABLET | Freq: Four times a day (QID) | ORAL | 0 refills | Status: AC | PRN

## 2021-05-07 MED ORDER — MORPHINE SULFATE (CONCENTRATE) 10 MG/0.5ML PO SOLN: 5.0000 mg | ORAL | 0 refills | Status: AC | PRN

## 2021-05-07 MED ORDER — HALOPERIDOL LACTATE 5 MG/ML IJ SOLN: 5.0000 mg | Freq: Four times a day (QID) | INTRAMUSCULAR | Status: AC | PRN

## 2021-05-07 NOTE — TOC Transition Note (Signed)
Transition of Care [facility]) - CM/SW Discharge Note   Patient Details  Name: [name] MRN: [id number] Date of Birth: [birth year]  Transition [facility]) CM/SW Contact:  Joanne Chars, LCSW Phone Number: 05/07/2021, 11:18 AM   Clinical Narrative:  Pt discharging to [facility].  RN call report to [phone].  PTAR called by [facility], CSW confirmed.      Final next level of care: [place] Barriers to Discharge: Barriers Resolved   Patient Goals and CMS Choice Patient states their goals for this hospitalization and ongoing recovery are:: comfort CMS Medicare.gov Compare Post Acute Care list provided to:: Patient Represenative (must comment) Choice offered to / list presented to : Adult Children  Discharge Placement              Patient chooses bed at:  [facility]) Patient to be transferred to facility by: [place] Name of family member notified: left message with daughter [name] Patient and family notified of of transfer: 05/07/21  Discharge Plan and Services     Post Acute Care Choice: Hospice                               Social Determinants of Health (SDOH) Interventions     Readmission Risk Interventions No flowsheet data found.

## 2021-05-07 NOTE — Progress Notes (Signed)
Report called to nurse Pam at [facility]. Questions and concerns answered.

## 2021-05-07 NOTE — Progress Notes (Addendum)
[place] 2W10 [facility]) Hospital Liaison Note  PTAR notified of patient D/C and transport arranged. TOC/Greg and Attending Physician/Dr. Eliseo Squires also notified of transport arrangement.   Addendum: Please send signed DNR form with patient and RN call report to [phone]  Addendum: Daughter/Melissa notified of transport being arranged. Consent forms have been completed.   Daphene Calamity, MSW [facility] Liaison [phone]

## 2021-05-07 NOTE — Discharge Summary (Signed)
Physician Discharge Summary  [name] EVO:[id number] DOB: 1930-06-02 DOA: 04/29/2021  PCP: Garwin Brothers, MD  Admit date: 04/29/2021 Discharge date: 05/07/2021  Admitted From: home Discharge disposition: hospice   Recommendations for Outpatient Follow-Up:   To residential hospice DNR  Discharge Diagnosis:   Principal Problem:   Acute respiratory failure with hypoxia [facility]) Active Problems:   Bilateral pleural effusion   Fall   A-fib ([place])   Benign essential HTN   DNR (do not resuscitate)/DNI(Do not intubate)   Dementia with behavioral disturbance      Diet recommendation: comfort feeds    History of Present Illness:   [name] with a history of chronic atrial fibrillation on Eliquis, dementia with behavioral disturbance, hypertension presents to the ER from the nursing home due to repeated falls.  Patient has a history of A. fib is on Eliquis.   Trauma scans were all negative. CT scan chest performed due to persistent hypoxia demonstrated bilateral pleural effusions right greater than left that have significantly increased in size since her scan in September 2022.   -Underwent thoracentesis in IR on 10/14: 600 mL drained, 665ml drained Patient recently seen by palliative care as an outpatient.  Son was hopeful that the patient could go to [facility] under hospice care.   -Seen by palliative care, now comfort measures -awaiting residential hospice   Hospital Course by Problem:   Acute hypoxic respiratory failure secondary to bilateral pleural effusions, right greater than left, Acute on chronic systolic CHF, EF 29% with RV dysfunction Pulmonary hypertension, moderate MR, moderate to severe TR Post thoracentesis on 04/30/2021 by interventional radiologist, 600 cc of hazy yellow fluid was removed. 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    Medical Consultants:   Palliative care   Discharge Exam:   Vitals:   05/05/21 1209 05/06/21 1323  BP: 113/72 119/83  Pulse: 85 (!) 104  Resp:    Temp: 97.7 F (36.5 C) 98.5 F (36.9 C)  SpO2:     Vitals:   05/04/21 2000 05/04/21 2200 05/05/21 1209 05/06/21 1323  BP: 119/71  113/72 119/83  Pulse: (!) 160 96 85 (!) 104  Resp: (!) 32     Temp: 98 F (36.7 C)  97.7 F (36.5 C) 98.5 F (36.9 C)  TempSrc: Oral  Axillary Axillary  SpO2: 100%     Weight:      Height:        General exam: Appears calm and comfortable.    The results of significant diagnostics from this hospitalization (including imaging, microbiology, ancillary and laboratory) are listed below for reference.     Procedures and Diagnostic Studies:   DG Chest 1 View  Result Date: 04/30/2021 CLINICAL DATA:  87 year old female status post right thoracentesis. EXAM: CHEST  1 VIEW COMPARISON:  Chest x-ray 04/29/2021. FINDINGS: Previously noted right pleural effusion has decreased considerably in size, now small. Skin fold artifact in the lateral aspect of the right hemithorax. No definitive right-sided pneumothorax confidently identified. Bibasilar opacities (left greater than right), which may  reflect areas of atelectasis and/or consolidation. Small to moderate left pleural effusion. No evidence of pulmonary edema. Scarring or subsegmental atelectasis in the left mid lung. Mild cardiomegaly. The patient is rotated to the left on today's exam, resulting in distortion of the mediastinal contours and reduced diagnostic sensitivity and  specificity for mediastinal pathology. Atherosclerotic calcifications in the thoracic aorta. Old healed right humeral diaphysis fracture incompletely imaged. IMPRESSION: 1. Decreased size of right pleural effusion (now small) following right-sided thoracentesis. No definitive evidence of pneumothorax. 2. Bibasilar areas of atelectasis and/or consolidation (left greater than right) with persistent small to moderate left pleural effusion. 3. Cardiomegaly. 4. Aortic atherosclerosis. Electronically Signed   By: Vinnie Langton M.D.   On: 04/30/2021 12:24   DG Chest 2 View  Result Date: 04/29/2021 CLINICAL DATA:  Fall.  Blood thinners. EXAM: CHEST - 2 VIEW COMPARISON:  Chest x-ray 04/04/2021 FINDINGS: The heart and mediastinal contours are unchanged. Aortic calcification. Biapical pleural/pulmonary scarring. Bilateral lower lobe patchy airspace opacities. No pulmonary edema. At least small volume bilateral pleural effusions. No pneumothorax. No acute osseous abnormality. Compression fracture status post kyphoplasty of likely the L1 level. IMPRESSION: Interval development of at least small volume bilateral pleural effusions. Associated bilateral lower lung zone airspace opacities that may represent infection/inflammation versus atelectasis. Followup PA and lateral chest X-ray is recommended in 3-4 weeks following therapy to ensure resolution and exclude underlying malignancy. Electronically Signed   By: Iven Finn M.D.   On: 04/29/2021 18:07   DG Pelvis 1-2 Views  Result Date: 04/29/2021 CLINICAL DATA:  Status post fall.  On blood thinners. EXAM: PELVIS - 1-2 VIEW COMPARISON:  X-ray pelvis 04/04/2021 FINDINGS: Markedly limited evaluation due to overlapping osseous structures and overlying soft tissues. There is no evidence of pelvic fracture or diastasis. No aggressive appearing pelvic bone lesions are seen. Old healed right femoral fracture status post intramedullary nail fixation. No dislocation of the  right hip. No dislocation of the left hip. No definite acute displaced fracture of the left hip on frontal view. Sacrum grossly unremarkable. Degenerative changes of the visualized lower lumbar spine. Phleboliths overlie the pelvis. IMPRESSION: Negative for acute traumatic injury. Electronically Signed   By: Iven Finn M.D.   On: 04/29/2021 18:09   CT HEAD WO CONTRAST (5MM)  Result Date: 04/29/2021 CLINICAL DATA:  [place], head trauma EXAM: CT HEAD WITHOUT CONTRAST TECHNIQUE: Contiguous axial images were obtained from the base of the skull through the vertex without intravenous contrast. COMPARISON:  04/04/2021 FINDINGS: Brain: No signs of acute infarct or hemorrhage. Stable hypodensities throughout the periventricular white matter consistent with chronic small vessel ischemic change. Lateral ventricles and midline structures are unremarkable. No acute extra-axial fluid collections. No mass effect. Vascular: No hyperdense vessel or unexpected calcification. Stable atherosclerosis. Skull: Right parietal scalp hematoma. No underlying fractures. Remainder of the calvarium is unremarkable. Sinuses/Orbits: No acute finding. Other: None. IMPRESSION: 1. Right parietal scalp hematoma. 2. No acute intracranial process. Electronically Signed   By: Randa Ngo M.D.   On: 04/29/2021 17:29   CT Angio Chest PE W and/or Wo Contrast  Result Date: 04/29/2021 CLINICAL DATA:  Chest pain and shortness of breath. EXAM: CT ANGIOGRAPHY CHEST WITH CONTRAST TECHNIQUE: Multidetector CT imaging of the chest was performed using the standard protocol during bolus administration of intravenous contrast. Multiplanar CT image reconstructions and MIPs were obtained to evaluate the vascular anatomy. CONTRAST:  97mL OMNIPAQUE IOHEXOL 350 MG/ML SOLN COMPARISON:  April 04, 2021 FINDINGS: Cardiovascular: Marked severity calcification of  the aortic arch is seen without evidence of aortic aneurysm or dissection. Satisfactory  opacification of the pulmonary arteries to the segmental level. No evidence of pulmonary embolism. Mild cardiomegaly with marked severity coronary artery calcification. No pericardial effusion. Mediastinum/Nodes: Mild pretracheal and AP window lymphadenopathy is seen. Thyroid gland, trachea, and esophagus demonstrate no significant findings. Lungs/Pleura: Stable, moderate severity areas of biapical scarring and/or atelectasis are seen. Mild anterior right upper lobe, mild inferior left upper lobe and moderate to marked severity bibasilar atelectasis and/or infiltrate is seen. This is a new finding when compared to the prior study. Moderate severity bilateral pleural effusions are noted, right greater than left. These are both increased in size when compared to the prior study. No pneumothorax is identified. Upper Abdomen: A 2.3 cm x 1.9 cm low-attenuation left adrenal mass is seen (approximately 23.17 Hounsfield units). Musculoskeletal: Multiple chronic left-sided rib fractures are seen. A compression fracture deformity is seen at the level of T9 with prior vertebroplasty noted at the level of L1. Review of the MIP images confirms the above findings. IMPRESSION: 1. No evidence of pulmonary embolus. 2. Moderate severity bilateral pleural effusions, right greater than left, increased in size when compared to the prior study. 3. Mild anterior right upper lobe, mild inferior left upper lobe and moderate to marked severity bibasilar atelectasis and/or infiltrate. 4. Stable cardiomegaly with marked severity coronary artery calcification. 5. Low-attenuation left adrenal mass which may represent an adrenal adenoma. Further evaluation with nonemergent adrenal protocol CT is recommended. Aortic Atherosclerosis (ICD10-I70.0). Electronically Signed   By: Virgina Norfolk M.D.   On: 04/29/2021 22:15   CT Cervical Spine Wo Contrast  Result Date: 04/29/2021 CLINICAL DATA:  [place] EXAM: CT CERVICAL SPINE WITHOUT CONTRAST  TECHNIQUE: Multidetector CT imaging of the cervical spine was performed without intravenous contrast. Multiplanar CT image reconstructions were also generated. COMPARISON:  04/04/2021 FINDINGS: Alignment: Reversal cervical lordosis centered at C4 unchanged, likely due to multilevel spondylosis. Otherwise alignment is anatomic. Skull base and vertebrae: No acute fracture. No primary bone lesion or focal pathologic process. Soft tissues and spinal canal: No prevertebral fluid or swelling. No visible canal hematoma. Disc levels: Extensive multilevel spondylosis and facet hypertrophy again identified, most pronounced from C3-4 through C5-6. Upper chest: Mucoid material within the trachea. Right pleural effusion is noted. Other: Reconstructed images demonstrate no additional findings. IMPRESSION: 1. No acute cervical spine fracture. 2. Stable multilevel spondylosis and facet hypertrophy. Electronically Signed   By: Randa Ngo M.D.   On: 04/29/2021 17:27   ECHOCARDIOGRAM COMPLETE  Result Date: 04/30/2021    ECHOCARDIOGRAM REPORT   Patient Name:   [name] Date of Exam: 04/30/2021 Medical Rec #:  [id number]    Height:       65.0 in Accession #:    [id number]   Weight:       100.0 lb Date of Birth:  1929-11-02    BSA:          1.473 m Patient Age:    21 years     BP:           127/88 mmHg Patient Gender: F            HR:           130 bpm. Exam Location:  Inpatient Procedure: 2D Echo, Cardiac Doppler and Color Doppler Indications:    AFIB  History:        Patient has no prior history of Echocardiogram examinations.  Arrythmias:Atrial Fibrillation; Risk Factors:Hypertension.  Sonographer:    Meagan Baucom RDCS, FE, PE Referring Phys: [place]  1. Left ventricular ejection fraction, by estimation, is 45 to 50%. The left ventricle has mildly decreased function. The left ventricle demonstrates global hypokinesis. Left ventricular diastolic function could not be evaluated. There is  the interventricular septum is flattened in systole and diastole, consistent with right ventricular pressure and volume overload.  2. Right ventricular systolic function is moderately reduced. The right ventricular size is moderately enlarged. There is severely elevated pulmonary artery systolic pressure.  3. Left atrial size was moderately dilated.  4. Right atrial size was moderately dilated.  5. A small pericardial effusion is present. The pericardial effusion is posterior to the left ventricle.  6. The mitral valve is myxomatous. Mild to moderate mitral valve regurgitation. There is mild late systolic prolapse of both leaflets of the mitral valve.  7. The tricuspid valve is myxomatous. Tricuspid valve regurgitation is moderate to severe.  8. The aortic valve is tricuspid. There is moderate thickening of the aortic valve. Aortic valve regurgitation is mild. Mild to moderate aortic valve sclerosis/calcification is present, without any evidence of aortic stenosis.  9. The inferior vena cava is dilated in size with <50% respiratory variability, suggesting right atrial pressure of 15 mmHg. FINDINGS  Left Ventricle: Left ventricular ejection fraction, by estimation, is 45 to 50%. The left ventricle has mildly decreased function. The left ventricle demonstrates global hypokinesis. The left ventricular internal cavity size was normal in size. There is  no left ventricular hypertrophy. The interventricular septum is flattened in systole and diastole, consistent with right ventricular pressure and volume overload. Left ventricular diastolic function could not be evaluated due to atrial fibrillation. Left ventricular diastolic function could not be evaluated. Right Ventricle: The right ventricular size is moderately enlarged. No increase in right ventricular wall thickness. Right ventricular systolic function is moderately reduced. There is severely elevated pulmonary artery systolic pressure. The tricuspid regurgitant  velocity is 3.53 m/s, and with an assumed right atrial pressure of 15 mmHg, the estimated right ventricular systolic pressure is 54.6 mmHg. Left Atrium: Left atrial size was moderately dilated. Right Atrium: Right atrial size was moderately dilated. Pericardium: A small pericardial effusion is present. The pericardial effusion is posterior to the left ventricle. Mitral Valve: The mitral valve is myxomatous. There is mild late systolic prolapse of both leaflets of the mitral valve. Mild to moderate mitral valve regurgitation, with centrally-directed jet. Tricuspid Valve: The tricuspid valve is myxomatous. Tricuspid valve regurgitation is moderate to severe. There is mild prolapse of the tricuspid. The flow in the hepatic veins is reversed during ventricular systole. Aortic Valve: The aortic valve is tricuspid. There is moderate thickening of the aortic valve. Aortic valve regurgitation is mild. Mild to moderate aortic valve sclerosis/calcification is present, without any evidence of aortic stenosis. Pulmonic Valve: The pulmonic valve was normal in structure. Pulmonic valve regurgitation is trivial. Aorta: The aortic root and ascending aorta are structurally normal, with no evidence of dilitation. Venous: The inferior vena cava is dilated in size with less than 50% respiratory variability, suggesting right atrial pressure of 15 mmHg. IAS/Shunts: No atrial level shunt detected by color flow Doppler.  LEFT VENTRICLE PLAX 2D LVIDd:         3.70 cm   Diastology LVIDs:         3.00 cm   LV e' medial: 3.92 cm/s LV PW:         0.90  cm LV IVS:        0.90 cm LVOT diam:     1.70 cm LV SV:         23 LV SV Index:   16 LVOT Area:     2.27 cm  LEFT ATRIUM             Index        RIGHT ATRIUM           Index LA diam:        3.86 cm 2.62 cm/m   RA Area:     19.70 cm LA Vol (A2C):   61.2 ml 41.54 ml/m  RA Volume:   57.30 ml  38.89 ml/m LA Vol (A4C):   40.8 ml 27.69 ml/m LA Biplane Vol: 51.0 ml 34.61 ml/m  AORTIC VALVE LVOT  Vmax:   78.30 cm/s LVOT Vmean:  49.800 cm/s LVOT VTI:    0.103 m  AORTA Ao Root diam: 3.30 cm Ao Asc diam:  3.10 cm TRICUSPID VALVE TR Peak grad:   49.8 mmHg TR Vmax:        353.00 cm/s  SHUNTS Systemic VTI:  0.10 m Systemic Diam: 1.70 cm Dani Gobble Croitoru MD Electronically signed by Sanda Klein MD Signature Date/Time: 04/30/2021/7:52:11 PM    Final    IR THORACENTESIS ASP PLEURAL SPACE W/IMG GUIDE  Result Date: 04/30/2021 INDICATION: Shortness of breath, bilateral pleural effusions seen on previous chest x-ray. Request for therapeutic and diagnostic thoracentesis. EXAM: ULTRASOUND GUIDED RIGHT THORACENTESIS MEDICATIONS: 7 mL 1% lidocaine COMPLICATIONS: None immediate. PROCEDURE: An ultrasound guided thoracentesis was thoroughly discussed with the patient and questions answered. The benefits, risks, alternatives and complications were also discussed. The patient understands and wishes to proceed with the procedure. Written consent was obtained. Ultrasound was performed to localize and mark an adequate pocket of fluid in the right chest. The area was then prepped and draped in the normal sterile fashion. 1% Lidocaine was used for local anesthesia. Under ultrasound guidance a 6 Fr Safe-T-Centesis catheter was introduced. Thoracentesis was performed. The catheter was removed and a dressing applied. FINDINGS: A total of approximately 600 cc of hazy yellow fluid was removed. Samples were sent to the laboratory as requested by the clinical team. Post procedure chest X-ray reviewed, negative for pneumothorax. IMPRESSION: Successful ultrasound guided right thoracentesis yielding 600 cc of pleural fluid. Read by: Durenda Guthrie, PA-C Electronically Signed   By: Sandi Mariscal M.D.   On: 04/30/2021 15:34     Labs:   Basic Metabolic Panel: Recent Labs  Lab 05/02/21 0251 05/02/21 1433 05/03/21 0238  NA 136 138 136  K 2.7* 4.6 4.2  CL 97* 98 98  CO2 28 23 30   GLUCOSE 142* 150* 169*  BUN 23 30* 27*  CREATININE  1.05* 1.23* 0.99  CALCIUM 8.3* 8.7* 8.8*  MG 2.1  --   --   PHOS 3.1  --   --    GFR Estimated Creatinine Clearance: 27.5 mL/min (by C-G formula based on SCr of 0.99 mg/dL). Liver Function Tests: No results for input(s): AST, ALT, ALKPHOS, BILITOT, PROT, ALBUMIN in the last 168 hours. No results for input(s): LIPASE, AMYLASE in the last 168 hours. No results for input(s): AMMONIA in the last 168 hours. Coagulation profile No results for input(s): INR, PROTIME in the last 168 hours.  CBC: Recent Labs  Lab 05/02/21 0251  WBC 10.4  HGB 12.1  HCT 37.3  MCV 96.6  PLT 307   Cardiac Enzymes: No  results for input(s): CKTOTAL, CKMB, CKMBINDEX, TROPONINI in the last 168 hours. BNP: Invalid input(s): POCBNP CBG: No results for input(s): GLUCAP in the last 168 hours. D-Dimer No results for input(s): DDIMER in the last 72 hours. Hgb A1c No results for input(s): HGBA1C in the last 72 hours. Lipid Profile No results for input(s): CHOL, HDL, LDLCALC, TRIG, CHOLHDL, LDLDIRECT in the last 72 hours. Thyroid function studies No results for input(s): TSH, T4TOTAL, T3FREE, THYROIDAB in the last 72 hours.  Invalid input(s): FREET3 Anemia work up No results for input(s): VITAMINB12, FOLATE, FERRITIN, TIBC, IRON, RETICCTPCT in the last 72 hours. Microbiology Recent Results (from the past 240 hour(s))  Urine Culture     Status: Abnormal   Collection Time: 04/29/21  5:05 PM   Specimen: Urine, Clean Catch  Result Value Ref Range Status   Specimen Description URINE, CLEAN CATCH  Final   Special Requests   Final    NONE Performed at [facility], [place] [address] [place], [place] [address]    Culture >=100,000 COLONIES/mL ENTEROCOCCUS FAECALIS (A)  Final   Report Status 05/02/2021 FINAL  Final   Organism ID, Bacteria ENTEROCOCCUS FAECALIS (A)  Final      Susceptibility   Enterococcus faecalis - MIC*    AMPICILLIN <=2 SENSITIVE Sensitive     NITROFURANTOIN <=16 SENSITIVE Sensitive      VANCOMYCIN 1 SENSITIVE Sensitive     * >=100,000 COLONIES/mL ENTEROCOCCUS FAECALIS  Resp Panel by RT-PCR (Flu A&B, Covid) Nasopharyngeal Swab     Status: None   Collection Time: 04/29/21  7:08 PM   Specimen: Nasopharyngeal Swab; Nasopharyngeal(NP) swabs in vial transport medium  Result Value Ref Range Status   SARS Coronavirus 2 by RT PCR NEGATIVE NEGATIVE Final    Comment: (NOTE) SARS-CoV-2 target nucleic acids are NOT DETECTED.  The SARS-CoV-2 RNA is generally detectable in upper respiratory specimens during the acute phase of infection. The lowest concentration of SARS-CoV-2 viral copies this assay can detect is 138 copies/mL. A negative result does not preclude SARS-Cov-2 infection and should not be used as the sole basis for treatment or other patient management decisions. A negative result may occur with  improper specimen collection/handling, submission of specimen other than nasopharyngeal swab, presence of viral mutation(s) within the areas targeted by this assay, and inadequate number of viral copies(<138 copies/mL). A negative result must be combined with clinical observations, patient history, and epidemiological information. The expected result is Negative.  Fact Sheet for Patients:  EntrepreneurPulse.com.au  Fact Sheet for Healthcare Providers:  IncredibleEmployment.be  This test is no t yet approved or cleared by the [place] FDA and  has been authorized for detection and/or diagnosis of SARS-CoV-2 by FDA under an Emergency Use Authorization (EUA). This EUA will remain  in effect (meaning this test can be used) for the duration of the COVID-19 declaration under Section 564(b)(1) of the Act, 21 U.S.C.section 360bbb-3(b)(1), unless the authorization is terminated  or revoked sooner.       Influenza A by PCR NEGATIVE NEGATIVE Final   Influenza B by PCR NEGATIVE NEGATIVE Final    Comment: (NOTE) The Xpert Xpress  SARS-CoV-2/FLU/RSV plus assay is intended as an aid in the diagnosis of influenza from Nasopharyngeal swab specimens and should not be used as a sole basis for treatment. Nasal washings and aspirates are unacceptable for Xpert Xpress SARS-CoV-2/FLU/RSV testing.  Fact Sheet for Patients: EntrepreneurPulse.com.au  Fact Sheet for Healthcare Providers: IncredibleEmployment.be  This test is not yet approved or  cleared by the [place] and has been authorized for detection and/or diagnosis of SARS-CoV-2 by FDA under an Emergency Use Authorization (EUA). This EUA will remain in effect (meaning this test can be used) for the duration of the COVID-19 declaration under Section 564(b)(1) of the Act, 21 U.S.C. section 360bbb-3(b)(1), unless the authorization is terminated or revoked.  Performed at [facility], [place] [address], [place], [place] [address]   Body fluid culture w Gram Stain     Status: None   Collection Time: 04/30/21 11:40 AM   Specimen: Lung, Right; Pleural Fluid  Result Value Ref Range Status   Specimen Description PLEURAL FLUID  Final   Special Requests LUNG RIGHT  Final   Gram Stain   Final    WBC PRESENT,BOTH PMN AND MONONUCLEAR NO ORGANISMS SEEN CYTOSPIN SMEAR    Culture   Final    NO GROWTH 3 DAYS Performed at [facility], [place] [address], [place], [place] [address]    Report Status 05/03/2021 FINAL  Final     Discharge Instructions:    Allergies as of 05/07/2021   No Known Allergies      Medication List     STOP taking these medications    calcium carbonate 1500 (600 Ca) MG Tabs tablet Commonly known as: OSCAL   D3-1000 25 MCG (1000 UT) tablet Generic drug: Cholecalciferol   diltiazem 120 MG 24 hr capsule Commonly known as: CARDIZEM CD   Eliquis 2.5 MG Tabs tablet Generic drug: apixaban   Fish Oil 1000 MG Caps   HYDROcodone-acetaminophen 5-325 MG tablet Commonly known as:  NORCO/VICODIN   magnesium oxide 400 MG tablet Commonly known as: MAG-OX   metoprolol succinate 50 MG 24 hr tablet Commonly known as: TOPROL-XL   NONFORMULARY OR COMPOUNDED ITEM   polyethylene glycol 17 g packet Commonly known as: MIRALAX / GLYCOLAX   senna 8.6 MG Tabs tablet Commonly known as: SENOKOT   sertraline 50 MG tablet Commonly known as: ZOLOFT       TAKE these medications    haloperidol lactate 5 MG/ML injection Commonly known as: HALDOL Inject 1 mL (5 mg total) into the vein every 6 (six) hours as needed.   LORazepam 1 MG tablet Commonly known as: ATIVAN Take 1 tablet (1 mg total) by mouth every 6 (six) hours as needed for anxiety or sleep.   morphine CONCENTRATE 10 MG/0.5ML Soln concentrated solution Take 0.25 mLs (5 mg total) by mouth every 8 (eight) hours.   morphine CONCENTRATE 10 MG/0.5ML Soln concentrated solution Take 0.25 mLs (5 mg total) by mouth every 2 (two) hours as needed for moderate pain or shortness of breath.          Time coordinating discharge: 35 min  Signed:  Geradine Girt DO  [facility] 05/07/2021, 10:22 AM

## 2021-05-18 DEATH — deceased

## 2021-05-31 ENCOUNTER — Encounter (HOSPITAL_COMMUNITY): Payer: Self-pay | Admitting: Radiology

## 2021-06-22 NOTE — Telephone Encounter (Signed)
LVM to schedule pt appt for injection.

## 2021-07-31 NOTE — Telephone Encounter (Signed)
It has been > 1 yr since last Prolia inj. Pt archived in [place] portal.

## 2021-08-05 ENCOUNTER — Ambulatory Visit: Payer: Medicare Other | Admitting: Cardiovascular Disease

## 2021-12-09 ENCOUNTER — Encounter (INDEPENDENT_AMBULATORY_CARE_PROVIDER_SITE_OTHER): Payer: Medicare Other | Admitting: Ophthalmology
